# Patient Record
Sex: Male | Born: 1945 | Race: White | Hispanic: No | Marital: Married | Smoking: Former smoker
Health system: Southern US, Community
[De-identification: ages and names within clinical notes are randomized; demographics above are authoritative.]

## PROBLEM LIST (undated history)

## (undated) DIAGNOSIS — M1990 Unspecified osteoarthritis, unspecified site: Secondary | ICD-10-CM

## (undated) DIAGNOSIS — E785 Hyperlipidemia, unspecified: Secondary | ICD-10-CM

## (undated) DIAGNOSIS — M720 Palmar fascial fibromatosis [Dupuytren]: Secondary | ICD-10-CM

## (undated) DIAGNOSIS — R1310 Dysphagia, unspecified: Secondary | ICD-10-CM

## (undated) DIAGNOSIS — R0600 Dyspnea, unspecified: Secondary | ICD-10-CM

## (undated) DIAGNOSIS — Z9289 Personal history of other medical treatment: Secondary | ICD-10-CM

## (undated) DIAGNOSIS — E538 Deficiency of other specified B group vitamins: Secondary | ICD-10-CM

## (undated) DIAGNOSIS — T7840XA Allergy, unspecified, initial encounter: Secondary | ICD-10-CM

## (undated) DIAGNOSIS — C4491 Basal cell carcinoma of skin, unspecified: Secondary | ICD-10-CM

## (undated) DIAGNOSIS — Z931 Gastrostomy status: Secondary | ICD-10-CM

## (undated) DIAGNOSIS — K219 Gastro-esophageal reflux disease without esophagitis: Secondary | ICD-10-CM

## (undated) DIAGNOSIS — C169 Malignant neoplasm of stomach, unspecified: Secondary | ICD-10-CM

## (undated) DIAGNOSIS — R9431 Abnormal electrocardiogram [ECG] [EKG]: Secondary | ICD-10-CM

## (undated) DIAGNOSIS — Z923 Personal history of irradiation: Secondary | ICD-10-CM

## (undated) DIAGNOSIS — Z85828 Personal history of other malignant neoplasm of skin: Secondary | ICD-10-CM

## (undated) DIAGNOSIS — C61 Malignant neoplasm of prostate: Secondary | ICD-10-CM

## (undated) DIAGNOSIS — R011 Cardiac murmur, unspecified: Secondary | ICD-10-CM

## (undated) DIAGNOSIS — H269 Unspecified cataract: Secondary | ICD-10-CM

## (undated) DIAGNOSIS — K269 Duodenal ulcer, unspecified as acute or chronic, without hemorrhage or perforation: Secondary | ICD-10-CM

## (undated) DIAGNOSIS — C439 Malignant melanoma of skin, unspecified: Secondary | ICD-10-CM

## (undated) DIAGNOSIS — D649 Anemia, unspecified: Secondary | ICD-10-CM

## (undated) HISTORY — DX: Unspecified cataract: H26.9

## (undated) HISTORY — DX: Palmar fascial fibromatosis (dupuytren): M72.0

## (undated) HISTORY — DX: Hyperlipidemia, unspecified: E78.5

## (undated) HISTORY — PX: VASECTOMY: SHX75

## (undated) HISTORY — DX: Abnormal electrocardiogram (ECG) (EKG): R94.31

## (undated) HISTORY — PX: ROTATOR CUFF REPAIR: SHX139

## (undated) HISTORY — PX: KNEE ARTHROSCOPY: SUR90

## (undated) HISTORY — PX: CATARACT EXTRACTION, BILATERAL: SHX1313

## (undated) HISTORY — PX: GASTRECTOMY: SHX58

## (undated) HISTORY — DX: Deficiency of other specified B group vitamins: E53.8

## (undated) HISTORY — PX: UPPER GASTROINTESTINAL ENDOSCOPY: SHX188

## (undated) HISTORY — DX: Duodenal ulcer, unspecified as acute or chronic, without hemorrhage or perforation: K26.9

## (undated) HISTORY — PX: COLONOSCOPY: SHX174

---

## 1970-10-06 DIAGNOSIS — K269 Duodenal ulcer, unspecified as acute or chronic, without hemorrhage or perforation: Secondary | ICD-10-CM

## 1970-10-06 HISTORY — DX: Duodenal ulcer, unspecified as acute or chronic, without hemorrhage or perforation: K26.9

## 1996-10-06 HISTORY — PX: TRIGGER FINGER RELEASE: SHX641

## 2004-12-17 ENCOUNTER — Ambulatory Visit: Payer: Self-pay | Admitting: Gastroenterology

## 2005-01-03 ENCOUNTER — Ambulatory Visit: Payer: Self-pay | Admitting: Gastroenterology

## 2007-02-24 ENCOUNTER — Encounter: Payer: Self-pay | Admitting: Internal Medicine

## 2007-02-24 ENCOUNTER — Ambulatory Visit: Payer: Self-pay | Admitting: Internal Medicine

## 2007-03-04 ENCOUNTER — Ambulatory Visit: Payer: Self-pay | Admitting: Internal Medicine

## 2007-03-09 ENCOUNTER — Encounter: Payer: Self-pay | Admitting: Internal Medicine

## 2007-03-11 ENCOUNTER — Encounter: Payer: Self-pay | Admitting: Internal Medicine

## 2007-03-25 ENCOUNTER — Ambulatory Visit: Payer: Self-pay

## 2007-03-25 ENCOUNTER — Encounter: Payer: Self-pay | Admitting: Internal Medicine

## 2007-04-29 ENCOUNTER — Ambulatory Visit: Payer: Self-pay | Admitting: Internal Medicine

## 2007-07-13 ENCOUNTER — Ambulatory Visit: Payer: Self-pay | Admitting: Internal Medicine

## 2007-08-23 ENCOUNTER — Ambulatory Visit: Payer: Self-pay | Admitting: Internal Medicine

## 2007-08-23 DIAGNOSIS — E782 Mixed hyperlipidemia: Secondary | ICD-10-CM

## 2008-09-19 ENCOUNTER — Ambulatory Visit: Payer: Self-pay | Admitting: Internal Medicine

## 2008-11-27 ENCOUNTER — Ambulatory Visit: Payer: Self-pay | Admitting: Internal Medicine

## 2008-11-27 LAB — CONVERTED CEMR LAB
AST: 23 units/L (ref 0–37)
Albumin: 4 g/dL (ref 3.5–5.2)
Alkaline Phosphatase: 52 units/L (ref 39–117)
BUN: 13 mg/dL (ref 6–23)
Bilirubin, Direct: 0.1 mg/dL (ref 0.0–0.3)
Chloride: 106 meq/L (ref 96–112)
Eosinophils Absolute: 0.1 10*3/uL (ref 0.0–0.7)
Eosinophils Relative: 2.3 % (ref 0.0–5.0)
GFR calc Af Amer: 126 mL/min
GFR calc non Af Amer: 104 mL/min
HDL: 43.1 mg/dL (ref >39.0)
MCV: 89.5 fL (ref 78.0–100.0)
Monocytes Relative: 8.7 % (ref 3.0–12.0)
Neutrophils Relative %: 55.3 % (ref 43.0–77.0)
Platelets: 144 10*3/uL — ABNORMAL LOW (ref 150–400)
Potassium: 4.2 meq/L (ref 3.5–5.1)
RDW: 13.1 % (ref 11.5–14.6)
Sodium: 140 meq/L (ref 135–145)
Total Bilirubin: 1 mg/dL (ref 0.3–1.2)
Total CHOL/HDL Ratio: 3.5
Triglycerides: 72 mg/dL (ref 0–149)
VLDL: 14 mg/dL (ref 0–40)
WBC: 5.4 10*3/uL (ref 4.5–10.5)

## 2008-12-04 ENCOUNTER — Ambulatory Visit: Payer: Self-pay | Admitting: Internal Medicine

## 2008-12-04 DIAGNOSIS — R7309 Other abnormal glucose: Secondary | ICD-10-CM

## 2008-12-04 DIAGNOSIS — Z86010 Personal history of colonic polyps: Secondary | ICD-10-CM

## 2008-12-04 DIAGNOSIS — R030 Elevated blood-pressure reading, without diagnosis of hypertension: Secondary | ICD-10-CM

## 2008-12-04 LAB — CONVERTED CEMR LAB
Cholesterol, target level: 200 mg/dL
HDL goal, serum: 40 mg/dL
LDL Goal: 100 mg/dL

## 2008-12-05 ENCOUNTER — Encounter: Payer: Self-pay | Admitting: Internal Medicine

## 2009-09-24 ENCOUNTER — Ambulatory Visit: Payer: Self-pay | Admitting: Family

## 2009-12-21 ENCOUNTER — Ambulatory Visit: Payer: Self-pay | Admitting: Internal Medicine

## 2009-12-21 DIAGNOSIS — R9431 Abnormal electrocardiogram [ECG] [EKG]: Secondary | ICD-10-CM | POA: Insufficient documentation

## 2009-12-21 DIAGNOSIS — R42 Dizziness and giddiness: Secondary | ICD-10-CM | POA: Insufficient documentation

## 2009-12-21 DIAGNOSIS — H9190 Unspecified hearing loss, unspecified ear: Secondary | ICD-10-CM | POA: Insufficient documentation

## 2010-04-01 ENCOUNTER — Ambulatory Visit: Payer: Self-pay | Admitting: Family Medicine

## 2010-04-01 DIAGNOSIS — J069 Acute upper respiratory infection, unspecified: Secondary | ICD-10-CM | POA: Insufficient documentation

## 2010-04-03 ENCOUNTER — Telehealth: Payer: Self-pay | Admitting: Family Medicine

## 2010-04-15 ENCOUNTER — Ambulatory Visit: Payer: Self-pay | Admitting: Internal Medicine

## 2010-11-03 LAB — CONVERTED CEMR LAB
AST: 19 units/L (ref 0–37)
Albumin: 4.1 g/dL (ref 3.5–5.2)
Alkaline Phosphatase: 54 units/L (ref 39–117)
BUN: 11 mg/dL (ref 6–23)
Basophils Absolute: 0 10*3/uL (ref 0.0–0.1)
Basophils Relative: 0.5 % (ref 0.0–1.0)
Basophils Relative: 0.8 % (ref 0.0–3.0)
Bilirubin, Direct: 0.1 mg/dL (ref 0.0–0.3)
Bilirubin, Direct: 0.2 mg/dL (ref 0.0–0.3)
CO2: 30 meq/L (ref 19–32)
CO2: 30 meq/L (ref 19–32)
Calcium: 8.8 mg/dL (ref 8.4–10.5)
Chloride: 106 meq/L (ref 96–112)
Chloride: 107 meq/L (ref 96–112)
Cholesterol: 149 mg/dL (ref 0–200)
Creatinine, Ser: 0.6 mg/dL (ref 0.4–1.5)
Creatinine, Ser: 0.8 mg/dL (ref 0.4–1.5)
Eosinophils Absolute: 0.1 10*3/uL (ref 0.0–0.7)
Eosinophils Relative: 1.8 % (ref 0.0–5.0)
Glucose, Bld: 109 mg/dL — ABNORMAL HIGH (ref 70–99)
Glucose, Bld: 81 mg/dL (ref 70–99)
HCT: 45.2 % (ref 39.0–52.0)
HDL: 45.6 mg/dL (ref >39.00)
Lymphocytes Relative: 30.6 % (ref 12.0–46.0)
MCHC: 33.8 g/dL (ref 30.0–36.0)
MCV: 89.8 fL (ref 78.0–100.0)
MCV: 91.1 fL (ref 78.0–100.0)
Monocytes Absolute: 0.4 10*3/uL (ref 0.1–1.0)
Neutrophils Relative %: 56.6 % (ref 43.0–77.0)
Neutrophils Relative %: 57.1 % (ref 43.0–77.0)
PSA: 1.23 ng/mL (ref 0.10–4.00)
PSA: 1.85 ng/mL (ref 0.10–4.00)
RBC: 5.03 M/uL (ref 4.22–5.81)
RDW: 12.8 % (ref 11.5–14.6)
RDW: 13.3 % (ref 11.5–14.6)
Sodium: 141 meq/L (ref 135–145)
Total Bilirubin: 0.9 mg/dL (ref 0.3–1.2)
Total Protein: 6.7 g/dL (ref 6.0–8.3)
Triglycerides: 46 mg/dL (ref 0.0–149.0)
WBC: 5.3 10*3/uL (ref 4.5–10.5)

## 2010-11-05 NOTE — Assessment & Plan Note (Signed)
Summary: cpx/kdc   Vital Signs:  Patient profile:   65 year old male Weight:      211 pounds Temp:     98.5 degrees F oral Pulse rate:   68 / minute Resp:     16 per minute BP sitting:   110 / 78  (left arm)  Vitals Entered By: Jeremy Johann CMA (December 21, 2009 1:06 PM) CC: cpx,fasting Comments REVIEWED MED LIST, PATIENT AGREED DOSE AND INSTRUCTION CORRECT    CC:  cpx and fasting.  History of Present Illness: Mr . James [name] is here for a physical; he has occasional lightheadedness while sitting @ desk , lasting 3-4 seconds, never with exertion. Preventive Health Care Interventions discussed.  Allergies: 1)  ! Codeine  Past History:  Past Medical History: DUD with bleed 1969; Hyperlipidemia: NMR 2008: LDL 148(1859/1154), HDL 45, TG 93. LDL goal = < 110, ideally < 75 Colonic polyps, hx of NS ST -T EKG changes; NEGATIVE Nuclear Stress Test 03/2007  Past Surgical History: trigger finger release surgery 1998; Rotator cuff repair arthroscopy of R  knee Colon polypectomy X 1, [facility]   Family History: bro: MM;F DM,MI @ 76,?  liver issues related to alcohol; M alcoholism  Social History: 3-5 drinks/week;2-3 cups coffee/QD;occa tea;diet cola 1-2/day;Advil 2 every other day; Occupation:Business Owner Former Smoker, quit 1968 Regular exercise-yes: tennis 3X/week [employer]; graduate of [employer]  Review of Systems General:  Denies chills, fatigue, fever, sleep disorder, and sweats. Eyes:  Denies blurring, double vision, and vision loss-both eyes; Cataract surgery to be scheduled by Dr Nile Riggs. ENT:  Complains of decreased hearing; denies difficulty swallowing and hoarseness. CV:  Denies bluish discoloration of lips or nails, chest pain or discomfort, difficulty breathing at night, difficulty breathing while lying down, fatigue, leg cramps with exertion, near fainting, palpitations, shortness of breath with exertion, and swelling of hands; Occasional edema. Resp:   Denies cough, shortness of breath, sputum productive, and wheezing. GI:  Denies abdominal pain, bloody stools, dark tarry stools, and indigestion. GU:  Denies discharge, dysuria, and hematuria. MS:  Denies joint pain, joint redness, joint swelling, low back pain, mid back pain, and thoracic pain. Derm:  Denies changes in nail beds, dryness, hair loss, and lesion(s). Neuro:  Denies brief paralysis, disturbances in coordination, falling down, headaches, numbness, poor balance, sensation of room spinning, tingling, visual disturbances, and weakness. Psych:  Denies anxiety and depression. Endo:  Denies cold intolerance, excessive hunger, excessive thirst, excessive urination, and heat intolerance. Heme:  Denies abnormal bruising. Allergy:  Complains of itching eyes, seasonal allergies, and sneezing; OTC meds as needed .  Physical Exam  General:  well-nourished; alert,appropriate and cooperative throughout examination Head:  Normocephalic and atraumatic without obvious abnormalities. No apparent alopecia Eyes:  No corneal or conjunctival inflammation noted.  Perrla. Funduscopic exam benign, without hemorrhages, exudates or papilledema. Cataract OD  Ears:  External ear exam shows no significant lesions or deformities.  Otoscopic examination reveals clear canals, tympanic membranes are intact bilaterally without bulging, retraction, inflammation or discharge. Hearing is grossly  decreased Nose:  External nasal examination shows no deformity or inflammation. Nasal mucosa are pink and moist without lesions or exudates. Mouth:  Oral mucosa and oropharynx without lesions or exudates.  Teeth in good repair. Neck:  No deformities, masses, or tenderness noted. Lungs:  Normal respiratory effort, chest expands symmetrically. Lungs are clear to auscultation, no crackles or wheezes. Heart:  normal rate. Occasional premature beat & intermittent  grade 1 /6  systolic murmur @ apex .   Abdomen:  Bowel sounds  positive,abdomen soft and non-tender without masses, organomegaly or hernias noted. Rectal:  No external abnormalities noted. Normal sphincter tone. No rectal masses or tenderness. Genitalia:  Testes bilaterally descended without nodularity, tenderness or masses. No scrotal masses or lesions. No penis lesions or urethral discharge. Prostate:  Prostate gland firm and smooth, no enlargement, nodularity, tenderness, mass, asymmetry or induration. Msk:  No deformity or scoliosis noted of thoracic or lumbar spine.   Pulses:  R and L carotid,radial,dorsalis pedis and posterior tibial pulses are full and equal bilaterally Extremities:  No clubbing, cyanosis, edema, or deformity noted with normal full range of motion of all joints.   Neurologic:  alert & oriented X3 and DTRs symmetrical and normal.   Skin:  Solar changes Cervical Nodes:  No lymphadenopathy noted Axillary Nodes:  No palpable lymphadenopathy Psych:  memory intact for recent and remote, normally interactive, and good eye contact.     Impression & Recommendations:  Problem # 1:  ROUTINE GENERAL MEDICAL EXAM@HEALTH  CARE FACL (ICD-V70.0)  Orders: EKG w/ Interpretation (93000) Venipuncture (16109) TLB-Lipid Panel (80061-LIPID) TLB-BMP (Basic Metabolic Panel-BMET) (80048-METABOL) TLB-CBC Platelet - w/Differential (85025-CBCD) TLB-Hepatic/Liver Function Pnl (80076-HEPATIC) TLB-TSH (Thyroid Stimulating Hormone) (84443-TSH) TLB-PSA (Prostate Specific Antigen) (84153-PSA)  Problem # 2:  DIZZINESS (ICD-780.4)  @ rest ; in context of PACs & NS ST-T changes  and increased stimulants  Orders: Venipuncture (60454)  Problem # 3:  HEARING DEFICIT (ICD-389.9)  Problem # 4:  HYPERLIPIDEMIA (ICD-272.2)  His updated medication list for this problem includes:    Pravastatin Sodium 40 Mg Tabs (Pravastatin sodium) .[name]... 1 qhs  Orders: Venipuncture (09811) TLB-Lipid Panel (80061-LIPID)  Problem # 5:  COLONIC POLYPS, HX OF (ICD-V12.72)    F/U as per [facility]  Orders: Venipuncture (91478)  Problem # 6:  NONSPECIFIC ABNORMAL ELECTROCARDIOGRAM (ICD-794.31) minimal progression vs 2008 Orders: EKG w/ Interpretation (93000)  Complete Medication List: 1)  Pravastatin Sodium 40 Mg Tabs (Pravastatin sodium) .[name].. 1 qhs  Other Orders: Tdap => 43yrs IM (29562) Admin 1st Vaccine (13086) Admin 1st Vaccine [facility]) [id number])  Patient Instructions: 1)  Slowly wean stimulants as discussed. 2)  Schedule a colonoscopy as per [facility]  to help detect colon cancer.   Tetanus/Td Vaccine    Vaccine Type: Tdap    Site: right deltoid    Mfr: GlaxoSmithKline    Dose: 0.5 ml    Route: IM    Given by: Jeremy Johann CMA    Exp. Date: 12/01/2011    Lot #: [id number]    VIS given: 08/24/07 version given December 21, 2009.

## 2010-11-05 NOTE — Progress Notes (Signed)
Summary: James [name]  Phone Note Call from Patient Call back at [facility] Phone [phone]   Caller: Patient Summary of Call: PT WAS SEEN BY DR Beverely Low ON MONDAY. WAS TOLD TO CALL BACK IF SYPTOMS DIDNT IMPROVE. HE NOW STATES THAT HE IS  WORSE.HE COMPLAINS OF DIZZINESS,CONGESTION, SORE THROAT,CANT EAT,HEADACHE,CANT SLEEP. PLEASE CONTACT PATIENT AT [phone]. Initial call taken by: Lavell Islam,  April 03, 2010 8:25 AM  Follow-up for Phone Call        left message to call office......[name] KitchenFelecia Deloach CMA  April 03, 2010 12:06 PM   Additional Follow-up for Phone Call Additional follow up Details #1::        sxs still sound consistent w/ viral illness but given new onset dizziness and HA along w/ congestion may be sinus infxn.  if dizziness and HA severe will need evaluation either here or at [facility] or ER.  also need more info as to why pt can't sleep and eat.  if pt can clarify some of his sxs will consider calling in abx.  if sxs remain vague will need OV. Additional Follow-up by: Neena Rhymes MD,  April 03, 2010 12:17 PM    Additional Follow-up for Phone Call Additional follow up Details #2::    spoke to pt, pt c/o dull HA, sore throat, and a cough. Pt denies any fever, SOB, or dizziness. pt state that due to sore throat he unable able to swallow food which has cause him not to be able to eat solid foods. Pt also states that he is having difficulty sleeping due the cough which is keeping and awaking him up at night...........................[name] KitchenFelecia Deloach CMA  April 03, 2010 12:40 PM    Additional Follow-up for Phone Call Additional follow up Details #3:: Details for Additional Follow-up Action Taken: pt is allergic to codeine so other than Delsym and Tessalon (which he already has) there isn't much to do for the cough.  Sore throat will improve w/ regular use of ibuprofen (will also help HA).  if he's not having facial pain/tooth pain it is unlikely to be sinus infxn.  at this point he should  continue the cough meds he has, use the ibuprofen regularly.  it has only been 48 hrs since he was seen and 5 days since sxs first started.  still likely viral.  should start to improve soon and if not will address the antibiotic decision at that time (typically 7 days or more)  pt aware.....................[name] KitchenFelecia Deloach CMA  April 03, 2010 1:22 PM  Additional Follow-up by: Neena Rhymes MD,  April 03, 2010 1:09 PM

## 2010-11-05 NOTE — Assessment & Plan Note (Signed)
Summary: SHINGLES SHOT--PER CHRAE, HAS ONE WITH HIS NAME ON IT///SPH  Nurse Visit   Allergies: 1)  ! Codeine  Immunizations Administered:  Zostavax # 1:    Vaccine Type: Zostavax    Site: Left Arm    Mfr: Merck    Dose: 0.62mL    Route: Arthur    Given by: Shonna Chock    Exp. Date: 05/01/2011    Lot #: [id number]    VIS given: 07/18/05 given April 15, 2010.  Orders Added: 1)  Zoster (Shingles) Vaccine Live [90736] 2)  Admin 1st Vaccine [phone]

## 2010-11-05 NOTE — Assessment & Plan Note (Signed)
Summary: chest cold//kn   Vital Signs:  Patient profile:   65 year old male Weight:      209 pounds O2 Sat:      93 % on Room air Temp:     97.0 degrees F oral Pulse rate:   62 / minute BP sitting:   116 / 76  (left arm)  Vitals Entered By: Doristine Devoid (April 01, 2010 11:12 AM)  O2 Flow:  Room air CC: cough and chest congestion xsat. no appetite    History of Present Illness: 65 yo man here today w/ cough and chest congestion.  sxs started on Saturday after spending time w/ sick contact.  no fevers.  + sore throat.  + hacking cough 'all night, can't sleep'.  chest discomfort w/ coughing.  + fatigue, 'i feel like doo-doo'.  cough is intermittantly productive- 'a good shade of green'.  no ear pain, mild facial pressure.  + HA.  Taking Mucinex DM.  Current Medications (verified): 1)  Pravastatin Sodium 40 Mg  Tabs (Pravastatin Sodium) .[name].. 1 Qhs  Allergies (verified): 1)  ! Codeine  Review of Systems      See HPI  Physical Exam  General:  well-nourished; alert,appropriate and cooperative throughout examination Head:  Normocephalic and atraumatic without obvious abnormalities. No apparent alopecia.  no TTP over sinuses Eyes:  no injxn or inflammation Ears:  External ear exam shows no significant lesions or deformities.  Otoscopic examination reveals clear canals, tympanic membranes are intact bilaterally without bulging, retraction, inflammation or discharge. Hearing is grossly  decreased Nose:  clear rhinorrhea Mouth:  Oral mucosa and oropharynx without lesions or exudates. Neck:  No deformities, masses, or tenderness noted. Lungs:  Normal respiratory effort, chest expands symmetrically. Lungs are clear to auscultation, no crackles or wheezes.  1 dry cough during visit Heart:  normal rate. grade 1 /6 systolic murmur @ apex .   Cervical Nodes:  No lymphadenopathy noted   Impression & Recommendations:  Problem # 1:  URI (ICD-465.9) Assessment New  pt's sxs consistent w/  viral illness.  no bacterial infxn seen on exam.  reviewed supportive care and red flags that should prompt return.  Pt expresses understanding and is in agreement w/ this plan. His updated medication list for this problem includes:    Tessalon 200 Mg Caps (Benzonatate) .[name]... Take one capsule by mouth three times a day as needed for cough  Orders: Prescription Created Electronically [phone])  Complete Medication List: 1)  Pravastatin Sodium 40 Mg Tabs (Pravastatin sodium) .[name].. 1 qhs 2)  Tessalon 200 Mg Caps (Benzonatate) .... Take one capsule by mouth three times a day as needed for cough  Patient Instructions: 1)  Please call if no improvement by Friday, or if at anytime worsening 2)  Continue the Mucinex DM, add the Tessalon for cough 3)  Tylenol/ibuprofen as needed for pain or fever 4)  Drink plenty of fluids and get lots of rest 5)  Call with any questions or concerns 6)  Hang in there! Prescriptions: TESSALON 200 MG CAPS (BENZONATATE) Take one capsule by mouth three times a day as needed for cough  #60 x 0   Entered and Authorized by:   Neena Rhymes MD   Signed by:   Neena Rhymes MD on 04/01/2010   Method used:   Electronically to        [facility] [address] [id number]* (retail)       [address]  [place], [place]  [address]       Ph: [phone]       Fax: [phone]   RxID:   [id number]

## 2010-11-06 ENCOUNTER — Encounter: Payer: Self-pay | Admitting: Internal Medicine

## 2010-12-27 ENCOUNTER — Ambulatory Visit (INDEPENDENT_AMBULATORY_CARE_PROVIDER_SITE_OTHER): Payer: Medicare Other | Admitting: Internal Medicine

## 2010-12-27 ENCOUNTER — Encounter: Payer: Self-pay | Admitting: Internal Medicine

## 2010-12-27 VITALS — BP 106/70 | HR 60 | Temp 97.5°F | Resp 14 | Ht 67.5 in | Wt 214.0 lb

## 2010-12-27 DIAGNOSIS — Z23 Encounter for immunization: Secondary | ICD-10-CM

## 2010-12-27 DIAGNOSIS — E785 Hyperlipidemia, unspecified: Secondary | ICD-10-CM

## 2010-12-27 DIAGNOSIS — Z0000 Encounter for general adult medical examination without abnormal findings: Secondary | ICD-10-CM

## 2010-12-27 DIAGNOSIS — D126 Benign neoplasm of colon, unspecified: Secondary | ICD-10-CM

## 2010-12-27 DIAGNOSIS — R9431 Abnormal electrocardiogram [ECG] [EKG]: Secondary | ICD-10-CM

## 2010-12-27 LAB — HEPATIC FUNCTION PANEL
ALT: 12 U/L (ref 0–53)
AST: 24 U/L (ref 0–37)
Albumin: 4.3 g/dL (ref 3.5–5.2)
Alkaline Phosphatase: 50 U/L (ref 39–117)
Total Bilirubin: 0.8 mg/dL (ref 0.3–1.2)

## 2010-12-27 LAB — BASIC METABOLIC PANEL
Calcium: 9 mg/dL (ref 8.4–10.5)
Creatinine, Ser: 0.8 mg/dL (ref 0.4–1.5)
GFR: 99.89 mL/min (ref >60.00)
Sodium: 142 mEq/L (ref 135–145)

## 2010-12-27 LAB — CBC WITH DIFFERENTIAL/PLATELET
Basophils Absolute: 0 10*3/uL (ref 0.0–0.1)
HCT: 44.7 % (ref 39.0–52.0)
Lymphs Abs: 1.9 10*3/uL (ref 0.7–4.0)
MCV: 89.7 fl (ref 78.0–100.0)
Monocytes Absolute: 0.5 10*3/uL (ref 0.1–1.0)
Neutrophils Relative %: 53.5 % (ref 43.0–77.0)
Platelets: 158 10*3/uL (ref 150.0–400.0)
RDW: 13.9 % (ref 11.5–14.6)
WBC: 5.3 10*3/uL (ref 4.5–10.5)

## 2010-12-27 LAB — LIPID PANEL
HDL: 43.2 mg/dL (ref >39.00)
Total CHOL/HDL Ratio: 4
Triglycerides: 45 mg/dL (ref 0.0–149.0)

## 2010-12-27 LAB — TSH: TSH: 2.5 u[IU]/mL (ref 0.35–5.50)

## 2010-12-27 NOTE — Progress Notes (Signed)
  Subjective:    Patient ID: James [name], male    DOB: 1946/01/24, 65 y.o.   MRN: [id number]  HPI    Review of Systems     Objective:   Physical Exam        Assessment & Plan:

## 2010-12-27 NOTE — Progress Notes (Signed)
  Subjective:    Patient ID: James [name], male    DOB: 1946-06-06, 65 y.o.   MRN: [id number]  HPI  James [name] is here for a Medicare Wellness Exam; Diagnoses reviewed & chart updated.    Review of Systems     Objective:   Physical Exam        Assessment & Plan:

## 2010-12-27 NOTE — Progress Notes (Signed)
  Subjective:    Patient ID: James [name], male    DOB: March 24, 1946, 65 y.o.   MRN: [id number]  HPI he is here for his Medicare wellness visit; he is essentially asymptomatic. He has recovered from a recent respiratory tract infection which was associated with a prolonged cough.    the Medicare wellness questionnaire was reviewed. Diagnoses were reviewed and the chart was updated.   Social data includes congestion of less than one alcoholic beverage daily. He is an ex-smoker having smoked for 5 years during the period 1964-1969. He smoked less than one pack per day.  His last foreign travel was to [place] in 2002. Significantly he was in the in 1968 - 68.  He drinks 2-3 cups of coffee a day and 3 diet Cokes a day. Caffeine as a potential trigger for reflux was discussed.   he exercises at least 3 times a week playing tennis and golf ; this encompasses greater than 60 minutes per session.   He has no limitations to activities of daily living. Has no fall risk or balance issues.    he has noted decreased auditory acuity particularly in crowded rooms. On direct testing hearing was decreased to whisper at 6 feet  Bilaterally.   Audiology assessment is being considered ; clinically this is indicated.   mental status testing was completed. He is oriented x3 ; memory and recall is excellent; mood and affect are normal.  He denies any significant depression.    Review of Systems  Constitutional: Negative for fever, activity change, fatigue and unexpected weight change.  HENT: Negative for congestion, sore throat and postnasal drip.   Eyes: Negative for visual disturbance.  Respiratory: Negative for cough, chest tightness, shortness of breath and wheezing.   Cardiovascular: Negative for chest pain, palpitations and leg swelling.  Gastrointestinal: Negative for abdominal pain, diarrhea, constipation, blood in stool and anal bleeding.  Genitourinary: Negative for dysuria and hematuria.    Musculoskeletal: Negative for myalgias and arthralgias.  Skin: Negative for rash.  Neurological: Negative for dizziness, syncope, light-headedness and numbness.  Hematological: Negative for adenopathy. Does not bruise/bleed easily.  Psychiatric/Behavioral: Negative.    despite the recent pressure for infection he has no active respiratory symptoms related to the upper or lower respiratory tracts.     Objective:   Physical Exam  Constitutional: He appears well-nourished.  HENT:  Head: Normocephalic.  Mouth/Throat: Oropharynx is clear and moist.        There is mild septal deviation to the right. As noted there is decreased auditory acuity to whisper at 6 feet bilaterally.  Eyes: Conjunctivae are normal. Pupils are equal, round, and reactive to light.  Neck: No thyromegaly present.  Cardiovascular: Normal rate, regular rhythm and intact distal pulses.  Exam reveals no gallop and no friction rub.   Murmur heard.       A grade 1 systolic murmur is present ; the rhythm is minimally irregular  Pulmonary/Chest: Effort normal and breath sounds normal.  Abdominal: Soft. He exhibits no distension and no mass. There is no tenderness.  Genitourinary: Rectum normal, prostate normal and penis normal.  Musculoskeletal: Normal range of motion. He exhibits no edema.  Lymphadenopathy:    He has no cervical adenopathy.  Neurological: He is alert. He has normal reflexes.  Skin: Skin is warm and dry. No rash noted.  Psychiatric: He has a normal mood and affect. Thought content normal.          Assessment & Plan:

## 2010-12-27 NOTE — Progress Notes (Signed)
  Subjective:    Patient ID: James [name], male    DOB: 16-Sep-1946, 65 y.o.   MRN: [id number]  HPI    Review of Systems     Objective:   Physical Exam        Assessment & Plan:   #1 Medicare medical wellness exam reveals no significant issues    #2 decreased auditory acuity ; audiology referral is appropriate if desired.    Plan : additional recommendations pending return of  laboratory studies.

## 2010-12-27 NOTE — Progress Notes (Signed)
  Subjective:    Patient ID: James [name], male    DOB: 07/21/1946, 65 y.o.   MRN: [id number]  HPI    Review of Systems     Objective:   Physical Exam        Assessment & Plan:   

## 2010-12-27 NOTE — Progress Notes (Signed)
  Subjective:    Patient ID: James [name], male    DOB: 03/18/1946, 65 y.o.   MRN: [id number]  HPI    Review of Systems     Objective:   Physical Exam        Assessment & Plan:   

## 2010-12-27 NOTE — Progress Notes (Signed)
  Subjective:    Patient ID: James [name], male    DOB: 12/27/1945, 65 y.o.   MRN: [id number]  HPI    Review of Systems     Objective:   Physical Exam        Assessment & Plan:   

## 2010-12-27 NOTE — Patient Instructions (Signed)
Please call if referral needed to see   Audiologist.    additional recommendations are pending the outstanding lab results

## 2010-12-27 NOTE — Progress Notes (Signed)
  Subjective:    Patient ID: James [name], male    DOB: 1946/05/24, 65 y.o.   MRN: [id number]  HPI    Review of Systems     Objective:   Physical Exam  Skin:        There are scattered keratoses over his back ; no suspicious lesions are present.          Assessment & Plan:

## 2011-02-21 ENCOUNTER — Other Ambulatory Visit: Payer: Self-pay | Admitting: Internal Medicine

## 2011-02-21 NOTE — Telephone Encounter (Signed)
Lipid/Hep 272.4/995.20  

## 2011-02-27 ENCOUNTER — Telehealth: Payer: Self-pay | Admitting: Internal Medicine

## 2011-02-27 MED ORDER — PRAVASTATIN SODIUM 40 MG PO TABS: 40.0000 mg | ORAL_TABLET | Freq: Every day | ORAL | Status: DC

## 2011-02-27 NOTE — Telephone Encounter (Signed)
No additional labs needed, resubmitted rx for year supply

## 2011-03-04 ENCOUNTER — Other Ambulatory Visit: Payer: Self-pay | Admitting: Otolaryngology

## 2011-03-14 ENCOUNTER — Other Ambulatory Visit: Payer: Self-pay | Admitting: Dermatology

## 2011-03-14 ENCOUNTER — Ambulatory Visit
Admission: RE | Admit: 2011-03-14 | Discharge: 2011-03-14 | Disposition: A | Discharge status: Home / Self Care | Payer: Medicare Other | Source: Ambulatory Visit | Attending: Otolaryngology | Admitting: Otolaryngology

## 2011-09-12 ENCOUNTER — Encounter: Payer: Self-pay | Admitting: Family Medicine

## 2011-09-12 ENCOUNTER — Ambulatory Visit (INDEPENDENT_AMBULATORY_CARE_PROVIDER_SITE_OTHER): Payer: Medicare Other | Admitting: Family Medicine

## 2011-09-12 VITALS — BP 114/76 | HR 65 | Temp 99.1°F | Wt 214.0 lb

## 2011-09-12 DIAGNOSIS — J40 Bronchitis, not specified as acute or chronic: Secondary | ICD-10-CM

## 2011-09-12 MED ORDER — AZITHROMYCIN 250 MG PO TABS: ORAL_TABLET | ORAL | Status: AC

## 2011-09-12 NOTE — Patient Instructions (Signed)

## 2011-09-12 NOTE — Progress Notes (Signed)
  Subjective:     James [name] is a 65 y.o. male here for evaluation of a cough. Onset of symptoms was 2 weeks ago. Symptoms have been gradually worsening since that time. The cough is productive and is aggravated by exercise and reclining position. Associated symptoms include: sputum production and wheezing. Patient does not have a history of asthma. Patient does not have a history of environmental allergens. Patient has not traveled recently. Patient does have a history of smoking. Patient has not had a previous chest x-ray. Patient has not had a PPD done.  The following portions of the patient's history were reviewed and updated as appropriate: allergies, current medications, past family history, past medical history, past social history, past surgical history and problem list.  Review of Systems Pertinent items are noted in HPI.    Objective:    Oxygen saturation 95% on room air BP 114/76  Pulse 65  Temp(Src) 99.1 F (37.3 C) (Oral)  Wt 214 lb (97.07 kg)  SpO2 95% General appearance: alert, cooperative, appears stated age and no distress Ears: normal TM's and external ear canals both ears Nose: Nares normal. Septum midline. Mucosa normal. No drainage or sinus tenderness. Throat: lips, mucosa, and tongue normal; teeth and gums normal Neck: no adenopathy, supple, symmetrical, trachea midline and thyroid not enlarged, symmetric, no tenderness/mass/nodules Lungs: rhonchi bilaterally Heart: regular rate and rhythm, S1, S2 normal, no murmur, click, rub or gallop    Assessment:    Acute Bronchitis    Plan:    Antibiotics per medication orders. Antitussives per medication orders. Avoid exposure to tobacco smoke and fumes. Call if shortness of breath worsens, blood in sputum, change in character of cough, development of fever or chills, inability to maintain nutrition and hydration. Avoid exposure to tobacco smoke and fumes.

## 2012-01-06 ENCOUNTER — Ambulatory Visit (INDEPENDENT_AMBULATORY_CARE_PROVIDER_SITE_OTHER): Payer: Medicare Other | Admitting: Internal Medicine

## 2012-01-06 ENCOUNTER — Encounter: Payer: Self-pay | Admitting: Internal Medicine

## 2012-01-06 VITALS — BP 124/82 | HR 64 | Temp 98.2°F | Resp 12 | Ht 67.75 in | Wt 208.0 lb

## 2012-01-06 DIAGNOSIS — R9431 Abnormal electrocardiogram [ECG] [EKG]: Secondary | ICD-10-CM

## 2012-01-06 DIAGNOSIS — Z860100 Personal history of colon polyps, unspecified: Secondary | ICD-10-CM

## 2012-01-06 DIAGNOSIS — Z0000 Encounter for general adult medical examination without abnormal findings: Secondary | ICD-10-CM

## 2012-01-06 DIAGNOSIS — E782 Mixed hyperlipidemia: Secondary | ICD-10-CM

## 2012-01-06 DIAGNOSIS — R7309 Other abnormal glucose: Secondary | ICD-10-CM

## 2012-01-06 DIAGNOSIS — R030 Elevated blood-pressure reading, without diagnosis of hypertension: Secondary | ICD-10-CM

## 2012-01-06 LAB — HEPATIC FUNCTION PANEL
ALT: 12 U/L (ref 0–53)
Bilirubin, Direct: 0 mg/dL (ref 0.0–0.3)
Total Bilirubin: 0.6 mg/dL (ref 0.3–1.2)

## 2012-01-06 LAB — BASIC METABOLIC PANEL
BUN: 16 mg/dL (ref 6–23)
Calcium: 9 mg/dL (ref 8.4–10.5)
Chloride: 103 mEq/L (ref 96–112)
Creatinine, Ser: 0.8 mg/dL (ref 0.4–1.5)
GFR: 98.19 mL/min (ref >60.00)

## 2012-01-06 LAB — LIPID PANEL
Cholesterol: 147 mg/dL (ref 0–200)
HDL: 50.7 mg/dL (ref >39.00)
Triglycerides: 55 mg/dL (ref 0.0–149.0)
VLDL: 11 mg/dL (ref 0.0–40.0)

## 2012-01-06 LAB — CBC WITH DIFFERENTIAL/PLATELET
Eosinophils Relative: 4.6 % (ref 0.0–5.0)
MCV: 90.9 fl (ref 78.0–100.0)
Monocytes Absolute: 0.5 10*3/uL (ref 0.1–1.0)
Neutrophils Relative %: 48.6 % (ref 43.0–77.0)
Platelets: 155 10*3/uL (ref 150.0–400.0)
WBC: 5.1 10*3/uL (ref 4.5–10.5)

## 2012-01-06 LAB — TSH: TSH: 2.75 u[IU]/mL (ref 0.35–5.50)

## 2012-01-06 NOTE — Progress Notes (Signed)
Subjective:    Patient ID: James [name], male    DOB: December 06, 1945, 66 y.o.   MRN: [id number]  HPI Medicare Wellness Visit:  The following psychosocial & medical history were reviewed as required by Medicare.   Social history: caffeine: 2 cups / day , alcohol:  < 3 /week ,  tobacco use : quit 1969  & exercise : tennis 3 x/ week.   Home & personal  safety / fall risk: no, activities of daily living:no limitations , seatbelt use : yes , and smoke alarm employment : yes .  Power of Attorney/Living Will status : inplace  Vision ( as recorded per Nurse) & Hearing  evaluation :  See exam. Orientation :oriented X 3 , memory & recall :good,  math testing: good,and mood & affect : normal . Depression / anxiety: denied Travel history : [place] immunization status :up to date , transfusion history:  1 unit pc post ulcer bleed, and preventive health surveillance ( colonoscopies, BMD , etc as per protocol/ [facility]): to check on colonoscopy due date, Dental care:  Every 6 mos Chart reviewed &  Updated. Active issues reviewed & addressed.       Review of Systems HYPERTENSION: Disease Monitoring: Blood pressure -120/82 on average Chest pain, palpitations- no       Dyspnea- no Medications: Compliance- yes  Lightheadedness,Syncope- no   Edema-no  FASTING HYPERGLYCEMIA, PMH of: Disease Monitoring: Blood Sugar ranges-not monitored Polyuria/phagia/dipsia- no       Visual problems- no Medications: Compliance-no meds; on low carb    HYPERLIPIDEMIA: Disease Monitoring: See symptoms for Hypertension Medications: Compliance- yes  Abd pain, bowel changes-no Muscle aches- no           Objective:   Physical Exam Gen.: Healthy and well-nourished in appearance. Alert, appropriate and cooperative throughout exam. Head: Normocephalic without obvious abnormalities  Eyes: No corneal or conjunctival inflammation noted. Pupils equal round reactive to light and accommodation. Fundal exam is  benign without hemorrhages, exudate, papilledema. Extraocular motion intact. Vision grossly normal. Ears: External  ear exam reveals no significant lesions or deformities. Canals clear .TMs normal. Hearing is grossly decreased to whisper @ 6 feet  bilaterally. Nose: External nasal exam reveals no deformity or inflammation. Nasal mucosa are pink and moist. No lesions or exudates noted.   Mouth: Oral mucosa and oropharynx reveal no lesions or exudates. Teeth in good repair. Neck: No deformities, masses, or tenderness noted. Range of motion & Thyroid normal Lungs: Normal respiratory effort; chest expands symmetrically. Lungs are clear to auscultation without rales, wheezes, or increased work of breathing. Heart: Normal rate and rhythm. Normal S1 and S2. No gallop, click, or rub.S4 w/o murmur. Abdomen: Bowel sounds normal; abdomen soft and nontender. No masses, organomegaly or hernias noted. Genitalia:normal.Prostate is normal without enlargement, asymmetry, induration, or nodularity.                                                                                  Musculoskeletal/extremities: No deformity or scoliosis noted of  the thoracic or lumbar spine. No clubbing, cyanosis, edema, or deformity noted. Range of motion  normal .Tone & strength  normal.Joints normal. Nail health  good.  Vascular: Carotid, radial artery, dorsalis pedis and  posterior tibial pulses are full and equal. No bruits present. Neurologic: Alert and oriented x3. Deep tendon reflexes symmetrical and normal.          Skin: Intact without suspicious lesions or rashes. Lymph: No cervical, axillary, or inguinal lymphadenopathy present. Psych: Mood and affect are normal. Normally interactive                                                                                         Assessment & Plan:  #1 Medicare Wellness Exam; criteria met ; data entered #2 Problem List reviewed ; Assessment/ Recommendations made Plan: see Orders

## 2012-01-06 NOTE — Patient Instructions (Signed)
Preventive Health Care: Exercise at least 30-45 minutes a day,  3-4 days a week.  Eat a low-fat diet with lots of fruits and vegetables, up to 7-9 servings per day. Consume less than 40 grams of sugar per day from foods & drinks with High Fructose Corn Sugar as # 1,2,3 or # 4 on label. Blood Pressure Goal  Ideally is an AVERAGE < 135/85. This AVERAGE should be calculated from @ least 5-7 BP readings taken @ different times of day on different days of week. You should not respond to isolated BP readings , but rather the AVERAGE for that week As per the Standard of Care , screening Colonoscopy recommended @ 50 & every 5-10 years thereafter . More frequent monitor would be dictated by family history or findings @ Colonoscopy  

## 2012-01-07 ENCOUNTER — Telehealth: Payer: Self-pay | Admitting: *Deleted

## 2012-01-07 ENCOUNTER — Encounter: Payer: Self-pay | Admitting: Gastroenterology

## 2012-01-07 NOTE — Telephone Encounter (Signed)
Message copied by Leonette Monarch on Wed Jan 07, 2012  8:28 AM ------      Message from: Mardella Layman      Created: Tue Jan 06, 2012 12:14 PM       Need prior colon report here      ----- Message -----         From: Pecola Lawless, MD         Sent: 01/06/2012  11:41 AM           To: Mardella Layman, MD            Please verify when follow up colonoscopy is due based on your records

## 2012-01-07 NOTE — Telephone Encounter (Signed)
Pt past due for colonoscopy he was due in 2009 , transferred pt to [facility] to schedule his colonoscopy and pre visit. Colon scheduled for 02/23/2012

## 2012-02-04 ENCOUNTER — Ambulatory Visit (AMBULATORY_SURGERY_CENTER): Payer: Medicare Other | Admitting: *Deleted

## 2012-02-04 VITALS — Ht 68.0 in | Wt 208.0 lb

## 2012-02-04 DIAGNOSIS — Z1211 Encounter for screening for malignant neoplasm of colon: Secondary | ICD-10-CM

## 2012-02-04 HISTORY — PX: OTHER SURGICAL HISTORY: SHX169

## 2012-02-04 MED ORDER — PEG-KCL-NACL-NASULF-NA ASC-C 100 G PO SOLR: ORAL | Status: DC

## 2012-02-23 ENCOUNTER — Ambulatory Visit (AMBULATORY_SURGERY_CENTER): Payer: Medicare Other | Admitting: Gastroenterology

## 2012-02-23 ENCOUNTER — Encounter: Payer: Self-pay | Admitting: Gastroenterology

## 2012-02-23 VITALS — BP 128/82 | HR 61 | Temp 96.6°F | Resp 21 | Ht 68.0 in | Wt 208.0 lb

## 2012-02-23 DIAGNOSIS — D126 Benign neoplasm of colon, unspecified: Secondary | ICD-10-CM

## 2012-02-23 DIAGNOSIS — K5730 Diverticulosis of large intestine without perforation or abscess without bleeding: Secondary | ICD-10-CM

## 2012-02-23 DIAGNOSIS — Z1211 Encounter for screening for malignant neoplasm of colon: Secondary | ICD-10-CM

## 2012-02-23 DIAGNOSIS — Z86010 Personal history of colonic polyps: Secondary | ICD-10-CM

## 2012-02-23 MED ORDER — SODIUM CHLORIDE 0.9 % IV SOLN: 500.0000 mL | INTRAVENOUS | Status: DC

## 2012-02-23 NOTE — Progress Notes (Signed)
Patient did not experience any of the following events: a burn prior to discharge; a fall within the facility; wrong site/side/patient/procedure/implant event; or a hospital transfer or hospital admission upon discharge from the facility. (G8907) Patient did not have preoperative order for IV antibiotic SSI prophylaxis. (G8918)  

## 2012-02-23 NOTE — Op Note (Signed)
[facility] [address] [place], [place]  [address]  COLONOSCOPY PROCEDURE REPORT  PATIENT:  James [name], James [name]  MR#:  [id number] BIRTHDATE:  1946/06/02, 66 yrs. old  GENDER:  male ENDOSCOPIST:  Dex Blakely. Jarold Motto, MD, [facility] REF. BY: PROCEDURE DATE:  02/23/2012 PROCEDURE:  Colonoscopy with snare polypectomy ASA CLASS:  Class II INDICATIONS:  history of pre-cancerous (adenomatous) colon polyps  MEDICATIONS:   propofol (Diprivan) 150 mg IV  DESCRIPTION OF PROCEDURE:   After the risks and benefits and of the procedure were explained, informed consent was obtained. Digital rectal exam was performed and revealed no abnormalities. The LB CF-[id number] endoscope was introduced through the anus and advanced to the cecum, which was identified by both the appendix and ileocecal valve.  The quality of the prep was excellent, using MoviPrep.  The instrument was then slowly withdrawn as the colon was fully examined. <<PROCEDUREIMAGES>>  FINDINGS:  Moderate diverticulosis was found in the sigmoid to descending colon segments. red thickened haustral folds noted.  A sessile polyp was found in the ascending colon. 5 mm flat right colon polyp hot snare removed.jar #1.  A sessile polyp was found in the sigmoid colon. 6 mm vascular sigmoid polyp hot snare removed.jar #2.  This was otherwise a normal examination of the colon.   Retroflexed views in the rectum revealed no abnormalities.    The scope was then withdrawn from the patient and the procedure completed.  COMPLICATIONS:  None ENDOSCOPIC IMPRESSION: 1) Moderate diverticulosis in the sigmoid to descending colon segments 2) Sessile polyp in the ascending colon 3) Sessile polyp in the sigmoid colon 4) Otherwise normal examination RECOMMENDATIONS: 1) Await pathology results 2) Repeat colonoscopy in 5 years if polyp adenomatous; otherwise 10 years 3) High fiber diet.  REPEAT EXAM:  No  ______________________________ James Rea.  Jarold Motto, MD, [facility]  CC:  Pecola Lawless, MD  n. Rosalie DoctorMarland Kitchen   James [name] at 02/23/2012 08:52 AM  James [name], [id number]

## 2012-02-23 NOTE — Patient Instructions (Signed)
DISCHARGE INSTRUCTIONS GIVEN WITH VERBAL UNDERSTANDING. HANDOUTS ON POLYPS AND DIVERTICULOSIS GIVEN. RESUME PREVIOUS MEDICATIONS.YOU HAD AN ENDOSCOPIC PROCEDURE TODAY AT THE [facility]: Refer to the procedure report that was given to you for any specific questions about what was found during the examination.  If the procedure report does not answer your questions, please call your gastroenterologist to clarify.  If you requested that your care partner not be given the details of your procedure findings, then the procedure report has been included in a sealed envelope for you to review at your convenience later.  YOU SHOULD EXPECT: Some feelings of bloating in the abdomen. Passage of more gas than usual.  Walking can help get rid of the air that was put into your GI tract during the procedure and reduce the bloating. If you had a lower endoscopy (such as a colonoscopy or flexible sigmoidoscopy) you may notice spotting of blood in your stool or on the toilet paper. If you underwent a bowel prep for your procedure, then you may not have a normal bowel movement for a few days.  DIET: Your first meal following the procedure should be a light meal and then it is ok to progress to your normal diet.  A half-sandwich or bowl of soup is an example of a good first meal.  Heavy or fried foods are harder to digest and may make you feel nauseous or bloated.  Likewise meals heavy in dairy and vegetables can cause extra gas to form and this can also increase the bloating.  Drink plenty of fluids but you should avoid alcoholic beverages for 24 hours.  ACTIVITY: Your care partner should take you home directly after the procedure.  You should plan to take it easy, moving slowly for the rest of the day.  You can resume normal activity the day after the procedure however you should NOT DRIVE or use heavy machinery for 24 hours (because of the sedation medicines used during the test).    SYMPTOMS TO REPORT  IMMEDIATELY: A gastroenterologist can be reached at any hour.  During normal business hours, 8:30 AM to 5:00 PM Monday through Friday, call [phone].  After hours and on weekends, please call the GI answering service at [phone] who will take a message and have the physician on call contact you.   Following lower endoscopy (colonoscopy or flexible sigmoidoscopy):  Excessive amounts of blood in the stool  Significant tenderness or worsening of abdominal pains  Swelling of the abdomen that is new, acute  Fever of 100F or higher  FOLLOW UP: If any biopsies were taken you will be contacted by phone or by letter within the next 1-3 weeks.  Call your gastroenterologist if you have not heard about the biopsies in 3 weeks.  Our staff will call the home number listed on your records the next business day following your procedure to check on you and address any questions or concerns that you may have at that time regarding the information given to you following your procedure. This is a courtesy call and so if there is no answer at the home number and we have not heard from you through the emergency physician on call, we will assume that you have returned to your regular daily activities without incident.  SIGNATURES/CONFIDENTIALITY: You and/or your care partner have signed paperwork which will be entered into your electronic medical record.  These signatures attest to the fact that that the information above on your After Visit Summary  has been reviewed and is understood.  Full responsibility of the confidentiality of this discharge information lies with you and/or your care-partner.  

## 2012-02-23 NOTE — Progress Notes (Signed)
Propofol per [facility]. See scanned intra procedure report. ewm 

## 2012-02-24 ENCOUNTER — Telehealth: Payer: Self-pay

## 2012-02-24 NOTE — Telephone Encounter (Signed)
  Follow up Call-  Call back number 02/23/2012  Post procedure Call Back phone  # [phone]  Permission to leave phone message Yes     Patient questions:  Do you have a fever, pain , or abdominal swelling? no Pain Score  0 *  Have you tolerated food without any problems? yes  Have you been able to return to your normal activities? yes  Do you have any questions about your discharge instructions: Diet   no Medications  no Follow up visit  no  Do you have questions or concerns about your Care? no  Actions: * If pain score is 4 or above: No action needed, pain <4.

## 2012-02-26 ENCOUNTER — Encounter: Payer: Self-pay | Admitting: Gastroenterology

## 2012-04-01 ENCOUNTER — Ambulatory Visit (INDEPENDENT_AMBULATORY_CARE_PROVIDER_SITE_OTHER): Payer: Medicare Other | Admitting: Family Medicine

## 2012-04-01 ENCOUNTER — Encounter: Payer: Self-pay | Admitting: Family Medicine

## 2012-04-01 VITALS — BP 121/74 | HR 89 | Temp 98.7°F | Ht 67.75 in | Wt 204.6 lb

## 2012-04-01 DIAGNOSIS — L259 Unspecified contact dermatitis, unspecified cause: Secondary | ICD-10-CM

## 2012-04-01 DIAGNOSIS — R21 Rash and other nonspecific skin eruption: Secondary | ICD-10-CM

## 2012-04-01 MED ORDER — TRIAMCINOLONE ACETONIDE 0.1 % EX OINT: TOPICAL_OINTMENT | Freq: Two times a day (BID) | CUTANEOUS | Status: DC

## 2012-04-01 MED ORDER — METHYLPREDNISOLONE ACETATE 80 MG/ML IJ SUSP
80.0000 mg | Freq: Once | INTRAMUSCULAR | Status: AC
  Administered 2012-04-01: 80 mg via INTRAMUSCULAR

## 2012-04-01 MED ORDER — PREDNISONE 20 MG PO TABS: ORAL_TABLET | ORAL | Status: DC

## 2012-04-01 NOTE — Patient Instructions (Addendum)
Start the Prednisone tomorrow- 2 tabs at the same time w/ food Use the Triamcinolone twice daily as needed for itching Call with any questions or concerns- particularly if not improving or worsening Hang in there!

## 2012-04-01 NOTE — Progress Notes (Signed)
  Subjective:    Patient ID: James [name], male    DOB: 1946-04-24, 66 y.o.   MRN: [id number]  HPI Rash- pt was playing tennis 1 week ago and had a stinging sensation along L flank.  Area started small and then enlarged rapidly.  Now itchy and swollen.  Has similar area on inner L upper arm   Review of Systems For ROS see HPI     Objective:   Physical Exam  Vitals reviewed. Constitutional: He appears well-developed and well-nourished. No distress.  Skin: Skin is warm and dry. Rash (pt w/ vesicular rash on L flank w/ kissing lesion on medial L upper arm) noted.          Assessment & Plan:

## 2012-04-04 DIAGNOSIS — L259 Unspecified contact dermatitis, unspecified cause: Secondary | ICD-10-CM | POA: Insufficient documentation

## 2012-04-04 NOTE — Assessment & Plan Note (Signed)
New.  Appears consistent w/ poison ivy but unknown exposure.  Due to widespread area will give depomedrol shot in office and then pt to start Prednisone.  Triamcinolone given for topical use to control itching.  Reviewed supportive care and red flags that should prompt return.  Pt expressed understanding and is in agreement w/ plan.

## 2012-08-04 ENCOUNTER — Other Ambulatory Visit: Payer: Self-pay | Admitting: Internal Medicine

## 2012-08-11 ENCOUNTER — Other Ambulatory Visit: Payer: Self-pay | Admitting: Internal Medicine

## 2012-08-11 MED ORDER — PRAVASTATIN SODIUM 40 MG PO TABS: 40.0000 mg | ORAL_TABLET | Freq: Every day | ORAL | Status: DC

## 2012-09-14 ENCOUNTER — Other Ambulatory Visit: Payer: Self-pay

## 2012-09-17 ENCOUNTER — Encounter: Payer: Self-pay | Admitting: Internal Medicine

## 2012-09-17 DIAGNOSIS — C4491 Basal cell carcinoma of skin, unspecified: Secondary | ICD-10-CM | POA: Insufficient documentation

## 2012-09-17 DIAGNOSIS — Z85828 Personal history of other malignant neoplasm of skin: Secondary | ICD-10-CM | POA: Insufficient documentation

## 2012-11-20 ENCOUNTER — Other Ambulatory Visit: Payer: Self-pay

## 2013-01-06 ENCOUNTER — Encounter: Payer: Medicare Other | Admitting: Internal Medicine

## 2013-01-14 ENCOUNTER — Ambulatory Visit (INDEPENDENT_AMBULATORY_CARE_PROVIDER_SITE_OTHER): Payer: Medicare Other | Admitting: Internal Medicine

## 2013-01-14 ENCOUNTER — Encounter: Payer: Self-pay | Admitting: Internal Medicine

## 2013-01-14 VITALS — BP 124/78 | HR 63 | Temp 98.3°F | Resp 12 | Wt 204.0 lb

## 2013-01-14 DIAGNOSIS — Z860100 Personal history of colon polyps, unspecified: Secondary | ICD-10-CM

## 2013-01-14 DIAGNOSIS — R9431 Abnormal electrocardiogram [ECG] [EKG]: Secondary | ICD-10-CM

## 2013-01-14 DIAGNOSIS — Z0000 Encounter for general adult medical examination without abnormal findings: Secondary | ICD-10-CM

## 2013-01-14 DIAGNOSIS — E785 Hyperlipidemia, unspecified: Secondary | ICD-10-CM

## 2013-01-14 LAB — CBC WITH DIFFERENTIAL/PLATELET
Eosinophils Relative: 2.7 % (ref 0.0–5.0)
HCT: 43.9 % (ref 39.0–52.0)
Hemoglobin: 15.3 g/dL (ref 13.0–17.0)
Lymphs Abs: 2.1 10*3/uL (ref 0.7–4.0)
Monocytes Relative: 8.6 % (ref 3.0–12.0)
Neutro Abs: 2.6 10*3/uL (ref 1.4–7.7)
RDW: 14 % (ref 11.5–14.6)
WBC: 5.3 10*3/uL (ref 4.5–10.5)

## 2013-01-14 LAB — HEPATIC FUNCTION PANEL
ALT: 13 U/L (ref 0–53)
AST: 25 U/L (ref 0–37)
Albumin: 4.4 g/dL (ref 3.5–5.2)
Total Protein: 7.3 g/dL (ref 6.0–8.3)

## 2013-01-14 LAB — BASIC METABOLIC PANEL
GFR: 85.85 mL/min (ref >60.00)
Glucose, Bld: 98 mg/dL (ref 70–99)
Potassium: 4 mEq/L (ref 3.5–5.1)
Sodium: 135 mEq/L (ref 135–145)

## 2013-01-14 LAB — LIPID PANEL: Cholesterol: 167 mg/dL (ref 0–200)

## 2013-01-14 MED ORDER — FLUTICASONE PROPIONATE 50 MCG/ACT NA SUSP: 1.0000 | Freq: Two times a day (BID) | NASAL | Status: DC | PRN

## 2013-01-14 MED ORDER — PRAVASTATIN SODIUM 40 MG PO TABS: 40.0000 mg | ORAL_TABLET | Freq: Every day | ORAL | Status: DC

## 2013-01-14 NOTE — Patient Instructions (Addendum)
Plain Mucinex (NOT D) for thick secretions ;force NON dairy fluids .   Nasal cleansing in the shower as discussed with lather of mild shampoo.After 10 seconds wash off lather while  exhaling through nostrils. Make sure that all residual soap is removed to prevent irritation.  Fluticasone 1 spray in each nostril twice a day as needed. Use the "crossover" technique into opposite nostril spraying toward opposite ear @ 45 degree angle, not straight up into nostril.  Use a Neti pot daily only  as needed for significant sinus congestion; going from open side to congested side . Plain Allegra (NOT D )  160 daily , Loratidine 10 mg , OR Zyrtec 10 mg @ bedtime  as needed for itchy eyes & sneezing. To prevent palpitations or premature beats, avoid stimulants such as decongestants, diet pills, nicotine, or caffeine (coffee, tea, cola, or chocolate) to excess. Review and correct the record as indicated. Please share record with all medical staff seen.

## 2013-01-14 NOTE — Progress Notes (Signed)
Subjective:    Patient ID: James [name], male    DOB: 1945-11-24, 67 y.o.   MRN: [id number]  HPI  He is here for a  Medicare physical;acute issues include perennial rhinitis. Medicare Wellness Visit:  Psychosocial & medical history were reviewed as required by Medicare (abuse,antisocial behavioral risks,firearm risk).  Social history: caffeine: 2 cups coffee/ day , alcohol: 2-3 drinks / week  ,  tobacco use: quit 1969 Exercise :  See below No home & personal  safety / fall risk Activities of daily living: no limitations  Seatbelt  and smoke alarm employed. Power of Attorney/Living Will status : in place Ophthalmology exam current Hearing evaluation not current Orientation :oriented X 3  Memory & recall :good Spelling  testing:good Mood & affect : normal . Depression / anxiety: denied Travel history : last [place] 3/13  Immunization status : Shingles /Flu/ PNA/ tetanus current Transfusion history:  With ulcer 1969  Preventive health surveillance ( colonoscopy as per protocol/ [facility]): current  Dental care:  Every 6 mos. Chart reviewed &  Updated. Active issues reviewed & addressed.       Review of Systems  He describes chronic rhinitis without associated itchy, watery eyes. Over-the-counter allergy medications used with some benefit. He has no history of asthma. He is on a modified heart healthy diet; he exercises as golf & tennis 5 times per week without symptoms. Specifically he denies chest pain, palpitations, dyspnea, or claudication. Family history is negative for premature coronary disease. Advanced cholesterol testing reveals his LDL goal was less than 110.     Objective:   Physical Exam Gen.: Healthy and well-nourished in appearance. Alert, appropriate and cooperative throughout exam. Appears younger than stated age  Head: Normocephalic without obvious abnormalities Eyes: No corneal or conjunctival inflammation noted.  Extraocular motion intact. Vision grossly normal  without lenses Ears: External  ear exam reveals no significant lesions or deformities. Canals clear .TMs normal. Hearing is grossly decreased bilaterally. Nose: External nasal exam reveals no deformity or inflammation. R septal mucosa dry & slightly erythematous. No lesions or exudates noted. Mouth: Oral mucosa and oropharynx reveal no lesions or exudates. Teeth in good repair. Neck: No deformities, masses, or tenderness noted. Range of motion & Thyroid normal. Lungs: Normal respiratory effort; chest expands symmetrically. Lungs are clear to auscultation without rales, wheezes, or increased work of breathing. Heart: Normal rate and rhythm. Normal S1 and S2. No gallop, click, or rub. S4 w/o murmur. Abdomen: Bowel sounds normal; abdomen soft and nontender. No masses, organomegaly or hernias noted. Genitalia: Genitalia normal except for left varices. Prostate is normal without enlargement, asymmetry, nodularity, or induration.                              Musculoskeletal/extremities: There is some asymmetry of the posterior thoracic musculature suggesting occult scoliosis. No clubbing, cyanosis, edema, or significant extremity  deformity noted. Range of motion normal .Tone & strength  Normal. Joints normal . Nail health good. Able to lie down & sit up w/o help. Negative SLR bilaterally Vascular: Carotid, radial artery, dorsalis pedis and  posterior tibial pulses are full and equal. No bruits present. Neurologic: Alert and oriented x3. Deep tendon reflexes symmetrical and normal.       Skin: Intact without suspicious lesions or rashes. "Myriad" keratoses , especially posterior thorax Lymph: No cervical, axillary, or inguinal lymphadenopathy present. Psych: Mood and affect are normal. Normally interactive  Assessment & Plan:  #1 Medicare Wellness Exam; criteria met ; data entered #2 Problem List reviewed ;  Assessment/ Recommendations made Plan: see Orders

## 2013-04-12 ENCOUNTER — Encounter: Payer: Self-pay | Admitting: Internal Medicine

## 2013-04-12 ENCOUNTER — Other Ambulatory Visit: Payer: Self-pay

## 2013-04-12 DIAGNOSIS — E785 Hyperlipidemia, unspecified: Secondary | ICD-10-CM

## 2013-04-12 MED ORDER — PRAVASTATIN SODIUM 40 MG PO TABS: 40.0000 mg | ORAL_TABLET | Freq: Every day | ORAL | Status: DC

## 2013-04-13 ENCOUNTER — Other Ambulatory Visit: Payer: Self-pay | Admitting: *Deleted

## 2013-04-13 DIAGNOSIS — E785 Hyperlipidemia, unspecified: Secondary | ICD-10-CM

## 2013-04-13 MED ORDER — PRAVASTATIN SODIUM 40 MG PO TABS: 40.0000 mg | ORAL_TABLET | Freq: Every day | ORAL | Status: DC

## 2013-08-11 ENCOUNTER — Other Ambulatory Visit: Payer: Self-pay

## 2013-08-16 ENCOUNTER — Encounter: Payer: Self-pay | Admitting: Cardiology

## 2013-09-20 ENCOUNTER — Ambulatory Visit (INDEPENDENT_AMBULATORY_CARE_PROVIDER_SITE_OTHER): Payer: Medicare Other

## 2013-09-20 DIAGNOSIS — Z23 Encounter for immunization: Secondary | ICD-10-CM

## 2013-11-15 ENCOUNTER — Other Ambulatory Visit: Payer: Self-pay | Admitting: *Deleted

## 2013-11-15 DIAGNOSIS — E785 Hyperlipidemia, unspecified: Secondary | ICD-10-CM

## 2013-11-15 MED ORDER — PRAVASTATIN SODIUM 40 MG PO TABS: 40.0000 mg | ORAL_TABLET | Freq: Every day | ORAL | Status: DC

## 2013-11-15 NOTE — Telephone Encounter (Signed)
Rx sent to the pharmacy by e-script.//AB/CMA 

## 2013-12-16 ENCOUNTER — Encounter: Payer: Self-pay | Admitting: Internal Medicine

## 2013-12-16 ENCOUNTER — Other Ambulatory Visit (INDEPENDENT_AMBULATORY_CARE_PROVIDER_SITE_OTHER): Payer: Medicare Other

## 2013-12-16 ENCOUNTER — Encounter: Payer: Self-pay | Admitting: Gastroenterology

## 2013-12-16 ENCOUNTER — Ambulatory Visit (INDEPENDENT_AMBULATORY_CARE_PROVIDER_SITE_OTHER): Payer: Medicare Other | Admitting: Internal Medicine

## 2013-12-16 VITALS — BP 110/80 | HR 79 | Temp 96.2°F | Resp 13 | Wt 196.2 lb

## 2013-12-16 DIAGNOSIS — R109 Unspecified abdominal pain: Secondary | ICD-10-CM

## 2013-12-16 DIAGNOSIS — Z9289 Personal history of other medical treatment: Secondary | ICD-10-CM

## 2013-12-16 DIAGNOSIS — R195 Other fecal abnormalities: Secondary | ICD-10-CM

## 2013-12-16 DIAGNOSIS — Z8719 Personal history of other diseases of the digestive system: Secondary | ICD-10-CM | POA: Insufficient documentation

## 2013-12-16 DIAGNOSIS — R112 Nausea with vomiting, unspecified: Secondary | ICD-10-CM

## 2013-12-16 DIAGNOSIS — Z9189 Other specified personal risk factors, not elsewhere classified: Secondary | ICD-10-CM

## 2013-12-16 LAB — CBC
HCT: 50.9 % (ref 39.0–52.0)
Hemoglobin: 17.5 g/dL — ABNORMAL HIGH (ref 13.0–17.0)
MCHC: 34.4 g/dL (ref 30.0–36.0)
MCV: 89.5 fl (ref 78.0–100.0)
PLATELETS: 170 10*3/uL (ref 150.0–400.0)
RBC: 5.69 Mil/uL (ref 4.22–5.81)
RDW: 13.6 % (ref 11.5–14.6)
WBC: 6.3 10*3/uL (ref 4.5–10.5)

## 2013-12-16 LAB — BASIC METABOLIC PANEL
BUN: 20 mg/dL (ref 6–23)
CALCIUM: 9 mg/dL (ref 8.4–10.5)
CO2: 27 meq/L (ref 19–32)
Chloride: 99 mEq/L (ref 96–112)
Creatinine, Ser: 1.1 mg/dL (ref 0.4–1.5)
GFR: 74.43 mL/min (ref >60.00)
Glucose, Bld: 93 mg/dL (ref 70–99)
Potassium: 3.5 mEq/L (ref 3.5–5.1)
SODIUM: 136 meq/L (ref 135–145)

## 2013-12-16 LAB — LIPASE: Lipase: 21 U/L (ref 11.0–59.0)

## 2013-12-16 LAB — HEPATIC FUNCTION PANEL
ALK PHOS: 56 U/L (ref 39–117)
ALT: 14 U/L (ref 0–53)
AST: 32 U/L (ref 0–37)
Albumin: 4.6 g/dL (ref 3.5–5.2)
BILIRUBIN DIRECT: 0.1 mg/dL (ref 0.0–0.3)
TOTAL PROTEIN: 7.6 g/dL (ref 6.0–8.3)
Total Bilirubin: 0.9 mg/dL (ref 0.3–1.2)

## 2013-12-16 LAB — AMYLASE: Amylase: 49 U/L (ref 27–131)

## 2013-12-16 MED ORDER — OMEPRAZOLE 20 MG PO CPDR: DELAYED_RELEASE_CAPSULE | ORAL | Status: DC

## 2013-12-16 NOTE — Progress Notes (Signed)
   Subjective:    Patient ID: James [name], male    DOB: 1945/11/23, 68 y.o.   MRN: [id number]  HPI  Symptoms began 3/9 as nausea and vomiting. This was intractable through the day and night. It has persisted during the period 3/9-3/11. He now describes dry heaves.  This is associated with diffuse mid to lower abdominal pain. Described as dull and worse after he eats or drinks.  He took [employer], but could not keep it down. He also tried Maalox and it provided some relief.   He describes frank melena. He also has decreased urination. He also has anorexia. He has had a 10 pound weight loss in the last week.  Significant history includes duodenal ulcer disease in 1969. He said mild intermittent flares of symptoms since. PMH colon polyps; repeat due 2015.        Review of Systems  He denies dysphagia, significant dyspepsia, hematemesis, fever, chills, sweats, dysuria, pyuria, or hematuria. He has no pain in the spinal area which radiates anteriorly.     Objective:   Physical Exam General appearance is one of good health and nourishment w/o distress.  Eyes: No conjunctival inflammation or scleral icterus is present.  Oral exam: Dental hygiene is good; lips and gums are healthy appearing.There is no oropharyngeal erythema or exudate noted.   Heart:  Normal rate and regular rhythm. S1 and S2 normal without gallop, murmur, click, rub or other extra sounds     Lungs:Chest clear to auscultation; no wheezes, rhonchi,rales ,or rubs present.No increased work of breathing.   Abdomen: bowel sounds normal, soft but diffusely tender without masses, organomegaly or hernias noted.  No guarding or rebound . No tenderness over the flanks to percussion  Musculoskeletal: Able to lie flat and sit up without help. Negative straight leg raising bilaterally.   Skin:Warm & dry.  Intact without suspicious lesions or rashes ; no jaundice ; minimal tenting  Lymphatic: No lymphadenopathy is noted  about the head, neck, axilla, or inguinal areas.   Genitourinary exam reveals small varices in the left scrotum. Prostate is upper limits of normal without nodularity. Stool is liquid and gray-black. Hemoccult testing is negative.               Assessment & Plan:  #1 nausea vomiting  #2 dark stool  #3 history of peptic ulcer disease  Plan: See orders

## 2013-12-16 NOTE — Addendum Note (Signed)
Addended by: Harl Bowie on: 12/16/2013 11:58 AM   Modules accepted: Orders

## 2013-12-16 NOTE — Progress Notes (Signed)
Pre visit review using our clinic review tool, if applicable. No additional management support is needed unless otherwise documented below in the visit note. 

## 2013-12-16 NOTE — Patient Instructions (Signed)
Your next office appointment will be determined based upon review of your pending labs & response to therapy. Those instructions will be transmitted to you through My Chart   Followup as needed for your acute issue. Please report any significant change in your symptoms.To ER if pain persists or is associaled with Warning Signs as discussed. Reflux of gastric acid may be asymptomatic as this may occur mainly during sleep.The triggers for reflux  include stress; the "aspirin family" ; alcohol; peppermint; and caffeine (coffee, tea, cola, and chocolate). The aspirin family would include aspirin and the nonsteroidal agents such as ibuprofen &  Naproxen. Tylenol would not cause reflux. If having symptoms ; food & drink should be avoided for @ least 2 hours before going to bed.

## 2013-12-17 LAB — HEPATITIS C ANTIBODY: HCV Ab: NEGATIVE

## 2014-01-11 ENCOUNTER — Other Ambulatory Visit: Payer: Self-pay

## 2014-01-13 ENCOUNTER — Encounter: Payer: Self-pay | Admitting: Internal Medicine

## 2014-01-19 ENCOUNTER — Encounter: Payer: Medicare Other | Admitting: Internal Medicine

## 2014-01-27 ENCOUNTER — Ambulatory Visit: Payer: Medicare Other | Admitting: Gastroenterology

## 2014-01-31 ENCOUNTER — Encounter: Payer: Self-pay | Admitting: Internal Medicine

## 2014-01-31 ENCOUNTER — Other Ambulatory Visit (INDEPENDENT_AMBULATORY_CARE_PROVIDER_SITE_OTHER): Payer: Medicare Other

## 2014-01-31 ENCOUNTER — Ambulatory Visit: Payer: Medicare Other | Admitting: Gastroenterology

## 2014-01-31 ENCOUNTER — Ambulatory Visit (INDEPENDENT_AMBULATORY_CARE_PROVIDER_SITE_OTHER): Payer: Medicare Other | Admitting: Internal Medicine

## 2014-01-31 VITALS — BP 144/98 | HR 79 | Temp 97.8°F | Resp 14 | Ht 67.75 in | Wt 200.4 lb

## 2014-01-31 DIAGNOSIS — E785 Hyperlipidemia, unspecified: Secondary | ICD-10-CM

## 2014-01-31 DIAGNOSIS — E782 Mixed hyperlipidemia: Secondary | ICD-10-CM

## 2014-01-31 DIAGNOSIS — Z86010 Personal history of colonic polyps: Secondary | ICD-10-CM

## 2014-01-31 DIAGNOSIS — Z833 Family history of diabetes mellitus: Secondary | ICD-10-CM

## 2014-01-31 DIAGNOSIS — R030 Elevated blood-pressure reading, without diagnosis of hypertension: Secondary | ICD-10-CM

## 2014-01-31 DIAGNOSIS — R7309 Other abnormal glucose: Secondary | ICD-10-CM

## 2014-01-31 LAB — HEMOGLOBIN A1C: HEMOGLOBIN A1C: 5.3 % (ref 4.6–6.5)

## 2014-01-31 LAB — TSH: TSH: 2.24 u[IU]/mL (ref 0.35–5.50)

## 2014-01-31 LAB — LIPID PANEL
CHOL/HDL RATIO: 3
Cholesterol: 195 mg/dL (ref 0–200)
HDL: 60.6 mg/dL (ref >39.00)
LDL CALC: 122 mg/dL — AB (ref 0–99)
Triglycerides: 62 mg/dL (ref 0.0–149.0)
VLDL: 12.4 mg/dL (ref 0.0–40.0)

## 2014-01-31 MED ORDER — PRAVASTATIN SODIUM 40 MG PO TABS: 40.0000 mg | ORAL_TABLET | Freq: Every day | ORAL | Status: DC

## 2014-01-31 NOTE — Assessment & Plan Note (Addendum)
Lipids, TSH  LFT current

## 2014-01-31 NOTE — Progress Notes (Signed)
Subjective:    Patient ID: James [name], male    DOB: 03-Jun-1946, 68 y.o.   MRN: [id number]  HPI He is here for a physical;acute issues denied.   Medicare Wellness Visit: Psychosocial and medical history were reviewed as required by Medicare (history related to abuse, antisocial behavior , firearm risk). Social history: 5 cups coffee/soda per day- caffeine; glass of wine in evenings - alcohol; no tobacco use: Exercise: tennis and golf; yardwork Personal safety/fall risk: Y Limitations of activities of daily living: N Seatbelt/ smoke alarm use: Y Special educational needs teacher of Attorney/Living Will status: Y Ophthalmologic exam status: 9 months ago; [facility] Hearing evaluation status: 2 years ago Orientation: Oriented X 4 Memory and recall: 3-word recall Spelling or math testing: math completed Depression/anxiety assessment: none Foreign travel history: none Immunization status for influenza/pneumonia/ shingles /tetanus: No pneumonia Transfusion history: 1972 blood transfusion d/t DUD Preventive health care maintenance status: Colonoscopy 2012, [facility]. Due 2105.  Dental care: 3 months ago; Dr. Marcello Moores Chart reviewed and updated. Active issues reviewed and addressed as documented below.  Review of Systems A heart healthy diet is followed; exercise encompasses 1 round of golf/week and 6 hrs of tennis/week minutes without symptoms.  Family history is negative for premature coronary disease. Advanced cholesterol testing reveals  LDL goal is less than 110 ; ideally < 80. There is medication compliance with the statin.  Low dose ASA not taken. Specifically denied are  chest pain, palpitations, dyspnea, or claudication.  Significant abdominal symptoms, memory deficit, or myalgias not present.    Objective:   Physical Exam  Gen.: Healthy and well-nourished in appearance. Alert, appropriate and cooperative throughout exam. Appears younger than stated age.  Head: Normocephalic without  obvious abnormalities; no alopecia  Eyes: No corneal or conjunctival inflammation noted. Pupils equal round reactive to light and accommodation. Extraocular motion intact. Fundal exam is benign without hemorrhages, exudate, papilledema.  Vision grossly normal with /w/o lenses Ears: External  ear exam reveals no significant lesions or deformities. Canals clear .TMs normal. Hearing is grossly normal bilaterally. Nose: External nasal exam reveals no deformity or inflammation. Nasal mucosa are pink and moist. No lesions or exudates noted.   Mouth: Oral mucosa and oropharynx reveal no lesions or exudates. Teeth in good repair. Neck: No deformities, masses, or tenderness noted. Range of motion WNL. Thyroid palpable. Lungs: Normal respiratory effort; chest expands symmetrically. Lungs are clear to auscultation without rales, wheezes, or increased work of breathing. Heart: Normal rate and rhythm. Normal S1 and S2. No gallop, click, or rub. No murmur. Abdomen: Bowel sounds normal; abdomen soft and nontender. No masses, organomegaly or hernias noted. MALE GU:                                Musculoskeletal/extremities: No deformity or scoliosis noted of  the thoracic or lumbar spine.   OR Accentuated curvature of upper thoracic spine. OR There is some asymmetry of the posterior thoracic musculature suggesting occult scoliosis. No clubbing, cyanosis, edema, or significant extremity  deformity noted.  Range of motion normal .Tone & strength normal. Hand joints normal. Fingernail / toenail health good. Able to lie down & sit up w/o help. Negative SLR bilaterally Vascular: Carotid, radial artery, dorsalis pedis and  posterior tibial pulses are full and equal. No bruits present. Neurologic: Alert and oriented x3. Deep tendon reflexes symmetrical and normal.  Gait normal.     Skin: Intact without suspicious  lesions or rashes. Lymph: No cervical, axillary lymphadenopathy present. Psych: Mood and affect are  normal. Normally interactive                                                                                   Assessment & Plan:  #1 Medicare Wellness Exam; criteria met; data entered #2 Problem List/Diagnoses reviewed Plan:  Assessments made/ Orders entered

## 2014-01-31 NOTE — Patient Instructions (Signed)

## 2014-01-31 NOTE — Progress Notes (Signed)
Pre visit review using our clinic review tool, if applicable. No additional management support is needed unless otherwise documented below in the visit note. 

## 2014-01-31 NOTE — Assessment & Plan Note (Signed)
BMET normal last month BP goals discussed

## 2014-01-31 NOTE — Assessment & Plan Note (Signed)
A1c

## 2014-01-31 NOTE — Assessment & Plan Note (Addendum)
Schedule colonoscopy  CBC current

## 2014-01-31 NOTE — Progress Notes (Signed)
Subjective:    Patient ID: James [name], male    DOB: March 25, 1946, 68 y.o.   MRN: [id number]  HPI He is here to assess active health issues & conditions. PMH, FH, & Social history verified & updated   A heart healthy diet is followed; exercise encompasses 1 round of golf per week & 6 hrs tennis without symptoms.  Family history is negative for premature coronary disease. Advanced cholesterol testing reveals  LDL goal is less than 110 ; ideally <80 . There is medication compliance with the statin.  Low dose ASA taken. Ophthalmologic exam is current ;no retinopathy present. Foot care not current   Review of Systems Specifically denied are  chest pain, palpitations, dyspnea, or claudication.  Significant abdominal symptoms, memory deficit, or myalgias not present.  Polyuria, polyphagia, polydipsia absent. There is no blurred vision, double vision, or loss of vision.  Also denied are numbness, tingling, or burning of the extremities. No nonhealing skin lesions present. Weight is down 5 # on purpose..      Objective:   Physical Exam Gen.: Healthy and well-nourished in appearance. Alert, appropriate and cooperative throughout exam. Appears younger than stated age  Head: Normocephalic without obvious abnormalities;no alopecia  Eyes: No corneal or conjunctival inflammation noted. Pupils equal round reactive to light and accommodation. Extraocular motion intact.  Ears: External  ear exam reveals no significant lesions or deformities. Canals clear .TMs normal. Hearing is grossly decreased bilaterally. Nose: External nasal exam reveals no deformity or inflammation. Nasal mucosa are pink and moist. No lesions or exudates noted.   Mouth: Oral mucosa and oropharynx reveal no lesions or exudates. Teeth in good repair. Neck: No deformities, masses, or tenderness noted. Range of motion &. Thyroid normal. Lungs: Normal respiratory effort; chest expands symmetrically. Lungs are clear to auscultation  without rales, wheezes, or increased work of breathing. Heart: Normal rate and rhythm. Normal S1 and S2. No gallop, click, or rub. No murmur. Abdomen: Bowel sounds normal; abdomen soft and nontender. No masses, organomegaly or hernias noted. Genitalia: Genitalia normal except for left varices. Prostate is normal without enlargement, asymmetry, nodularity, or induration                                    Musculoskeletal/extremities: No deformity or scoliosis noted of  the thoracic or lumbar spine.  No clubbing, cyanosis, edema, or significant extremity  deformity noted. Range of motion normal .Tone & strength normal. Hand joints normal.  Fingernail  health good. Able to lie down & sit up w/o help. Negative SLR bilaterally Vascular: Carotid, radial artery, dorsalis pedis and  posterior tibial pulses are full and equal. No bruits present. Neurologic: Alert and oriented x3. Deep tendon reflexes symmetrical and normal.  Gait normal . Skin: Intact without suspicious lesions or rashes. Keratoses Lymph: No cervical, axillary, or inguinal lymphadenopathy present. Psych: Mood and affect are normal. Normally interactive                                                                                        Assessment & Plan:  See Current Assessment & Plan in Problem List under specific DiagnosisThe labs will be reviewed and risks and options assessed. Written recommendations will be provided by mail or directly through My Chart.Further evaluation or change in medical therapy will be directed by those results.

## 2014-05-22 ENCOUNTER — Encounter: Payer: Self-pay | Admitting: Internal Medicine

## 2014-06-01 ENCOUNTER — Encounter: Payer: Self-pay | Admitting: Internal Medicine

## 2014-06-01 ENCOUNTER — Ambulatory Visit (INDEPENDENT_AMBULATORY_CARE_PROVIDER_SITE_OTHER): Payer: Medicare Other | Admitting: Internal Medicine

## 2014-06-01 VITALS — BP 120/90 | HR 64 | Temp 98.6°F | Wt 204.2 lb

## 2014-06-01 DIAGNOSIS — N529 Male erectile dysfunction, unspecified: Secondary | ICD-10-CM

## 2014-06-01 DIAGNOSIS — N400 Enlarged prostate without lower urinary tract symptoms: Secondary | ICD-10-CM

## 2014-06-01 DIAGNOSIS — R972 Elevated prostate specific antigen [PSA]: Secondary | ICD-10-CM

## 2014-06-01 DIAGNOSIS — E559 Vitamin D deficiency, unspecified: Secondary | ICD-10-CM

## 2014-06-01 NOTE — Patient Instructions (Signed)
The Urology  referral will be scheduled and you'll be notified of the time.

## 2014-06-01 NOTE — Progress Notes (Signed)
Pre visit review using our clinic review tool, if applicable. No additional management support is needed unless otherwise documented below in the visit note. 

## 2014-06-01 NOTE — Progress Notes (Signed)
   Subjective:    Patient ID: James [name], male    DOB: Feb 09, 1946, 68 y.o.   MRN: [id number]  HPI   He had a "physical" at the [place] which consisted only of lab work 05/04/14.  His PSA was found to be 5.36.  Additionally vitamin D level was 22.28.  He has no urinary tract or genitourinary symptoms except for some erectile dysfunction  His last PSA on record was 12/21/2009 with a value of 1.85.  He had digital rectal exam/11/14; prostate was normal at that time  He was seen acutely for abdominal pain 12/17/11 of this year; prostate was @ upper limits of normal without nodularity or induration.  There is no family history of prostate cancer.    Review of Systems Dysuria, pyuria, hematuria, frequency, nocturia or polyuria are denied.     Objective:   Physical Exam  Significant or distinguishing  findings on physical exam include: Prostate is mildly enlarged symmetrically. I do not appreciate nodularity or induration. He also has a grade 1 systolic murmur. He has an acute abrasion of  left shin related to tennis injury.  General appearance :adequately nourished; in no distress. Eyes: No conjunctival inflammation or scleral icterus is present. Heart:  Normal rate and regular rhythm. S1 and S2 normal without gallop, click, rub or other extra sounds   Lungs:Chest clear to auscultation; no wheezes, rhonchi,rales ,or rubs present.No increased work of breathing.  Abdomen: bowel sounds normal, soft and non-tender without masses, organomegaly or hernias noted.  No guarding or rebound. No flank tenderness to percussion. Skin:Warm & dry.  Intact without suspicious lesions or rashes ; no jaundice or tenting Lymphatic: No lymphadenopathy is noted about the head, neck, axilla, or inguinal areas.             Assessment & Plan:  #1 mild prostatic hypertrophy without lower urinary tract symptoms  #2 elevated PSA  #3 vitamin D deficiency  Plan: Urology consultation.

## 2014-06-03 DIAGNOSIS — E559 Vitamin D deficiency, unspecified: Secondary | ICD-10-CM | POA: Insufficient documentation

## 2014-06-03 DIAGNOSIS — R972 Elevated prostate specific antigen [PSA]: Secondary | ICD-10-CM | POA: Insufficient documentation

## 2014-06-03 NOTE — Assessment & Plan Note (Signed)
Vitamin D3 1000 IU qd

## 2014-06-07 ENCOUNTER — Encounter: Payer: Self-pay | Admitting: Internal Medicine

## 2014-09-06 DIAGNOSIS — C61 Malignant neoplasm of prostate: Secondary | ICD-10-CM

## 2014-09-06 HISTORY — DX: Malignant neoplasm of prostate: C61

## 2014-09-06 HISTORY — PX: PROSTATE BIOPSY: SHX241

## 2014-09-21 ENCOUNTER — Encounter: Payer: Self-pay | Admitting: Internal Medicine

## 2014-10-06 DIAGNOSIS — Z931 Gastrostomy status: Secondary | ICD-10-CM

## 2014-10-06 HISTORY — DX: Gastrostomy status: Z93.1

## 2014-10-18 ENCOUNTER — Ambulatory Visit: Payer: Medicare Other | Admitting: Radiation Oncology

## 2014-10-23 ENCOUNTER — Encounter: Payer: Self-pay | Admitting: Radiation Oncology

## 2014-10-23 NOTE — Progress Notes (Signed)
GU Location of Tumor / Histology: Cancer of the Prostate  If Prostate Cancer, Gleason Score is: Right Base lateral  (3 + 3), Right Base Medial (3+4), right Mid lateral and Right mid Medial (4+3) and PSA is (5.7),  PSA Density 0.17  Volume 32.7 mL  Biopsies of Prostate revealed:09/06/14       [name] presented  With an elevated PSA of 5.36 after having a physical at the [place] on 05/04/14.  At this time he had no urinary tract or genitourinary symptoms, but had erectile dysfunction   Past/Anticipated interventions by urology, if any: Dr. Franchot Gallo- Biopsy of the Prostate  Past/Anticipated interventions by medical oncology, if any: N/A  Weight changes, if any: no  Bowel/Bladder complaints, if any: IPSS 4, nocturia x 1  Nausea/Vomiting, if any: None  Pain issues, if any:  no  SAFETY ISSUES:  Prior radiation? No  Pacemaker/ICD? N0  Possible current pregnancy? N/A  Is the patient on methotrexate? No  Current Complaints / other details:  Married No family history of prostate cancer   Former smoker- Quit 1969, Alcohol < 3/wk, No Drug Use

## 2014-10-24 ENCOUNTER — Ambulatory Visit
Admission: RE | Admit: 2014-10-24 | Discharge: 2014-10-24 | Disposition: A | Discharge status: Home / Self Care | Payer: Medicare Other | Source: Ambulatory Visit | Attending: Radiation Oncology | Admitting: Radiation Oncology

## 2014-10-24 ENCOUNTER — Encounter: Payer: Self-pay | Admitting: Radiation Oncology

## 2014-10-24 VITALS — BP 129/76 | HR 82 | Temp 98.3°F | Resp 18 | Ht 67.75 in | Wt 206.7 lb

## 2014-10-24 DIAGNOSIS — Z87891 Personal history of nicotine dependence: Secondary | ICD-10-CM | POA: Diagnosis not present

## 2014-10-24 DIAGNOSIS — E785 Hyperlipidemia, unspecified: Secondary | ICD-10-CM | POA: Insufficient documentation

## 2014-10-24 DIAGNOSIS — Z510 Encounter for antineoplastic radiation therapy: Secondary | ICD-10-CM | POA: Insufficient documentation

## 2014-10-24 DIAGNOSIS — C61 Malignant neoplasm of prostate: Secondary | ICD-10-CM | POA: Diagnosis not present

## 2014-10-24 DIAGNOSIS — Z8546 Personal history of malignant neoplasm of prostate: Secondary | ICD-10-CM | POA: Insufficient documentation

## 2014-10-24 HISTORY — DX: Cardiac murmur, unspecified: R01.1

## 2014-10-24 HISTORY — DX: Personal history of other malignant neoplasm of skin: Z85.828

## 2014-10-24 HISTORY — DX: Malignant neoplasm of prostate: C61

## 2014-10-24 HISTORY — DX: Gastro-esophageal reflux disease without esophagitis: K21.9

## 2014-10-24 NOTE — Progress Notes (Signed)
CC: Dr. Franchot Gallo  James [name] called me today, and he wants to proceed with external beam/IMRT.  He tells me that he is not interested in meeting with a Agricultural consultant.  I will need to have Dr. Diona Fanti placed 3 gold seed markers for image guidance, and then we will get him scheduled for CT simulation.

## 2014-10-24 NOTE — Progress Notes (Signed)
Please see the Nurse Progress Note in the MD Initial Consult Encounter for this patient. 

## 2014-10-24 NOTE — Progress Notes (Signed)
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 male who is seen today through the courtesy Dr. Diona Fanti for discussion of possible radiation therapy in the management of his stage TIc intermediate risk adenocarcinoma prostate.  His PSA at a [facility] his past July was 5.36.  This was repeated by Dr. Diona Fanti on 07/17/2014 and was elevated at 5.71.  He underwent ultrasound-guided biopsies on 09/06/2014 finding Gleason 7 (4+3) involving 70% of one core from the right lateral mid gland and 70% of one core from the right mid gland.  He also had Gleason 7 (3+4) involving 80% of one core from the right base and Gleason 6 (3+3) involving 30% of one core from the right lateral base.  His gland volume was approximately 33 mL.  He is doing well from a GU and GI standpoint.  His I PSS score is 4.  He is potent and sexually active.  PREVIOUS RADIATION THERAPY: No   PAST MEDICAL HISTORY:  has a past medical history of Abnormal EKG; Hyperlipemia; Ulcer (1969); Prostate cancer (09/06/14); Cardiac murmur; Asthma; basal cell carcinoma; and Esophageal reflux.     PAST SURGICAL HISTORY:  Past Surgical History  Procedure Laterality Date  . Trigger finger release  1998  . Rotator cuff repair      R shoulder, [place]  . Knee arthroscopy      Right Knee, [facility]  . Colonoscopy with polypectomy      Dr Sharlett Iles  . Prostate biopsy  09/06/14     FAMILY HISTORY: family history includes Alcohol abuse in his mother; Cirrhosis in his mother; Diabetes in his father and sister; Heart attack (age of onset: 9) in his father; Liver disease in his father; Multiple  myeloma in his brother. There is no history of Colon cancer, Stomach cancer, or Stroke.  Both of his parents died from alcohol abuse, his father at 47 and mother in her 10s.   SOCIAL HISTORY:  reports that he quit smoking about 47 years ago. His smoking use included Cigarettes. He has a 2.5 pack-year smoking history. He has never used smokeless tobacco. He reports that he drinks alcohol. He reports that he does not use illicit drugs. Remarried, 2 children.  Retired from [employer].  He is an avid Training and development officer.   ALLERGIES: Codeine   MEDICATIONS:  Current Outpatient Prescriptions  Medication Sig Dispense Refill  . Multiple Vitamin (MULTIVITAMIN) capsule Take 1 capsule by mouth daily.    . pravastatin (PRAVACHOL) 40 MG tablet Take 1 tablet (40 mg total) by mouth daily. 90 tablet 3  . VITAMIN D, CHOLECALCIFEROL, PO Take by mouth.     No current facility-administered medications for this encounter.     REVIEW OF SYSTEMS:  Pertinent items are noted in HPI.    PHYSICAL EXAM:  height is 5' 7.75" (1.721 m) and weight is 206 lb 11.2 oz (93.759 kg). His oral temperature is 98.3 F (36.8 C). His blood pressure is 129/76 and his pulse is 82. His respiration is 18.   Alert and oriented 69 year old white male appearing  younger than his stated age.  Rectal examination: Prostate gland is normal in size and is without focal induration or nodularity.   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  I explained to the patient  and his wife that his prognosis is related to his stage, PSA level, and Gleason score.  His stage and PSA level are favorable while his Gleason score of 7 is of intermediate favorability.  Prognostic factors include PSA doubling time and disease volume.  Management options include surgery versus active surveillance versus radiation therapy.  I would not advise active surveillance in view of his Gleason score of 7 (4+3) and excellent performance status.  Radiation therapy options include 5 weeks of external beam followed by seed implant boost versus 8 weeks of external beam/IMRT.  We discussed the potential acute and late toxicities of radiation therapy.  We'll also discussed bladder filling to minimize urinary toxicity during external beam radiation.  We briefly discussed short-term androgen deprivation therapy which is currently being investigated in a national trial.  I encouraged him to meet with one of the robotic surgeons for discussion of robotic prostatectomy.     PLAN: As discussed above.  He will contact me if he wants to proceed with radiation therapy.  He understands that we will need to have placement of 3 gold seed markers for image guidance.  I spent 60  minutes face to face with the patient and more than 50% of that time was spent in counseling and/or coordination of care.

## 2014-10-25 ENCOUNTER — Telehealth: Payer: Self-pay | Admitting: *Deleted

## 2014-10-25 NOTE — Telephone Encounter (Signed)
CALLED PATIENT TO INFORM OF GOLD SEED PLACEMENT ON 11/15/14- ARRIVAL TIME - 10 AM @ DR. DAHLSTEDT'S OFFICE AND HIS SIM ON 11-20-14 @ 9 AM @ DR. MURRAY'S OFFICE, SPOKE WITH PATIENT AND HE IS AWARE OF THESE APPTS.

## 2014-10-26 ENCOUNTER — Telehealth: Payer: Self-pay | Admitting: *Deleted

## 2014-10-26 NOTE — Telephone Encounter (Signed)
CALLED PATIENT TO ANSWER QUESTION, LVM FOR A RETURN CALL

## 2014-11-20 ENCOUNTER — Ambulatory Visit
Admission: RE | Admit: 2014-11-20 | Discharge: 2014-11-20 | Disposition: A | Discharge status: Home / Self Care | Payer: Medicare Other | Source: Ambulatory Visit | Attending: Radiation Oncology | Admitting: Radiation Oncology

## 2014-11-20 DIAGNOSIS — C61 Malignant neoplasm of prostate: Secondary | ICD-10-CM

## 2014-11-20 DIAGNOSIS — Z510 Encounter for antineoplastic radiation therapy: Secondary | ICD-10-CM | POA: Diagnosis not present

## 2014-11-20 NOTE — Progress Notes (Signed)
Complex simulation/treatment planning note: The patient was taken to the CT simulator.  He was placed supine.  A Vac lock immobilization device was constructed.  A red rubber tube was placed within the rectal vault.  He was then catheterized and contrast instilled into the bladder/urethra.  He was then scanned.  The CT data set was sent to the MIM planning system right contoured his prostate, seminal vesicles, rectum, bladder, and lower rectosigmoid colon.  I am prescribing 7800 cGy to his prostate PTV which represents the prostate +0.8 cm except for 0.5 cm along the rectum.  I am prescribing 5600 cGy in 40 sessions to his seminal vesicle PTV which represents the seminal vesicles +0.5 cm.  He is now ready for IMRT simulation/treatment planning.

## 2014-11-24 ENCOUNTER — Encounter: Payer: Self-pay | Admitting: Radiation Oncology

## 2014-11-24 NOTE — Progress Notes (Signed)
IMRT simulation/treatment planning note: The patient completed IMRT simulation/treatment planning in the management of his carcinoma the prostate.  IMRT was chosen to decrease the risk for both acute and late bladder and rectal toxicity compared to conventional or 3-D conformal radiation therapy.  Dose volume histograms were obtained for the target structures including the prostate PTV and seminal vesicle PTV.  We met our departmental goals.  We also obtained dose volume histograms for the avoidance structures including the bladder, rectum, and femoral heads.  We also met our target goals.  I prescribing 7800 cGy in 40 sessions to his prostate PTV and 5600 cGy in 40 sessions to his seminal vesicle PTV.  He is being treated with dual ARC VMAT IMRT with 6 MV photons.

## 2014-11-27 ENCOUNTER — Ambulatory Visit: Payer: Medicare Other

## 2014-11-28 ENCOUNTER — Ambulatory Visit: Payer: Medicare Other

## 2014-11-29 ENCOUNTER — Ambulatory Visit
Admission: RE | Admit: 2014-11-29 | Discharge: 2014-11-29 | Disposition: A | Discharge status: Home / Self Care | Payer: Medicare Other | Source: Ambulatory Visit | Attending: Radiation Oncology | Admitting: Radiation Oncology

## 2014-11-29 ENCOUNTER — Ambulatory Visit: Payer: Medicare Other

## 2014-11-29 DIAGNOSIS — Z510 Encounter for antineoplastic radiation therapy: Secondary | ICD-10-CM | POA: Diagnosis not present

## 2014-11-30 ENCOUNTER — Ambulatory Visit: Payer: Medicare Other

## 2014-11-30 ENCOUNTER — Ambulatory Visit
Admission: RE | Admit: 2014-11-30 | Discharge: 2014-11-30 | Disposition: A | Discharge status: Home / Self Care | Payer: Medicare Other | Source: Ambulatory Visit | Attending: Radiation Oncology | Admitting: Radiation Oncology

## 2014-11-30 DIAGNOSIS — Z510 Encounter for antineoplastic radiation therapy: Secondary | ICD-10-CM | POA: Diagnosis not present

## 2014-12-01 ENCOUNTER — Ambulatory Visit
Admission: RE | Admit: 2014-12-01 | Discharge: 2014-12-01 | Disposition: A | Discharge status: Home / Self Care | Payer: Medicare Other | Source: Ambulatory Visit | Attending: Radiation Oncology | Admitting: Radiation Oncology

## 2014-12-01 ENCOUNTER — Ambulatory Visit: Payer: Medicare Other

## 2014-12-01 DIAGNOSIS — Z510 Encounter for antineoplastic radiation therapy: Secondary | ICD-10-CM | POA: Diagnosis not present

## 2014-12-01 DIAGNOSIS — C61 Malignant neoplasm of prostate: Secondary | ICD-10-CM

## 2014-12-01 NOTE — Progress Notes (Signed)
Oriented patient to staff and routine of the clinic. Provided patient with RADIATION THERAPY AND YOU handbook then, reviewed pertinent information. Educated patient reference potential side effects and management such as, fatigue, urinary bladder changes, skin changes and diarrhea. Patient verbalized understanding of all reviewed.

## 2014-12-04 ENCOUNTER — Ambulatory Visit
Admission: RE | Admit: 2014-12-04 | Discharge: 2014-12-04 | Disposition: A | Discharge status: Home / Self Care | Payer: Medicare Other | Source: Ambulatory Visit | Attending: Radiation Oncology | Admitting: Radiation Oncology

## 2014-12-04 ENCOUNTER — Encounter: Payer: Self-pay | Admitting: Radiation Oncology

## 2014-12-04 ENCOUNTER — Ambulatory Visit: Payer: Medicare Other

## 2014-12-04 VITALS — BP 128/81 | HR 73 | Temp 98.6°F | Resp 12 | Ht 67.75 in | Wt 206.2 lb

## 2014-12-04 DIAGNOSIS — C61 Malignant neoplasm of prostate: Secondary | ICD-10-CM

## 2014-12-04 DIAGNOSIS — Z510 Encounter for antineoplastic radiation therapy: Secondary | ICD-10-CM | POA: Diagnosis not present

## 2014-12-04 NOTE — Progress Notes (Signed)
Weekly Management Note:  Site: Prostate Current Dose:  780  cGy Projected Dose: 7800  cGy  Narrative: The patient is seen today for routine under treatment assessment. CBCT/MVCT images/port films were reviewed. The chart was reviewed.   Bladder filling is excellent.  No GU or GI difficulties.  Physical Examination:  Filed Vitals:   12/04/14 1255  BP: 128/81  Pulse: 73  Temp: 98.6 F (37 C)  Resp: 12  .  Weight: 206 lb 3.2 oz (93.532 kg).  No change.  Impression: Tolerating radiation therapy well.  Plan: Continue radiation therapy as planned.

## 2014-12-04 NOTE — Progress Notes (Signed)
James [name] has completed 4 fractions to his prostate.  He denies pain, hematuria and dysuria.  He reports an increase an increase in urinary frequency.  He reports getting up 2 times per night to urinate.  He denies any bowel issues.    BP 128/81 mmHg  Pulse 73  Temp(Src) 98.6 F (37 C) (Oral)  Resp 12  Ht 5' 7.75" (1.721 m)  Wt 206 lb 3.2 oz (93.532 kg)  BMI 31.58 kg/m2

## 2014-12-05 ENCOUNTER — Ambulatory Visit
Admission: RE | Admit: 2014-12-05 | Discharge: 2014-12-05 | Disposition: A | Discharge status: Home / Self Care | Payer: Medicare Other | Source: Ambulatory Visit | Attending: Radiation Oncology | Admitting: Radiation Oncology

## 2014-12-05 ENCOUNTER — Ambulatory Visit: Payer: Medicare Other

## 2014-12-05 DIAGNOSIS — Z510 Encounter for antineoplastic radiation therapy: Secondary | ICD-10-CM | POA: Diagnosis not present

## 2014-12-06 ENCOUNTER — Ambulatory Visit
Admission: RE | Admit: 2014-12-06 | Discharge: 2014-12-06 | Disposition: A | Discharge status: Home / Self Care | Payer: Medicare Other | Source: Ambulatory Visit | Attending: Radiation Oncology | Admitting: Radiation Oncology

## 2014-12-06 ENCOUNTER — Ambulatory Visit: Payer: Medicare Other

## 2014-12-06 DIAGNOSIS — Z510 Encounter for antineoplastic radiation therapy: Secondary | ICD-10-CM | POA: Diagnosis not present

## 2014-12-07 ENCOUNTER — Ambulatory Visit: Payer: Medicare Other

## 2014-12-07 ENCOUNTER — Ambulatory Visit
Admission: RE | Admit: 2014-12-07 | Discharge: 2014-12-07 | Disposition: A | Discharge status: Home / Self Care | Payer: Medicare Other | Source: Ambulatory Visit | Attending: Radiation Oncology | Admitting: Radiation Oncology

## 2014-12-07 DIAGNOSIS — Z510 Encounter for antineoplastic radiation therapy: Secondary | ICD-10-CM | POA: Diagnosis not present

## 2014-12-08 ENCOUNTER — Ambulatory Visit
Admission: RE | Admit: 2014-12-08 | Discharge: 2014-12-08 | Disposition: A | Discharge status: Home / Self Care | Payer: Medicare Other | Source: Ambulatory Visit | Attending: Radiation Oncology | Admitting: Radiation Oncology

## 2014-12-08 DIAGNOSIS — Z510 Encounter for antineoplastic radiation therapy: Secondary | ICD-10-CM | POA: Diagnosis not present

## 2014-12-11 ENCOUNTER — Ambulatory Visit
Admission: RE | Admit: 2014-12-11 | Discharge: 2014-12-11 | Disposition: A | Discharge status: Home / Self Care | Payer: Medicare Other | Source: Ambulatory Visit | Attending: Radiation Oncology | Admitting: Radiation Oncology

## 2014-12-11 VITALS — BP 120/87 | HR 67 | Temp 97.8°F | Resp 12 | Wt 206.3 lb

## 2014-12-11 DIAGNOSIS — C61 Malignant neoplasm of prostate: Secondary | ICD-10-CM

## 2014-12-11 DIAGNOSIS — Z510 Encounter for antineoplastic radiation therapy: Secondary | ICD-10-CM | POA: Diagnosis not present

## 2014-12-11 NOTE — Progress Notes (Signed)
Weekly Management Note:  Site: Prostate Current Dose:  1755  cGy Projected Dose: 7800  cGy  Narrative: The patient is seen today for routine under treatment assessment. CBCT/MVCT images/port films were reviewed. The chart was reviewed.   Bladder filling is excellent.  No new GU or GI difficulties.  He had slight discomfort with ejaculation this past weekend.  He does have difficulty sleeping which she believes is from anxiety.  Physical Examination:  Filed Vitals:   12/11/14 0828  BP: 120/87  Pulse: 67  Temp: 97.8 F (36.6 C)  Resp: 12  .  Weight: 206 lb 4.8 oz (93.577 kg).  No change.  Impression: Tolerating radiation therapy well.  I told him that he will have thickening of his semen which may result in  painful ejaculation.  This should improve with time.  Plan: Continue radiation therapy as planned.

## 2014-12-11 NOTE — Progress Notes (Signed)
He is currently in no pain. Pt complains of fatigue and loss of sleep.  Reports he is having a difficult time staying asleep.  Is up multiple times from 12am-5am.   Pt reports urinary urgency. Pt states they urinate 1 - 2 times per night.  Pt reports Diarrhea -1 time a day. Denies rectal bleeding or soreness. Recommended taking Imodium as needed.  BP 120/87 mmHg  Pulse 67  Temp(Src) 97.8 F (36.6 C) (Oral)  Resp 12  Wt 206 lb 4.8 oz (93.577 kg)  SpO2 97%

## 2014-12-12 ENCOUNTER — Ambulatory Visit
Admission: RE | Admit: 2014-12-12 | Discharge: 2014-12-12 | Disposition: A | Discharge status: Home / Self Care | Payer: Medicare Other | Source: Ambulatory Visit | Attending: Radiation Oncology | Admitting: Radiation Oncology

## 2014-12-12 DIAGNOSIS — Z510 Encounter for antineoplastic radiation therapy: Secondary | ICD-10-CM | POA: Diagnosis not present

## 2014-12-13 ENCOUNTER — Ambulatory Visit
Admission: RE | Admit: 2014-12-13 | Discharge: 2014-12-13 | Disposition: A | Discharge status: Home / Self Care | Payer: Medicare Other | Source: Ambulatory Visit | Attending: Radiation Oncology | Admitting: Radiation Oncology

## 2014-12-13 DIAGNOSIS — Z510 Encounter for antineoplastic radiation therapy: Secondary | ICD-10-CM | POA: Diagnosis not present

## 2014-12-14 ENCOUNTER — Ambulatory Visit
Admission: RE | Admit: 2014-12-14 | Discharge: 2014-12-14 | Disposition: A | Discharge status: Home / Self Care | Payer: Medicare Other | Source: Ambulatory Visit | Attending: Radiation Oncology | Admitting: Radiation Oncology

## 2014-12-14 DIAGNOSIS — Z510 Encounter for antineoplastic radiation therapy: Secondary | ICD-10-CM | POA: Diagnosis not present

## 2014-12-15 ENCOUNTER — Ambulatory Visit
Admission: RE | Admit: 2014-12-15 | Discharge: 2014-12-15 | Disposition: A | Discharge status: Home / Self Care | Payer: Medicare Other | Source: Ambulatory Visit | Attending: Radiation Oncology | Admitting: Radiation Oncology

## 2014-12-15 DIAGNOSIS — Z510 Encounter for antineoplastic radiation therapy: Secondary | ICD-10-CM | POA: Diagnosis not present

## 2014-12-18 ENCOUNTER — Ambulatory Visit
Admission: RE | Admit: 2014-12-18 | Discharge: 2014-12-18 | Disposition: A | Discharge status: Home / Self Care | Payer: Medicare Other | Source: Ambulatory Visit | Attending: Radiation Oncology | Admitting: Radiation Oncology

## 2014-12-18 ENCOUNTER — Encounter: Payer: Self-pay | Admitting: Radiation Oncology

## 2014-12-18 DIAGNOSIS — Z510 Encounter for antineoplastic radiation therapy: Secondary | ICD-10-CM | POA: Diagnosis not present

## 2014-12-19 ENCOUNTER — Ambulatory Visit
Admission: RE | Admit: 2014-12-19 | Discharge: 2014-12-19 | Disposition: A | Discharge status: Home / Self Care | Payer: Medicare Other | Source: Ambulatory Visit | Attending: Radiation Oncology | Admitting: Radiation Oncology

## 2014-12-19 ENCOUNTER — Encounter: Payer: Self-pay | Admitting: Radiation Oncology

## 2014-12-19 VITALS — BP 128/82 | HR 76 | Temp 98.2°F | Resp 20 | Wt 205.5 lb

## 2014-12-19 DIAGNOSIS — C61 Malignant neoplasm of prostate: Secondary | ICD-10-CM

## 2014-12-19 DIAGNOSIS — Z510 Encounter for antineoplastic radiation therapy: Secondary | ICD-10-CM | POA: Diagnosis not present

## 2014-12-19 NOTE — Progress Notes (Signed)
Weekly rad txs 15/40 txs, some dysuria, frequency,urgency, nocturia q 2 hours, no heamturia, loose stools 4-5x day, gas, abdominal pain, takes maalox daily,appetite good,  8:37 AM

## 2014-12-19 NOTE — Progress Notes (Signed)
Weekly Management Note:  Site: Prostate Current Dose:  2925  cGy Projected Dose: 7800  cGy  Narrative: The patient is seen today for routine under treatment assessment. CBCT/MVCT images/port films were reviewed. The chart was reviewed.   Bladder filling is excellent.  He does report nocturia 4-5.  His baseline is 1-2.  He feels that his urinary stream is strong most of the time.  He does have frequent loose bowel movements which are not bothersome.  His preradiation I PSS score was 4.  Physical Examination:  Filed Vitals:   12/19/14 0841  BP: 128/82  Pulse: 76  Temp: 98.2 F (36.8 C)  Resp: 20  .  Weight: 205 lb 8 oz (93.214 kg).  No change.  Impression: Tolerating radiation therapy well except for nocturia 3-4.  He had a good I PSS score progress radiation therapy and I'm not sure if he is having obstructive symptoms or irritative symptoms.  We could try tamsulosin in the future if his frequency/nocturia worsens.  He is not significantly bothered at this time.  Plan: Continue radiation therapy as planned.

## 2014-12-20 ENCOUNTER — Ambulatory Visit
Admission: RE | Admit: 2014-12-20 | Discharge: 2014-12-20 | Disposition: A | Discharge status: Home / Self Care | Payer: Medicare Other | Source: Ambulatory Visit | Attending: Radiation Oncology | Admitting: Radiation Oncology

## 2014-12-20 DIAGNOSIS — Z510 Encounter for antineoplastic radiation therapy: Secondary | ICD-10-CM | POA: Diagnosis not present

## 2014-12-21 ENCOUNTER — Ambulatory Visit
Admission: RE | Admit: 2014-12-21 | Discharge: 2014-12-21 | Disposition: A | Discharge status: Home / Self Care | Payer: Medicare Other | Source: Ambulatory Visit | Attending: Radiation Oncology | Admitting: Radiation Oncology

## 2014-12-21 DIAGNOSIS — Z510 Encounter for antineoplastic radiation therapy: Secondary | ICD-10-CM | POA: Diagnosis not present

## 2014-12-22 ENCOUNTER — Ambulatory Visit
Admission: RE | Admit: 2014-12-22 | Discharge: 2014-12-22 | Disposition: A | Discharge status: Home / Self Care | Payer: Medicare Other | Source: Ambulatory Visit | Attending: Radiation Oncology | Admitting: Radiation Oncology

## 2014-12-22 DIAGNOSIS — Z510 Encounter for antineoplastic radiation therapy: Secondary | ICD-10-CM | POA: Diagnosis not present

## 2014-12-25 ENCOUNTER — Encounter: Payer: Self-pay | Admitting: Radiation Oncology

## 2014-12-25 ENCOUNTER — Ambulatory Visit
Admission: RE | Admit: 2014-12-25 | Discharge: 2014-12-25 | Disposition: A | Discharge status: Home / Self Care | Payer: Medicare Other | Source: Ambulatory Visit | Attending: Radiation Oncology | Admitting: Radiation Oncology

## 2014-12-25 VITALS — BP 126/73 | HR 75 | Temp 98.2°F | Resp 16 | Wt 206.1 lb

## 2014-12-25 DIAGNOSIS — Z510 Encounter for antineoplastic radiation therapy: Secondary | ICD-10-CM | POA: Diagnosis not present

## 2014-12-25 DIAGNOSIS — C61 Malignant neoplasm of prostate: Secondary | ICD-10-CM

## 2014-12-25 NOTE — Progress Notes (Signed)
Reports urinary frequency every hour during the day. Reports nocturia x1. Reports small frequent stools. Reports increased gas. Reports tingling with urination. Reports urgency. Reports strong steady urine stream. Denies mild fatigue. Weight and vitals stable.

## 2014-12-25 NOTE — Progress Notes (Signed)
Weekly Management Note:  Site: Prostate Current Dose:  3705  cGy Projected Dose: 7800  cGy  Narrative: The patient is seen today for routine under treatment assessment. CBCT/MVCT images/port films were reviewed. The chart was reviewed.   Bladder filling is satisfactory.  No new GU or GI difficulties.  Physical Examination:  Filed Vitals:   12/25/14 0829  BP: 126/73  Pulse: 75  Temp: 98.2 F (36.8 C)  Resp: 16  .  Weight: 206 lb 1.6 oz (93.486 kg).  No change.  Impression: Tolerating radiation therapy well.  Plan: Continue radiation therapy as planned.

## 2014-12-26 ENCOUNTER — Ambulatory Visit
Admission: RE | Admit: 2014-12-26 | Discharge: 2014-12-26 | Disposition: A | Discharge status: Home / Self Care | Payer: Medicare Other | Source: Ambulatory Visit | Attending: Radiation Oncology | Admitting: Radiation Oncology

## 2014-12-26 DIAGNOSIS — Z510 Encounter for antineoplastic radiation therapy: Secondary | ICD-10-CM | POA: Diagnosis not present

## 2014-12-27 ENCOUNTER — Ambulatory Visit
Admission: RE | Admit: 2014-12-27 | Discharge: 2014-12-27 | Disposition: A | Discharge status: Home / Self Care | Payer: Medicare Other | Source: Ambulatory Visit | Attending: Radiation Oncology | Admitting: Radiation Oncology

## 2014-12-27 DIAGNOSIS — Z510 Encounter for antineoplastic radiation therapy: Secondary | ICD-10-CM | POA: Diagnosis not present

## 2014-12-28 ENCOUNTER — Ambulatory Visit
Admission: RE | Admit: 2014-12-28 | Discharge: 2014-12-28 | Disposition: A | Discharge status: Home / Self Care | Payer: Medicare Other | Source: Ambulatory Visit | Attending: Radiation Oncology | Admitting: Radiation Oncology

## 2014-12-28 DIAGNOSIS — Z510 Encounter for antineoplastic radiation therapy: Secondary | ICD-10-CM | POA: Diagnosis not present

## 2014-12-29 ENCOUNTER — Ambulatory Visit
Admission: RE | Admit: 2014-12-29 | Discharge: 2014-12-29 | Disposition: A | Discharge status: Home / Self Care | Payer: Medicare Other | Source: Ambulatory Visit | Attending: Radiation Oncology | Admitting: Radiation Oncology

## 2014-12-29 DIAGNOSIS — Z510 Encounter for antineoplastic radiation therapy: Secondary | ICD-10-CM | POA: Diagnosis not present

## 2015-01-01 ENCOUNTER — Encounter: Payer: Self-pay | Admitting: Radiation Oncology

## 2015-01-01 ENCOUNTER — Ambulatory Visit
Admission: RE | Admit: 2015-01-01 | Discharge: 2015-01-01 | Disposition: A | Discharge status: Home / Self Care | Payer: Medicare Other | Source: Ambulatory Visit | Attending: Radiation Oncology | Admitting: Radiation Oncology

## 2015-01-01 VITALS — BP 130/75 | HR 80 | Temp 99.4°F | Resp 12 | Wt 202.1 lb

## 2015-01-01 DIAGNOSIS — Z510 Encounter for antineoplastic radiation therapy: Secondary | ICD-10-CM | POA: Diagnosis not present

## 2015-01-01 DIAGNOSIS — C61 Malignant neoplasm of prostate: Secondary | ICD-10-CM

## 2015-01-01 NOTE — Progress Notes (Signed)
He is currently in no pain.  Pt complains of fatigue, loss of sleep and poor appetite, "I'm not hungry." Pt reports urinary frequency, urgency and pain with urination. Pt states they urinate 1 - 2 times per night.  Pt reports Diarrhea 3-4 times a day. Reports rectal tenderness and irritation, denies rectal bleeding.  Recommended spray bottle moistened toilet paper to cleanse rectal area.  Encouraged Imodium if diarrhea continues.   BP 130/75 mmHg  Pulse 80  Temp(Src) 99.4 F (37.4 C) (Oral)  Resp 12  Wt 202 lb 1.6 oz (91.672 kg)  SpO2 99%

## 2015-01-01 NOTE — Progress Notes (Signed)
Weekly Management Note:  Site: Prostate Current Dose:  4680  cGy Projected Dose: 7800  cGy  Narrative: The patient is seen today for routine under treatment assessment. CBCT/MVCT images/port films were reviewed. The chart was reviewed.    He is currently in no pain. He has some rectal tenderness and diarrhea. He has urinary frequency, urgency, and discomfort with voiding. Reports all sx are syable from last week. Eager to leave to play golf.      BP 130/75 mmHg  Pulse 80  Temp(Src) 99.4 F (37.4 C) (Oral)  Resp 12  Wt 202 lb 1.6 oz (91.672 kg)  SpO2 99% NAD  Physical Examination:  Filed Vitals:   01/01/15 0838  BP: 130/75  Pulse: 80  Temp: 99.4 F (37.4 C)  Resp: 12  .  Weight: 202 lb 1.6 oz (91.672 kg).  No change.  Impression: Tolerating radiation therapy well.  Plan: Continue radiation therapy as planned. Consider nutrition referral if wt loss continues. [place] wipes rec'd. -----------------------------------  Eppie Gibson, MD

## 2015-01-02 ENCOUNTER — Ambulatory Visit
Admission: RE | Admit: 2015-01-02 | Discharge: 2015-01-02 | Disposition: A | Discharge status: Home / Self Care | Payer: Medicare Other | Source: Ambulatory Visit | Attending: Radiation Oncology | Admitting: Radiation Oncology

## 2015-01-02 DIAGNOSIS — Z510 Encounter for antineoplastic radiation therapy: Secondary | ICD-10-CM | POA: Diagnosis not present

## 2015-01-03 ENCOUNTER — Ambulatory Visit
Admission: RE | Admit: 2015-01-03 | Discharge: 2015-01-03 | Disposition: A | Discharge status: Home / Self Care | Payer: Medicare Other | Source: Ambulatory Visit | Attending: Radiation Oncology | Admitting: Radiation Oncology

## 2015-01-03 DIAGNOSIS — Z510 Encounter for antineoplastic radiation therapy: Secondary | ICD-10-CM | POA: Diagnosis not present

## 2015-01-04 ENCOUNTER — Ambulatory Visit
Admission: RE | Admit: 2015-01-04 | Discharge: 2015-01-04 | Disposition: A | Discharge status: Home / Self Care | Payer: Medicare Other | Source: Ambulatory Visit | Attending: Radiation Oncology | Admitting: Radiation Oncology

## 2015-01-04 DIAGNOSIS — Z510 Encounter for antineoplastic radiation therapy: Secondary | ICD-10-CM | POA: Diagnosis not present

## 2015-01-05 ENCOUNTER — Ambulatory Visit
Admission: RE | Admit: 2015-01-05 | Discharge: 2015-01-05 | Disposition: A | Discharge status: Home / Self Care | Payer: Medicare Other | Source: Ambulatory Visit | Attending: Radiation Oncology | Admitting: Radiation Oncology

## 2015-01-05 DIAGNOSIS — Z510 Encounter for antineoplastic radiation therapy: Secondary | ICD-10-CM | POA: Diagnosis not present

## 2015-01-08 ENCOUNTER — Ambulatory Visit
Admission: RE | Admit: 2015-01-08 | Discharge: 2015-01-08 | Disposition: A | Discharge status: Home / Self Care | Payer: Medicare Other | Source: Ambulatory Visit | Attending: Radiation Oncology | Admitting: Radiation Oncology

## 2015-01-08 ENCOUNTER — Encounter: Payer: Self-pay | Admitting: Radiation Oncology

## 2015-01-08 VITALS — BP 125/76 | HR 79 | Temp 98.6°F | Resp 20 | Ht 67.5 in | Wt 201.8 lb

## 2015-01-08 DIAGNOSIS — C61 Malignant neoplasm of prostate: Secondary | ICD-10-CM

## 2015-01-08 DIAGNOSIS — Z510 Encounter for antineoplastic radiation therapy: Secondary | ICD-10-CM | POA: Diagnosis not present

## 2015-01-08 NOTE — Progress Notes (Signed)
James [name] has completed 29 fractions to his prostate.  He denies pain, dysuria and fatigue.  He reports urinating frequently.  He is getting up once per night to urinate.  He reports having 2-3 loose stools per day.  He denies hematuria.  He reports that he does not have much of an appetite.  He has lost 4 lbs since 3/15.  BP 125/76 mmHg  Pulse 79  Temp(Src) 98.6 F (37 C) (Oral)  Resp 20  Ht 5' 7.5" (1.715 m)  Wt 201 lb 12.8 oz (91.536 kg)  BMI 31.12 kg/m2

## 2015-01-08 NOTE — Progress Notes (Signed)
Weekly Management Note:  Site: Prostate Current Dose:  5655  cGy Projected Dose: 7800  cGy  Narrative: The patient is seen today for routine under treatment assessment. CBCT/MVCT images/port films were reviewed. The chart was reviewed.   Bladder filling is satisfactory.  No new GU or GI difficulties.  He does have 2-3 loose bowels a day.  He will play golf today and tennis tomorrow.  Physical Examination:  Filed Vitals:   01/08/15 0837  BP: 125/76  Pulse: 79  Temp: 98.6 F (37 C)  Resp: 20  .  Weight: 201 lb 12.8 oz (91.536 kg).  No change.  Impression: Tolerating radiation therapy well.  Plan: Continue radiation therapy as planned.

## 2015-01-09 ENCOUNTER — Ambulatory Visit
Admission: RE | Admit: 2015-01-09 | Discharge: 2015-01-09 | Disposition: A | Discharge status: Home / Self Care | Payer: Medicare Other | Source: Ambulatory Visit | Attending: Radiation Oncology | Admitting: Radiation Oncology

## 2015-01-09 DIAGNOSIS — Z510 Encounter for antineoplastic radiation therapy: Secondary | ICD-10-CM | POA: Diagnosis not present

## 2015-01-10 ENCOUNTER — Ambulatory Visit
Admission: RE | Admit: 2015-01-10 | Discharge: 2015-01-10 | Disposition: A | Discharge status: Home / Self Care | Payer: Medicare Other | Source: Ambulatory Visit | Attending: Radiation Oncology | Admitting: Radiation Oncology

## 2015-01-10 DIAGNOSIS — Z510 Encounter for antineoplastic radiation therapy: Secondary | ICD-10-CM | POA: Diagnosis not present

## 2015-01-11 ENCOUNTER — Ambulatory Visit
Admission: RE | Admit: 2015-01-11 | Discharge: 2015-01-11 | Disposition: A | Discharge status: Home / Self Care | Payer: Medicare Other | Source: Ambulatory Visit | Attending: Radiation Oncology | Admitting: Radiation Oncology

## 2015-01-11 DIAGNOSIS — Z510 Encounter for antineoplastic radiation therapy: Secondary | ICD-10-CM | POA: Diagnosis not present

## 2015-01-12 ENCOUNTER — Ambulatory Visit
Admission: RE | Admit: 2015-01-12 | Discharge: 2015-01-12 | Disposition: A | Discharge status: Home / Self Care | Payer: Medicare Other | Source: Ambulatory Visit | Attending: Radiation Oncology | Admitting: Radiation Oncology

## 2015-01-12 DIAGNOSIS — Z510 Encounter for antineoplastic radiation therapy: Secondary | ICD-10-CM | POA: Diagnosis not present

## 2015-01-15 ENCOUNTER — Ambulatory Visit
Admission: RE | Admit: 2015-01-15 | Discharge: 2015-01-15 | Disposition: A | Discharge status: Home / Self Care | Payer: Medicare Other | Source: Ambulatory Visit | Attending: Radiation Oncology | Admitting: Radiation Oncology

## 2015-01-15 VITALS — BP 125/76 | HR 68 | Resp 16 | Wt 204.3 lb

## 2015-01-15 DIAGNOSIS — Z510 Encounter for antineoplastic radiation therapy: Secondary | ICD-10-CM | POA: Diagnosis not present

## 2015-01-15 DIAGNOSIS — C61 Malignant neoplasm of prostate: Secondary | ICD-10-CM

## 2015-01-15 NOTE — Progress Notes (Signed)
Weekly Management Note:  Site: Prostate Current Dose:  6630  cGy Projected Dose: 7800  cGy  Narrative: The patient is seen today for routine under treatment assessment. CBCT/MVCT images/port films were reviewed. The chart was reviewed.   Bladder filling satisfactory.  He does report some slowing of the stream but no difficulty emptying or hesitancy.  No GI difficulties.  Physical Examination:  Filed Vitals:   01/15/15 0818  BP: 125/76  Pulse: 68  Resp: 16  .  Weight: 204 lb 4.8 oz (92.67 kg).  No change.  Impression: Tolerating radiation therapy well.  Plan: Continue radiation therapy as planned.

## 2015-01-15 NOTE — Progress Notes (Signed)
Reports discomfort with urination. Reports nocturia x1. Denies diarrhea. Reports steady stream. Denies difficulty emptying or incontinence. Reports urgency. Reports frequency during the day approximately every hour. Denies hematuria. Reports fatigue.

## 2015-01-16 ENCOUNTER — Ambulatory Visit
Admission: RE | Admit: 2015-01-16 | Discharge: 2015-01-16 | Disposition: A | Discharge status: Home / Self Care | Payer: Medicare Other | Source: Ambulatory Visit | Attending: Radiation Oncology | Admitting: Radiation Oncology

## 2015-01-16 DIAGNOSIS — Z510 Encounter for antineoplastic radiation therapy: Secondary | ICD-10-CM | POA: Diagnosis not present

## 2015-01-17 ENCOUNTER — Ambulatory Visit
Admission: RE | Admit: 2015-01-17 | Discharge: 2015-01-17 | Disposition: A | Discharge status: Home / Self Care | Payer: Medicare Other | Source: Ambulatory Visit | Attending: Radiation Oncology | Admitting: Radiation Oncology

## 2015-01-17 DIAGNOSIS — Z510 Encounter for antineoplastic radiation therapy: Secondary | ICD-10-CM | POA: Diagnosis not present

## 2015-01-18 ENCOUNTER — Ambulatory Visit
Admission: RE | Admit: 2015-01-18 | Discharge: 2015-01-18 | Disposition: A | Discharge status: Home / Self Care | Payer: Medicare Other | Source: Ambulatory Visit | Attending: Radiation Oncology | Admitting: Radiation Oncology

## 2015-01-18 ENCOUNTER — Encounter: Payer: Medicare Other | Admitting: Internal Medicine

## 2015-01-18 DIAGNOSIS — Z510 Encounter for antineoplastic radiation therapy: Secondary | ICD-10-CM | POA: Diagnosis not present

## 2015-01-19 ENCOUNTER — Ambulatory Visit
Admission: RE | Admit: 2015-01-19 | Discharge: 2015-01-19 | Disposition: A | Discharge status: Home / Self Care | Payer: Medicare Other | Source: Ambulatory Visit | Attending: Radiation Oncology | Admitting: Radiation Oncology

## 2015-01-19 DIAGNOSIS — Z510 Encounter for antineoplastic radiation therapy: Secondary | ICD-10-CM | POA: Diagnosis not present

## 2015-01-22 ENCOUNTER — Ambulatory Visit
Admission: RE | Admit: 2015-01-22 | Discharge: 2015-01-22 | Disposition: A | Discharge status: Home / Self Care | Payer: Medicare Other | Source: Ambulatory Visit | Attending: Radiation Oncology | Admitting: Radiation Oncology

## 2015-01-22 ENCOUNTER — Encounter: Payer: Self-pay | Admitting: Radiation Oncology

## 2015-01-22 VITALS — BP 127/76 | HR 76 | Resp 16 | Wt 203.1 lb

## 2015-01-22 DIAGNOSIS — C61 Malignant neoplasm of prostate: Secondary | ICD-10-CM | POA: Insufficient documentation

## 2015-01-22 NOTE — Progress Notes (Signed)
Weekly Management Note:  Site: Prostate Current Dose:  7605  cGy Projected Dose: 7800  cGy  Narrative: The patient is seen today for routine under treatment assessment. CBCT/MVCT images/port films were reviewed. The chart was reviewed.   Bladder filling is satisfactory.  He finishes his radiation therapy tomorrow.  He is doing well from a GU and GI standpoint.  Physical Examination:  Filed Vitals:   01/22/15 0811  BP: 127/76  Pulse: 76  Resp: 16  .  Weight: 203 lb 1.6 oz (92.126 kg).  No change.  Impression: Tolerating radiation therapy well.  Plan: Continue radiation therapy as planned.

## 2015-01-22 NOTE — Progress Notes (Signed)
Patient completes treatment tomorrow. One month follow up appointment card given. Reports nocturia x1. Reports mild fatigue. Reports urination is uncomfortable but, doesn't burn. Denies hematuria. Denies diarrhea. Reports frequent bowel movements with small stools. Denies pain. Weight and vitals stable. Follow up with [facility] arranged for next month.

## 2015-01-23 ENCOUNTER — Ambulatory Visit
Admission: RE | Admit: 2015-01-23 | Discharge: 2015-01-23 | Disposition: A | Discharge status: Home / Self Care | Payer: Medicare Other | Source: Ambulatory Visit | Attending: Radiation Oncology | Admitting: Radiation Oncology

## 2015-01-23 ENCOUNTER — Encounter: Payer: Self-pay | Admitting: Radiation Oncology

## 2015-01-23 DIAGNOSIS — C61 Malignant neoplasm of prostate: Secondary | ICD-10-CM | POA: Diagnosis not present

## 2015-01-23 NOTE — Progress Notes (Signed)
Chart note: James [name] began his external beam/IMRT on 11/29/2014.  He was set up with 2 sets a dynamic MLCs (dual ARC VMAT IMRT) representing one set of IMRT treatment devices (99242).

## 2015-01-23 NOTE — Progress Notes (Signed)
[facility] End of Treatment Note  Name:Rulon JOBANY MONTELLANO  Date: 01/23/2015 SJG:[id number] DOB:May 10, 1946   Status:outpatient    CC: [name], MD  Dr. Franchot Gallo  REFERRING PHYSICIAN:   Dr. 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 Ashkar tolerated his treatment well without significant GU or GI toxicity by completion of therapy.                          PLAN: Routine followup in one month. Patient instructed to call if questions or worsening complaints in interim.

## 2015-02-21 ENCOUNTER — Encounter: Payer: Self-pay | Admitting: Radiation Oncology

## 2015-02-22 ENCOUNTER — Ambulatory Visit
Admission: RE | Admit: 2015-02-22 | Discharge: 2015-02-22 | Disposition: A | Discharge status: Home / Self Care | Payer: Medicare Other | Source: Ambulatory Visit | Attending: Radiation Oncology | Admitting: Radiation Oncology

## 2015-02-22 ENCOUNTER — Encounter: Payer: Self-pay | Admitting: Radiation Oncology

## 2015-02-22 VITALS — BP 127/75 | HR 79 | Temp 98.9°F | Ht 67.75 in | Wt 200.4 lb

## 2015-02-22 DIAGNOSIS — C61 Malignant neoplasm of prostate: Secondary | ICD-10-CM

## 2015-02-22 HISTORY — DX: Personal history of irradiation: Z92.3

## 2015-02-22 NOTE — Progress Notes (Addendum)
Mr. Senger reports states intermittent urinary urgency when standing and at time he has to void within 15 minutes after initital void.  He reports "that at times he will have discharge from the rectal area, skid marks".  He reports that when he awakens in the morning he has tightness across his chest occassionally.  Encouraged him tom call his PCP.

## 2015-02-22 NOTE — Progress Notes (Signed)
CC: Dr. Franchot Gallo, Dr. [name]  Follow-up note:  James [name] returns today approximately 5 weeks following completion of external beam/IMRT in the management of his stage TIc intermediate risk adenocarcinoma prostate.  His fatigue is much improved.  He feels that his GU and GI habits have almost returned to baseline.  He does have occasional urinary urgency, but a good force of stream.  He has noted  occasional  mucus discharge per rectum.  No rectal bleeding.  He also tells me that he is been having occasional dull left anterior chest discomfort which is not related to exertion.  It may last from 5-30 minutes.  No diaphoresis or associated dyspnea.  His chest discomfort is becoming less frequent.  He feels that the discomfort is probably related to his stomach.  He has been active playing tennis without difficulty.  He plans on bringing this up to Dr. Linna Darner when he sees him again in 6 weeks.  Physical examination: Alert and oriented. Filed Vitals:   02/22/15 1304  BP: 127/75  Pulse: 79  Temp: 98.9 F (37.2 C)   Rectal examination not performed today.  Impression: Satisfactory progress.  He is rectal discharge is probably related to acute, subclinical proctitis which should resolve in the near future.  I told him that he should see Dr. Linna Darner sooner if his chest discomfort becomes more frequent.  He will see Dr. Diona Fanti for a follow-up visit next week.  Dr. Diona Fanti we'll monitor his PSA determinations.  Plan: Follow-up through Dr. Diona Fanti.  I've not scheduled the patient for a formal follow-up visit, but I would more than happy to see me future should the need arise.  He'll see Dr. Linna Darner for a follow-up visit in approximately 6 weeks.

## 2015-02-26 ENCOUNTER — Other Ambulatory Visit (INDEPENDENT_AMBULATORY_CARE_PROVIDER_SITE_OTHER): Payer: Medicare Other

## 2015-02-26 ENCOUNTER — Encounter: Payer: Self-pay | Admitting: Internal Medicine

## 2015-02-26 ENCOUNTER — Ambulatory Visit (INDEPENDENT_AMBULATORY_CARE_PROVIDER_SITE_OTHER): Payer: Medicare Other | Admitting: Internal Medicine

## 2015-02-26 VITALS — BP 118/62 | HR 92 | Temp 98.7°F | Wt 197.0 lb

## 2015-02-26 DIAGNOSIS — J209 Acute bronchitis, unspecified: Secondary | ICD-10-CM | POA: Diagnosis not present

## 2015-02-26 DIAGNOSIS — K2100 Gastro-esophageal reflux disease with esophagitis, without bleeding: Secondary | ICD-10-CM

## 2015-02-26 DIAGNOSIS — E785 Hyperlipidemia, unspecified: Secondary | ICD-10-CM

## 2015-02-26 DIAGNOSIS — R079 Chest pain, unspecified: Secondary | ICD-10-CM | POA: Diagnosis not present

## 2015-02-26 LAB — HEPATIC FUNCTION PANEL
ALBUMIN: 3.6 g/dL (ref 3.5–5.2)
ALK PHOS: 68 U/L (ref 39–117)
ALT: 5 U/L (ref 0–53)
AST: 11 U/L (ref 0–37)
Bilirubin, Direct: 0.2 mg/dL (ref 0.0–0.3)
Total Bilirubin: 0.5 mg/dL (ref 0.2–1.2)
Total Protein: 7.2 g/dL (ref 6.0–8.3)

## 2015-02-26 LAB — LIPID PANEL
Cholesterol: 116 mg/dL (ref 0–200)
HDL: 37.5 mg/dL — ABNORMAL LOW (ref >39.00)
LDL CALC: 68 mg/dL (ref 0–99)
NonHDL: 78.5
Total CHOL/HDL Ratio: 3
Triglycerides: 51 mg/dL (ref 0.0–149.0)
VLDL: 10.2 mg/dL (ref 0.0–40.0)

## 2015-02-26 MED ORDER — RANITIDINE HCL 150 MG PO TABS: 150.0000 mg | ORAL_TABLET | Freq: Two times a day (BID) | ORAL | Status: DC

## 2015-02-26 MED ORDER — AZITHROMYCIN 250 MG PO TABS: ORAL_TABLET | ORAL | Status: DC

## 2015-02-26 MED ORDER — PRAVASTATIN SODIUM 40 MG PO TABS: 40.0000 mg | ORAL_TABLET | Freq: Every day | ORAL | Status: DC

## 2015-02-26 NOTE — Progress Notes (Signed)
Pre visit review using our clinic review tool, if applicable. No additional management support is needed unless otherwise documented below in the visit note. 

## 2015-02-26 NOTE — Progress Notes (Signed)
   Subjective:    Patient ID: James [name], male    DOB: 04-Oct-1946, 69 y.o.   MRN: [id number]  HPI He has chest discomfort which is described as dull and lasting a few minutes. It is nonexertional. It typically will occur in the morning ;but it can occasionally  occur an hour after eating. He has some anorexia. He has vomited only with gagging. He has a past medical history of bleeding ulcer He drinks occasionally. Has to 3 cups coffee daily. He has 1-4 diet colas per day.    He has completed 40 radiation treatments for prostate cancer. He continues to have low energy.  He also describes some bronchitic symptoms. Describes a cough for the last week with clear sputum. He has no extrinsic symptoms unless he works in the yard. This occurs even though he wears a mask. He has chills in the morning and sweats at night.  Review of Systems Unexplained weight loss, abdominal pain, significant dyspepsia, dysphagia, melena, rectal bleeding, or persistently small caliber stools are denied.  Frontal headache, facial pain , nasal purulence, dental pain, sore throat , otic pain or otic discharge denied. No fever , chills or sweats.    Objective:   Physical Exam  Pertinent or positive findings include:  He exhibits intermittent dry cough. He has scattered seborrheic keratoses over the thorax.  General appearance :adequately nourished; in no distress. Eyes: No conjunctival inflammation or scleral icterus is present. Oral exam:  Lips and gums are healthy appearing.There is no oropharyngeal erythema or exudate noted. Dental hygiene is good. Heart:  Normal rate and regular rhythm. S1 and S2 normal without gallop, murmur, click, rub or other extra sounds   Lungs:Chest clear to auscultation; no wheezes, rhonchi,rales ,or rubs present.No increased work of breathing.  Abdomen: bowel sounds normal, soft and non-tender without masses, organomegaly or hernias noted.  No guarding or rebound. Vascular : all  pulses equal ; no bruits present. Skin:Warm & dry.  Intact without suspicious lesions or rashes ; no tenting or jaundice  Lymphatic: No lymphadenopathy is noted about the head, neck, axilla Neuro: Strength, tone & DTRs normal.      Assessment & Plan:  #1 chest pain most likely related to reflux with esophagitis  #2 bronchitis  Plan: See orders and recommendations

## 2015-02-26 NOTE — Patient Instructions (Signed)
Reflux of gastric acid may be asymptomatic as this may occur mainly during sleep.The triggers for reflux  include stress; the "aspirin family" ; alcohol; peppermint; and caffeine (coffee, tea, cola, and chocolate). The aspirin family would include aspirin and the nonsteroidal agents such as ibuprofen &  Naproxen. Tylenol would not cause reflux. If having symptoms ; food & drink should be avoided for @ least 2 hours before going to bed.  

## 2015-04-03 LAB — BASIC METABOLIC PANEL
CREATININE: 0.8 mg/dL (ref 0.6–1.3)
GLUCOSE: 99 mg/dL
SODIUM: 138 mmol/L (ref 137–147)

## 2015-05-08 LAB — CBC AND DIFFERENTIAL
Neutrophils Absolute: 6 /uL
WBC: 8.2 10*3/mL

## 2015-05-28 ENCOUNTER — Ambulatory Visit (INDEPENDENT_AMBULATORY_CARE_PROVIDER_SITE_OTHER): Payer: Medicare Other | Admitting: Internal Medicine

## 2015-05-28 ENCOUNTER — Encounter: Payer: Self-pay | Admitting: Internal Medicine

## 2015-05-28 VITALS — BP 118/74 | HR 83 | Temp 98.6°F | Resp 16 | Wt 192.0 lb

## 2015-05-28 DIAGNOSIS — R1314 Dysphagia, pharyngoesophageal phase: Secondary | ICD-10-CM | POA: Diagnosis not present

## 2015-05-28 DIAGNOSIS — D509 Iron deficiency anemia, unspecified: Secondary | ICD-10-CM | POA: Diagnosis not present

## 2015-05-28 DIAGNOSIS — Z860100 Personal history of colon polyps, unspecified: Secondary | ICD-10-CM

## 2015-05-28 DIAGNOSIS — R10814 Left lower quadrant abdominal tenderness: Secondary | ICD-10-CM | POA: Diagnosis not present

## 2015-05-28 NOTE — Patient Instructions (Addendum)
The GI referral will be scheduled and you'll be notified of the time.Please call the Referral Co-Ordinator @ [phone] if you have not been notified of appointment time within 7-10 days.

## 2015-05-28 NOTE — Progress Notes (Signed)
Pre visit review using our clinic review tool, if applicable. No additional management support is needed unless otherwise documented below in the visit note. 

## 2015-05-28 NOTE — Progress Notes (Signed)
   Subjective:    Patient ID: James [name], male    DOB: 1945/11/01, 69 y.o.   MRN: [id number]  HPI   His hemoglobin and hematocrit at the [facility] 04/03/15 was 11.3 and 36. B12 level at that time was 346. Repeat hemoglobin and hematocrit [facility] 05/08/15 were 10.2 and 32.5 respectively. MCV was 78.1. B12 level was repeated was 271. Iron level was 24 with normals greater than 54.  He's found ranitidine for reflux symptoms effective. He has dysphagia approximately once a week. He also describes easy bruising.  He has a PMH of bleeding ulcer in 1969. He received a transfusion of 1 unit of packed cells.  In May 2013 2 tubular adenomas were resected by Dr. Sharlett Iles.  He completed radiation for prostate cancer in April of this year.    Review of Systems Epistaxis, hemoptysis, hematuria, melena, or rectal bleeding denied. Even with the iron supplement he has had no dark stool. No unexplained weight loss, significant dyspepsia, or abdominal pain.  There is no abnormal bleeding, or difficulty stopping bleeding with injury. He does describe easy bruising.     Objective:   Physical Exam  Pertinent or positive findings include: Grade 1 systolic murmur is present. He has some tenderness in the left lower quadrant. Multiple keratotic lesions are present over the posterior thorax.  General appearance :adequately nourished; in no distress.  Eyes: No conjunctival inflammation or scleral icterus is present.  Oral exam:  Lips and gums are healthy appearing.There is no oropharyngeal erythema or exudate noted. Dental hygiene is good.  Heart:  Normal rate and regular rhythm. S1 and S2 normal without gallop,click, rub or other extra sounds    Lungs:Chest clear to auscultation; no wheezes, rhonchi,rales ,or rubs present.No increased work of breathing.   Abdomen: bowel sounds normal, soft but tender as noted in the left lower quadrant without masses, organomegaly or hernias noted.  No guarding or rebound.    Vascular : all pulses equal ; no bruits present.  Skin:Warm & dry.  Intact without suspicious lesions or rashes ; no tenting or jaundice   Lymphatic: No lymphadenopathy is noted about the head, neck, axilla.   Neuro: Strength, tone & DTRs normal.        Assessment & Plan:  #1 anemia , iron deficient  #2 dysphagia, weekly  #3 tenderness left lower quadrant  #4history of tubular adenomas  #5 history of radiation for prostate cancer  Plan: GI referral

## 2015-05-29 ENCOUNTER — Encounter: Payer: Self-pay | Admitting: Internal Medicine

## 2015-05-30 ENCOUNTER — Encounter: Payer: Self-pay | Admitting: Internal Medicine

## 2015-05-30 ENCOUNTER — Ambulatory Visit (INDEPENDENT_AMBULATORY_CARE_PROVIDER_SITE_OTHER): Payer: Medicare Other | Admitting: Internal Medicine

## 2015-05-30 ENCOUNTER — Other Ambulatory Visit (INDEPENDENT_AMBULATORY_CARE_PROVIDER_SITE_OTHER): Payer: Medicare Other

## 2015-05-30 VITALS — BP 100/60 | HR 96 | Ht 66.0 in | Wt 190.1 lb

## 2015-05-30 DIAGNOSIS — R634 Abnormal weight loss: Secondary | ICD-10-CM | POA: Diagnosis not present

## 2015-05-30 DIAGNOSIS — K219 Gastro-esophageal reflux disease without esophagitis: Secondary | ICD-10-CM

## 2015-05-30 DIAGNOSIS — D649 Anemia, unspecified: Secondary | ICD-10-CM

## 2015-05-30 DIAGNOSIS — R5382 Chronic fatigue, unspecified: Secondary | ICD-10-CM

## 2015-05-30 DIAGNOSIS — R10814 Left lower quadrant abdominal tenderness: Secondary | ICD-10-CM

## 2015-05-30 LAB — CBC WITH DIFFERENTIAL/PLATELET
Basophils Absolute: 0 10*3/uL (ref 0.0–0.1)
Basophils Relative: 0.4 % (ref 0.0–3.0)
Eosinophils Absolute: 0.1 10*3/uL (ref 0.0–0.7)
Eosinophils Relative: 1 % (ref 0.0–5.0)
HCT: 31.4 % — ABNORMAL LOW (ref 39.0–52.0)
HEMOGLOBIN: 10.3 g/dL — AB (ref 13.0–17.0)
Lymphocytes Relative: 13.4 % (ref 12.0–46.0)
Lymphs Abs: 1 10*3/uL (ref 0.7–4.0)
MCHC: 32.8 g/dL (ref 30.0–36.0)
MCV: 74.7 fl — ABNORMAL LOW (ref 78.0–100.0)
MONO ABS: 0.7 10*3/uL (ref 0.1–1.0)
Monocytes Relative: 9.5 % (ref 3.0–12.0)
Neutro Abs: 5.6 10*3/uL (ref 1.4–7.7)
Neutrophils Relative %: 75.7 % (ref 43.0–77.0)
Platelets: 296 10*3/uL (ref 150.0–400.0)
RBC: 4.21 Mil/uL — AB (ref 4.22–5.81)
RDW: 17 % — AB (ref 11.5–15.5)
WBC: 7.4 10*3/uL (ref 4.0–10.5)

## 2015-05-30 LAB — FERRITIN: FERRITIN: 99.8 ng/mL (ref 22.0–322.0)

## 2015-05-30 NOTE — Progress Notes (Signed)
Referred by Dr. [name] Subjective:    Patient ID: James [name], male    DOB: 1946/01/13, 69 y.o.   MRN: [id number] Cc: fatigue, anemia, weight loss, reflux and regurgitation HPI 69 yo wm with frequent heartburn, reflux and some frank regurgitation of foods,. Can cough when he drinks water. Has had reflux  X years. No hx EGD. No abd pain but is tender in LLQ - found by PCP and still there when patient checks. No bowel changes and no rectal bleeding. Was taking ranitidine but stopped because he read it will impair iron absorption.   Finished rad tx of prostate Ca in April. Has had a lot of fatigue also. [facility] labs show anemia Hgb 11.5 June and 05/2015 10. 2 w MCV 78. TIB low at 22 and Fe low at 24 sat 11% and TF low 157. B12 low NL 271.TSH NL.  Dr. Valere Dross does not think fatigue from XRT  Wife thinks fatigue began in spring w/ XRT. Sister also died. He has insomnia. Is still active - tennis and gold but more tired than usual. Anorexia also. ? Some early satiety Wt Readings from Last 3 Encounters:  05/30/15 190 lb 2 oz (86.24 kg)  05/28/15 192 lb (87.091 kg)  02/26/15 197 lb (89.359 kg)   Allergies  Allergen Reactions  . Codeine     Nausea & vomiting   Outpatient Prescriptions Prior to Visit  Medication Sig Dispense Refill  . alum & mag hydroxide-simeth (MAALOX/MYLANTA) 200-200-20 MG/5ML suspension Take 30 mLs by mouth daily.    . fluorouracil (EFUDEX) 5 % cream   0  . IRON PO Take 65 mg by mouth daily.    . Multiple Vitamin (MULTIVITAMIN) capsule Take 1 capsule by mouth daily.    . pravastatin (PRAVACHOL) 40 MG tablet Take 1 tablet (40 mg total) by mouth daily. 90 tablet 3  . ranitidine (ZANTAC) 150 MG tablet Take 1 tablet (150 mg total) by mouth 2 (two) times daily. 60 tablet 2  . VITAMIN D, CHOLECALCIFEROL, PO Take 1,000 mg by mouth.      No facility-administered medications prior to visit.   Past Medical History  Diagnosis Date  . Abnormal EKG     NS ST-T EKG Changes, (-)  Nuclear Stress Test 03/2006  . Hyperlipemia   . Duodenal ulcer 1969    transfusion required, 1 unit packed cells  . Prostate cancer 09/06/14  . Cardiac murmur   . Asthma   . Hx of basal cell carcinoma   . Esophageal reflux   . S/P radiation therapy 11/29/2014 through 01/23/2015     Prostate 7800 cGy in 40 sessions, seminal vesicles 5600 cGy in 40 sessions    Past Surgical History  Procedure Laterality Date  . Trigger finger release Left 1998  . Rotator cuff repair Right     R shoulder, [place]  . Knee arthroscopy Right     Right Knee, [facility]  . Colonoscopy with polypectomy  02/2012    Dr Sharlett Iles  . Prostate biopsy  09/06/14   Social History   Social History  . Marital Status: Married    Spouse Name: N/A  . Number of Children: 2  . Years of Education: N/A   Occupational History  . retired    Social History Main Topics  . Smoking status: Former Smoker -- 0.50 packs/day for 5 years    Types: Cigarettes    Quit date: 10/07/1967  . Smokeless tobacco: Never Used  Comment: smoked 1963- 1969 , up to < 1 ppd  . Alcohol Use: 0.0 oz/week     Comment:  < 3 / week  . Drug Use: No  . Sexual Activity: Not Asked   Other Topics Concern  . None   Social History Narrative   Married and retired - 1 son and 1 daughter    Lives at [place] and active golfer, tennis player   05/30/2015      Family History  Problem Relation Age of Onset  . Diabetes Father   . Heart attack Father 29  . Liver disease Father     Related to Alcohol Use   . Alcohol abuse Mother   . Cirrhosis Mother     died in her 63s  . Multiple myeloma Brother   . Colon cancer Neg Hx   . Stomach cancer Neg Hx   . Stroke Neg Hx   . Diabetes Sister        Review of Systems As per HPI + night sweats, hearing difficulty, allergy/sinus, all other ROS negative    Objective:   Physical Exam @BP  100/60 mmHg  Pulse 96  Ht 5' 6"   (1.676 m)  Wt 190 lb 2 oz (86.24 kg)  BMI 30.70 kg/m2@  General:  Well-developed, well-nourished and in no acute distress Eyes:  anicteric. ENT:   Mouth and posterior pharynx free of lesions.  Neck:   supple w/o thyromegaly or mass.  Lungs: Clear to auscultation bilaterally. Heart:  S1S2, no rubs, murmurs, gallops. Abdomen:  soft,  Mildly tender deep LLQ, better w/ mm tension , no hepatosplenomegaly, hernia, or mass and BS+.  Lymph:  no cervical or supraclavicular adenopathy. Extremities:   no edema, cyanosis or clubbing Skin   no rash. Neuro:  A&O x 3.  Psych:  appropriate mood and  Affect.   Data Reviewed:  2012 Ba swallow w/ images - reflux - no stricture, small HH. [facility] labs XRT notes, PCP notes 2013 colonoscopy - polyps no diverticulosis reported      Assessment & Plan:  Gastroesophageal reflux disease, esophagitis presence not specified - Plan: Ambulatory referral to Gastroenterology  Loss of weight - Plan: Ambulatory referral to Gastroenterology  Chronic fatigue  Chronic anemia - Plan: Methylmalonic acid, serum, CBC w/Diff, Ferritin  LLQ abdominal tenderness  He needs evaluation w/ an EGD. He could be having dysphagia with alternate hx - also ? Possible dysmotility. Neoplqasia UGOI tract possible. The risks and benefits as well as alternatives of endoscopic procedure(s) have been discussed and reviewed. All questions answered. The patient agrees to proceed. Discussed possible esophageal dilation.  May need Ba study again  Labs to reassess anemia - I think this is anemia of chronic dz - iron not helping it. Can be contributing to fatigue. CBC, ferritin and MMA level.  Monitor LLQ tenderness - no tics colon 2013  Consider depressed/ mood d/o given cancer dx and sisters death  ? Remeron  I appreciate the opportunity to care for this patient. MV:HQIONGE Linna Darner, MD

## 2015-05-30 NOTE — Patient Instructions (Signed)
Your physician has requested that you go to the basement for lab work before leaving today  You have been scheduled for an endoscopy. Please follow written instructions given to you at your visit today. If you use inhalers (even only as needed), please bring them with you on the day of your procedure. Your physician has requested that you go to www.[email or web address].com and enter the access code given to you at your visit today. This web site gives a general overview about your procedure. However, you should still follow specific instructions given to you by our office regarding your preparation for the procedure.  I appreciate the opportunity to care for you. Gatha Mayer, MD, [facility]

## 2015-05-31 NOTE — Progress Notes (Signed)
Quick Note:  Hemoglobin stable compared to recent outside labs Ferritin is ok - it measures iron Waiting on other test that is another way to see if B12 is low My Chart note ______

## 2015-06-02 LAB — METHYLMALONIC ACID, SERUM: Methylmalonic Acid, Quant: 348 nmol/L — ABNORMAL HIGH (ref 87–318)

## 2015-06-04 NOTE — Progress Notes (Signed)
Quick Note:  High methylmalonic acid levels suggest B12 deficiency Will discuss at EGD tomorrow  ______

## 2015-06-05 ENCOUNTER — Ambulatory Visit: Payer: Medicare Other | Admitting: Physician Assistant

## 2015-06-05 ENCOUNTER — Ambulatory Visit (AMBULATORY_SURGERY_CENTER): Payer: Medicare Other | Admitting: Internal Medicine

## 2015-06-05 ENCOUNTER — Encounter: Payer: Self-pay | Admitting: Internal Medicine

## 2015-06-05 VITALS — BP 103/68 | HR 78 | Temp 99.3°F | Resp 25 | Ht 66.0 in | Wt 190.0 lb

## 2015-06-05 DIAGNOSIS — K219 Gastro-esophageal reflux disease without esophagitis: Secondary | ICD-10-CM

## 2015-06-05 DIAGNOSIS — K319 Disease of stomach and duodenum, unspecified: Secondary | ICD-10-CM | POA: Diagnosis not present

## 2015-06-05 DIAGNOSIS — E538 Deficiency of other specified B group vitamins: Secondary | ICD-10-CM

## 2015-06-05 DIAGNOSIS — K3189 Other diseases of stomach and duodenum: Secondary | ICD-10-CM

## 2015-06-05 DIAGNOSIS — K317 Polyp of stomach and duodenum: Secondary | ICD-10-CM

## 2015-06-05 HISTORY — DX: Deficiency of other specified B group vitamins: E53.8

## 2015-06-05 MED ORDER — SODIUM CHLORIDE 0.9 % IV SOLN: 500.0000 mL | INTRAVENOUS | Status: DC

## 2015-06-05 NOTE — Progress Notes (Signed)
Called to room to assist during endoscopic procedure.  Patient ID and intended procedure confirmed with present staff. Received instructions for my participation in the procedure from the performing physician.  

## 2015-06-05 NOTE — Progress Notes (Signed)
Transferred to recovery room. A/O x3, pleased with MAC.  VSS.  Report to Wendy, RN. 

## 2015-06-05 NOTE — Op Note (Signed)
[place]  [address]. [place], [address]   ENDOSCOPY PROCEDURE REPORT  PATIENT: James, [name]  MR#: [id number] BIRTHDATE: 1946/06/30 , 31  yrs. old GENDER: male ENDOSCOPIST: Gatha Mayer, MD, [facility] PROCEDURE DATE:  06/05/2015 PROCEDURE:  EGD w/ biopsy ASA CLASS:     Class II INDICATIONS:  reflux, regurgitation, anemia. MEDICATIONS: Propofol 180 mg IV and Monitored anesthesia care TOPICAL ANESTHETIC: none  DESCRIPTION OF PROCEDURE: After the risks benefits and alternatives of the procedure were thoroughly explained, informed consent was obtained.  The LB [id number] endoscope was introduced through the mouth and advanced to the second portion of the duodenum , Without limitations.  The instrument was slowly withdrawn as the mucosa was fully examined.  1) Large firm mass in cardia/fundus - almost seems slightly mobile - biopsies taken.  Looks malignant. 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  Retroflexed views revealed as previously described.     The scope was then withdrawn from the patient and the procedure completed.  COMPLICATIONS: There were no immediate complications.  ENDOSCOPIC IMPRESSION: 1) Large firm mass in cardia/fundus - almost seems slightly mobile - biopsies taken.  Looks malignant. 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  Office will call with results 2.  Will need imaging and surgery eval possibly EUS once I get path back 3.  Start B12 oral and will work on injections, low NL B12 and MMA high   eSigned:  Gatha Mayer, MD, [facility] 06/05/2015 7:56 AM    CC: The Patient, [name], MD

## 2015-06-05 NOTE — Progress Notes (Signed)
Pathology specimens sent [facility].

## 2015-06-05 NOTE — Patient Instructions (Addendum)
I found a mass in the stomach that unfortunately looks like cancer. There was also a small polyp.  Please also start vitamin B12 1000 ug daily   Biopsies were taken.  I should know more tomorrow and will call with next steps.  I appreciate the opportunity to care for you. Gatha Mayer, MD, FACG  YOU HAD AN ENDOSCOPIC PROCEDURE TODAY AT [facility]:   Refer to the procedure report that was given to you for any specific questions about what was found during the examination.  If the procedure report does not answer your questions, please call your gastroenterologist to clarify.  If you requested that your care partner not be given the details of your procedure findings, then the procedure report has been included in a sealed envelope for you to review at your convenience later.  YOU SHOULD EXPECT: Some feelings of bloating in the abdomen. Passage of more gas than usual.  Walking can help get rid of the air that was put into your GI tract during the procedure and reduce the bloating. If you had a lower endoscopy (such as a colonoscopy or flexible sigmoidoscopy) you may notice spotting of blood in your stool or on the toilet paper. If you underwent a bowel prep for your procedure, you may not have a normal bowel movement for a few days.  Please Note:  You might notice some irritation and congestion in your nose or some drainage.  This is from the oxygen used during your procedure.  There is no need for concern and it should clear up in a day or so.  SYMPTOMS TO REPORT IMMEDIATELY:   Following upper endoscopy (EGD)  Vomiting of blood or coffee ground material  New chest pain or pain under the shoulder blades  Painful or persistently difficult swallowing  New shortness of breath  Fever of 100F or higher  Black, tarry-looking stools  For urgent or emergent issues, a gastroenterologist can be reached at any hour by calling [phone].   DIET: Your first meal  following the procedure should be a small meal and then it is ok to progress to your normal diet. Heavy or fried foods are harder to digest and may make you feel nauseous or bloated.  Likewise, meals heavy in dairy and vegetables can increase bloating.  Drink plenty of fluids but you should avoid alcoholic beverages for 24 hours.  ACTIVITY:  You should plan to take it easy for the rest of today and you should NOT DRIVE or use heavy machinery until tomorrow (because of the sedation medicines used during the test).    FOLLOW UP: Our staff will call the number listed on your records the next business day following your procedure to check on you and address any questions or concerns that you may have regarding the information given to you following your procedure. If we do not reach you, we will leave a message.  However, if you are feeling well and you are not experiencing any problems, there is no need to return our call.  We will assume that you have returned to your regular daily activities without incident.  If any biopsies were taken you will be contacted by phone or by letter within the next 1-3 weeks.  Please call us at [phone] if you have not heard about the biopsies in 3 weeks.    SIGNATURES/CONFIDENTIALITY: You and/or your care partner have signed paperwork which will be entered into your electronic medical record.  These signatures attest to the fact that that the information above on your After Visit Summary has been reviewed and is understood.  Full responsibility of the confidentiality of this discharge information lies with you and/or your care-partner.  Await biopsy results

## 2015-06-06 ENCOUNTER — Telehealth: Payer: Self-pay | Admitting: *Deleted

## 2015-06-06 NOTE — Telephone Encounter (Signed)
  Follow up Call-  Call back number 06/05/2015  Post procedure Call Back phone  # [phone]  Permission to leave phone message Yes     Patient questions:  Do you have a fever, pain , or abdominal swelling? No. Pain Score  0 *  Have you tolerated food without any problems? Yes.    Have you been able to return to your normal activities? Yes.    Do you have any questions about your discharge instructions: Diet   No. Medications  No. Follow up visit  No.  Do you have questions or concerns about your Care? No.  Actions: * If pain score is 4 or above: No action needed, pain <4.

## 2015-06-07 ENCOUNTER — Ambulatory Visit (INDEPENDENT_AMBULATORY_CARE_PROVIDER_SITE_OTHER)
Admission: RE | Admit: 2015-06-07 | Discharge: 2015-06-07 | Disposition: A | Discharge status: Home / Self Care | Payer: Medicare Other | Source: Ambulatory Visit | Attending: Internal Medicine | Admitting: Internal Medicine

## 2015-06-07 ENCOUNTER — Ambulatory Visit (INDEPENDENT_AMBULATORY_CARE_PROVIDER_SITE_OTHER): Payer: Medicare Other

## 2015-06-07 ENCOUNTER — Telehealth: Payer: Self-pay | Admitting: Emergency Medicine

## 2015-06-07 ENCOUNTER — Other Ambulatory Visit (INDEPENDENT_AMBULATORY_CARE_PROVIDER_SITE_OTHER): Payer: Medicare Other

## 2015-06-07 ENCOUNTER — Other Ambulatory Visit: Payer: Self-pay

## 2015-06-07 DIAGNOSIS — E538 Deficiency of other specified B group vitamins: Secondary | ICD-10-CM

## 2015-06-07 DIAGNOSIS — C169 Malignant neoplasm of stomach, unspecified: Secondary | ICD-10-CM

## 2015-06-07 LAB — COMPREHENSIVE METABOLIC PANEL
ALT: 6 U/L (ref 0–53)
AST: 9 U/L (ref 0–37)
Albumin: 3.4 g/dL — ABNORMAL LOW (ref 3.5–5.2)
Alkaline Phosphatase: 72 U/L (ref 39–117)
BUN: 13 mg/dL (ref 6–23)
CHLORIDE: 100 meq/L (ref 96–112)
CO2: 29 meq/L (ref 19–32)
Calcium: 9 mg/dL (ref 8.4–10.5)
Creatinine, Ser: 0.77 mg/dL (ref 0.40–1.50)
GFR: 106 mL/min (ref >60.00)
GLUCOSE: 104 mg/dL — AB (ref 70–99)
POTASSIUM: 4.5 meq/L (ref 3.5–5.1)
Sodium: 136 mEq/L (ref 135–145)
Total Bilirubin: 0.3 mg/dL (ref 0.2–1.2)
Total Protein: 7 g/dL (ref 6.0–8.3)

## 2015-06-07 MED ORDER — IOHEXOL 300 MG/ML  SOLN
100.0000 mL | Freq: Once | INTRAMUSCULAR | Status: AC | PRN
  Administered 2015-06-07: 100 mL via INTRAVENOUS

## 2015-06-07 MED ORDER — CYANOCOBALAMIN 1000 MCG/ML IJ SOLN: INTRAMUSCULAR | Status: DC

## 2015-06-07 MED ORDER — CYANOCOBALAMIN 1000 MCG/ML IJ SOLN: 1000.0000 ug | INTRAMUSCULAR | Status: DC

## 2015-06-07 NOTE — Progress Notes (Signed)
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  He needs  1) CT chest, abd, pelvis w/ contrast 2) Ask him what BUN/creat were and date of [place] labs he has - waiting to be scanned - if not acceptable do a CMET 3) Appt Dr. Barry Dienes - FYI he is going to [facility] next week - said he could drive uop for an appt if needed but see if we can get CT's done tomorrow - use anywhere not just [facility] CT if needed - if CT's w/ mets will also need EUS (CCing Dr. Ardis Hughs and Barry Dienes also) 4) Needs B12 1000 ug injection monthly - ? W/ Dr. Linna Darner or if we have any at least give him one O cced Dr. Linna Darner ______

## 2015-06-08 ENCOUNTER — Other Ambulatory Visit: Payer: Self-pay

## 2015-06-08 ENCOUNTER — Telehealth: Payer: Self-pay | Admitting: Oncology

## 2015-06-08 ENCOUNTER — Ambulatory Visit (HOSPITAL_BASED_OUTPATIENT_CLINIC_OR_DEPARTMENT_OTHER): Payer: Medicare Other | Admitting: Oncology

## 2015-06-08 ENCOUNTER — Ambulatory Visit: Payer: Medicare Other | Attending: Internal Medicine | Admitting: Physical Therapy

## 2015-06-08 ENCOUNTER — Ambulatory Visit: Payer: Medicare Other | Admitting: Nutrition

## 2015-06-08 ENCOUNTER — Telehealth: Payer: Self-pay | Admitting: *Deleted

## 2015-06-08 ENCOUNTER — Encounter: Payer: Self-pay | Admitting: Oncology

## 2015-06-08 ENCOUNTER — Other Ambulatory Visit: Payer: Self-pay | Admitting: General Surgery

## 2015-06-08 ENCOUNTER — Encounter: Payer: Self-pay | Admitting: *Deleted

## 2015-06-08 VITALS — BP 103/67 | HR 89 | Temp 101.1°F | Resp 18 | Ht 66.0 in | Wt 189.7 lb

## 2015-06-08 DIAGNOSIS — C169 Malignant neoplasm of stomach, unspecified: Secondary | ICD-10-CM

## 2015-06-08 DIAGNOSIS — D509 Iron deficiency anemia, unspecified: Secondary | ICD-10-CM

## 2015-06-08 DIAGNOSIS — Z85028 Personal history of other malignant neoplasm of stomach: Secondary | ICD-10-CM | POA: Insufficient documentation

## 2015-06-08 DIAGNOSIS — C61 Malignant neoplasm of prostate: Secondary | ICD-10-CM

## 2015-06-08 DIAGNOSIS — E538 Deficiency of other specified B group vitamins: Secondary | ICD-10-CM | POA: Diagnosis not present

## 2015-06-08 MED ORDER — CYANOCOBALAMIN 1000 MCG/ML IJ SOLN
1000.0000 ug | Freq: Once | INTRAMUSCULAR | Status: AC
  Administered 2015-06-07: 1000 ug via INTRAMUSCULAR

## 2015-06-08 NOTE — Telephone Encounter (Signed)
Called and left a message with 10/14a appointments

## 2015-06-08 NOTE — Progress Notes (Signed)
Patient was seen in GI clinic.  69 year old male diagnosed with a gastric mass.  Past medical history includes hyperlipidemia, duodenal ulcer, prostate cancer, esophageal reflux, radiation therapy and B12 deficiency.  Medications include Maalox, vitamin B12, multivitamin, Zantac, vitamin D.  Labs include glucose 104, and albumin 3.4 on September 1.  Height: 66 inches. Weight: 190 pounds. Usual body weight: 206 pounds March 2016. BMI: 30.68.  Met with patient and wife, during GI clinic Patient reports he is eating well and has no nutrition impact symptoms. Patient reports plan is for surgery in chemotherapy.  Nutrition diagnosis: Food and nutrition related knowledge deficit related to new diagnosis of gastric cancer as evidenced by no prior need for nutrition related information.  Intervention: Patient and wife were educated on general, healthy nutrition with increased protein intake to promote healing after surgery. Encouraged weight maintenance throughout treatment. Provided fact sheet on protein containing foods. Questions were answered and teach back method used.  Monitoring, evaluation, goals: Patient will tolerate healthy diet after surgery to promote healing.  Next visit: I will follow-up with patient during chemotherapy once this is scheduled.  **Disclaimer: This note was dictated with voice recognition software. Similar sounding words can inadvertently be transcribed and this note may contain transcription errors which may not have been corrected upon publication of note.**

## 2015-06-08 NOTE — Progress Notes (Signed)
James [name]   Referring MD: Osric Klopf 69 y.o.  [birth year]    Reason for Referral: Gastric cancer   HPI: He completed external beam radiation for treatment of prostate cancer in the spring of this year. After initial improvement and malaise he developed progressive fatigue for the past 2 months with dyspnea on exertion. He was found to have microcytic anemia on a physical at the [facility]. He described heartburn, reflux, and regurgitation of food. He was referred to Dr. Carlean Purl and was taken to an upper endoscopy on 06/05/2015. A large firm mass was noted at the gastric cardia/fundus was biopsied. A 1 cm sessile distal gastric body polyp was biopsied. 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 The gastric body biopsy returned as a tubular adenoma.  He has been referred to Dr. Ardis Hughs for an endoscopic ultrasound. He was recently diagnosed a vitamin B 12 deficiency and started on vitamin B-12 replacement.  Staging CTs of the chest, abdomen, and pelvis on 06/07/2015 revealed no pathologically enlarged mediastinal or hilar lymph nodes. The esophagus appeared unremarkable. Small pulmonary nodules are nonspecific. No large lesions to suggest metastatic disease. A 1.6 cm cyst was noted in segment 7 of the liver. Several too small to characterize lesions were noted in the liver. The adrenal glands appeared normal. A pedunculated soft tissue mass was noted adjacent to the GE junction extending into the lumen of the stomach. No lymphadenopathy in the abdomen or pelvis. No ascites.    Past Medical History  Diagnosis Date  . Abnormal EKG     NS ST-T EKG Changes, (-) Nuclear Stress Test 03/2006  . Hyperlipemia   . Duodenal ulcer 1969    transfusion required, 1 unit packed cells  . Prostate cancer-T1 c, external beam radiation completed 01/23/2015  09/06/14  . Cardiac murmur   . Asthma    . Hx of basal cell carcinoma   . Esophageal reflux   . S/P radiation therapy 11/29/2014 through 01/23/2015     Prostate 7800 cGy in 40 sessions, seminal vesicles 5600 cGy in 40 sessions   . B12 deficiency 06/05/2015    B12 Low NL at 271 methtylmalonic acid high    Past Surgical History  Procedure Laterality Date  . Trigger finger release Left 1998  . Rotator cuff repair Right     R shoulder, [place]  . Knee arthroscopy Right     Right Knee, [facility]  . Colonoscopy with polypectomy  02/2012    Dr Sharlett Iles  . Prostate biopsy  09/06/14    Medications: Reviewed  Allergies:  Allergies  Allergen Reactions  . Codeine     Nausea & vomiting    Family history: His brother had multiple myeloma. No other family history of cancer.  Social History:   He is retired from a business occupation. He lives in [place]. He quit smoking cigarettes in 1968. He has approximately one alcohol drink per day. He was transfused with a bleeding ulcer in the 1960s.  History  Alcohol Use  . 0.0 oz/week    Comment:  < 3 / week    History  Smoking status  . Former Smoker -- 0.50 packs/day for 5 years  . Types: Cigarettes  . Quit date: 10/07/1967  Smokeless tobacco  . Never Used    Comment: smoked [place] , up to < 1 ppd      ROS:   Positives  include:  A complete ROS was otherwise negative.  Physical Exam:  Blood pressure 103/67, pulse 89, temperature 101.1 F (38.4 C), temperature source Oral, resp. rate 18, height 5' 6"  (1.676 m), weight 189 lb 11.2 oz (86.047 kg), SpO2 97 %. (he reports drinking hot coffee just prior to the temperature check today-no fever at home)  HEENT: Oropharynx without visible mass, neck without mass Lungs: Clear bilaterally Cardiac: Regular rate and rhythm Abdomen: No hepatosplenomegaly, nontender, no mass GU: Testes without mass  Vascular: No leg edema Lymph nodes: No  cervical, supra-clavicular, axillary, or inguinal nodes Neurologic: Alert and oriented, the motor exam appears intact in the upper and lower extremities Skin: Multiple benign appearing moles over the trunk Musculoskeletal: No spine tenderness   LAB:  CBC  Lab Results  Component Value Date   WBC 7.4 05/30/2015   HGB 10.3* 05/30/2015   HCT 31.4* 05/30/2015   MCV 74.7* 05/30/2015   PLT 296.0 05/30/2015   NEUTROABS 5.6 05/30/2015     CMP      Component Value Date/Time   NA 136 06/07/2015 1406   K 4.5 06/07/2015 1406   CL 100 06/07/2015 1406   CO2 29 06/07/2015 1406   GLUCOSE 104* 06/07/2015 1406   BUN 13 06/07/2015 1406   CREATININE 0.77 06/07/2015 1406   CALCIUM 9.0 06/07/2015 1406   PROT 7.0 06/07/2015 1406   ALBUMIN 3.4* 06/07/2015 1406   AST 9 06/07/2015 1406   ALT 6 06/07/2015 1406   ALKPHOS 72 06/07/2015 1406   BILITOT 0.3 06/07/2015 1406   GFRNONAA 103.10 12/21/2009 0000   GFRAA 126 11/27/2008 0821     Imaging:  Ct Chest W Contrast  06/07/2015   CLINICAL DATA:  69 year old male with abdominal pain since January 2016. Remote history of duodenal ulcer. Newly diagnosed gastric tumor on endoscopy performed 2 days ago. Evaluate for metastatic disease. Recent history of prostate cancer status post radiation therapy, completed in April 2016.  EXAM: CT CHEST, ABDOMEN, AND PELVIS WITH CONTRAST  TECHNIQUE: Multidetector CT imaging of the chest, abdomen and pelvis was performed following the standard protocol during bolus administration of intravenous contrast.  CONTRAST:  190m OMNIPAQUE IOHEXOL 300 MG/ML  SOLN  COMPARISON:  No priors.  FINDINGS: CT CHEST FINDINGS  Mediastinum/Lymph Nodes: Heart size is normal. There is no significant pericardial fluid, thickening or pericardial calcification. There is atherosclerosis of the thoracic aorta, the great vessels of the mediastinum and the coronary arteries, including calcified atherosclerotic plaque in the left anterior descending,  left circumflex and right coronary arteries. Severe calcifications of the mitral annulus. No pathologically enlarged mediastinal or hilar lymph nodes. Esophagus is unremarkable in appearance. No axillary lymphadenopathy.  Lungs/Pleura: 3 mm subpleural nodule along the left major fissure (image 24 of series 3). A few other scattered 1-2 mm pulmonary nodules in the lungs are highly nonspecific. No larger more suspicious lesions to strongly suggest metastatic disease at this time. No acute consolidative airspace disease. No pleural effusions.  Musculoskeletal/Soft Tissues: There are no aggressive appearing lytic or blastic lesions noted in the visualized portions of the skeleton.  CT ABDOMEN AND PELVIS FINDINGS  Hepatobiliary: 1.6 cm well-defined low-attenuation lesion in segment 7 of the liver is compatible with a small simple cyst. Several other tiny sub cm low-attenuation lesions in the liver are too small to definitively characterize. No intra or extrahepatic biliary ductal dilatation. Gallbladder is normal in appearance.  Pancreas: No pancreatic mass. No pancreatic ductal dilatation. No pancreatic or peripancreatic fluid or inflammatory  changes.  Spleen: Unremarkable.  Adrenals/Urinary Tract: Bilateral adrenal glands are normal in appearance. 2.6 cm well-defined low-attenuation lesion in the lateral interpolar left kidney compatible with a simple cyst. Multiple smaller sub cm low-attenuation lesions in the kidneys bilaterally are too small to characterize, but favored to represent tiny cysts. No hydroureteronephrosis. Urinary bladder is normal in appearance.  Stomach/Bowel: Pedunculated 6.4 x 6.3 x 6.2 cm soft tissue mass which appears to arise immediately adjacent to the gastroesophageal junction, and extends into the lumen of the stomach. Mild thickening of the lesser curvature of the stomach adjacent to the mass. No pathologic dilatation of small bowel or colon. Numerous colonic diverticulae are noted,  particularly in the sigmoid colon, without surrounding inflammatory changes to suggest an acute diverticulitis at this time. Normal appendix.  Vascular/Lymphatic: Atherosclerosis throughout the abdominal and pelvic vasculature, without evidence of aneurysm. No definite lymphadenopathy noted in the abdomen or pelvis.  Reproductive: Fiducial markers in the prostate gland. Prostate gland and seminal vesicles are grossly unremarkable in appearance.  Other: No significant volume of ascites.  No pneumoperitoneum.  Musculoskeletal: There are no aggressive appearing lytic or blastic lesions noted in the visualized portions of the skeleton.  IMPRESSION: 1. 6.4 x 6.3 x 6.2 cm gastric neoplasm which appears to arise immediately distal to the gastroesophageal junction. No definite signs of metastatic disease in the chest, abdomen or pelvis on today's examination. 2. 1.6 cm well-defined low-attenuation lesion in segment 7 of the liver is compatible with a simple cyst. There are several tiny sub cm low-attenuation lesions which are too small to definitively characterize the appearance the small lesions suggests tiny cysts, but attention on follow-up studies to ensure stability is recommended. 3. Atherosclerosis, including 3 vessel coronary artery disease. Please note that although the presence of coronary artery calcium documents the presence of coronary artery disease, the severity of this disease and any potential stenosis cannot be assessed on this non-gated CT examination. Assessment for potential risk factor modification, dietary therapy or pharmacologic therapy may be warranted, if clinically indicated. 4. There are calcifications of the mitral annulus. Echocardiographic correlation for evaluation of potential valvular dysfunction may be warranted if clinically indicated. 5. A few scattered tiny pulmonary nodules measuring 3 mm or less in size are highly nonspecific, and favored to be benign, but attention on followup  studies is recommended.   Electronically Signed   By: Vinnie Langton M.D.   On: 06/07/2015 17:27   Ct Abdomen Pelvis W Contrast  06/07/2015   CLINICAL DATA:  69 year old male with abdominal pain since January 2016. Remote history of duodenal ulcer. Newly diagnosed gastric tumor on endoscopy performed 2 days ago. Evaluate for metastatic disease. Recent history of prostate cancer status post radiation therapy, completed in April 2016.  EXAM: CT CHEST, ABDOMEN, AND PELVIS WITH CONTRAST  TECHNIQUE: Multidetector CT imaging of the chest, abdomen and pelvis was performed following the standard protocol during bolus administration of intravenous contrast.  CONTRAST:  129m OMNIPAQUE IOHEXOL 300 MG/ML  SOLN  COMPARISON:  No priors.  FINDINGS: CT CHEST FINDINGS  Mediastinum/Lymph Nodes: Heart size is normal. There is no significant pericardial fluid, thickening or pericardial calcification. There is atherosclerosis of the thoracic aorta, the great vessels of the mediastinum and the coronary arteries, including calcified atherosclerotic plaque in the left anterior descending, left circumflex and right coronary arteries. Severe calcifications of the mitral annulus. No pathologically enlarged mediastinal or hilar lymph nodes. Esophagus is unremarkable in appearance. No axillary lymphadenopathy.  Lungs/Pleura: 3 mm subpleural  nodule along the left major fissure (image 24 of series 3). A few other scattered 1-2 mm pulmonary nodules in the lungs are highly nonspecific. No larger more suspicious lesions to strongly suggest metastatic disease at this time. No acute consolidative airspace disease. No pleural effusions.  Musculoskeletal/Soft Tissues: There are no aggressive appearing lytic or blastic lesions noted in the visualized portions of the skeleton.  CT ABDOMEN AND PELVIS FINDINGS  Hepatobiliary: 1.6 cm well-defined low-attenuation lesion in segment 7 of the liver is compatible with a small simple cyst. Several other tiny  sub cm low-attenuation lesions in the liver are too small to definitively characterize. No intra or extrahepatic biliary ductal dilatation. Gallbladder is normal in appearance.  Pancreas: No pancreatic mass. No pancreatic ductal dilatation. No pancreatic or peripancreatic fluid or inflammatory changes.  Spleen: Unremarkable.  Adrenals/Urinary Tract: Bilateral adrenal glands are normal in appearance. 2.6 cm well-defined low-attenuation lesion in the lateral interpolar left kidney compatible with a simple cyst. Multiple smaller sub cm low-attenuation lesions in the kidneys bilaterally are too small to characterize, but favored to represent tiny cysts. No hydroureteronephrosis. Urinary bladder is normal in appearance.  Stomach/Bowel: Pedunculated 6.4 x 6.3 x 6.2 cm soft tissue mass which appears to arise immediately adjacent to the gastroesophageal junction, and extends into the lumen of the stomach. Mild thickening of the lesser curvature of the stomach adjacent to the mass. No pathologic dilatation of small bowel or colon. Numerous colonic diverticulae are noted, particularly in the sigmoid colon, without surrounding inflammatory changes to suggest an acute diverticulitis at this time. Normal appendix.  Vascular/Lymphatic: Atherosclerosis throughout the abdominal and pelvic vasculature, without evidence of aneurysm. No definite lymphadenopathy noted in the abdomen or pelvis.  Reproductive: Fiducial markers in the prostate gland. Prostate gland and seminal vesicles are grossly unremarkable in appearance.  Other: No significant volume of ascites.  No pneumoperitoneum.  Musculoskeletal: There are no aggressive appearing lytic or blastic lesions noted in the visualized portions of the skeleton.  IMPRESSION: 1. 6.4 x 6.3 x 6.2 cm gastric neoplasm which appears to arise immediately distal to the gastroesophageal junction. No definite signs of metastatic disease in the chest, abdomen or pelvis on today's examination. 2. 1.6  cm well-defined low-attenuation lesion in segment 7 of the liver is compatible with a simple cyst. There are several tiny sub cm low-attenuation lesions which are too small to definitively characterize the appearance the small lesions suggests tiny cysts, but attention on follow-up studies to ensure stability is recommended. 3. Atherosclerosis, including 3 vessel coronary artery disease. Please note that although the presence of coronary artery calcium documents the presence of coronary artery disease, the severity of this disease and any potential stenosis cannot be assessed on this non-gated CT examination. Assessment for potential risk factor modification, dietary therapy or pharmacologic therapy may be warranted, if clinically indicated. 4. There are calcifications of the mitral annulus. Echocardiographic correlation for evaluation of potential valvular dysfunction may be warranted if clinically indicated. 5. A few scattered tiny pulmonary nodules measuring 3 mm or less in size are highly nonspecific, and favored to be benign, but attention on followup studies is recommended.   Electronically Signed   By: Vinnie Langton M.D.   On: 06/07/2015 17:27      Assessment/Plan:   1. Gastric cancer-adenocarcinoma of the gastric fundus/cardia  Staging CT scans 06/07/2015 with no evidence of distant metastatic disease, tiny nonspecific pulmonary nodules and liver lesions-likely benign 2. Microcytic anemia-likely iron deficiency anemia secondary to #1  3.  Vitamin B-12 deficiency -now on vitamin B-12 replacement  4.   T1c prostate cancer-status post external beam radiation completed April 2016   Disposition:   James [name] has been diagnosed with adenocarcinoma of the stomach. There is no clinical or CT scan evidence of distant metastatic disease. He appears to be a candidate for surgical resection.  He was seen by myself and Dr. Barry Dienes in the multidisciplinary GI oncology clinic today. Dr. Barry Dienes  recommends a staging EUS. She feels the lesion is resectable with a partial gastrectomy if he is confirmed to have early-stage disease on the EUS.  I discussed alternate treatment options for early stage gastric cancer with James [name] and his wife. We reviewed neoadjuvant and adjuvant chemotherapy. We also discussed the role for adjuvant radiation.  The plan is to proceed with primary resection by Dr. Barry Dienes if he is not confirmed to have more advanced disease at the EUS or with surgical expiration.  James [name] will begin ferrous sulfate for the apparent iron deficiency anemia. He will return for an office visit after surgery. We will arrange for an appointment with Dr. Valere Dross after surgery to consider the indication for adjuvant radiation.  He was evaluating by the Cancer center nutritionist and physical therapist today.  Approximately 50 minutes were spent with the patient today. The majority of the time was used for counseling and coordination of care.  [place], [place] 06/08/2015, 5:38 PM

## 2015-06-08 NOTE — Telephone Encounter (Signed)
Requested that James [name] recheck his temp at home today and call if it is not going down. He thinks it was up due to drinking hot coffee.

## 2015-06-08 NOTE — Progress Notes (Signed)
Quick Note:  James [name] he is in oncology appts all day today ______

## 2015-06-08 NOTE — Progress Notes (Signed)
[place] Psychosocial Distress Screening Clinical Social Work  Clinical Social Work met with pt and his wife, Tye [place] at [facility] to introduce self and review by distress screening protocol.  The patient scored a 5 on the Psychosocial Distress Thermometer which indicates moderate distress. Clinical Social Worker reviewed support programs, such as GI Group and explained resources of Pt and Family Support Team. Pt reports he is looking forward to a vacation next week prior to starting treatment. He denied other concerns and reported a strong support network to assist him. CSW provided contact information and other resource information. Pt reports his pain was addressed by MD and he also met with dietician to review appetite concerns. He agrees to reach out as needed.    ONCBCN DISTRESS SCREENING 06/08/2015  Screening Type Initial Screening  Distress experienced in past week (1-10) 5  Information Concerns Type   Physical Problem type Pain;Loss of appetitie  Physician notified of physical symptoms Yes  Referral to clinical social work Yes  Referral to dietition Yes     Clinical Social Worker follow up needed: No.  If yes, follow up plan:  Loren Racer, [place]  [facility] Phone: [phone] Fax: [phone]

## 2015-06-08 NOTE — Progress Notes (Signed)
Quick Note:  Seeing Dr. Benay Spice today so will let him explain results. Suspect he will need an EUS also - will defer to Drs. Barry Dienes and Ardis Hughs I had not mentioned EUS to patient yet. ______

## 2015-06-08 NOTE — Progress Notes (Signed)
06-08-15 1230 Pt desires to reschedule procedure, he is to contact your office for this.Note in Epic and office called.

## 2015-06-08 NOTE — Progress Notes (Signed)
Oncology Nurse Navigator Documentation  Oncology Nurse Navigator Flowsheets 06/08/2015  Referral date to RadOnc/MedOnc 06/07/2015  Navigator Encounter Type Clinic/MDC  Patient Visit Type Medonc;Surgery  Treatment Phase Treatment  Barriers/Navigation Needs Education  Education Understanding Cancer/ Treatment Options;Newly Diagnosed Cancer Education;Preparing for Upcoming Surgery/ Treatment  Interventions Education Method  Education Method Teach-back;Verbal;Written  Support Groups/Services GI  Time Spent with Patient 30  Met with patient and wife, Cathy during new patient visit in GI MDC. Explained the role of the GI Nurse Navigator and provided New Patient Packet with information on: 1. Gastric cancer 2. Support groups 3. Fall Safety Plan Answered questions, reviewed current treatment plan using TEACH back and provided emotional support. Provided copy of current treatment plan. Explained EUS procedure and why it is important. Also discussed rationale for a genetics referral in future per Dr. Byerly request. Reviewed starting FeSo4 325 mg twice daily and potential for constipation. Also met today with CSW and dietician.  Susan Coward, RN, BSN GI Oncology Navigator [facility]  

## 2015-06-13 ENCOUNTER — Encounter (HOSPITAL_COMMUNITY): Payer: Self-pay | Admitting: *Deleted

## 2015-06-13 DIAGNOSIS — E538 Deficiency of other specified B group vitamins: Secondary | ICD-10-CM | POA: Diagnosis not present

## 2015-06-13 NOTE — Progress Notes (Signed)
06-13-15 1600 Finally a return call back from pt to confirm he is coming for procedure 06-14-15, History done and instructions given.

## 2015-06-14 ENCOUNTER — Ambulatory Visit (HOSPITAL_COMMUNITY): Payer: Medicare Other | Admitting: Anesthesiology

## 2015-06-14 ENCOUNTER — Encounter (HOSPITAL_COMMUNITY)
Admission: RE | Disposition: A | Discharge status: Home / Self Care | Payer: Self-pay | Source: Ambulatory Visit | Attending: Gastroenterology

## 2015-06-14 ENCOUNTER — Encounter (HOSPITAL_COMMUNITY): Payer: Self-pay | Admitting: *Deleted

## 2015-06-14 ENCOUNTER — Ambulatory Visit (HOSPITAL_COMMUNITY)
Admission: RE | Admit: 2015-06-14 | Discharge: 2015-06-14 | Disposition: A | Discharge status: Home / Self Care | Payer: Medicare Other | Source: Ambulatory Visit | Attending: Gastroenterology | Admitting: Gastroenterology

## 2015-06-14 DIAGNOSIS — Z85828 Personal history of other malignant neoplasm of skin: Secondary | ICD-10-CM | POA: Diagnosis not present

## 2015-06-14 DIAGNOSIS — K7689 Other specified diseases of liver: Secondary | ICD-10-CM | POA: Diagnosis not present

## 2015-06-14 DIAGNOSIS — E785 Hyperlipidemia, unspecified: Secondary | ICD-10-CM | POA: Diagnosis not present

## 2015-06-14 DIAGNOSIS — E538 Deficiency of other specified B group vitamins: Secondary | ICD-10-CM | POA: Diagnosis not present

## 2015-06-14 DIAGNOSIS — C169 Malignant neoplasm of stomach, unspecified: Secondary | ICD-10-CM | POA: Diagnosis not present

## 2015-06-14 DIAGNOSIS — Z87891 Personal history of nicotine dependence: Secondary | ICD-10-CM | POA: Insufficient documentation

## 2015-06-14 DIAGNOSIS — Z923 Personal history of irradiation: Secondary | ICD-10-CM | POA: Insufficient documentation

## 2015-06-14 DIAGNOSIS — D509 Iron deficiency anemia, unspecified: Secondary | ICD-10-CM | POA: Diagnosis not present

## 2015-06-14 DIAGNOSIS — J45909 Unspecified asthma, uncomplicated: Secondary | ICD-10-CM | POA: Diagnosis not present

## 2015-06-14 DIAGNOSIS — R918 Other nonspecific abnormal finding of lung field: Secondary | ICD-10-CM | POA: Insufficient documentation

## 2015-06-14 DIAGNOSIS — K219 Gastro-esophageal reflux disease without esophagitis: Secondary | ICD-10-CM | POA: Insufficient documentation

## 2015-06-14 DIAGNOSIS — Z8546 Personal history of malignant neoplasm of prostate: Secondary | ICD-10-CM | POA: Diagnosis not present

## 2015-06-14 DIAGNOSIS — C160 Malignant neoplasm of cardia: Secondary | ICD-10-CM | POA: Diagnosis not present

## 2015-06-14 HISTORY — PX: EUS: SHX5427

## 2015-06-14 HISTORY — DX: Anemia, unspecified: D64.9

## 2015-06-14 HISTORY — DX: Personal history of other medical treatment: Z92.89

## 2015-06-14 SURGERY — ESOPHAGEAL ENDOSCOPIC ULTRASOUND (EUS) RADIAL
Anesthesia: Monitor Anesthesia Care

## 2015-06-14 MED ORDER — SODIUM CHLORIDE 0.9 % IV SOLN: INTRAVENOUS | Status: DC

## 2015-06-14 MED ORDER — PROPOFOL 10 MG/ML IV BOLUS
INTRAVENOUS | Status: AC
  Filled 2015-06-14: qty 20

## 2015-06-14 MED ORDER — LACTATED RINGERS IV SOLN
INTRAVENOUS | Status: DC
  Administered 2015-06-14: 1000 mL via INTRAVENOUS

## 2015-06-14 MED ORDER — PROPOFOL 10 MG/ML IV BOLUS
INTRAVENOUS | Status: DC | PRN
  Administered 2015-06-14 (×3): 20 mg via INTRAVENOUS

## 2015-06-14 MED ORDER — PROPOFOL INFUSION 10 MG/ML OPTIME
INTRAVENOUS | Status: DC | PRN
  Administered 2015-06-14: 150 ug/kg/min via INTRAVENOUS

## 2015-06-14 NOTE — H&P (View-Only) (Signed)
[place] New Patient Consult   Referring MD: [place] Vandergriff 69 y.o.  1946-03-24    Reason for Referral: Gastric cancer   HPI: He completed external beam radiation for treatment of prostate cancer in the spring of this year. After initial improvement and malaise he developed progressive fatigue for the past 2 months with dyspnea on exertion. He was found to have microcytic anemia on a physical at the [facility]. He described heartburn, reflux, and regurgitation of food. He was referred to Dr. Carlean Purl and was taken to an upper endoscopy on 06/05/2015. A large firm mass was noted at the gastric cardia/fundus was biopsied. A 1 cm sessile distal gastric body polyp was biopsied. 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 The gastric body biopsy returned as a tubular adenoma.  He has been referred to Dr. Ardis Hughs for an endoscopic ultrasound. He was recently diagnosed a vitamin B 12 deficiency and started on vitamin B-12 replacement.  Staging CTs of the chest, abdomen, and pelvis on 06/07/2015 revealed no pathologically enlarged mediastinal or hilar lymph nodes. The esophagus appeared unremarkable. Small pulmonary nodules are nonspecific. No large lesions to suggest metastatic disease. A 1.6 cm cyst was noted in segment 7 of the liver. Several too small to characterize lesions were noted in the liver. The adrenal glands appeared normal. A pedunculated soft tissue mass was noted adjacent to the GE junction extending into the lumen of the stomach. No lymphadenopathy in the abdomen or pelvis. No ascites.    Past Medical History  Diagnosis Date  . Abnormal EKG     NS ST-T EKG Changes, (-) Nuclear Stress Test 03/2006  . Hyperlipemia   . Duodenal ulcer 1969    transfusion required, 1 unit packed cells  . Prostate cancer-T1 c, external beam radiation completed 01/23/2015  09/06/14  . Cardiac murmur   . Asthma    . Hx of basal cell carcinoma   . Esophageal reflux   . S/P radiation therapy 11/29/2014 through 01/23/2015     Prostate 7800 cGy in 40 sessions, seminal vesicles 5600 cGy in 40 sessions   . B12 deficiency 06/05/2015    B12 Low NL at 271 methtylmalonic acid high    Past Surgical History  Procedure Laterality Date  . Trigger finger release Left 1998  . Rotator cuff repair Right     R shoulder, [place]  . Knee arthroscopy Right     Right Knee, [place] [facility]  . Colonoscopy with polypectomy  02/2012    Dr Sharlett Iles  . Prostate biopsy  09/06/14    Medications: Reviewed  Allergies:  Allergies  Allergen Reactions  . Codeine     Nausea & vomiting    Family history: His brother had multiple myeloma. No other family history of cancer.  Social History:   He is retired from a business occupation. He lives in [place]. He quit smoking cigarettes in 1968. He has approximately one alcohol drink per day. He was transfused with a bleeding ulcer in the 1960s.  History  Alcohol Use  . 0.0 oz/week    Comment:  < 3 / week    History  Smoking status  . Former Smoker -- 0.50 packs/day for 5 years  . Types: Cigarettes  . Quit date: 10/07/1967  Smokeless tobacco  . Never Used    Comment: smoked [place] , up to < 1 ppd      ROS:   Positives  include:  A complete ROS was otherwise negative.  Physical Exam:  Blood pressure 103/67, pulse 89, temperature 101.1 F (38.4 C), temperature source Oral, resp. rate 18, height 5' 6"  (1.676 m), weight 189 lb 11.2 oz (86.047 kg), SpO2 97 %. (he reports drinking hot coffee just prior to the temperature check today-no fever at home)  HEENT: Oropharynx without visible mass, neck without mass Lungs: Clear bilaterally Cardiac: Regular rate and rhythm Abdomen: No hepatosplenomegaly, nontender, no mass GU: Testes without mass  Vascular: No leg edema Lymph nodes: No  cervical, supra-clavicular, axillary, or inguinal nodes Neurologic: Alert and oriented, the motor exam appears intact in the upper and lower extremities Skin: Multiple benign appearing moles over the trunk Musculoskeletal: No spine tenderness   LAB:  CBC  Lab Results  Component Value Date   WBC 7.4 05/30/2015   HGB 10.3* 05/30/2015   HCT 31.4* 05/30/2015   MCV 74.7* 05/30/2015   PLT 296.0 05/30/2015   NEUTROABS 5.6 05/30/2015     CMP      Component Value Date/Time   NA 136 06/07/2015 1406   K 4.5 06/07/2015 1406   CL 100 06/07/2015 1406   CO2 29 06/07/2015 1406   GLUCOSE 104* 06/07/2015 1406   BUN 13 06/07/2015 1406   CREATININE 0.77 06/07/2015 1406   CALCIUM 9.0 06/07/2015 1406   PROT 7.0 06/07/2015 1406   ALBUMIN 3.4* 06/07/2015 1406   AST 9 06/07/2015 1406   ALT 6 06/07/2015 1406   ALKPHOS 72 06/07/2015 1406   BILITOT 0.3 06/07/2015 1406   GFRNONAA 103.10 12/21/2009 0000   GFRAA 126 11/27/2008 0821     Imaging:  Ct Chest W Contrast  06/07/2015   CLINICAL DATA:  69 year old male with abdominal pain since January 2016. Remote history of duodenal ulcer. Newly diagnosed gastric tumor on endoscopy performed 2 days ago. Evaluate for metastatic disease. Recent history of prostate cancer status post radiation therapy, completed in April 2016.  EXAM: CT CHEST, ABDOMEN, AND PELVIS WITH CONTRAST  TECHNIQUE: Multidetector CT imaging of the chest, abdomen and pelvis was performed following the standard protocol during bolus administration of intravenous contrast.  CONTRAST:  171m OMNIPAQUE IOHEXOL 300 MG/ML  SOLN  COMPARISON:  No priors.  FINDINGS: CT CHEST FINDINGS  Mediastinum/Lymph Nodes: Heart size is normal. There is no significant pericardial fluid, thickening or pericardial calcification. There is atherosclerosis of the thoracic aorta, the great vessels of the mediastinum and the coronary arteries, including calcified atherosclerotic plaque in the left anterior descending,  left circumflex and right coronary arteries. Severe calcifications of the mitral annulus. No pathologically enlarged mediastinal or hilar lymph nodes. Esophagus is unremarkable in appearance. No axillary lymphadenopathy.  Lungs/Pleura: 3 mm subpleural nodule along the left major fissure (image 24 of series 3). A few other scattered 1-2 mm pulmonary nodules in the lungs are highly nonspecific. No larger more suspicious lesions to strongly suggest metastatic disease at this time. No acute consolidative airspace disease. No pleural effusions.  Musculoskeletal/Soft Tissues: There are no aggressive appearing lytic or blastic lesions noted in the visualized portions of the skeleton.  CT ABDOMEN AND PELVIS FINDINGS  Hepatobiliary: 1.6 cm well-defined low-attenuation lesion in segment 7 of the liver is compatible with a small simple cyst. Several other tiny sub cm low-attenuation lesions in the liver are too small to definitively characterize. No intra or extrahepatic biliary ductal dilatation. Gallbladder is normal in appearance.  Pancreas: No pancreatic mass. No pancreatic ductal dilatation. No pancreatic or peripancreatic fluid or inflammatory  changes.  Spleen: Unremarkable.  Adrenals/Urinary Tract: Bilateral adrenal glands are normal in appearance. 2.6 cm well-defined low-attenuation lesion in the lateral interpolar left kidney compatible with a simple cyst. Multiple smaller sub cm low-attenuation lesions in the kidneys bilaterally are too small to characterize, but favored to represent tiny cysts. No hydroureteronephrosis. Urinary bladder is normal in appearance.  Stomach/Bowel: Pedunculated 6.4 x 6.3 x 6.2 cm soft tissue mass which appears to arise immediately adjacent to the gastroesophageal junction, and extends into the lumen of the stomach. Mild thickening of the lesser curvature of the stomach adjacent to the mass. No pathologic dilatation of small bowel or colon. Numerous colonic diverticulae are noted,  particularly in the sigmoid colon, without surrounding inflammatory changes to suggest an acute diverticulitis at this time. Normal appendix.  Vascular/Lymphatic: Atherosclerosis throughout the abdominal and pelvic vasculature, without evidence of aneurysm. No definite lymphadenopathy noted in the abdomen or pelvis.  Reproductive: Fiducial markers in the prostate gland. Prostate gland and seminal vesicles are grossly unremarkable in appearance.  Other: No significant volume of ascites.  No pneumoperitoneum.  Musculoskeletal: There are no aggressive appearing lytic or blastic lesions noted in the visualized portions of the skeleton.  IMPRESSION: 1. 6.4 x 6.3 x 6.2 cm gastric neoplasm which appears to arise immediately distal to the gastroesophageal junction. No definite signs of metastatic disease in the chest, abdomen or pelvis on today's examination. 2. 1.6 cm well-defined low-attenuation lesion in segment 7 of the liver is compatible with a simple cyst. There are several tiny sub cm low-attenuation lesions which are too small to definitively characterize the appearance the small lesions suggests tiny cysts, but attention on follow-up studies to ensure stability is recommended. 3. Atherosclerosis, including 3 vessel coronary artery disease. Please note that although the presence of coronary artery calcium documents the presence of coronary artery disease, the severity of this disease and any potential stenosis cannot be assessed on this non-gated CT examination. Assessment for potential risk factor modification, dietary therapy or pharmacologic therapy may be warranted, if clinically indicated. 4. There are calcifications of the mitral annulus. Echocardiographic correlation for evaluation of potential valvular dysfunction may be warranted if clinically indicated. 5. A few scattered tiny pulmonary nodules measuring 3 mm or less in size are highly nonspecific, and favored to be benign, but attention on followup  studies is recommended.   Electronically Signed   By: Vinnie Langton M.D.   On: 06/07/2015 17:27   Ct Abdomen Pelvis W Contrast  06/07/2015   CLINICAL DATA:  69 year old male with abdominal pain since January 2016. Remote history of duodenal ulcer. Newly diagnosed gastric tumor on endoscopy performed 2 days ago. Evaluate for metastatic disease. Recent history of prostate cancer status post radiation therapy, completed in April 2016.  EXAM: CT CHEST, ABDOMEN, AND PELVIS WITH CONTRAST  TECHNIQUE: Multidetector CT imaging of the chest, abdomen and pelvis was performed following the standard protocol during bolus administration of intravenous contrast.  CONTRAST:  161m OMNIPAQUE IOHEXOL 300 MG/ML  SOLN  COMPARISON:  No priors.  FINDINGS: CT CHEST FINDINGS  Mediastinum/Lymph Nodes: Heart size is normal. There is no significant pericardial fluid, thickening or pericardial calcification. There is atherosclerosis of the thoracic aorta, the great vessels of the mediastinum and the coronary arteries, including calcified atherosclerotic plaque in the left anterior descending, left circumflex and right coronary arteries. Severe calcifications of the mitral annulus. No pathologically enlarged mediastinal or hilar lymph nodes. Esophagus is unremarkable in appearance. No axillary lymphadenopathy.  Lungs/Pleura: 3 mm subpleural  nodule along the left major fissure (image 24 of series 3). A few other scattered 1-2 mm pulmonary nodules in the lungs are highly nonspecific. No larger more suspicious lesions to strongly suggest metastatic disease at this time. No acute consolidative airspace disease. No pleural effusions.  Musculoskeletal/Soft Tissues: There are no aggressive appearing lytic or blastic lesions noted in the visualized portions of the skeleton.  CT ABDOMEN AND PELVIS FINDINGS  Hepatobiliary: 1.6 cm well-defined low-attenuation lesion in segment 7 of the liver is compatible with a small simple cyst. Several other tiny  sub cm low-attenuation lesions in the liver are too small to definitively characterize. No intra or extrahepatic biliary ductal dilatation. Gallbladder is normal in appearance.  Pancreas: No pancreatic mass. No pancreatic ductal dilatation. No pancreatic or peripancreatic fluid or inflammatory changes.  Spleen: Unremarkable.  Adrenals/Urinary Tract: Bilateral adrenal glands are normal in appearance. 2.6 cm well-defined low-attenuation lesion in the lateral interpolar left kidney compatible with a simple cyst. Multiple smaller sub cm low-attenuation lesions in the kidneys bilaterally are too small to characterize, but favored to represent tiny cysts. No hydroureteronephrosis. Urinary bladder is normal in appearance.  Stomach/Bowel: Pedunculated 6.4 x 6.3 x 6.2 cm soft tissue mass which appears to arise immediately adjacent to the gastroesophageal junction, and extends into the lumen of the stomach. Mild thickening of the lesser curvature of the stomach adjacent to the mass. No pathologic dilatation of small bowel or colon. Numerous colonic diverticulae are noted, particularly in the sigmoid colon, without surrounding inflammatory changes to suggest an acute diverticulitis at this time. Normal appendix.  Vascular/Lymphatic: Atherosclerosis throughout the abdominal and pelvic vasculature, without evidence of aneurysm. No definite lymphadenopathy noted in the abdomen or pelvis.  Reproductive: Fiducial markers in the prostate gland. Prostate gland and seminal vesicles are grossly unremarkable in appearance.  Other: No significant volume of ascites.  No pneumoperitoneum.  Musculoskeletal: There are no aggressive appearing lytic or blastic lesions noted in the visualized portions of the skeleton.  IMPRESSION: 1. 6.4 x 6.3 x 6.2 cm gastric neoplasm which appears to arise immediately distal to the gastroesophageal junction. No definite signs of metastatic disease in the chest, abdomen or pelvis on today's examination. 2. 1.6  cm well-defined low-attenuation lesion in segment 7 of the liver is compatible with a simple cyst. There are several tiny sub cm low-attenuation lesions which are too small to definitively characterize the appearance the small lesions suggests tiny cysts, but attention on follow-up studies to ensure stability is recommended. 3. Atherosclerosis, including 3 vessel coronary artery disease. Please note that although the presence of coronary artery calcium documents the presence of coronary artery disease, the severity of this disease and any potential stenosis cannot be assessed on this non-gated CT examination. Assessment for potential risk factor modification, dietary therapy or pharmacologic therapy may be warranted, if clinically indicated. 4. There are calcifications of the mitral annulus. Echocardiographic correlation for evaluation of potential valvular dysfunction may be warranted if clinically indicated. 5. A few scattered tiny pulmonary nodules measuring 3 mm or less in size are highly nonspecific, and favored to be benign, but attention on followup studies is recommended.   Electronically Signed   By: Vinnie Langton M.D.   On: 06/07/2015 17:27      Assessment/Plan:   1. Gastric cancer-adenocarcinoma of the gastric fundus/cardia  Staging CT scans 06/07/2015 with no evidence of distant metastatic disease, tiny nonspecific pulmonary nodules and liver lesions-likely benign 2. Microcytic anemia-likely iron deficiency anemia secondary to #1  3.  Vitamin B-12 deficiency -now on vitamin B-12 replacement  4.   T1c prostate cancer-status post external beam radiation completed April 2016   Disposition:   [name] has been diagnosed with adenocarcinoma of the stomach. There is no clinical or CT scan evidence of distant metastatic disease. He appears to be a candidate for surgical resection.  He was seen by myself and Dr. Barry Dienes in the multidisciplinary GI oncology clinic today. Dr. Barry Dienes  recommends a staging EUS. She feels the lesion is resectable with a partial gastrectomy if he is confirmed to have early-stage disease on the EUS.  I discussed alternate treatment options for early stage gastric cancer with Mr. Vick and his wife. We reviewed neoadjuvant and adjuvant chemotherapy. We also discussed the role for adjuvant radiation.  The plan is to proceed with primary resection by Dr. Barry Dienes if he is not confirmed to have more advanced disease at the EUS or with surgical expiration.  Mr. Rachal will begin ferrous sulfate for the apparent iron deficiency anemia. He will return for an office visit after surgery. We will arrange for an appointment with Dr. Valere Dross after surgery to consider the indication for adjuvant radiation.  He was evaluating by the Cancer center nutritionist and physical therapist today.  Approximately 50 minutes were spent with the patient today. The majority of the time was used for counseling and coordination of care.  [place], [place] 06/08/2015, 5:38 PM

## 2015-06-14 NOTE — Discharge Instructions (Signed)
Esophagogastroduodenoscopy °Care After °Refer to this sheet in the next few weeks. These instructions provide you with information on caring for yourself after your procedure. Your caregiver may also give you more specific instructions. Your treatment has been planned according to current medical practices, but problems sometimes occur. Call your caregiver if you have any problems or questions after your procedure.  °HOME CARE INSTRUCTIONS °· Do not eat or drink anything until the numbing medicine (local anesthetic) has worn off and your gag reflex has returned. You will know that the local anesthetic has worn off when you can swallow comfortably. °· Do not drive for 12 hours after the procedure or as directed by your caregiver. °· Only take medicines as directed by your caregiver. °SEEK MEDICAL CARE IF:  °· You cannot stop coughing. °· You are not urinating at all or less than usual. °SEEK IMMEDIATE MEDICAL CARE IF: °· You have difficulty swallowing. °· You cannot eat or drink. °· You have worsening throat or chest pain. °· You have dizziness, lightheadedness, or you faint. °· You have nausea or vomiting. °· You have chills. °· You have a fever. °· You have severe abdominal pain. °· You have black, tarry, or bloody stools. °Document Released: 09/08/2012 Document Reviewed: 09/08/2012 °ExitCare® Patient Information ©2015 [employer]. This information is not intended to replace advice given to you by your health care provider. Make sure you discuss any questions you have with your health care provider. ° °

## 2015-06-14 NOTE — Transfer of Care (Signed)
Immediate Anesthesia Transfer of Care Note  Patient: James [name]  Procedure(s) Performed: Procedure(s): ESOPHAGEAL ENDOSCOPIC ULTRASOUND (EUS) RADIAL (N/A)  Patient Location: PACU and Endoscopy Unit  Anesthesia Type:MAC  Level of Consciousness: awake and patient cooperative  Airway & Oxygen Therapy: Patient Spontanous Breathing and Patient connected to face mask oxygen  Post-op Assessment: Report given to RN and Post -op Vital signs reviewed and stable  Post vital signs: Reviewed and stable  Last Vitals:  Filed Vitals:   06/14/15 0724  BP: 116/64  Temp: 36.7 C  Resp: 28    Complications: No apparent anesthesia complications

## 2015-06-14 NOTE — Anesthesia Preprocedure Evaluation (Signed)
Anesthesia Evaluation  Patient identified by MRN, date of birth, ID band Patient awake    Reviewed: Allergy & Precautions, H&P , NPO status , Patient's Chart, lab work & pertinent test results  Airway Mallampati: II  TM Distance: >3 FB Neck ROM: full    Dental no notable dental hx. (+) Dental Advisory Given   Pulmonary asthma , former smoker,    Pulmonary exam normal breath sounds clear to auscultation       Cardiovascular Exercise Tolerance: Good negative cardio ROS Normal cardiovascular exam Rhythm:regular Rate:Normal     Neuro/Psych negative neurological ROS  negative psych ROS   GI/Hepatic negative GI ROS, Neg liver ROS, GERD  Medicated and Controlled,  Endo/Other  negative endocrine ROS  Renal/GU negative Renal ROS  negative genitourinary   Musculoskeletal   Abdominal   Peds  Hematology negative hematology ROS (+)   Anesthesia Other Findings   Reproductive/Obstetrics negative OB ROS                             Anesthesia Physical Anesthesia Plan  ASA: III  Anesthesia Plan: MAC   Post-op Pain Management:    Induction:   Airway Management Planned:   Additional Equipment:   Intra-op Plan:   Post-operative Plan:   Informed Consent: I have reviewed the patients History and Physical, chart, labs and discussed the procedure including the risks, benefits and alternatives for the proposed anesthesia with the patient or authorized representative who has indicated his/her understanding and acceptance.   Dental Advisory Given  Plan Discussed with: CRNA and Surgeon  Anesthesia Plan Comments:         Anesthesia Quick Evaluation

## 2015-06-14 NOTE — Anesthesia Postprocedure Evaluation (Signed)
  Anesthesia Post-op Note  Patient: James [name]  Procedure(s) Performed: Procedure(s) (LRB): ESOPHAGEAL ENDOSCOPIC ULTRASOUND (EUS) RADIAL (N/A)  Patient Location: PACU  Anesthesia Type: MAC  Level of Consciousness: awake and alert   Airway and Oxygen Therapy: Patient Spontanous Breathing  Post-op Pain: mild  Post-op Assessment: Post-op Vital signs reviewed, Patient's Cardiovascular Status Stable, Respiratory Function Stable, Patent Airway and No signs of Nausea or vomiting  Last Vitals:  Filed Vitals:   06/14/15 0906  BP:   Pulse: 74  Temp:   Resp: 29    Post-op Vital Signs: stable   Complications: No apparent anesthesia complications

## 2015-06-14 NOTE — Op Note (Signed)
[place], [id number]   ENDOSCOPIC ULTRASOUND PROCEDURE REPORT  PATIENT: James [name], James [name]  MR#: [id number] BIRTHDATE: 1946/05/26  GENDER: male ENDOSCOPIST: Milus Banister, MD REFERRED BY:  Gatha Mayer, M.D, [facility] PROCEDURE DATE:  06/14/2015 PROCEDURE:   Upper EUS ASA CLASS:      Class II INDICATIONS:   1.  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 MEDICATIONS: Monitored anesthesia care DESCRIPTION OF PROCEDURE:   After the risks benefits and alternatives of the procedure were  explained, informed consent was obtained. The patient was then placed in the left, lateral, decubitus postion and IV sedation was administered. Throughout the procedure, the patients blood pressure, pulse and oxygen saturations were monitored continuously.  Under direct visualization, the Pentax Radial EUS [id number]  endoscope was introduced through the mouth  and advanced to the second portion of the duodenum .  Water was used as necessary to provide an acoustic interface.  Upon completion of the imaging, water was removed and the patient was sent to the recovery room in satisfactory condition.  Endoscopic findings: 1. Immediately distal to the GE junction (within 1cm of the GE junction) there was an obvious malignant tumor. This is round, large (5cm) and has a broad based, semipedunculated attatchement to the gastri wall just distal to the GE junction.  EUS findings: 1. The mass above correlates with a 5.8cm hypoechoic, heterogeneous mass with broad, stalk-like attatchment (just distal to the GE junction)  The unusual pedunculated nature of this tumor distorts the usual echo landmarks and so I could not clearly discern muscularis propria involvement within the stalk (uTX) 2. There were 2 small (49mm and 3mm), hypoechoic, round lymphnodes adjacent to the proximal stomach that were suspicious for  malignant involvement (uN1). 3. Gastric wall echo-layering was otherwise normal. 4. Limited views of liver, spleen, pancreas, portal and splenic vessels were all normal.  ENDOSCOPIC IMPRESSION: 5.8cm, round, uTXN1 GE junction adenocarcinoma with atypical broad based pedunculated attatchment to gastric wall just distal to the GE junction.  Precise T staging was not possible due to the anatomic distortion related to the broad based stalk.  RECOMMENDATIONS: Given suspicious nearby adenopathy described above, could consider neo-adjuvant chemo/XRT.  However this is an atypical morphology for GE junction (gastric side) adenocarcinoma and up front surgery is still very reasonable.  _______________________________ eSigned:  Milus Banister, MD 06/14/2015 8:51 AM  cc: Drs. Antony Contras, [place]

## 2015-06-14 NOTE — Interval H&P Note (Signed)
History and Physical Interval Note:  06/14/2015 8:07 AM  James [name]  has presented today for surgery, with the diagnosis of gastric cancer  The various methods of treatment have been discussed with the patient and family. After consideration of risks, benefits and other options for treatment, the patient has consented to  Procedure(s): ESOPHAGEAL ENDOSCOPIC ULTRASOUND (EUS) RADIAL (N/A) as a surgical intervention .  The patient's history has been reviewed, patient examined, no change in status, stable for surgery.  I have reviewed the patient's chart and labs.  Questions were answered to the patient's satisfaction.     Milus Banister

## 2015-06-18 ENCOUNTER — Encounter: Payer: Self-pay | Admitting: Internal Medicine

## 2015-06-19 ENCOUNTER — Encounter (HOSPITAL_COMMUNITY): Payer: Self-pay | Admitting: Gastroenterology

## 2015-06-20 ENCOUNTER — Ambulatory Visit (INDEPENDENT_AMBULATORY_CARE_PROVIDER_SITE_OTHER): Payer: Medicare Other

## 2015-06-20 DIAGNOSIS — E538 Deficiency of other specified B group vitamins: Secondary | ICD-10-CM | POA: Diagnosis not present

## 2015-06-25 ENCOUNTER — Telehealth: Payer: Self-pay | Admitting: Internal Medicine

## 2015-06-25 DIAGNOSIS — F329 Major depressive disorder, single episode, unspecified: Secondary | ICD-10-CM

## 2015-06-25 NOTE — Telephone Encounter (Signed)
Patient asking for an anti-depressant you discussed at the last office visit

## 2015-06-26 ENCOUNTER — Encounter: Payer: Self-pay | Admitting: Internal Medicine

## 2015-06-26 DIAGNOSIS — F329 Major depressive disorder, single episode, unspecified: Secondary | ICD-10-CM | POA: Insufficient documentation

## 2015-06-26 MED ORDER — CYANOCOBALAMIN 1000 MCG/ML IJ SOLN
1000.0000 ug | Freq: Once | INTRAMUSCULAR | Status: AC
  Administered 2015-06-13: 1000 ug via INTRAMUSCULAR

## 2015-06-26 MED ORDER — MIRTAZAPINE 15 MG PO TABS: 15.0000 mg | ORAL_TABLET | Freq: Every day | ORAL | Status: DC

## 2015-06-26 NOTE — Telephone Encounter (Signed)
I called and will rx mirtazipine  He is not sleeping at night and naps during day, has nausea and anorexia  Side effects discussed

## 2015-06-28 ENCOUNTER — Ambulatory Visit (INDEPENDENT_AMBULATORY_CARE_PROVIDER_SITE_OTHER): Payer: Medicare Other | Admitting: *Deleted

## 2015-06-28 DIAGNOSIS — E538 Deficiency of other specified B group vitamins: Secondary | ICD-10-CM

## 2015-06-28 MED ORDER — CYANOCOBALAMIN 1000 MCG/ML IJ SOLN
1000.0000 ug | Freq: Once | INTRAMUSCULAR | Status: AC
  Administered 2015-06-28: 1000 ug via INTRAMUSCULAR

## 2015-07-05 ENCOUNTER — Ambulatory Visit (INDEPENDENT_AMBULATORY_CARE_PROVIDER_SITE_OTHER): Payer: Medicare Other | Admitting: *Deleted

## 2015-07-05 DIAGNOSIS — E538 Deficiency of other specified B group vitamins: Secondary | ICD-10-CM

## 2015-07-05 DIAGNOSIS — Z23 Encounter for immunization: Secondary | ICD-10-CM

## 2015-07-05 MED ORDER — CYANOCOBALAMIN 1000 MCG/ML IJ SOLN
1000.0000 ug | Freq: Once | INTRAMUSCULAR | Status: AC
  Administered 2015-07-05: 1000 ug via INTRAMUSCULAR

## 2015-07-09 ENCOUNTER — Other Ambulatory Visit: Payer: Self-pay | Admitting: *Deleted

## 2015-07-09 ENCOUNTER — Encounter: Payer: Self-pay | Admitting: Internal Medicine

## 2015-07-09 ENCOUNTER — Ambulatory Visit (INDEPENDENT_AMBULATORY_CARE_PROVIDER_SITE_OTHER): Payer: Medicare Other | Admitting: Internal Medicine

## 2015-07-09 ENCOUNTER — Telehealth: Payer: Self-pay | Admitting: *Deleted

## 2015-07-09 VITALS — BP 112/70 | HR 95 | Temp 99.3°F | Resp 16 | Ht 67.0 in | Wt 188.0 lb

## 2015-07-09 DIAGNOSIS — J0110 Acute frontal sinusitis, unspecified: Secondary | ICD-10-CM | POA: Diagnosis not present

## 2015-07-09 MED ORDER — AMOXICILLIN 500 MG PO CAPS: 500.0000 mg | ORAL_CAPSULE | Freq: Three times a day (TID) | ORAL | Status: DC

## 2015-07-09 NOTE — Progress Notes (Signed)
Pre visit review using our clinic review tool, if applicable. No additional management support is needed unless otherwise documented below in the visit note. 

## 2015-07-09 NOTE — Telephone Encounter (Signed)
Oncology Nurse Navigator Documentation  Oncology Nurse Navigator Flowsheets 07/09/2015  Referral date to RadOnc/MedOnc -  Navigator Encounter Type Telephone  Patient Visit Type -  Treatment Phase -  Barriers/Navigation Needs -  Education -  Interventions Coordination of Care--surgery scheduled for 07/18/15  Coordination of Care MD Appointments-notified MD and POF to be seen postop by Dr. Benay Spice  Education Method -  Support Groups/Services -  Time Spent with Wife 5

## 2015-07-09 NOTE — Progress Notes (Signed)
   Subjective:    Patient ID: James [name], male    DOB: 1946-06-24, 69 y.o.   MRN: [id number]  HPI He has had symptoms for 3-4 weeks consisting of frontal and maxillary sinus area headache. He also has had sweats at night and chills during the day. Temperature maximum has been 99.7. Cough is nonproductive. He does have pain in his ears at night which he relates to wearing hearing aids. He's been using over-the-counter allergy and sinus medicines.  Review of Systems   He denies nasal purulence, dental pain, sore throat, otic discharge. Other than occasional itchy, watery eyes; he has no extrinsic symptoms  The cough is not associated with wheezing or shortness of breath.     Objective:   Physical Exam  General appearance:Adequately nourished; no acute distress or increased work of breathing is present.    Lymphatic: No  lymphadenopathy about the head, neck, or axilla .  Eyes: No conjunctival inflammation or lid edema is present. There is no scleral icterus.  Ears:  External ear exam shows no significant lesions or deformities.  Otoscopic examination reveals clear canals, tympanic membranes are intact bilaterally without bulging, retraction, inflammation or discharge. He is not wearing his hearing aids.  Nose:  External nasal examination shows no deformity or inflammation. Nasal mucosa are dry and minimally erythematous. without lesions or exudates No septal dislocation or deviation.No obstruction to airflow.   Oral exam: Dental hygiene is good; lips and gums are healthy appearing.There is no oropharyngeal erythema or exudate .  Neck:  No deformities, thyromegaly, masses, or tenderness noted.   Supple with full range of motion without pain.   Heart:  Normal rate and regular rhythm. S1 and S2 normal without gallop, murmur, click, rub or other extra sounds.   Lungs:Chest clear to auscultation; no wheezes, rhonchi,rales ,or rubs present.  Extremities:  No cyanosis, edema, or clubbing   noted    Skin: Warm & dry w/o tenting or jaundice. No significant lesions or rash. He does have myriad keratoses over the posterior thorax     Assessment & Plan:  #1 acute upper respiratory tract infection; possible frontal and maxillary sinusitis  Plan: See orders

## 2015-07-09 NOTE — Patient Instructions (Signed)

## 2015-07-10 ENCOUNTER — Telehealth: Payer: Self-pay | Admitting: Oncology

## 2015-07-10 NOTE — Telephone Encounter (Signed)
s.w. pt oand advised on OCT 14 appt moved to 10.31 per pof...pt ok and aware

## 2015-07-12 NOTE — Patient Instructions (Addendum)
YOUR PROCEDURE IS SCHEDULED ON :  07/18/15  REPORT TO [facility] MAIN ENTRANCE FOLLOW SIGNS TO EAST ELEVATOR - GO TO 3rd FLOOR CHECK IN AT 3 EAST NURSES STATION (SHORT STAY) AT:  7:00 AM  CALL THIS NUMBER IF YOU HAVE PROBLEMS THE MORNING OF SURGERY [phone]  REMEMBER:ONLY 1 PER PERSON MAY GO TO SHORT STAY WITH YOU TO GET READY THE MORNING OF YOUR SURGERY  DO NOT EAT FOOD OR DRINK LIQUIDS AFTER MIDNIGHT  TAKE THESE MEDICINES THE MORNING OF SURGERY: AMOXICILLIN  YOU MAY NOT HAVE ANY METAL ON YOUR BODY INCLUDING HAIR PINS AND PIERCING'S. DO NOT WEAR JEWELRY, MAKEUP, LOTIONS, POWDERS OR PERFUMES. DO NOT WEAR NAIL POLISH. DO NOT SHAVE 48 HRS PRIOR TO SURGERY. MEN MAY SHAVE FACE AND NECK.  DO NOT Victor. [facility] IS NOT RESPONSIBLE FOR VALUABLES.  CONTACTS, DENTURES OR PARTIALS MAY NOT BE WORN TO SURGERY. LEAVE SUITCASE IN CAR. CAN BE BROUGHT TO ROOM AFTER SURGERY.  PATIENTS DISCHARGED THE DAY OF SURGERY WILL NOT BE ALLOWED TO DRIVE HOME.  PLEASE READ OVER THE FOLLOWING INSTRUCTION SHEETS _________________________________________________________________________________                                           - PREPARING FOR SURGERY  Before surgery, you can play an important role.  Because skin is not sterile, your skin needs to be as free of germs as possible.  You can reduce the number of germs on your skin by washing with CHG (chlorahexidine gluconate) soap before surgery.  CHG is an antiseptic cleaner which kills germs and bonds with the skin to continue killing germs even after washing. Please DO NOT use if you have an allergy to CHG or antibacterial soaps.  If your skin becomes reddened/irritated stop using the CHG and inform your nurse when you arrive at Short Stay. Do not shave (including legs and underarms) for at least 48 hours prior to the first CHG shower.  You may shave your face. Please follow these instructions  carefully:   1.  Shower with CHG Soap the night before surgery and the  morning of Surgery.   2.  If you choose to wash your hair, wash your hair first as usual with your  normal  Shampoo.   3.  After you shampoo, rinse your hair and body thoroughly to remove the  shampoo.                                         4.  Use CHG as you would any other liquid soap.  You can apply chg directly  to the skin and wash . Gently wash with scrungie or clean wascloth    5.  Apply the CHG Soap to your body ONLY FROM THE NECK DOWN.   Do not use on open                           Wound or open sores. Avoid contact with eyes, ears mouth and genitals (private parts).                        Genitals (private parts) with your normal soap.  6.  Wash thoroughly, paying special attention to the area where your surgery  will be performed.   7.  Thoroughly rinse your body with warm water from the neck down.   8.  DO NOT shower/wash with your normal soap after using and rinsing off  the CHG Soap .                9.  Pat yourself dry with a clean towel.             10.  Wear clean night clothes to bed after shower             11.  Place clean sheets on your bed the night of your first shower and do not  sleep with pets.  Day of Surgery : Do not apply any lotions/deodorants the morning of surgery.  Please wear clean clothes to the hospital/surgery center.  FAILURE TO FOLLOW THESE INSTRUCTIONS MAY RESULT IN THE CANCELLATION OF YOUR SURGERY    PATIENT SIGNATURE_________________________________  ______________________________________________________________________    WHAT IS A BLOOD TRANSFUSION? Blood Transfusion Information  A transfusion is the replacement of blood or some of its parts. Blood is made up of multiple cells which provide different functions.  Red blood cells carry oxygen and are used for blood loss replacement.  White blood cells fight against infection.  Platelets control  bleeding.  Plasma helps clot blood.  Other blood products are available for specialized needs, such as hemophilia or other clotting disorders. BEFORE THE TRANSFUSION  Who gives blood for transfusions?   Healthy volunteers who are fully evaluated to make sure their blood is safe. This is blood bank blood. Transfusion therapy is the safest it has ever been in the practice of medicine. Before blood is taken from a donor, a complete history is taken to make sure that person has no history of diseases nor engages in risky social behavior (examples are intravenous drug use or sexual activity with multiple partners). The donor's travel history is screened to minimize risk of transmitting infections, such as malaria. The donated blood is tested for signs of infectious diseases, such as HIV and hepatitis. The blood is then tested to be sure it is compatible with you in order to minimize the chance of a transfusion reaction. If you or a relative donates blood, this is often done in anticipation of surgery and is not appropriate for emergency situations. It takes many days to process the donated blood. RISKS AND COMPLICATIONS Although transfusion therapy is very safe and saves many lives, the main dangers of transfusion include:   Getting an infectious disease.  Developing a transfusion reaction. This is an allergic reaction to something in the blood you were given. Every precaution is taken to prevent this. The decision to have a blood transfusion has been considered carefully by your caregiver before blood is given. Blood is not given unless the benefits outweigh the risks. AFTER THE TRANSFUSION  Right after receiving a blood transfusion, you will usually feel much better and more energetic. This is especially true if your red blood cells have gotten low (anemic). The transfusion raises the level of the red blood cells which carry oxygen, and this usually causes an energy increase.  The nurse  administering the transfusion will monitor you carefully for complications. HOME CARE INSTRUCTIONS  No special instructions are needed after a transfusion. You may find your energy is better. Speak with your caregiver about any limitations on activity for underlying diseases you may have.  SEEK MEDICAL CARE IF:   Your condition is not improving after your transfusion.  You develop redness or irritation at the intravenous (IV) site. SEEK IMMEDIATE MEDICAL CARE IF:  Any of the following symptoms occur over the next 12 hours:  Shaking chills.  You have a temperature by mouth above 102 F (38.9 C), not controlled by medicine.  Chest, back, or muscle pain.  People around you feel you are not acting correctly or are confused.  Shortness of breath or difficulty breathing.  Dizziness and fainting.  You get a rash or develop hives.  You have a decrease in urine output.  Your urine turns a dark color or changes to pink, red, or brown. Any of the following symptoms occur over the next 10 days:  You have a temperature by mouth above 102 F (38.9 C), not controlled by medicine.  Shortness of breath.  Weakness after normal activity.  The white part of the eye turns yellow (jaundice).  You have a decrease in the amount of urine or are urinating less often.  Your urine turns a dark color or changes to pink, red, or brown. Document Released: 09/19/2000 Document Revised: 12/15/2011 Document Reviewed: 05/08/2008 [facility] Patient Information 2014 [place], [place].  _______________________________________________________________________

## 2015-07-13 ENCOUNTER — Encounter: Payer: Self-pay | Admitting: Anatomic Pathology & Clinical Pathology

## 2015-07-13 ENCOUNTER — Encounter (HOSPITAL_COMMUNITY)
Admission: RE | Admit: 2015-07-13 | Discharge: 2015-07-13 | Disposition: A | Discharge status: Home / Self Care | Payer: Medicare Other | Source: Ambulatory Visit | Attending: General Surgery | Admitting: General Surgery

## 2015-07-13 ENCOUNTER — Encounter (HOSPITAL_COMMUNITY): Payer: Self-pay

## 2015-07-13 DIAGNOSIS — C169 Malignant neoplasm of stomach, unspecified: Secondary | ICD-10-CM | POA: Diagnosis not present

## 2015-07-13 DIAGNOSIS — Z01818 Encounter for other preprocedural examination: Secondary | ICD-10-CM | POA: Diagnosis present

## 2015-07-13 HISTORY — DX: Basal cell carcinoma of skin, unspecified: C44.91

## 2015-07-13 HISTORY — DX: Malignant neoplasm of stomach, unspecified: C16.9

## 2015-07-13 LAB — COMPREHENSIVE METABOLIC PANEL
ALK PHOS: 74 U/L (ref 38–126)
ALT: 7 U/L — AB (ref 17–63)
ANION GAP: 7 (ref 5–15)
AST: 13 U/L — ABNORMAL LOW (ref 15–41)
Albumin: 2.8 g/dL — ABNORMAL LOW (ref 3.5–5.0)
BILIRUBIN TOTAL: 0.1 mg/dL — AB (ref 0.3–1.2)
BUN: 11 mg/dL (ref 6–20)
CALCIUM: 8.6 mg/dL — AB (ref 8.9–10.3)
CO2: 27 mmol/L (ref 22–32)
CREATININE: 0.68 mg/dL (ref 0.61–1.24)
Chloride: 103 mmol/L (ref 101–111)
Glucose, Bld: 121 mg/dL — ABNORMAL HIGH (ref 65–99)
Potassium: 4 mmol/L (ref 3.5–5.1)
Sodium: 137 mmol/L (ref 135–145)
TOTAL PROTEIN: 6.9 g/dL (ref 6.5–8.1)

## 2015-07-13 LAB — CBC
HCT: 31.1 % — ABNORMAL LOW (ref 39.0–52.0)
HEMOGLOBIN: 9.3 g/dL — AB (ref 13.0–17.0)
MCH: 22.4 pg — AB (ref 26.0–34.0)
MCHC: 29.9 g/dL — AB (ref 30.0–36.0)
MCV: 74.8 fL — AB (ref 78.0–100.0)
Platelets: 294 10*3/uL (ref 150–400)
RBC: 4.16 MIL/uL — AB (ref 4.22–5.81)
RDW: 16.1 % — ABNORMAL HIGH (ref 11.5–15.5)
WBC: 6.4 10*3/uL (ref 4.0–10.5)

## 2015-07-13 LAB — PREPARE RBC (CROSSMATCH)

## 2015-07-13 LAB — ABO/RH: ABO/RH(D): O NEG

## 2015-07-18 ENCOUNTER — Inpatient Hospital Stay (HOSPITAL_COMMUNITY)
Admission: RE | Admit: 2015-07-18 | Discharge: 2015-07-24 | DRG: 375 | Disposition: A | Discharge status: Home Health | Payer: Medicare Other | Source: Ambulatory Visit | Attending: General Surgery | Admitting: General Surgery

## 2015-07-18 ENCOUNTER — Encounter (HOSPITAL_COMMUNITY)
Admission: RE | Disposition: A | Discharge status: Home Health | Payer: Self-pay | Source: Ambulatory Visit | Attending: General Surgery

## 2015-07-18 ENCOUNTER — Inpatient Hospital Stay (HOSPITAL_COMMUNITY): Payer: Medicare Other | Admitting: Anesthesiology

## 2015-07-18 ENCOUNTER — Encounter (HOSPITAL_COMMUNITY): Payer: Self-pay | Admitting: *Deleted

## 2015-07-18 DIAGNOSIS — C169 Malignant neoplasm of stomach, unspecified: Secondary | ICD-10-CM

## 2015-07-18 DIAGNOSIS — Z833 Family history of diabetes mellitus: Secondary | ICD-10-CM

## 2015-07-18 DIAGNOSIS — Z807 Family history of other malignant neoplasms of lymphoid, hematopoietic and related tissues: Secondary | ICD-10-CM | POA: Diagnosis not present

## 2015-07-18 DIAGNOSIS — C161 Malignant neoplasm of fundus of stomach: Principal | ICD-10-CM | POA: Diagnosis present

## 2015-07-18 DIAGNOSIS — K2940 Chronic atrophic gastritis without bleeding: Secondary | ICD-10-CM | POA: Diagnosis present

## 2015-07-18 DIAGNOSIS — Z01812 Encounter for preprocedural laboratory examination: Secondary | ICD-10-CM

## 2015-07-18 DIAGNOSIS — Z8546 Personal history of malignant neoplasm of prostate: Secondary | ICD-10-CM | POA: Diagnosis not present

## 2015-07-18 DIAGNOSIS — K913 Postprocedural intestinal obstruction: Secondary | ICD-10-CM | POA: Diagnosis not present

## 2015-07-18 DIAGNOSIS — R1310 Dysphagia, unspecified: Secondary | ICD-10-CM | POA: Diagnosis present

## 2015-07-18 DIAGNOSIS — Z8249 Family history of ischemic heart disease and other diseases of the circulatory system: Secondary | ICD-10-CM | POA: Diagnosis not present

## 2015-07-18 DIAGNOSIS — Z79899 Other long term (current) drug therapy: Secondary | ICD-10-CM

## 2015-07-18 DIAGNOSIS — C165 Malignant neoplasm of lesser curvature of stomach, unspecified: Secondary | ICD-10-CM | POA: Diagnosis present

## 2015-07-18 HISTORY — DX: Malignant neoplasm of stomach, unspecified: C16.9

## 2015-07-18 HISTORY — PX: LAPAROSCOPIC GASTRECTOMY: SHX5894

## 2015-07-18 HISTORY — PX: LAPAROSCOPY: SHX197

## 2015-07-18 HISTORY — PX: LAPAROSCOPIC GASTROSTOMY: SHX5896

## 2015-07-18 LAB — CREATININE, SERUM: CREATININE: 0.69 mg/dL (ref 0.61–1.24)

## 2015-07-18 LAB — CBC
HCT: 29.2 % — ABNORMAL LOW (ref 39.0–52.0)
Hemoglobin: 8.7 g/dL — ABNORMAL LOW (ref 13.0–17.0)
MCH: 22 pg — ABNORMAL LOW (ref 26.0–34.0)
MCHC: 29.8 g/dL — ABNORMAL LOW (ref 30.0–36.0)
MCV: 73.7 fL — ABNORMAL LOW (ref 78.0–100.0)
PLATELETS: 309 10*3/uL (ref 150–400)
RBC: 3.96 MIL/uL — AB (ref 4.22–5.81)
RDW: 16.2 % — AB (ref 11.5–15.5)
WBC: 12.4 10*3/uL — AB (ref 4.0–10.5)

## 2015-07-18 SURGERY — LAPAROSCOPY, DIAGNOSTIC
Anesthesia: General | Site: Abdomen

## 2015-07-18 MED ORDER — CEFAZOLIN SODIUM-DEXTROSE 2-3 GM-% IV SOLR
INTRAVENOUS | Status: AC
  Filled 2015-07-18: qty 50

## 2015-07-18 MED ORDER — DEXAMETHASONE SODIUM PHOSPHATE 10 MG/ML IJ SOLN
INTRAMUSCULAR | Status: AC
  Filled 2015-07-18: qty 1

## 2015-07-18 MED ORDER — LIDOCAINE HCL (CARDIAC) 20 MG/ML IV SOLN
INTRAVENOUS | Status: AC
  Filled 2015-07-18: qty 5

## 2015-07-18 MED ORDER — ONDANSETRON HCL 4 MG/2ML IJ SOLN: 4.0000 mg | Freq: Four times a day (QID) | INTRAMUSCULAR | Status: DC | PRN

## 2015-07-18 MED ORDER — GLYCOPYRROLATE 0.2 MG/ML IJ SOLN
INTRAMUSCULAR | Status: AC
  Filled 2015-07-18: qty 3

## 2015-07-18 MED ORDER — ACETAMINOPHEN 650 MG RE SUPP: 650.0000 mg | Freq: Four times a day (QID) | RECTAL | Status: DC | PRN

## 2015-07-18 MED ORDER — TISSEEL VH 10 ML EX KIT
PACK | CUTANEOUS | Status: AC
  Filled 2015-07-18: qty 1

## 2015-07-18 MED ORDER — FENTANYL CITRATE (PF) 100 MCG/2ML IJ SOLN
INTRAMUSCULAR | Status: AC
  Filled 2015-07-18: qty 4

## 2015-07-18 MED ORDER — LACTATED RINGERS IV SOLN
INTRAVENOUS | Status: DC
  Administered 2015-07-18: 1000 mL via INTRAVENOUS

## 2015-07-18 MED ORDER — FENTANYL CITRATE (PF) 250 MCG/5ML IJ SOLN
INTRAMUSCULAR | Status: AC
  Filled 2015-07-18: qty 25

## 2015-07-18 MED ORDER — PANTOPRAZOLE SODIUM 40 MG IV SOLR
40.0000 mg | Freq: Every day | INTRAVENOUS | Status: DC
  Administered 2015-07-18 – 2015-07-23 (×6): 40 mg via INTRAVENOUS
  Filled 2015-07-18 (×8): qty 40

## 2015-07-18 MED ORDER — PROPOFOL 10 MG/ML IV BOLUS
INTRAVENOUS | Status: DC | PRN
  Administered 2015-07-18: 160 mg via INTRAVENOUS

## 2015-07-18 MED ORDER — FENTANYL CITRATE (PF) 100 MCG/2ML IJ SOLN
INTRAMUSCULAR | Status: AC
  Administered 2015-07-18: 50 ug via INTRAVENOUS
  Filled 2015-07-18: qty 2

## 2015-07-18 MED ORDER — DIPHENHYDRAMINE HCL 12.5 MG/5ML PO ELIX: 12.5000 mg | ORAL_SOLUTION | Freq: Four times a day (QID) | ORAL | Status: DC | PRN

## 2015-07-18 MED ORDER — MIDAZOLAM HCL 5 MG/5ML IJ SOLN
INTRAMUSCULAR | Status: DC | PRN
  Administered 2015-07-18: 2 mg via INTRAVENOUS

## 2015-07-18 MED ORDER — ACETAMINOPHEN 10 MG/ML IV SOLN
1000.0000 mg | Freq: Once | INTRAVENOUS | Status: AC
  Administered 2015-07-18: 1000 mg via INTRAVENOUS

## 2015-07-18 MED ORDER — CEFAZOLIN SODIUM-DEXTROSE 2-3 GM-% IV SOLR
2.0000 g | Freq: Three times a day (TID) | INTRAVENOUS | Status: AC
  Administered 2015-07-18: 2 g via INTRAVENOUS
  Filled 2015-07-18: qty 50

## 2015-07-18 MED ORDER — NEOSTIGMINE METHYLSULFATE 10 MG/10ML IV SOLN
INTRAVENOUS | Status: AC
  Filled 2015-07-18: qty 1

## 2015-07-18 MED ORDER — SODIUM CHLORIDE 0.9 % IJ SOLN: 9.0000 mL | INTRAMUSCULAR | Status: DC | PRN

## 2015-07-18 MED ORDER — FENTANYL CITRATE (PF) 100 MCG/2ML IJ SOLN
25.0000 ug | INTRAMUSCULAR | Status: DC | PRN
  Administered 2015-07-18 (×2): 50 ug via INTRAVENOUS

## 2015-07-18 MED ORDER — BUPIVACAINE-EPINEPHRINE (PF) 0.25% -1:200000 IJ SOLN
INTRAMUSCULAR | Status: AC
  Filled 2015-07-18: qty 30

## 2015-07-18 MED ORDER — PHENYLEPHRINE HCL 10 MG/ML IJ SOLN
INTRAMUSCULAR | Status: DC | PRN
  Administered 2015-07-18: 80 ug via INTRAVENOUS
  Administered 2015-07-18 (×2): 40 ug via INTRAVENOUS

## 2015-07-18 MED ORDER — MORPHINE SULFATE 1 MG/ML IV SOLN
INTRAVENOUS | Status: AC
  Administered 2015-07-18: 1 mg via INTRAVENOUS
  Filled 2015-07-18: qty 25

## 2015-07-18 MED ORDER — CETYLPYRIDINIUM CHLORIDE 0.05 % MT LIQD
7.0000 mL | Freq: Two times a day (BID) | OROMUCOSAL | Status: DC
  Administered 2015-07-20 – 2015-07-21 (×2): 7 mL via OROMUCOSAL

## 2015-07-18 MED ORDER — HYDRALAZINE HCL 20 MG/ML IJ SOLN: 10.0000 mg | INTRAMUSCULAR | Status: DC | PRN

## 2015-07-18 MED ORDER — ENOXAPARIN SODIUM 40 MG/0.4ML ~~LOC~~ SOLN
40.0000 mg | SUBCUTANEOUS | Status: DC
  Administered 2015-07-19 – 2015-07-24 (×6): 40 mg via SUBCUTANEOUS
  Filled 2015-07-18 (×6): qty 0.4

## 2015-07-18 MED ORDER — MORPHINE SULFATE 1 MG/ML IV SOLN
INTRAVENOUS | Status: DC
  Administered 2015-07-18: 9.9 mg via INTRAVENOUS
  Administered 2015-07-18: 20:00:00 via INTRAVENOUS
  Administered 2015-07-18: 1.5 mg via INTRAVENOUS
  Administered 2015-07-18: 1 mg via INTRAVENOUS
  Administered 2015-07-19: 3 mL via INTRAVENOUS
  Administered 2015-07-19: 06:00:00 via INTRAVENOUS
  Administered 2015-07-19: 13.5 mg via INTRAVENOUS
  Administered 2015-07-19 (×2): 10.5 mg via INTRAVENOUS
  Administered 2015-07-19: 17.17 mg via INTRAVENOUS
  Administered 2015-07-19: 1 mg via INTRAVENOUS
  Administered 2015-07-20: 12 mg via INTRAVENOUS
  Filled 2015-07-18 (×3): qty 25

## 2015-07-18 MED ORDER — HYDROMORPHONE HCL 1 MG/ML IJ SOLN
INTRAMUSCULAR | Status: DC | PRN
Start: 2015-07-18 — End: 2015-07-18
  Administered 2015-07-18 (×2): 1 mg via INTRAVENOUS

## 2015-07-18 MED ORDER — KCL IN DEXTROSE-NACL 20-5-0.45 MEQ/L-%-% IV SOLN
INTRAVENOUS | Status: DC
  Administered 2015-07-18: 17:00:00 via INTRAVENOUS
  Administered 2015-07-19 (×2): 100 mL/h via INTRAVENOUS
  Administered 2015-07-20: 11:00:00 via INTRAVENOUS
  Filled 2015-07-18 (×6): qty 1000

## 2015-07-18 MED ORDER — GLYCOPYRROLATE 0.2 MG/ML IJ SOLN
INTRAMUSCULAR | Status: DC | PRN
  Administered 2015-07-18: 0.6 mg via INTRAVENOUS

## 2015-07-18 MED ORDER — ROCURONIUM BROMIDE 100 MG/10ML IV SOLN
INTRAVENOUS | Status: DC | PRN
  Administered 2015-07-18 (×2): 10 mg via INTRAVENOUS
  Administered 2015-07-18: 5 mg via INTRAVENOUS
  Administered 2015-07-18: 10 mg via INTRAVENOUS
  Administered 2015-07-18: 35 mg via INTRAVENOUS
  Administered 2015-07-18 (×2): 10 mg via INTRAVENOUS

## 2015-07-18 MED ORDER — MORPHINE SULFATE (PF) 2 MG/ML IV SOLN
1.0000 mg | INTRAVENOUS | Status: DC | PRN
  Administered 2015-07-20 – 2015-07-22 (×10): 2 mg via INTRAVENOUS
  Administered 2015-07-22: 1 mg via INTRAVENOUS
  Administered 2015-07-23 (×2): 2 mg via INTRAVENOUS
  Filled 2015-07-18 (×14): qty 1

## 2015-07-18 MED ORDER — LACTATED RINGERS IV SOLN: INTRAVENOUS | Status: DC

## 2015-07-18 MED ORDER — ONDANSETRON HCL 4 MG/2ML IJ SOLN
INTRAMUSCULAR | Status: DC | PRN
  Administered 2015-07-18: 4 mg via INTRAVENOUS

## 2015-07-18 MED ORDER — CHLORHEXIDINE GLUCONATE 0.12 % MT SOLN
15.0000 mL | Freq: Two times a day (BID) | OROMUCOSAL | Status: DC
  Administered 2015-07-18 – 2015-07-24 (×12): 15 mL via OROMUCOSAL
  Filled 2015-07-18 (×14): qty 15

## 2015-07-18 MED ORDER — DIPHENHYDRAMINE HCL 50 MG/ML IJ SOLN: 12.5000 mg | Freq: Four times a day (QID) | INTRAMUSCULAR | Status: DC | PRN

## 2015-07-18 MED ORDER — ACETAMINOPHEN 325 MG PO TABS: 650.0000 mg | ORAL_TABLET | Freq: Four times a day (QID) | ORAL | Status: DC | PRN

## 2015-07-18 MED ORDER — NALOXONE HCL 0.4 MG/ML IJ SOLN: 0.4000 mg | INTRAMUSCULAR | Status: DC | PRN

## 2015-07-18 MED ORDER — MIDAZOLAM HCL 2 MG/2ML IJ SOLN
INTRAMUSCULAR | Status: AC
  Filled 2015-07-18: qty 4

## 2015-07-18 MED ORDER — PHENYLEPHRINE 40 MCG/ML (10ML) SYRINGE FOR IV PUSH (FOR BLOOD PRESSURE SUPPORT)
PREFILLED_SYRINGE | INTRAVENOUS | Status: AC
  Filled 2015-07-18: qty 10

## 2015-07-18 MED ORDER — BUPIVACAINE ON-Q PAIN PUMP (FOR ORDER SET NO CHG)
INJECTION | Status: AC
  Filled 2015-07-18: qty 1

## 2015-07-18 MED ORDER — HYDROMORPHONE HCL 2 MG/ML IJ SOLN
INTRAMUSCULAR | Status: AC
  Filled 2015-07-18: qty 1

## 2015-07-18 MED ORDER — ACETAMINOPHEN 10 MG/ML IV SOLN
1000.0000 mg | Freq: Four times a day (QID) | INTRAVENOUS | Status: AC
Start: 2015-07-18 — End: 2015-07-19
  Administered 2015-07-18 – 2015-07-19 (×4): 1000 mg via INTRAVENOUS
  Filled 2015-07-18 (×4): qty 100

## 2015-07-18 MED ORDER — BUPIVACAINE-EPINEPHRINE 0.25% -1:200000 IJ SOLN
INTRAMUSCULAR | Status: DC | PRN
  Administered 2015-07-18: 5 mL

## 2015-07-18 MED ORDER — ONDANSETRON 4 MG PO TBDP: 4.0000 mg | ORAL_TABLET | Freq: Four times a day (QID) | ORAL | Status: DC | PRN

## 2015-07-18 MED ORDER — NEOSTIGMINE METHYLSULFATE 10 MG/10ML IV SOLN
INTRAVENOUS | Status: DC | PRN
  Administered 2015-07-18: 4 mg via INTRAVENOUS

## 2015-07-18 MED ORDER — HYDROMORPHONE HCL 1 MG/ML IJ SOLN
INTRAMUSCULAR | Status: AC
  Administered 2015-07-18: 0.5 mg via INTRAVENOUS
  Filled 2015-07-18: qty 1

## 2015-07-18 MED ORDER — LACTATED RINGERS IV SOLN
INTRAVENOUS | Status: DC | PRN
  Administered 2015-07-18 (×3): via INTRAVENOUS

## 2015-07-18 MED ORDER — LIDOCAINE HCL 1 % IJ SOLN
INTRAMUSCULAR | Status: AC
  Filled 2015-07-18: qty 40

## 2015-07-18 MED ORDER — ONDANSETRON HCL 4 MG/2ML IJ SOLN
4.0000 mg | Freq: Four times a day (QID) | INTRAMUSCULAR | Status: DC | PRN
  Filled 2015-07-18: qty 2

## 2015-07-18 MED ORDER — BUPIVACAINE 0.25 % ON-Q PUMP DUAL CATH 300 ML
300.0000 mL | INJECTION | Status: DC
  Administered 2015-07-18: 300 mL
  Filled 2015-07-18: qty 300

## 2015-07-18 MED ORDER — ONDANSETRON HCL 4 MG/2ML IJ SOLN
INTRAMUSCULAR | Status: AC
  Filled 2015-07-18: qty 2

## 2015-07-18 MED ORDER — LIDOCAINE HCL (CARDIAC) 20 MG/ML IV SOLN
INTRAVENOUS | Status: DC | PRN
  Administered 2015-07-18: 50 mg via INTRAVENOUS

## 2015-07-18 MED ORDER — SUCCINYLCHOLINE CHLORIDE 20 MG/ML IJ SOLN
INTRAMUSCULAR | Status: DC | PRN
  Administered 2015-07-18: 100 mg via INTRAVENOUS

## 2015-07-18 MED ORDER — SODIUM CHLORIDE 0.9 % IR SOLN
Status: DC | PRN
  Administered 2015-07-18: 3000 mL

## 2015-07-18 MED ORDER — LIDOCAINE HCL 1 % IJ SOLN
INTRAMUSCULAR | Status: DC | PRN
  Administered 2015-07-18: 5 mL

## 2015-07-18 MED ORDER — ROCURONIUM BROMIDE 100 MG/10ML IV SOLN
INTRAVENOUS | Status: AC
  Filled 2015-07-18: qty 1

## 2015-07-18 MED ORDER — FENTANYL CITRATE (PF) 100 MCG/2ML IJ SOLN
INTRAMUSCULAR | Status: DC | PRN
  Administered 2015-07-18 (×7): 50 ug via INTRAVENOUS

## 2015-07-18 MED ORDER — PROPOFOL 10 MG/ML IV BOLUS
INTRAVENOUS | Status: AC
  Filled 2015-07-18: qty 20

## 2015-07-18 MED ORDER — HYDROMORPHONE HCL 1 MG/ML IJ SOLN
0.2500 mg | INTRAMUSCULAR | Status: DC | PRN
  Administered 2015-07-18 (×3): 0.5 mg via INTRAVENOUS

## 2015-07-18 MED ORDER — DEXAMETHASONE SODIUM PHOSPHATE 10 MG/ML IJ SOLN
INTRAMUSCULAR | Status: DC | PRN
  Administered 2015-07-18: 10 mg via INTRAVENOUS

## 2015-07-18 MED ORDER — ACETAMINOPHEN 10 MG/ML IV SOLN
INTRAVENOUS | Status: AC
  Filled 2015-07-18: qty 100

## 2015-07-18 MED ORDER — CEFAZOLIN SODIUM-DEXTROSE 2-3 GM-% IV SOLR
2.0000 g | INTRAVENOUS | Status: AC
  Administered 2015-07-18: 2 g via INTRAVENOUS

## 2015-07-18 SURGICAL SUPPLY — 76 items
APPLIER CLIP ROT 10 11.4 M/L (STAPLE)
BLADE CLIPPER SURG (BLADE) ×4 IMPLANT
BLADE EXTENDED COATED 6.5IN (ELECTRODE) IMPLANT
CATH KIT ON-Q SILVERSOAK 5IN (CATHETERS) IMPLANT
CATH KIT ON-Q SILVERSOAK 7.5IN (CATHETERS) ×8 IMPLANT
CATH MALLECOT 28FR (CATHETERS) IMPLANT
CATH ROBINSON RED A/P 18FR (CATHETERS) ×4 IMPLANT
CHLORAPREP W/TINT 26ML (MISCELLANEOUS) ×4 IMPLANT
CLIP APPLIE ROT 10 11.4 M/L (STAPLE) IMPLANT
CLIP TI LARGE 6 (CLIP) ×8 IMPLANT
CLIP TI MEDIUM 6 (CLIP) ×4 IMPLANT
CLIP TI MEDIUM LARGE 6 (CLIP) IMPLANT
COVER MAYO STAND STRL (DRAPES) ×4 IMPLANT
COVER SURGICAL LIGHT HANDLE (MISCELLANEOUS) ×4 IMPLANT
DRAIN CHANNEL 19F RND (DRAIN) IMPLANT
DRAIN PENROSE 18X1/2 LTX STRL (DRAIN) ×4 IMPLANT
DRAPE LAPAROSCOPIC ABDOMINAL (DRAPES) ×4 IMPLANT
DRAPE UTILITY XL STRL (DRAPES) IMPLANT
DRAPE WARM FLUID 44X44 (DRAPE) ×8 IMPLANT
DRESSING TELFA ISLAND 4X8 (GAUZE/BANDAGES/DRESSINGS) ×4 IMPLANT
DRSG TEGADERM 2-3/8X2-3/4 SM (GAUZE/BANDAGES/DRESSINGS) ×4 IMPLANT
DRSG TELFA PLUS 4X6 ADH ISLAND (GAUZE/BANDAGES/DRESSINGS) IMPLANT
ELECT BLADE TIP CTD 4 INCH (ELECTRODE) ×4 IMPLANT
ELECT PENCIL ROCKER SW 15FT (MISCELLANEOUS) IMPLANT
ELECT REM PT RETURN 9FT ADLT (ELECTROSURGICAL) ×4
ELECTRODE REM PT RTRN 9FT ADLT (ELECTROSURGICAL) ×2 IMPLANT
EVACUATOR SILICONE 100CC (DRAIN) IMPLANT
GAUZE SPONGE 2X2 8PLY STRL LF (GAUZE/BANDAGES/DRESSINGS) ×2 IMPLANT
GAUZE SPONGE 4X4 12PLY STRL (GAUZE/BANDAGES/DRESSINGS) IMPLANT
GLOVE BIO SURGEON STRL SZ 6 (GLOVE) ×4 IMPLANT
GLOVE BIOGEL PI IND STRL 6.5 (GLOVE) ×2 IMPLANT
GLOVE BIOGEL PI INDICATOR 6.5 (GLOVE) ×2
GOWN STRL REUS W/TWL 2XL LVL3 (GOWN DISPOSABLE) ×4 IMPLANT
GOWN STRL REUS W/TWL XL LVL3 (GOWN DISPOSABLE) ×12 IMPLANT
KIT BASIN OR (CUSTOM PROCEDURE TRAY) ×4 IMPLANT
LIGASURE IMPACT 36 18CM CVD LR (INSTRUMENTS) IMPLANT
RELOAD PROXIMATE 75MM BLUE (ENDOMECHANICALS) ×8 IMPLANT
RELOAD PROXIMATE 75MM GREEN (ENDOMECHANICALS) ×4 IMPLANT
RELOAD STAPLER BLUE 60MM (STAPLE) IMPLANT
RELOAD STAPLER GREEN 60MM (STAPLE) ×6 IMPLANT
RELOAD STAPLER WHITE 60MM (STAPLE) IMPLANT
SCISSORS LAP 5X35 DISP (ENDOMECHANICALS) IMPLANT
SET IRRIG TUBING LAPAROSCOPIC (IRRIGATION / IRRIGATOR) IMPLANT
SHEARS HARMONIC ACE PLUS 36CM (ENDOMECHANICALS) IMPLANT
SLEEVE XCEL OPT CAN 5 100 (ENDOMECHANICALS) ×4 IMPLANT
SPONGE DRAIN TRACH 4X4 STRL 2S (GAUZE/BANDAGES/DRESSINGS) IMPLANT
SPONGE GAUZE 2X2 STER 10/PKG (GAUZE/BANDAGES/DRESSINGS) ×2
SPONGE LAP 18X18 X RAY DECT (DISPOSABLE) ×4 IMPLANT
STAPLE ECHEON FLEX 60 POW ENDO (STAPLE) ×4 IMPLANT
STAPLER PROXIMATE 75MM BLUE (STAPLE) ×4 IMPLANT
STAPLER RELOAD BLUE 60MM (STAPLE)
STAPLER RELOAD GREEN 60MM (STAPLE) ×12
STAPLER RELOAD WHITE 60MM (STAPLE)
STAPLER VISISTAT 35W (STAPLE) ×4 IMPLANT
SUCTION POOLE TIP (SUCTIONS) ×4 IMPLANT
SUT ETHILON 2 0 PS N (SUTURE) ×4 IMPLANT
SUT PDS AB 1 TP1 96 (SUTURE) ×8 IMPLANT
SUT PDS AB 2-0 CT2 27 (SUTURE) IMPLANT
SUT PDS AB 3-0 SH 27 (SUTURE) ×12 IMPLANT
SUT SILK 2 0 (SUTURE) ×4
SUT SILK 2 0 SH CR/8 (SUTURE) ×8 IMPLANT
SUT SILK 2 0SH CR/8 30 (SUTURE) ×4 IMPLANT
SUT SILK 2-0 18XBRD TIE 12 (SUTURE) ×2 IMPLANT
SUT SILK 2-0 30XBRD TIE 12 (SUTURE) ×2 IMPLANT
SUT SILK 3 0 (SUTURE)
SUT SILK 3 0 SH CR/8 (SUTURE) ×4 IMPLANT
SUT SILK 3-0 18XBRD TIE 12 (SUTURE) IMPLANT
TOWEL OR 17X26 10 PK STRL BLUE (TOWEL DISPOSABLE) ×8 IMPLANT
TOWEL OR NON WOVEN STRL DISP B (DISPOSABLE) ×4 IMPLANT
TRAY FOLEY W/METER SILVER 14FR (SET/KITS/TRAYS/PACK) IMPLANT
TRAY FOLEY W/METER SILVER 16FR (SET/KITS/TRAYS/PACK) ×4 IMPLANT
TRAY LAPAROSCOPIC (CUSTOM PROCEDURE TRAY) ×4 IMPLANT
TROCAR XCEL BLUNT TIP 100MML (ENDOMECHANICALS) IMPLANT
TUNNELER SHEATH ON-Q 16GX12 DP (PAIN MANAGEMENT) ×4 IMPLANT
YANKAUER SUCT BULB TIP 10FT TU (MISCELLANEOUS) ×12 IMPLANT
YANKAUER SUCT BULB TIP NO VENT (SUCTIONS) IMPLANT

## 2015-07-18 NOTE — Transfer of Care (Signed)
Immediate Anesthesia Transfer of Care Note  Patient: James [name]  Procedure(s) Performed: Procedure(s): LAPAROSCOPY DIAGNOSTIC (N/A) SUB TOTAL GASTRECTOMY (N/A) FEEDING TUBE PLACEMENT (N/A)  Patient Location: PACU  Anesthesia Type:General  Level of Consciousness: awake, alert  and oriented  Airway & Oxygen Therapy: Patient Spontanous Breathing and Patient connected to face mask oxygen  Post-op Assessment: Report given to RN and Post -op Vital signs reviewed and stable  Post vital signs: Reviewed and stable  Last Vitals:  Filed Vitals:   07/18/15 0703  BP: 119/71  Pulse: 92  Temp: 36.6 C  Resp: 18    Complications: No apparent anesthesia complications

## 2015-07-18 NOTE — Op Note (Signed)
PRE-OPERATIVE DIAGNOSIS: gastric cancer, cT3N0  POST-OPERATIVE DIAGNOSIS:  Same  PROCEDURE:  Procedure(s): Diagnostic laparoscopy, subtotal gastrectomy, feeding jejunostomy tube  SURGEON:  Surgeon(s): Stark Klein, MD  ASSISTANT(s):   Frederich Cha, MD Armandina Gemma, MD  ANESTHESIA:   general  DRAINS: Jejunostomy Tube   LOCAL MEDICATIONS USED:  BUPIVICAINE   SPECIMEN:  Source of Specimen:  proximal stomach  DISPOSITION OF SPECIMEN:  PATHOLOGY  COUNTS:  YES  DICTATION: .Dragon Dictation  PLAN OF CARE: Admit to inpatient   PATIENT DISPOSITION:  PACU - hemodynamically stable.  FINDINGS:  Baseball size tumor in proximal stomach on fundus  EBL: 400  PROCEDURE:  Patient was identified in the holding area and taken to the operating room where he was placed supine on the operating room table. General anesthesia was induced. His arms were tucked. Foley catheter was placed. His abdomen was then prepped and draped in sterile fashion. A timeout was performed. When all was correct, we continued.  He was placed into the reverse Trendelenburg position and rotated to the right. A 5 mm Optiview port was used to gain access to the abdomen under direct visualization. Pneumoperitoneum was achieved to a pressure of 15 mmHg. There was no evidence of carcinomatosis from the angle. A second 5 mm trocar was placed in the midline. There was again no evidence of carcinomatosis. The laparoscopic equipment was passed off. A midline incision was then made with a #10 blade from the xiphoid to the umbilicus. The subcutaneous tissues were divided with the cautery. The fascia was divided as well with the cautery and the peritoneum was entered sharply in the upper midline. This was opened the length of the skin incision. A Bookwalter retractor was placed for assistance with visualization.  The stomach and omentum were then taken off of the colon in order to open up the lesser sac. The harmonic scalpel and  cautery were used to achieve this. The short gastrics were then taken down with the harmonic scalpel. One of these had to be suture ligated. The left gastric pedicle was then skeletonized. The vein was tied and then divided. The artery was then tied, suture ligated, and then clipped. The mass was up in the fundus of the stomach.  The harmonic was then used to open up the omentum at the site of the gastroepiploic arteries in order to divide the stomach. Stay sutures were placed on the distal esophagus. The GIA-75 stapler was used to divide the stomach, creating a short sleeve. Proximally, the echelon stapler was used to divide around 1 cm distal to the GE junction. The specimen was opened and the proximal and distal margins were marked. This was sent for frozen section.  The esophagogastrostomy was created with the stapler and closed with interrupted 2-0 silk sutures. The distal stomach was close enough to lay over the top of the distal esophagus. The stapler was passed via an anterior gastrotomy and was laid on top of the distal esophagus. The staple line was then run with a second layer of 3-0 PDS. Prior to completely closing this down, the NG tube was passed into the stomach. The entire antrum and a portion of the body of the stomach was remaining.  A jejunostomy tube site was selected approximately 20 cm distal to the ligament of Treitz.  The 18 French red rubber catheter was advanced to the abdominal wall beyond the costal margin in the left upper quadrant. A appropriate site was identified in the jejunum in pursestring suture was placed  in this location.  The bowel was opened and the J-tube passed in the evening limb of the jejunum. The pursestring suture was tied down. The proximal tube was imbricated with 2-0 silk sutures. This was then pulled up to the abdominal wall and secured to the abdominal wall with 20 sutures. The entire Herbert Seta portion was secured to the abdominal wall as well with 2-0 silk  interrupted sutures. The abdomen was then copiously irrigated.  There was no evidence of bleeding. The frozen section margins returned back as negative. The On-Q tunnelers were placed in the preperitoneal space on either side of the fascial incision.  The fascia was then closed using running #1 looped PDS suture. The skin was then irrigated and closed with staples. The J-tube was secured with a 2-0 nylon.  The On-Q catheters were then advanced through the tunnelers and secured with Steri-Strips and Tegaderm. An island dressing was placed on the midline incision. A 2 x 2 and Tegaderm were placed on the left upper quadrant Optiview site.  The patient was allowed to emerge from anesthesia and extubated. He was taken to the PACU in stable condition. Needle, sponge, and instrument counts are correct 2.

## 2015-07-18 NOTE — Anesthesia Postprocedure Evaluation (Signed)
  Anesthesia Post-op Note  Patient: James [name]  Procedure(s) Performed: Procedure(s) (LRB): LAPAROSCOPY DIAGNOSTIC (N/A) SUB TOTAL GASTRECTOMY (N/A) FEEDING TUBE PLACEMENT (N/A)  Patient Location: PACU  Anesthesia Type: General  Level of Consciousness: awake and alert   Airway and Oxygen Therapy: Patient Spontanous Breathing  Post-op Pain: mild  Post-op Assessment: Post-op Vital signs reviewed, Patient's Cardiovascular Status Stable, Respiratory Function Stable, Patent Airway and No signs of Nausea or vomiting  Last Vitals:  Filed Vitals:   07/18/15 1352  BP: 118/76  Pulse: 83  Temp:   Resp: 15    Post-op Vital Signs: stable   Complications: No apparent anesthesia complications

## 2015-07-18 NOTE — H&P (Signed)
[name] Location: [facility] Patient #: [id number] DOB: [birth year] Undefined / Language: Cleophus Molt / Race: White Male  History of Present Illness The patient is a 69 year old male who presents with gastric cancer. Patient is a 69 year old male who presents with a new diagnosis of gastric cancer. He saw Dr. Linna Darner for fatigue and dysphagia. He has recently also been anemic. He denies any bloody stools. He mainly has dysphagia with solids but occasionally liquids. He denies weight loss. He does have a history of prostate cancer and has just finished brachytherapy for that. He did have a colonoscopy around 3 years ago with several tubular adenomas removed. He has still been able to play tennis and some cough, but has not been able to play as long as normal. He underwent a EGD with Dr. Arelia Longest which demonstrated a large 6.5 cm mass in the gastric cardia/fundus. 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 He did not have a tubular adenoma and his stomach as well that was removed. He was started on vitamin B-12 injections. He has subsequently undergone a CT of the abdomen and pelvis which was essentially negative for metastatic disease.    pathology Diagnosis 1. Stomach- Cardia, cardia INVASIVE ADENOCARCINOMA ULCER, NECROSIS AND FUNGAL INFECTION 2. Stomach, biopsy, gastric body TUBULAR ADENOMA    IMPRESSION: 1. 6.4 x 6.3 x 6.2 cm gastric neoplasm which appears to arise immediately distal to the gastroesophageal junction. No definite signs of metastatic disease in the chest, abdomen or pelvis on today's examination. 2. 1.6 cm well-defined low-attenuation lesion in segment 7 of the liver is compatible with a simple cyst. There are several tiny sub cm low-attenuation lesions which are too small to definitively characterize the appearance the small lesions suggests tiny cysts, but attention on follow-up studies to  ensure stability is recommended. 3. Atherosclerosis, including 3 vessel coronary artery disease. Please note that although the presence of coronary artery calcium documents the presence of coronary artery disease, the severity of this disease and any potential stenosis cannot be assessed on this non-gated CT examination. Assessment for potential risk factor modification, dietary therapy or pharmacologic therapy may be warranted, if clinically indicated. 4. There are calcifications of the mitral annulus. Echocardiographic correlation for evaluation of potential valvular dysfunction may be warranted if clinically indicated. 5. A few scattered tiny pulmonary nodules measuring 3 mm or less in size are highly nonspecific, and favored to be benign, but attention on followup studies is recommended.   Problem List/Past Medical PROSTATE CANCER (C61) s/p brachytherapy radiation. CARDIAC MURMUR (R01.1) MILD INTERMITTENT ASTHMA WITHOUT COMPLICATION (E45.40) GASTROESOPHAGEAL REFLUX DISEASE WITHOUT ESOPHAGITIS (K21.9)  Past Surgical History Rotator Cuff Repair - Right [place] Arthroscopic Knee Surgery - Right [place] [facility] Colon Polyp Removal - Colonoscopy Dr Sharlett Iles, colonoscopy  Allergies  Codeine Sulfate *ANALGESICS - OPIOID* Nausea.  Medication History  Medications Reconciled Maalox Advanced (200-200-20MG/5ML Suspension, Oral as needed) Active. Cyanocobalamin (100MCG/ML Solution, Injection as directed) Active. Efudex (5% Cream, External as directed) Active. Multivitamin (Oral daily) Active. Pravastatin Sodium (40MG Tablet, Oral daily) Active. Ranitidine HCl (150MG Tablet, Oral prn) Active. Cholecalciferol (1000UNIT Tablet, Oral daily) Active. 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 Diabetes Mellitus Father. Myocardial Infarction  Father. Cirrhosis Of Liver Mother.  Review of Systems  All other systems negative  Note: positive for  fatigue, reflux, vomiting. mild abdominal tenderness.  otherwise negative x 11 systems.    Vitals Wt Readings from Last 3 Encounters:  07/18/15 85.276 kg (188 lb)  07/13/15 85.276 kg (188 lb)  07/09/15 85.276 kg (188 lb)   Temp Readings from Last 3 Encounters:  07/18/15 97.9 F (36.6 C) Oral  07/13/15 98.5 F (36.9 C) Oral  07/09/15 99.3 F (37.4 C) Oral   BP Readings from Last 3 Encounters:  07/18/15 119/71  07/13/15 127/68  07/09/15 112/70   Pulse Readings from Last 3 Encounters:  07/18/15 92  07/13/15 91  07/09/15 95    Physical Exam  General Mental Status-Alert. General Appearance-Consistent with stated age. Hydration-Well hydrated. Voice-Normal.  Head and Neck Head-normocephalic, atraumatic with no lesions or palpable masses. Trachea-midline. Thyroid Gland Characteristics - normal size and consistency.  Eye Eyeball - Bilateral-Extraocular movements intact. Sclera/Conjunctiva - Bilateral-No scleral icterus.  Chest and Lung Exam Chest and lung exam reveals -quiet, even and easy respiratory effort with no use of accessory muscles and on auscultation, normal breath sounds, no adventitious sounds and normal vocal resonance. Inspection Chest Wall - Normal. Back - normal.  Cardiovascular Cardiovascular examination reveals -normal heart sounds, regular rate and rhythm with no murmurs and normal pedal pulses bilaterally.  Abdomen Inspection Inspection of the abdomen reveals - No Hernias. Palpation/Percussion Palpation and Percussion of the abdomen reveal - Soft, Non Tender, No Rebound tenderness, No Rigidity (guarding) and No hepatosplenomegaly. Auscultation Auscultation of the abdomen reveals - Bowel sounds normal.  Neurologic Neurologic evaluation reveals -alert and oriented x 3 with no impairment of recent or remote  memory. Mental Status-Normal.  Musculoskeletal Global Assessment -Note: no gross deformities.  Normal Exam - Left-Upper Extremity Strength Normal and Lower Extremity Strength Normal. Normal Exam - Right-Upper Extremity Strength Normal and Lower Extremity Strength Normal.  Lymphatic Head & Neck  General Head & Neck Lymphatics: Bilateral - Description - Normal. Axillary  General Axillary Region: Bilateral - Description - Normal. Tenderness - Non Tender. Femoral & Inguinal  Generalized Femoral & Inguinal Lymphatics: Bilateral - Description - No Generalized lymphadenopathy.    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 We will plan to get an endoscopic ultrasound. As long as there are no surprises, I will plan to do a diagnostic laparoscopy with plans for a partial gastrectomy. I would also place a feeding tube. I discussed the procedure with the patient and his wife. I reviewed the rationale for diagnostic laparoscopy and aborting the procedure if metastatic disease is found. I also discussed the reasoning to place a feeding tube. Especially with proximal gastrostomies, there are higher risks of leak. Patients also have significant difficulty with delayed gastric emptying.  The patient has a good clinical shape with the ability to play tennis and golf. I think he would do well with surgery.  I will also refer the patient to genetics since he has two adenocarcinomas.  I discussed the risks of surgery including bleeding, infection, damage to adjacent structures, possible heart or lung complications, possible wound issues, possible urinary retention and need for prolonged catheterization, possible death, possible difficulty with prolonged nausea and vomiting, weight loss and other unforeseen complications.  45 min spent in evaluation, examination, counseling, and coordination of care. Current  Plans  Referred to Genetic Counseling, for evaluation and follow up (Medical Genetics). You are being scheduled for surgery - Our schedulers will call you.  You should hear from our  office's scheduling department within 5 working days about the location, date, and time of surgery. We try to make accommodations for patient's preferences in scheduling surgery, but sometimes the OR schedule or the surgeon's schedule prevents [place] from making those accommodations.  If you have not heard from our office [phone]) in 5 working days, call the office and ask for your surgeon's nurse.  If you have other questions about your diagnosis, plan, or surgery, call the office and ask for your surgeon's nurse. Pt Education - Stomach Cancer: discussed with patient and provided information.   Signed by Stark Klein, MD

## 2015-07-18 NOTE — Anesthesia Procedure Notes (Signed)
Procedure Name: Intubation Date/Time: 07/18/2015 9:06 AM Performed by: Noralyn Pick D Pre-anesthesia Checklist: Patient identified, Emergency Drugs available, Suction available and Patient being monitored Patient Re-evaluated:Patient Re-evaluated prior to inductionOxygen Delivery Method: Circle System Utilized Preoxygenation: Pre-oxygenation with 100% oxygen Intubation Type: IV induction Ventilation: Mask ventilation without difficulty Laryngoscope Size: Mac and 4 Grade View: Grade I Tube type: Oral Tube size: 7.5 mm Number of attempts: 1 Airway Equipment and Method: Stylet and Oral airway Placement Confirmation: ETT inserted through vocal cords under direct vision,  positive ETCO2 and breath sounds checked- equal and bilateral Secured at: 21 cm Tube secured with: Tape Dental Injury: Teeth and Oropharynx as per pre-operative assessment

## 2015-07-18 NOTE — Progress Notes (Addendum)
Title of Study: PATHOLOGY PROCUREMENT  Description: Customer service manager for the Discovery and Validation of Biomarkers for the Prediction, Diagnosis and Management of Disease  Principal Investigator: Enid Cutter, MD  Study Coordinator: Burnis Kingfisher, Rodena Goldmann  IRB #: [phone]  Met with Mr. Dupee for 10 minutes to review IRB# [id number]. No family member present at the time. The patient is eligible and qualifies for this study. Reviewed the consent/HIPAA form in detail and explained the purpose of the study, study procedures, potential risks, potential benefits, and alternatives to participation. All of the patient's questions were answered. The patient agreed to take part in the study and signed the consent/HIPAA document. Enrollment procedures completed. The patient was given a copy of the signed informed consent. Consent form to be loaded in Media Tab (07/13/15 Procedure Note - DonorConsent.pdf)   Sample summary:  Patient declines blood donations. No blood obtained. Requested surgical specimen(s) to be sent to histology lab fresh or on saline, no formalin to be used.   Burnis Kingfisher / Rodena Goldmann  Pathology Clinical Research  Cell [phone]

## 2015-07-18 NOTE — Interval H&P Note (Signed)
History and Physical Interval Note:  07/18/2015 8:43 AM  James [name]  has presented today for surgery, with the diagnosis of MALIGNANT NEOPLASM OF FUNDUS OF STOMACH  The various methods of treatment have been discussed with the patient and family. After consideration of risks, benefits and other options for treatment, the patient has consented to  Procedure(s): LAPAROSCOPY DIAGNOSTIC (N/A) PROXIMAL VS TOTAL GASTRECTOMY (N/A) FEEDING TUBE PLACEMENT (N/A) as a surgical intervention .  The patient's history has been reviewed, patient examined, no change in status, stable for surgery.  I have reviewed the patient's chart and labs.  Questions were answered to the patient's satisfaction.     James [name]

## 2015-07-18 NOTE — Anesthesia Preprocedure Evaluation (Addendum)
Anesthesia Evaluation  Patient identified by MRN, date of birth, ID band Patient awake    Reviewed: Allergy & Precautions, H&P , NPO status , Patient's Chart, lab work & pertinent test results  History of Anesthesia Complications Negative for: history of anesthetic complications  Airway Mallampati: II  TM Distance: >3 FB Neck ROM: full    Dental no notable dental hx. (+) Dental Advisory Given, Caps,    Pulmonary asthma , former smoker,    Pulmonary exam normal breath sounds clear to auscultation       Cardiovascular Exercise Tolerance: Good (-) hypertension(-) angina(-) Past MI negative cardio ROS Normal cardiovascular exam Rhythm:regular Rate:Normal     Neuro/Psych negative neurological ROS  negative psych ROS   GI/Hepatic Neg liver ROS, PUD, GERD  Medicated and Controlled,Stomach cancer    Endo/Other  negative endocrine ROS  Renal/GU negative Renal ROS  negative genitourinary   Musculoskeletal negative musculoskeletal ROS (+)   Abdominal   Peds  Hematology  (+) Blood dyscrasia, anemia ,   Anesthesia Other Findings   Reproductive/Obstetrics                            Anesthesia Physical Anesthesia Plan  ASA: II  Anesthesia Plan: General   Post-op Pain Management:    Induction: Intravenous  Airway Management Planned: Oral ETT  Additional Equipment:   Intra-op Plan:   Post-operative Plan: Extubation in OR  Informed Consent: I have reviewed the patients History and Physical, chart, labs and discussed the procedure including the risks, benefits and alternatives for the proposed anesthesia with the patient or authorized representative who has indicated his/her understanding and acceptance.   Dental advisory given  Plan Discussed with: CRNA  Anesthesia Plan Comments: (Risks/benefits of general anesthesia discussed with patient including risk of damage to teeth, lips, gum,  and tongue, nausea/vomiting, allergic reactions to medications, and the possibility of heart attack, stroke and death.  All patient questions answered.  Patient wishes to proceed.)        Anesthesia Quick Evaluation

## 2015-07-19 ENCOUNTER — Encounter: Payer: Self-pay | Admitting: Internal Medicine

## 2015-07-19 ENCOUNTER — Encounter: Payer: Self-pay | Admitting: *Deleted

## 2015-07-19 LAB — CBC
HCT: 27.7 % — ABNORMAL LOW (ref 39.0–52.0)
Hemoglobin: 8.3 g/dL — ABNORMAL LOW (ref 13.0–17.0)
MCH: 22.3 pg — AB (ref 26.0–34.0)
MCHC: 30 g/dL (ref 30.0–36.0)
MCV: 74.5 fL — ABNORMAL LOW (ref 78.0–100.0)
PLATELETS: 295 10*3/uL (ref 150–400)
RBC: 3.72 MIL/uL — AB (ref 4.22–5.81)
RDW: 16 % — AB (ref 11.5–15.5)
WBC: 12 10*3/uL — AB (ref 4.0–10.5)

## 2015-07-19 LAB — BASIC METABOLIC PANEL
Anion gap: 6 (ref 5–15)
BUN: 13 mg/dL (ref 6–20)
CALCIUM: 8.4 mg/dL — AB (ref 8.9–10.3)
CO2: 26 mmol/L (ref 22–32)
Chloride: 103 mmol/L (ref 101–111)
Creatinine, Ser: 0.59 mg/dL — ABNORMAL LOW (ref 0.61–1.24)
GFR calc Af Amer: >60 mL/min (ref >60)
Glucose, Bld: 188 mg/dL — ABNORMAL HIGH (ref 65–99)
POTASSIUM: 4.9 mmol/L (ref 3.5–5.1)
SODIUM: 135 mmol/L (ref 135–145)

## 2015-07-19 LAB — PROTIME-INR
INR: 1.21 (ref 0.00–1.49)
PROTHROMBIN TIME: 15.5 s — AB (ref 11.6–15.2)

## 2015-07-19 LAB — GLUCOSE, CAPILLARY: Glucose-Capillary: 153 mg/dL — ABNORMAL HIGH (ref 65–99)

## 2015-07-19 MED ORDER — JEVITY 1.2 CAL PO LIQD: 1000.0000 mL | ORAL | Status: DC

## 2015-07-19 MED ORDER — OSMOLITE 1.5 CAL PO LIQD
1000.0000 mL | ORAL | Status: DC
  Administered 2015-07-19: 1000 mL
  Filled 2015-07-19 (×2): qty 1000

## 2015-07-19 NOTE — Progress Notes (Signed)
Oncology Nurse Navigator Documentation  Oncology Nurse Navigator Flowsheets 07/19/2015  Referral date to RadOnc/MedOnc -  Navigator Encounter Type Hospital visit  Patient Visit Type Inpatient  Treatment Phase S/P subtotal gastrectomy  Barriers/Navigation Needs Family concerns-wife concerned about tube feedings and how to care for him at home  Education Briefly reviewed the tube feeding titration orders and free water flushes. Made her aware that nursing will instruct her in this prior to discharge and home health will also assist her at home until she is comfortable.  Interventions Encouraged patient to try to increase activity as nursing instructs him-will make for quicker recovery Supportive listening for wife  Coordination of Care -  Education Method -  Support Groups/Services -  Time Spent with Patient 15  Will follow up at office visit on 10/31.

## 2015-07-19 NOTE — Care Management Note (Signed)
Case Management Note  Patient Details  Name: ARES CARDOZO MRN: [id number] Date of Birth: [birth year]  Subjective/Objective:       Diagnostic laparoscopy, subtotal gastrectomy, feeding jejunostomy tube             Action/Plan: Discharge planning, spoke with patient and spouse at bedside. Patient did not want to participate in discussion and deferred to spouse. Discussed need for [facility] services and what services provide. Patient has a long term care policy that will cover PCS. Provided list for [facility] choice and private duty list for PCS. Wife would like to use [facility], wants to talk with family before making a final decision. Will follow up tomorrow to confirm. Notified [facility] of potential referral.    Expected Discharge Date:                  Expected Discharge Plan:  [place]  In-House Referral:  NA  Discharge planning Services  CM Consult  Post Acute Care Choice:  Home Health Choice offered to:  Spouse  DME Arranged:  N/A DME Agency:  NA  HH Arranged:  RN, PT, Disease Management [place] Agency:  [place]  Status of Service:  In process, will continue to follow  Medicare Important Message Given:    Date Medicare IM Given:    Medicare IM give by:    Date Additional Medicare IM Given:    Additional Medicare Important Message give by:     If discussed at [facility] of Stay Meetings, dates discussed:    Additional Comments:  Guadalupe Maple, RN 07/19/2015, 11:40 AM

## 2015-07-19 NOTE — Progress Notes (Signed)
1 Day Post-Op  Subjective: Patient pain reasonably well controlled.    Objective: Vital signs in last 24 hours: Temp:  [97.4 F (36.3 C)-98.3 F (36.8 C)] 98 F (36.7 C) (10/12 1715) Pulse Rate:  [69-92] 69 (10/12 2154) Resp:  [11-20] 17 (10/12 2302) BP: (118-145)/(56-79) 123/75 mmHg (10/12 2154) SpO2:  [95 %-100 %] 98 % (10/12 2302) FiO2 (%):  [41 %] 41 % (10/12 1700) Weight:  [85.276 kg (188 lb)] 85.276 kg (188 lb) (10/12 0734)    Intake/Output from previous day: 10/12 0701 - 10/13 0700 In: 3300 [I.V.:3300] Out: 1100 [Urine:650; Blood:450] Intake/Output this shift: Total I/O In: -  Out: 400 [Urine:400]  General appearance: sleeping, no distress Resp: breathing comfortably GI: soft, non distended.  appropriately tender Extremities: extremities normal, atraumatic, no cyanosis or edema and SCDs in place  Lab Results:   Recent Labs  07/18/15 1714  WBC 12.4*  HGB 8.7*  HCT 29.2*  PLT 309   BMET  Recent Labs  07/18/15 1714  CREATININE 0.69   PT/INR No results for input(s): LABPROT, INR in the last 72 hours. ABG No results for input(s): PHART, HCO3 in the last 72 hours.  Invalid input(s): PCO2, PO2  Studies/Results: No results found.  Anti-infectives: Anti-infectives    Start     Dose/Rate Route Frequency Ordered Stop   07/18/15 1700  ceFAZolin (ANCEF) IVPB 2 g/50 mL premix     2 g 100 mL/hr over 30 Minutes Intravenous 3 times per day 07/18/15 1534 07/18/15 1728   07/18/15 0714  ceFAZolin (ANCEF) IVPB 2 g/50 mL premix     2 g 100 mL/hr over 30 Minutes Intravenous On call to O.R. 07/18/15 0714 07/18/15 0908      Assessment/Plan: s/p Procedure(s): LAPAROSCOPY DIAGNOSTIC (N/A) SUB TOTAL GASTRECTOMY (N/A) FEEDING TUBE PLACEMENT (N/A)   PAS Leave foley until POD 2 for urinary output monitoring.  NPO/NGT Nutrition consult. Start trickle tube feeds via J tube today. IV Tylenol, OnQ, PCA for pain control Lovenox for DVT prophylaxis Protonix for  GI prophylaxis and treatment.   Plan removal of NGT tomorrow as long as patient is doing OK   LOS: 1 day    Kemper Heupel 07/19/2015

## 2015-07-19 NOTE — Progress Notes (Signed)
Initial Nutrition Assessment  INTERVENTION:   Initiate trickle feeds of Osmolite 1.5 @ 20 ml/hr via j-tube. When medically able to advance, recommend increase by 10 ml every 4 hours to goal rate of 40 ml/hr.  Recommend water flushes of 240 ml QID.   Tube feeding regimen provides 1440 kcal (67% of needs), 60 grams of protein (60% of needs), and 1692 ml of H2O.   RD to continue to monitor daily for tolerance and advancement  NUTRITION DIAGNOSIS:   Inadequate oral intake related to inability to eat, dysphagia, cancer and cancer related treatments as evidenced by other (see comment) (poor PO intake PTA).  GOAL:   Patient will meet greater than or equal to 90% of their needs  MONITOR:   Labs, Weight trends, TF tolerance, Skin, I & O's  REASON FOR ASSESSMENT:   Consult Enteral/tube feeding initiation and management  ASSESSMENT:   Patient is a 69 year old male who presents with a new diagnosis of gastric cancer.   S/p 10/12 Procedure(s): LAPAROSCOPY DIAGNOSTIC (N/A) PROXIMAL VS TOTAL GASTRECTOMY (N/A) FEEDING TUBE PLACEMENT (N/A)  Pt in room with wife at bedside. Pt to start tube feeds via j-tube today with trickle feeds of Osmolite 1.5. Per pt's wife, she would encourage him to eat at least 4 small frequent meals, emphasizing protein. Pt continued to lose weight over the last 6 months (18 lb, 9% weight loss x 6 months, which is insignificant for time frame).  Per Dr. Barry Dienes consult, this is to be supplemental feeds and the goal is for TF to provide 50-70% of his estimated needs.   Patient shows no sign of depletion of muscle mass or body fat.  Labs reviewed: Low Creatinine Glucose 168  Diet Order:  Diet NPO time specified  Skin:  Reviewed, no issues  Last BM:  PTA  Height:   Ht Readings from Last 1 Encounters:  07/18/15 5' 6.75" (1.695 m)    Weight:   Wt Readings from Last 1 Encounters:  07/18/15 188 lb (85.276 kg)    Ideal Body Weight:  67.3 kg  BMI:   Body mass index is 29.68 kg/(m^2).  Estimated Nutritional Needs:   Kcal:  2150-2350  Protein:  100-110g  Fluid:  2.2L/day  EDUCATION NEEDS:   Education needs addressed  Clayton Bibles, MS, RD, LDN Pager: [phone] After Hours Pager: [phone]

## 2015-07-20 ENCOUNTER — Ambulatory Visit: Payer: Medicare Other | Admitting: Oncology

## 2015-07-20 LAB — BASIC METABOLIC PANEL
Anion gap: 4 — ABNORMAL LOW (ref 5–15)
BUN: 8 mg/dL (ref 6–20)
CALCIUM: 8.3 mg/dL — AB (ref 8.9–10.3)
CHLORIDE: 102 mmol/L (ref 101–111)
CO2: 30 mmol/L (ref 22–32)
CREATININE: 0.56 mg/dL — AB (ref 0.61–1.24)
GFR calc non Af Amer: >60 mL/min (ref >60)
GLUCOSE: 147 mg/dL — AB (ref 65–99)
Potassium: 4.9 mmol/L (ref 3.5–5.1)
Sodium: 136 mmol/L (ref 135–145)

## 2015-07-20 LAB — CBC
HEMATOCRIT: 25.4 % — AB (ref 39.0–52.0)
HEMOGLOBIN: 7.6 g/dL — AB (ref 13.0–17.0)
MCH: 22.5 pg — AB (ref 26.0–34.0)
MCHC: 29.9 g/dL — AB (ref 30.0–36.0)
MCV: 75.1 fL — AB (ref 78.0–100.0)
Platelets: 239 10*3/uL (ref 150–400)
RBC: 3.38 MIL/uL — ABNORMAL LOW (ref 4.22–5.81)
RDW: 16.3 % — AB (ref 11.5–15.5)
WBC: 11.3 10*3/uL — ABNORMAL HIGH (ref 4.0–10.5)

## 2015-07-20 MED ORDER — OSMOLITE 1.5 CAL PO LIQD
1000.0000 mL | ORAL | Status: DC
  Administered 2015-07-21: 1000 mL
  Filled 2015-07-20: qty 1000

## 2015-07-20 MED ORDER — OSMOLITE 1.5 CAL PO LIQD
1000.0000 mL | ORAL | Status: DC
  Administered 2015-07-20: 1000 mL
  Filled 2015-07-20: qty 1000

## 2015-07-20 MED ORDER — OXYCODONE HCL 5 MG/5ML PO SOLN
10.0000 mg | Freq: Four times a day (QID) | ORAL | Status: DC | PRN
  Administered 2015-07-23 – 2015-07-24 (×4): 10 mg via ORAL
  Filled 2015-07-20 (×7): qty 10

## 2015-07-20 MED ORDER — FREE WATER
240.0000 mL | Freq: Four times a day (QID) | Status: DC
  Administered 2015-07-20 – 2015-07-24 (×13): 240 mL

## 2015-07-20 NOTE — Progress Notes (Signed)
Patient ID: James [name], male   DOB: June 02, 1946, 69 y.o.   MRN: [id number] 2 Days Post-Op  Subjective: Pain controlled.  No n/v.    Objective: Vital signs in last 24 hours: Temp:  [97.5 F (36.4 C)-98.3 F (36.8 C)] 98 F (36.7 C) (10/14 0621) Pulse Rate:  [75-105] 90 (10/14 0621) Resp:  [12-76] 18 (10/14 0621) BP: (132-157)/(64-87) 132/74 mmHg (10/14 0621) SpO2:  [2 %-100 %] 100 % (10/14 0621) FiO2 (%):  [41 %] 41 % (10/13 1549) Weight:  [85 kg (187 lb 6.3 oz)] 85 kg (187 lb 6.3 oz) (10/13 1558)    Intake/Output from previous day: 10/13 0701 - 10/14 0700 In: 2365.6 [I.V.:2285.6; NG/GT:80] Out: 3115 [Urine:2700; Emesis/NG output:415] Intake/Output this shift:    General appearance: sleeping, no distress Resp: breathing comfortably GI: soft, non distended.  appropriately tender Extremities: extremities normal, atraumatic, no cyanosis or edema and SCDs in place  Lab Results:   Recent Labs  07/19/15 0705 07/20/15 0528  WBC 12.0* 11.3*  HGB 8.3* 7.6*  HCT 27.7* 25.4*  PLT 295 239   BMET  Recent Labs  07/19/15 0705 07/20/15 0528  NA 135 136  K 4.9 4.9  CL 103 102  CO2 26 30  GLUCOSE 188* 147*  BUN 13 8  CREATININE 0.59* 0.56*  CALCIUM 8.4* 8.3*   PT/INR  Recent Labs  07/19/15 0705  LABPROT 15.5*  INR 1.21   ABG No results for input(s): PHART, HCO3 in the last 72 hours.  Invalid input(s): PCO2, PO2  Studies/Results: No results found.  Anti-infectives: Anti-infectives    Start     Dose/Rate Route Frequency Ordered Stop   07/18/15 1700  ceFAZolin (ANCEF) IVPB 2 g/50 mL premix     2 g 100 mL/hr over 30 Minutes Intravenous 3 times per day 07/18/15 1534 07/18/15 1728   07/18/15 0714  ceFAZolin (ANCEF) IVPB 2 g/50 mL premix     2 g 100 mL/hr over 30 Minutes Intravenous On call to O.R. 07/18/15 0714 07/18/15 0908      Assessment/Plan: s/p Procedure(s): LAPAROSCOPY DIAGNOSTIC (N/A) SUB TOTAL GASTRECTOMY (N/A) FEEDING TUBE PLACEMENT (N/A)    PAS D/c foley  D/c ngt Nutrition consult. Tube feeds.   OnQ, d/c pca.  Lovenox for DVT prophylaxis Protonix for GI prophylaxis and treatment.      LOS: 2 days    [facility] 07/20/2015

## 2015-07-20 NOTE — Care Management Important Message (Signed)
Important Message  Patient Details  Name: PERRIN GENS MRN: [id number] Date of Birth: August 15, 1946   Medicare Important Message Given:  [facility] notification given    Camillo Flaming 07/20/2015, 1:19 [place] Message  Patient Details  Name: COLLIER BOHNET MRN: [id number] Date of Birth: May 25, 1946   Medicare Important Message Given:  Yes-second notification given    Camillo Flaming 07/20/2015, 1:19 PM

## 2015-07-20 NOTE — Progress Notes (Signed)
Nutrition Follow-up  DOCUMENTATION CODES:   Not applicable  INTERVENTION:  Receiving Osmolite 1.5 @ 20 ml/hr with goal rate of 28m/hr. Advance rate per MD orders.   NUTRITION DIAGNOSIS:   Inadequate oral intake related to inability to eat, dysphagia, cancer and cancer related treatments as evidenced by other (see comment) (poor PO intake PTA).  Ongoing, pt is receiving tube feed now.  GOAL:   Patient will meet greater than or equal to 90% of their needs  Attempting to meet 70% of pt needs with tube feed, not met yet.  MONITOR:   Labs, Weight trends, TF tolerance, Skin, I & O's  REASON FOR ASSESSMENT:   Consult Enteral/tube feeding initiation and management  ASSESSMENT:   69year old male who presents with gastric cancer. Patient is a 69year old male who presents with a new diagnosis of gastric cancer.   S/P gastrectomy & J-tube placement, pt is receiving Osm 1.5 @ 20, has not been advanced. Recommend Mg, P labs with tube feeding advancement to monitor for refeeding. Pt is tolerating tubefeed well.  Monitor for advancement and tolerance, as well as refeeding.  Diet Order:     Skin:  Reviewed, no issues  Last BM:  PTA  Height:   Ht Readings from Last 1 Encounters:  07/18/15 5' 6.75" (1.695 m)    Weight:   Wt Readings from Last 1 Encounters:  07/19/15 187 lb 6.3 oz (85 kg)    Ideal Body Weight:  67.3 kg  BMI:  Body mass index is 29.59 kg/(m^2).  Estimated Nutritional Needs:   Kcal:  2150-2350  Protein:  100-110g  Fluid:  2.2L/day  EDUCATION NEEDS:   Education needs addressed  WSatira Anis Airika Alkhatib, MS, RD LDN After Hours/Weekend Pager [phone]

## 2015-07-21 LAB — BASIC METABOLIC PANEL
Anion gap: 6 (ref 5–15)
BUN: 7 mg/dL (ref 6–20)
CHLORIDE: 100 mmol/L — AB (ref 101–111)
CO2: 29 mmol/L (ref 22–32)
Calcium: 8.1 mg/dL — ABNORMAL LOW (ref 8.9–10.3)
Creatinine, Ser: 0.62 mg/dL (ref 0.61–1.24)
GFR calc Af Amer: >60 mL/min (ref >60)
GFR calc non Af Amer: >60 mL/min (ref >60)
Glucose, Bld: 131 mg/dL — ABNORMAL HIGH (ref 65–99)
POTASSIUM: 3.9 mmol/L (ref 3.5–5.1)
SODIUM: 135 mmol/L (ref 135–145)

## 2015-07-21 LAB — CBC
HCT: 24 % — ABNORMAL LOW (ref 39.0–52.0)
HEMOGLOBIN: 7 g/dL — AB (ref 13.0–17.0)
MCH: 21.4 pg — AB (ref 26.0–34.0)
MCHC: 29.2 g/dL — ABNORMAL LOW (ref 30.0–36.0)
MCV: 73.4 fL — ABNORMAL LOW (ref 78.0–100.0)
Platelets: 265 10*3/uL (ref 150–400)
RBC: 3.27 MIL/uL — AB (ref 4.22–5.81)
RDW: 16.5 % — ABNORMAL HIGH (ref 11.5–15.5)
WBC: 10 10*3/uL (ref 4.0–10.5)

## 2015-07-21 LAB — GLUCOSE, CAPILLARY
GLUCOSE-CAPILLARY: 145 mg/dL — AB (ref 65–99)
GLUCOSE-CAPILLARY: 147 mg/dL — AB (ref 65–99)
GLUCOSE-CAPILLARY: 198 mg/dL — AB (ref 65–99)
Glucose-Capillary: 124 mg/dL — ABNORMAL HIGH (ref 65–99)
Glucose-Capillary: 148 mg/dL — ABNORMAL HIGH (ref 65–99)
Glucose-Capillary: 189 mg/dL — ABNORMAL HIGH (ref 65–99)
Glucose-Capillary: 137 mg/dL — ABNORMAL HIGH (ref 65–99)

## 2015-07-21 MED ORDER — POTASSIUM CHLORIDE IN NACL 20-0.45 MEQ/L-% IV SOLN
INTRAVENOUS | Status: DC
  Administered 2015-07-21 – 2015-07-22 (×3): via INTRAVENOUS
  Administered 2015-07-24: 1000 mL via INTRAVENOUS
  Filled 2015-07-21 (×7): qty 1000

## 2015-07-21 NOTE — Progress Notes (Signed)
3 Days Post-Op  Subjective: No flatus or BM.  Walking.  Objective: Vital signs in last 24 hours: Temp:  [98.3 F (36.8 C)-99 F (37.2 C)] 99 F (37.2 C) (10/15 0437) Pulse Rate:  [85-103] 85 (10/15 0437) Resp:  [18] 18 (10/15 0437) BP: (118-149)/(62-72) 124/69 mmHg (10/15 0437) SpO2:  [94 %-97 %] 96 % (10/15 0437) Weight:  [79.7 kg (175 lb 11.3 oz)] 79.7 kg (175 lb 11.3 oz) (10/15 0437) Last BM Date: 07/19/15  Intake/Output from previous day: 10/14 0701 - 10/15 0700 In: 2271.8 [P.O.:15; I.V.:840.8; NG/GT:1416] Out: 2400 [Urine:2400] Intake/Output this shift: Total I/O In: 350 [NG/GT:350] Out: -   PE: General- In NAD Abdomen-soft, incision clean and intact, occasional bowel sound  Lab Results:   Recent Labs  07/20/15 0528 07/21/15 0518  WBC 11.3* 10.0  HGB 7.6* 7.0*  HCT 25.4* 24.0*  PLT 239 265   BMET  Recent Labs  07/20/15 0528 07/21/15 0518  NA 136 135  K 4.9 3.9  CL 102 100*  CO2 30 29  GLUCOSE 147* 131*  BUN 8 7  CREATININE 0.56* 0.62  CALCIUM 8.3* 8.1*   PT/INR  Recent Labs  07/19/15 0705  LABPROT 15.5*  INR 1.21   Comprehensive Metabolic Panel:    Component Value Date/Time   NA 135 07/21/2015 0518   NA 136 07/20/2015 0528   NA 138 04/03/2015   K 3.9 07/21/2015 0518   K 4.9 07/20/2015 0528   CL 100* 07/21/2015 0518   CL 102 07/20/2015 0528   CO2 29 07/21/2015 0518   CO2 30 07/20/2015 0528   BUN 7 07/21/2015 0518   BUN 8 07/20/2015 0528   CREATININE 0.62 07/21/2015 0518   CREATININE 0.56* 07/20/2015 0528   CREATININE 0.8 04/03/2015   GLUCOSE 131* 07/21/2015 0518   GLUCOSE 147* 07/20/2015 0528   CALCIUM 8.1* 07/21/2015 0518   CALCIUM 8.3* 07/20/2015 0528   AST 13* 07/13/2015 1043   AST 9 06/07/2015 1406   ALT 7* 07/13/2015 1043   ALT 6 06/07/2015 1406   ALKPHOS 74 07/13/2015 1043   ALKPHOS 72 06/07/2015 1406   BILITOT 0.1* 07/13/2015 1043   BILITOT 0.3 06/07/2015 1406   PROT 6.9 07/13/2015 1043   PROT 7.0 06/07/2015 1406    ALBUMIN 2.8* 07/13/2015 1043   ALBUMIN 3.4* 06/07/2015 1406     Studies/Results: No results found.  Anti-infectives: Anti-infectives    Start     Dose/Rate Route Frequency Ordered Stop   07/18/15 1700  ceFAZolin (ANCEF) IVPB 2 g/50 mL premix     2 g 100 mL/hr over 30 Minutes Intravenous 3 times per day 07/18/15 1534 07/18/15 1728   07/18/15 0714  ceFAZolin (ANCEF) IVPB 2 g/50 mL premix     2 g 100 mL/hr over 30 Minutes Intravenous On call to O.R. 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    LOS: 3 days   Plan: Clear liquid diet.    James [name] J 07/21/2015

## 2015-07-22 LAB — CBC
HEMATOCRIT: 24.7 % — AB (ref 39.0–52.0)
HEMOGLOBIN: 7.2 g/dL — AB (ref 13.0–17.0)
MCH: 21.3 pg — ABNORMAL LOW (ref 26.0–34.0)
MCHC: 29.1 g/dL — ABNORMAL LOW (ref 30.0–36.0)
MCV: 73.1 fL — AB (ref 78.0–100.0)
Platelets: 269 10*3/uL (ref 150–400)
RBC: 3.38 MIL/uL — AB (ref 4.22–5.81)
RDW: 16.6 % — AB (ref 11.5–15.5)
WBC: 10.4 10*3/uL (ref 4.0–10.5)

## 2015-07-22 LAB — TYPE AND SCREEN
ABO/RH(D): O NEG
ANTIBODY SCREEN: NEGATIVE
UNIT DIVISION: 0
Unit division: 0

## 2015-07-22 LAB — BASIC METABOLIC PANEL
ANION GAP: 7 (ref 5–15)
BUN: 8 mg/dL (ref 6–20)
CALCIUM: 8.3 mg/dL — AB (ref 8.9–10.3)
CHLORIDE: 101 mmol/L (ref 101–111)
CO2: 27 mmol/L (ref 22–32)
Creatinine, Ser: 0.59 mg/dL — ABNORMAL LOW (ref 0.61–1.24)
GFR calc non Af Amer: >60 mL/min (ref >60)
GLUCOSE: 147 mg/dL — AB (ref 65–99)
POTASSIUM: 4.1 mmol/L (ref 3.5–5.1)
Sodium: 135 mmol/L (ref 135–145)

## 2015-07-22 LAB — GLUCOSE, CAPILLARY
GLUCOSE-CAPILLARY: 139 mg/dL — AB (ref 65–99)
GLUCOSE-CAPILLARY: 162 mg/dL — AB (ref 65–99)
Glucose-Capillary: 117 mg/dL — ABNORMAL HIGH (ref 65–99)
Glucose-Capillary: 132 mg/dL — ABNORMAL HIGH (ref 65–99)
Glucose-Capillary: 147 mg/dL — ABNORMAL HIGH (ref 65–99)
Glucose-Capillary: 148 mg/dL — ABNORMAL HIGH (ref 65–99)

## 2015-07-22 MED ORDER — OSMOLITE 1.5 CAL PO LIQD: 1000.0000 mL | ORAL | Status: DC

## 2015-07-22 MED ORDER — OSMOLITE 1.5 CAL PO LIQD
1000.0000 mL | ORAL | Status: DC
  Administered 2015-07-22 – 2015-07-23 (×2): 1000 mL
  Filled 2015-07-22 (×3): qty 1000

## 2015-07-22 NOTE — Progress Notes (Signed)
4 Days Post-Op  Subjective: Passing a lot of gas.  Tolerated some of the clear liquids.  Objective: Vital signs in last 24 hours: Temp:  [97.6 F (36.4 C)-99.4 F (37.4 C)] 97.6 F (36.4 C) (10/16 0600) Pulse Rate:  [94-102] 97 (10/16 0600) Resp:  [16-18] 16 (10/16 0600) BP: (118-126)/(70-72) 119/70 mmHg (10/16 0600) SpO2:  [95 %-100 %] 100 % (10/16 0600) Weight:  [82.7 kg (182 lb 5.1 oz)] 82.7 kg (182 lb 5.1 oz) (10/16 0500) Last BM Date: 07/19/15  Intake/Output from previous day: 10/15 0701 - 10/16 0700 In: 2439.5 [P.O.:360; I.V.:604.2; NG/GT:1475.3] Out: 2275 [Urine:2275] Intake/Output this shift:    PE: General- In NAD Abdomen-soft, incision clean and intact Lab Results:   Recent Labs  07/21/15 0518 07/22/15 0621  WBC 10.0 10.4  HGB 7.0* 7.2*  HCT 24.0* 24.7*  PLT 265 269   BMET  Recent Labs  07/21/15 0518 07/22/15 0621  NA 135 135  K 3.9 4.1  CL 100* 101  CO2 29 27  GLUCOSE 131* 147*  BUN 7 8  CREATININE 0.62 0.59*  CALCIUM 8.1* 8.3*   PT/INR No results for input(s): LABPROT, INR in the last 72 hours. Comprehensive Metabolic Panel:    Component Value Date/Time   NA 135 07/22/2015 0621   NA 135 07/21/2015 0518   NA 138 04/03/2015   K 4.1 07/22/2015 0621   K 3.9 07/21/2015 0518   CL 101 07/22/2015 0621   CL 100* 07/21/2015 0518   CO2 27 07/22/2015 0621   CO2 29 07/21/2015 0518   BUN 8 07/22/2015 0621   BUN 7 07/21/2015 0518   CREATININE 0.59* 07/22/2015 0621   CREATININE 0.62 07/21/2015 0518   CREATININE 0.8 04/03/2015   GLUCOSE 147* 07/22/2015 0621   GLUCOSE 131* 07/21/2015 0518   CALCIUM 8.3* 07/22/2015 0621   CALCIUM 8.1* 07/21/2015 0518   AST 13* 07/13/2015 1043   AST 9 06/07/2015 1406   ALT 7* 07/13/2015 1043   ALT 6 06/07/2015 1406   ALKPHOS 74 07/13/2015 1043   ALKPHOS 72 06/07/2015 1406   BILITOT 0.1* 07/13/2015 1043   BILITOT 0.3 06/07/2015 1406   PROT 6.9 07/13/2015 1043   PROT 7.0 06/07/2015 1406   ALBUMIN 2.8*  07/13/2015 1043   ALBUMIN 3.4* 06/07/2015 1406     Studies/Results: No results found.  Anti-infectives: Anti-infectives    Start     Dose/Rate Route Frequency Ordered Stop   07/18/15 1700  ceFAZolin (ANCEF) IVPB 2 g/50 mL premix     2 g 100 mL/hr over 30 Minutes Intravenous 3 times per day 07/18/15 1534 07/18/15 1728   07/18/15 0714  ceFAZolin (ANCEF) IVPB 2 g/50 mL premix     2 g 100 mL/hr over 30 Minutes Intravenous On call to O.R. 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   James [name] 07/22/2015

## 2015-07-23 LAB — BASIC METABOLIC PANEL
ANION GAP: 8 (ref 5–15)
BUN: 10 mg/dL (ref 6–20)
CHLORIDE: 101 mmol/L (ref 101–111)
CO2: 27 mmol/L (ref 22–32)
Calcium: 8.2 mg/dL — ABNORMAL LOW (ref 8.9–10.3)
Creatinine, Ser: 0.55 mg/dL — ABNORMAL LOW (ref 0.61–1.24)
GFR calc Af Amer: >60 mL/min (ref >60)
GLUCOSE: 129 mg/dL — AB (ref 65–99)
POTASSIUM: 4.1 mmol/L (ref 3.5–5.1)
Sodium: 136 mmol/L (ref 135–145)

## 2015-07-23 LAB — CBC
HCT: 23.2 % — ABNORMAL LOW (ref 39.0–52.0)
HEMOGLOBIN: 7 g/dL — AB (ref 13.0–17.0)
MCH: 21.9 pg — AB (ref 26.0–34.0)
MCHC: 30.2 g/dL (ref 30.0–36.0)
MCV: 72.7 fL — AB (ref 78.0–100.0)
Platelets: 208 10*3/uL (ref 150–400)
RBC: 3.19 MIL/uL — AB (ref 4.22–5.81)
RDW: 16.6 % — ABNORMAL HIGH (ref 11.5–15.5)
WBC: 9.6 10*3/uL (ref 4.0–10.5)

## 2015-07-23 LAB — GLUCOSE, CAPILLARY
GLUCOSE-CAPILLARY: 114 mg/dL — AB (ref 65–99)
GLUCOSE-CAPILLARY: 138 mg/dL — AB (ref 65–99)
Glucose-Capillary: 133 mg/dL — ABNORMAL HIGH (ref 65–99)
Glucose-Capillary: 123 mg/dL — ABNORMAL HIGH (ref 65–99)
Glucose-Capillary: 149 mg/dL — ABNORMAL HIGH (ref 65–99)

## 2015-07-23 MED ORDER — PANCRELIPASE (LIP-PROT-AMYL) 12000-38000 UNITS PO CPEP
2.0000 | ORAL_CAPSULE | Freq: Once | ORAL | Status: AC
  Administered 2015-07-23: 24000 [IU] via ORAL
  Filled 2015-07-23: qty 2

## 2015-07-23 MED ORDER — SODIUM BICARBONATE 650 MG PO TABS
650.0000 mg | ORAL_TABLET | Freq: Once | ORAL | Status: AC
  Administered 2015-07-23: 650 mg via ORAL
  Filled 2015-07-23: qty 1

## 2015-07-23 NOTE — Progress Notes (Signed)
I text paged MD earlier to let him know that pt's tube was clotted. No response yet.

## 2015-07-23 NOTE — Progress Notes (Signed)
Pt's J tube became clogged. I initiated the unclogging feeding tube protocol and inserted pancrease and bicarb per policy. Tube flushed better, but the feeding would not go thru the tube. Capped tube off for now. Will notify provider on call.

## 2015-07-23 NOTE — Care Management Important Message (Signed)
Important Message  Patient Details  Name: CASEN PRYOR MRN: [id number] Date of Birth: [birth year]   Medicare Important Message Given:  Yes-third notification given    Camillo Flaming 07/23/2015, 12:51 [place] Message  Patient Details  Name: ERIN UECKER MRN: [id number] Date of Birth: 12/26/1945   Medicare Important Message Given:  Yes-third notification given    Camillo Flaming 07/23/2015, 12:50 PM

## 2015-07-23 NOTE — Progress Notes (Signed)
I offered pt liquid oxycodone for pain, but he and his wife stated that they were afraid to take it on an empty stomach,and requested the morphine instead. Pt pain increased to 10 yesterday, and as a result he requests pain med when his level gets at a 4. Instead of 4 mg every 4 hrs as scheduled he asks for 2 mg morphine about every 2 hrs. He and his wife agreed to try the liquid oxycodone after breakfast.

## 2015-07-24 LAB — GLUCOSE, CAPILLARY
GLUCOSE-CAPILLARY: 175 mg/dL — AB (ref 65–99)
Glucose-Capillary: 117 mg/dL — ABNORMAL HIGH (ref 65–99)
Glucose-Capillary: 133 mg/dL — ABNORMAL HIGH (ref 65–99)

## 2015-07-24 MED ORDER — OXYCODONE HCL 5 MG/5ML PO SOLN: 10.0000 mg | Freq: Four times a day (QID) | ORAL | Status: DC | PRN

## 2015-07-24 MED ORDER — ONDANSETRON 4 MG PO TBDP: 4.0000 mg | ORAL_TABLET | Freq: Four times a day (QID) | ORAL | Status: DC | PRN

## 2015-07-24 MED ORDER — OMEPRAZOLE 20 MG PO CPDR: 20.0000 mg | DELAYED_RELEASE_CAPSULE | Freq: Every day | ORAL | Status: DC

## 2015-07-24 MED ORDER — JEVITY 1.5 CAL PO LIQD: 1000.0000 mL | ORAL | Status: DC

## 2015-07-24 MED ORDER — FREE WATER: 240.0000 mL | Freq: Four times a day (QID) | Status: DC

## 2015-07-24 MED ORDER — OSMOLITE 1.5 CAL PO LIQD: 1000.0000 mL | ORAL | Status: DC

## 2015-07-24 NOTE — Progress Notes (Signed)
6 Days Post-Op  Subjective: Tolerating full liquid diet, no nausea or vomiting, walking up in the halls  Objective: Vital signs in last 24 hours: Temp:  [98.6 F (37 C)-98.7 F (37.1 C)] 98.6 F (37 C) (10/18 0522) Pulse Rate:  [87-89] 87 (10/18 0522) Resp:  [16] 16 (10/18 0522) BP: (118-126)/(68-69) 126/68 mmHg (10/18 0522) SpO2:  [95 %-96 %] 96 % (10/18 0522) Weight:  [79.2 kg (174 lb 9.7 oz)] 79.2 kg (174 lb 9.7 oz) (10/18 0522) Last BM Date: 07/23/15  Intake/Output from previous day: 10/17 0701 - 10/18 0700 In: 2113 [P.O.:420; I.V.:733; NG/GT:960] Out: -  Intake/Output this shift:    General appearance: alert and cooperative Head: Normocephalic, without obvious abnormality, atraumatic Resp: clear to auscultation bilaterally Cardio: regular rate and rhythm, S1, S2 normal, no murmur, click, rub or gallop GI: soft, non-tender; bowel sounds normal; no masses,  no organomegaly and incision c/d/i, no erythema, J tube in place without leakage  Lab Results:   Recent Labs  07/22/15 0621 07/23/15 0559  WBC 10.4 9.6  HGB 7.2* 7.0*  HCT 24.7* 23.2*  PLT 269 208   BMET  Recent Labs  07/22/15 0621 07/23/15 0559  NA 135 136  K 4.1 4.1  CL 101 101  CO2 27 27  GLUCOSE 147* 129*  BUN 8 10  CREATININE 0.59* 0.55*  CALCIUM 8.3* 8.2*   PT/INR No results for input(s): LABPROT, INR in the last 72 hours. ABG No results for input(s): PHART, HCO3 in the last 72 hours.  Invalid input(s): PCO2, PO2  Studies/Results: No results found.  Anti-infectives: Anti-infectives    Start     Dose/Rate Route Frequency Ordered Stop   07/18/15 1700  ceFAZolin (ANCEF) IVPB 2 g/50 mL premix     2 g 100 mL/hr over 30 Minutes Intravenous 3 times per day 07/18/15 1534 07/18/15 1728   07/18/15 0714  ceFAZolin (ANCEF) IVPB 2 g/50 mL premix     2 g 100 mL/hr over 30 Minutes Intravenous On call to O.R. 07/18/15 0714 07/18/15 0908      Assessment/Plan: s/p Procedure(s): LAPAROSCOPY  DIAGNOSTIC (N/A) SUB TOTAL GASTRECTOMY (N/A) FEEDING TUBE PLACEMENT (N/A) -arrange for home care needs -continue to ambulate -feeding tube care training  LOS: 6 days    James [name] James [name] 07/24/2015

## 2015-07-24 NOTE — Discharge Summary (Signed)
Physician Discharge Summary  Patient ID: James [name] MRN: [id number] DOB/AGE: 10/06/1946 69 y.o.  Admit date: 07/18/2015 Discharge date: 07/24/2015  Admission Diagnoses:  Discharge Diagnoses:  Active Problems:   Gastric adenocarcinoma [facility])   Discharged Condition: good  Hospital Course: 69 yo male with gastric cancer underwent subtotal gastrectomy with esophagogastrostomy and J tube. Postoperatively he was extubated and admitted for care. He was started on J tube feeds POD 2 and NG tube and foley removed POD 3. He slowly advanced to a full liquid diet. At time of discharge he was walking in the halls, half of his staples were removed with steristrips put in place. His OnQ pump had been removed. He went home tolerating 40cc/h J tube feeds.  Consults: None  Significant Diagnostic Studies: labs: HGB stable at 7.0  Treatments: surgery: subtotal gastrectomy with J tube creation  Discharge Exam: Blood pressure 111/66, pulse 93, temperature 98.2 F (36.8 C), temperature source Oral, resp. rate 18, height 5' 6.75" (1.695 m), weight 79.2 kg (174 lb 9.7 oz), SpO2 97 %. General appearance: alert and cooperative Head: Normocephalic, without obvious abnormality, atraumatic Resp: clear to auscultation bilaterally Cardio: regular rate and rhythm, S1, S2 normal, no murmur, click, rub or gallop GI: incisions c/d/i, J tube in good position  Disposition: 01-Home or Self Care  Discharge Instructions    Diet - low sodium heart healthy    Complete by:  As directed      Discharge instructions    Complete by:  As directed   Slowly advance to post gastrectomy diet as discussed.  Ok to shower. Pat incisions dry. Do not submerge incisions for 3 more weeks.     Increase activity slowly    Complete by:  As directed             Medication List    STOP taking these medications        amoxicillin 500 MG capsule  Commonly known as:  AMOXIL     cyanocobalamin 1000 MCG/ML injection  Commonly  known as:  (VITAMIN B-12)     multivitamin capsule     ranitidine 150 MG tablet  Commonly known as:  ZANTAC      TAKE these medications        feeding supplement (OSMOLITE 1.5 CAL) Liqd  Place 1,000 mLs into feeding tube continuous. 7ml/h     free water Soln  Place 240 mLs into feeding tube 4 (four) times daily.     mirtazapine 15 MG tablet  Commonly known as:  REMERON  Take 1 tablet (15 mg total) by mouth at bedtime.     omeprazole 20 MG capsule  Commonly known as:  PRILOSEC  Take 1 capsule (20 mg total) by mouth daily.     ondansetron 4 MG disintegrating tablet  Commonly known as:  ZOFRAN-ODT  Take 1 tablet (4 mg total) by mouth every 6 (six) hours as needed for nausea.     oxyCODONE 5 MG/5ML solution  Commonly known as:  ROXICODONE  Take 10 mLs (10 mg total) by mouth every 6 (six) hours as needed for moderate pain.     pravastatin 40 MG tablet  Commonly known as:  PRAVACHOL  Take 1 tablet (40 mg total) by mouth daily.           Follow-up Information    Follow up with [facility].   Why:  nurse and physical therapy   Contact information:   Mobile  [place] [address] [phone]       Follow up with [facility], MD In 2 weeks.   Specialty:  General Surgery   Contact information:   [address] [place] [address] [phone]       Signed: Arta Bruce Remmie Bembenek 07/24/2015, 12:36 PM

## 2015-07-24 NOTE — Progress Notes (Signed)
Patient alert and oriented with spouse present in room.  Patient and spouse educated on how to use Kangaroo pump and free water flush. Verbalized understanding of use and demonstrated use of pump and how to clamp tube if needed. Patient given prescriptions for medications and discharge instructions. All questions answered.

## 2015-07-25 ENCOUNTER — Telehealth: Payer: Self-pay | Admitting: *Deleted

## 2015-07-25 NOTE — Telephone Encounter (Signed)
Pt was on TCM list dx 07/24/15 has Gastric cancer had surgery Subtotal Gastrectomy w/J tube. Pt will be f/u with surgeon Dr. Barry Dienes in 2 wks...James [name]

## 2015-08-06 ENCOUNTER — Telehealth: Payer: Self-pay | Admitting: Oncology

## 2015-08-06 ENCOUNTER — Telehealth: Payer: Self-pay | Admitting: *Deleted

## 2015-08-06 ENCOUNTER — Ambulatory Visit (HOSPITAL_BASED_OUTPATIENT_CLINIC_OR_DEPARTMENT_OTHER): Payer: Medicare Other | Admitting: Oncology

## 2015-08-06 VITALS — BP 121/79 | HR 84 | Temp 98.6°F | Resp 18 | Ht 66.75 in | Wt 179.3 lb

## 2015-08-06 DIAGNOSIS — C168 Malignant neoplasm of overlapping sites of stomach: Secondary | ICD-10-CM | POA: Diagnosis not present

## 2015-08-06 DIAGNOSIS — E538 Deficiency of other specified B group vitamins: Secondary | ICD-10-CM

## 2015-08-06 DIAGNOSIS — D649 Anemia, unspecified: Secondary | ICD-10-CM

## 2015-08-06 DIAGNOSIS — C161 Malignant neoplasm of fundus of stomach: Secondary | ICD-10-CM

## 2015-08-06 NOTE — Telephone Encounter (Signed)
Receive call from pharmacist stating pt is wanting to get a pneumonia 57. Inform her pt already had the polysacc. pneumonia 23 back on 12/27/10...James [name]

## 2015-08-06 NOTE — Progress Notes (Signed)
  [facility] OFFICE PROGRESS NOTE   Diagnosis: Gastric cancer  INTERVAL HISTORY:   James [name] returns as scheduled. He underwent a diagnostic laparoscopy and subtotal gastrectomy with placement of a feeding tube on 07/18/2015. He is now maintained on tube feedings.  A tumor was found at the fundus of the stomach. There was no evidence of carcinomatosis. The pathology [id number]) revealed an invasive adenocarcinoma extending into muscularis propria. The resection margins and 17 lymph nodes were negative. A benign 0.6 cm subserosal spindle cell nodule was noted. The tumor was moderately differentiated. No lymphovascular or perineural invasion. Tumor extended into the submucosa and superficial portions of the muscularis propria. The subserosal spindle cell tumor was positive for CD117 and CD34 consistent with an incidental low-grade gastrointestinal stromal tumor.  Objective:  Vital signs in last 24 hours:  Blood pressure 121/79, pulse 84, temperature 98.6 F (37 C), temperature source Oral, resp. rate 18, height 5' 6.75" (1.695 m), weight 179 lb 4.8 oz (81.33 kg), SpO2 100 %.    HEENT: Neck without mass Resp: Lungs clear bilaterally Cardio: Regular rate and rhythm GI: No hepatomegaly, no mass, midline incision with a packing in place, left upper quadrant jejunostomy site without evidence of dissection Vascular: No leg edema   Lab Results:  Lab Results  Component Value Date   WBC 9.6 07/23/2015   HGB 7.0* 07/23/2015   HCT 23.2* 07/23/2015   MCV 72.7* 07/23/2015   PLT 208 07/23/2015   NEUTROABS 5.6 05/30/2015     Medications: I have reviewed the patient's current medications.  Assessment/Plan: 1. Gastric cancer-adenocarcinoma of the gastric fundus/cardia  Staging CT scans 06/07/2015 with no evidence of distant metastatic disease, tiny nonspecific pulmonary nodules and liver lesions-likely benign  Subtotal gastrectomy and placement of a jejunostomy feeding  tube 07/18/2015 for a T2,N0 tumor with negative surgical margins 2. Microcytic anemia-likely iron deficiency anemia secondary to #1  3. Vitamin B-12 deficiency -now on vitamin B-12 replacement  4. 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 He has undergone a partial gastrectomy with negative surgical margins and multiple negative lymph nodes. He has stage IB (T2,N0) disease. I explained the data supporting the use of adjuvant chemotherapy and radiation in patients with more advanced disease. There is no clear recommendation for adjuvant therapy in patients with stage I disease. I discussed the case with Dr. Valere Dross. He does not recommend adjuvant radiation. My initial impression is to not recommend adjuvant chemotherapy.  James [name] will return for an office visit and further discussion 08/23/2015. He continues postoperative care with Dr. Barry Dienes.  Betsy Coder, MD  08/06/2015  5:44 PM

## 2015-08-06 NOTE — Telephone Encounter (Signed)
per pf to sch pt appt-cld & spoke to pt and adv of appt time & date

## 2015-08-14 ENCOUNTER — Other Ambulatory Visit: Payer: Self-pay | Admitting: General Surgery

## 2015-08-14 DIAGNOSIS — K219 Gastro-esophageal reflux disease without esophagitis: Secondary | ICD-10-CM

## 2015-08-14 DIAGNOSIS — C162 Malignant neoplasm of body of stomach: Secondary | ICD-10-CM

## 2015-08-15 ENCOUNTER — Other Ambulatory Visit: Payer: Self-pay

## 2015-08-15 ENCOUNTER — Telehealth: Payer: Self-pay | Admitting: Internal Medicine

## 2015-08-15 ENCOUNTER — Ambulatory Visit (HOSPITAL_COMMUNITY)
Admission: RE | Admit: 2015-08-15 | Discharge: 2015-08-15 | Disposition: A | Discharge status: Home / Self Care | Payer: Medicare Other | Source: Ambulatory Visit | Attending: General Surgery | Admitting: General Surgery

## 2015-08-15 ENCOUNTER — Other Ambulatory Visit: Payer: Self-pay | Admitting: General Surgery

## 2015-08-15 DIAGNOSIS — Z903 Acquired absence of stomach [part of]: Secondary | ICD-10-CM | POA: Diagnosis not present

## 2015-08-15 DIAGNOSIS — K219 Gastro-esophageal reflux disease without esophagitis: Secondary | ICD-10-CM | POA: Insufficient documentation

## 2015-08-15 DIAGNOSIS — Z934 Other artificial openings of gastrointestinal tract status: Secondary | ICD-10-CM | POA: Insufficient documentation

## 2015-08-15 DIAGNOSIS — K228 Other specified diseases of esophagus: Secondary | ICD-10-CM | POA: Insufficient documentation

## 2015-08-15 DIAGNOSIS — C162 Malignant neoplasm of body of stomach: Secondary | ICD-10-CM | POA: Insufficient documentation

## 2015-08-15 DIAGNOSIS — K222 Esophageal obstruction: Secondary | ICD-10-CM

## 2015-08-15 NOTE — Telephone Encounter (Signed)
Patient notified of the institutions to arrive at [facility] on 08/17/15 at 11:30 and stop feedings and be NPO after 9:00 am.

## 2015-08-15 NOTE — Telephone Encounter (Signed)
He has a stricture at GE junction and needs a dilation - [facility] has availability Fri 1-130 - please set up for MAC if available at 115 or 130 and I need fluoro available  EGD, balloon vs savary, likely balloon  I have explained risks benefits and indications to him  He is not currently eating - but remind him about NPO, etc He has a J tube

## 2015-08-16 ENCOUNTER — Encounter (HOSPITAL_COMMUNITY): Payer: Self-pay | Admitting: *Deleted

## 2015-08-17 ENCOUNTER — Encounter (HOSPITAL_COMMUNITY): Payer: Self-pay

## 2015-08-17 ENCOUNTER — Ambulatory Visit (HOSPITAL_COMMUNITY): Payer: Medicare Other | Admitting: Certified Registered Nurse Anesthetist

## 2015-08-17 ENCOUNTER — Ambulatory Visit (HOSPITAL_COMMUNITY): Payer: Medicare Other

## 2015-08-17 ENCOUNTER — Encounter (HOSPITAL_COMMUNITY)
Admission: RE | Disposition: A | Discharge status: Home / Self Care | Payer: Self-pay | Source: Ambulatory Visit | Attending: Internal Medicine

## 2015-08-17 ENCOUNTER — Ambulatory Visit (HOSPITAL_COMMUNITY)
Admission: RE | Admit: 2015-08-17 | Discharge: 2015-08-17 | Disposition: A | Discharge status: Home / Self Care | Payer: Medicare Other | Source: Ambulatory Visit | Attending: Internal Medicine | Admitting: Internal Medicine

## 2015-08-17 DIAGNOSIS — Z79899 Other long term (current) drug therapy: Secondary | ICD-10-CM | POA: Diagnosis not present

## 2015-08-17 DIAGNOSIS — Z903 Acquired absence of stomach [part of]: Secondary | ICD-10-CM | POA: Diagnosis not present

## 2015-08-17 DIAGNOSIS — E538 Deficiency of other specified B group vitamins: Secondary | ICD-10-CM | POA: Diagnosis not present

## 2015-08-17 DIAGNOSIS — T85858A Stenosis due to other internal prosthetic devices, implants and grafts, initial encounter: Secondary | ICD-10-CM | POA: Insufficient documentation

## 2015-08-17 DIAGNOSIS — Z87891 Personal history of nicotine dependence: Secondary | ICD-10-CM | POA: Diagnosis not present

## 2015-08-17 DIAGNOSIS — Z85028 Personal history of other malignant neoplasm of stomach: Secondary | ICD-10-CM | POA: Diagnosis not present

## 2015-08-17 DIAGNOSIS — K219 Gastro-esophageal reflux disease without esophagitis: Secondary | ICD-10-CM

## 2015-08-17 DIAGNOSIS — Y832 Surgical operation with anastomosis, bypass or graft as the cause of abnormal reaction of the patient, or of later complication, without mention of misadventure at the time of the procedure: Secondary | ICD-10-CM | POA: Insufficient documentation

## 2015-08-17 DIAGNOSIS — E785 Hyperlipidemia, unspecified: Secondary | ICD-10-CM | POA: Diagnosis not present

## 2015-08-17 DIAGNOSIS — R1310 Dysphagia, unspecified: Secondary | ICD-10-CM | POA: Diagnosis present

## 2015-08-17 DIAGNOSIS — J45909 Unspecified asthma, uncomplicated: Secondary | ICD-10-CM | POA: Insufficient documentation

## 2015-08-17 DIAGNOSIS — K222 Esophageal obstruction: Secondary | ICD-10-CM | POA: Insufficient documentation

## 2015-08-17 DIAGNOSIS — Z8546 Personal history of malignant neoplasm of prostate: Secondary | ICD-10-CM | POA: Diagnosis not present

## 2015-08-17 HISTORY — PX: ESOPHAGOGASTRODUODENOSCOPY (EGD) WITH PROPOFOL: SHX5813

## 2015-08-17 SURGERY — ESOPHAGOGASTRODUODENOSCOPY (EGD) WITH PROPOFOL
Anesthesia: Monitor Anesthesia Care

## 2015-08-17 MED ORDER — PROPOFOL 10 MG/ML IV BOLUS
INTRAVENOUS | Status: DC | PRN
  Administered 2015-08-17 (×2): 30 mg via INTRAVENOUS
  Administered 2015-08-17 (×2): 20 mg via INTRAVENOUS

## 2015-08-17 MED ORDER — SODIUM CHLORIDE 0.9 % IV SOLN: INTRAVENOUS | Status: DC

## 2015-08-17 MED ORDER — PRAVASTATIN SODIUM 40 MG PO TABS: 40.0000 mg | ORAL_TABLET | Freq: Every day | ORAL | Status: DC

## 2015-08-17 MED ORDER — OMEPRAZOLE 40 MG PO CPDR: 40.0000 mg | DELAYED_RELEASE_CAPSULE | Freq: Two times a day (BID) | ORAL | Status: DC

## 2015-08-17 MED ORDER — LIDOCAINE HCL (CARDIAC) 20 MG/ML IV SOLN
INTRAVENOUS | Status: DC | PRN
  Administered 2015-08-17: 100 mg via INTRAVENOUS

## 2015-08-17 MED ORDER — LACTATED RINGERS IV SOLN
INTRAVENOUS | Status: DC | PRN
  Administered 2015-08-17: 14:00:00 via INTRAVENOUS

## 2015-08-17 MED ORDER — PROPOFOL 500 MG/50ML IV EMUL
INTRAVENOUS | Status: DC | PRN
Start: 2015-08-17 — End: 2015-08-17
  Administered 2015-08-17: 100 ug/kg/min via INTRAVENOUS

## 2015-08-17 MED ORDER — BUTAMBEN-TETRACAINE-BENZOCAINE 2-2-14 % EX AERO
INHALATION_SPRAY | CUTANEOUS | Status: DC | PRN
  Administered 2015-08-17: 2 via TOPICAL

## 2015-08-17 NOTE — Anesthesia Preprocedure Evaluation (Addendum)
Anesthesia Evaluation  Patient identified by MRN, date of birth, ID band Patient awake    Reviewed: Allergy & Precautions, H&P , NPO status , Patient's Chart, lab work & pertinent test results  History of Anesthesia Complications Negative for: history of anesthetic complications  Airway Mallampati: II  TM Distance: >3 FB Neck ROM: full    Dental no notable dental hx. (+) Dental Advisory Given, Caps,    Pulmonary asthma , former smoker,    Pulmonary exam normal breath sounds clear to auscultation       Cardiovascular Exercise Tolerance: Good (-) hypertension(-) angina(-) Past MI negative cardio ROS Normal cardiovascular exam+ Valvular Problems/Murmurs  Rhythm:regular Rate:Normal     Neuro/Psych Depression negative neurological ROS  negative psych ROS   GI/Hepatic Neg liver ROS, PUD, GERD  Medicated and Controlled,Stomach cancer    Endo/Other  negative endocrine ROS  Renal/GU negative Renal ROS  negative genitourinary   Musculoskeletal negative musculoskeletal ROS (+)   Abdominal   Peds  Hematology  (+) Blood dyscrasia, anemia ,   Anesthesia Other Findings   Reproductive/Obstetrics                            Anesthesia Physical  Anesthesia Plan  ASA: III  Anesthesia Plan: MAC   Post-op Pain Management:    Induction: Intravenous  Airway Management Planned: Nasal Cannula  Additional Equipment:   Intra-op Plan:   Post-operative Plan:   Informed Consent: I have reviewed the patients History and Physical, chart, labs and discussed the procedure including the risks, benefits and alternatives for the proposed anesthesia with the patient or authorized representative who has indicated his/her understanding and acceptance.   Dental advisory given  Plan Discussed with: CRNA  Anesthesia Plan Comments: (Esophageal dilation)       Anesthesia Quick Evaluation

## 2015-08-17 NOTE — H&P (Signed)
[facility] History and Physical   Primary Care Physician:  [name], MD   Reason for Procedure:  Dysphagia and obstructed anastomosis after gastrectomy  Plan:    EGD, dilate obstruction. The risks and benefits as well as alternatives of endoscopic procedure(s) have been discussed and reviewed. All questions answered. The patient agrees to proceed.  HPI: James [name] is a 69 y.o. 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 Did well initially but in past 1-2 weeks worsening dysphagia to point of unable to take in liquids. UGI shows very tight stenosis at GE junction area. Has J tube for nutrition and hydration   Past Medical History  Diagnosis Date  . Abnormal EKG     NS ST-T EKG Changes, (-) Nuclear Stress Test 03/2006  . Hyperlipemia   . Duodenal ulcer 1972    transfusion required, 1 unit packed cells  . Hx of basal cell carcinoma   . S/P radiation therapy 11/29/2014 through 01/23/2015     Prostate 7800 cGy in 40 sessions, seminal vesicles 5600 cGy in 40 sessions   . B12 deficiency 06/05/2015    B12 Low NL at 271 methtylmalonic acid high  . Transfusion history     '72 -s/p surgery for duodenal ulcer  . Anemia     iron deficency  . Prostate cancer ([place]) 09/06/14    last radiation 4'16   . Gastric cancer ([place])   . Skin cancer, basal cell   . Cardiac murmur     nothing to be concerned with   . Asthma   . Esophageal reflux     no currlently    Past Surgical History  Procedure Laterality Date  . Trigger finger release Left 1998  . Rotator cuff repair Right     R shoulder, [place]  . Knee arthroscopy Right     Right Knee, [place] [facility]  . Colonoscopy with polypectomy  02/2012    Dr Sharlett Iles  . Prostate biopsy  09/06/14  . Cataract extraction, bilateral Bilateral   . Vasectomy    . Eus N/A 06/14/2015    Procedure: ESOPHAGEAL ENDOSCOPIC  ULTRASOUND (EUS) RADIAL;  Surgeon: Milus Banister, MD;  Location: [facility];  Service: Endoscopy;  Laterality: N/A;  . Laparoscopy N/A 07/18/2015    Procedure: LAPAROSCOPY DIAGNOSTIC;  Surgeon: Stark Klein, MD;  Location: [facility];  Service: General;  Laterality: N/A;  . Laparoscopic gastrectomy N/A 07/18/2015    Procedure: SUB TOTAL GASTRECTOMY;  Surgeon: Stark Klein, MD;  Location: [facility];  Service: General;  Laterality: N/A;  . Laparoscopic gastrostomy N/A 07/18/2015    Procedure: FEEDING TUBE PLACEMENT;  Surgeon: Stark Klein, MD;  Location: [facility];  Service: General;  Laterality: N/A;    Prior to Admission medications   Medication Sig Start Date End Date Taking? Authorizing Provider  Nutritional Supplements (FEEDING SUPPLEMENT, JEVITY 1.5 CAL,) LIQD Place 1,000 mLs into feeding tube continuous. Put into J tube via pump at 40cc/h Patient taking differently: Place 1,000 mLs into feeding tube continuous. Put into J tube via pump at 65cc/h- 12 hours a day 07/24/15  Yes Arta Bruce Kinsinger, MD  omeprazole (PRILOSEC) 20 MG capsule Take 1 capsule (20 mg total) by mouth daily. 07/24/15  Yes Arta Bruce Kinsinger, MD  pravastatin (PRAVACHOL) 40 MG tablet Take 1 tablet (40 mg total) by mouth daily. Patient taking differently: Take 40 mg by mouth every evening.  02/26/15  Yes Hendricks Limes, MD  Water  For Irrigation, Sterile (FREE WATER) SOLN Place 240 mLs into feeding tube 4 (four) times daily. 07/24/15  Yes Arta Bruce Kinsinger, MD  ondansetron (ZOFRAN-ODT) 4 MG disintegrating tablet Take 1 tablet (4 mg total) by mouth every 6 (six) hours as needed for nausea. Patient not taking: Reported on 08/06/2015 07/24/15   Mickeal Skinner, MD    Current Facility-Administered Medications  Medication Dose Route Frequency Provider Last Rate Last Dose  . 0.9 %  sodium chloride infusion   Intravenous Continuous Gatha Mayer, MD        Allergies as of 08/15/2015 - Review Complete 08/15/2015   Allergen Reaction Noted  . Codeine      Family History  Problem Relation Age of Onset  . Diabetes Father   . Heart attack Father 28  . Liver disease Father     Related to Alcohol Use   . Alcohol abuse Mother   . Cirrhosis Mother     died in her 7s  . Multiple myeloma Brother   . Colon cancer Neg Hx   . Stomach cancer Neg Hx   . Stroke Neg Hx   . Diabetes Sister     Social History   Social History  . Marital Status: Married    Spouse Name: N/A  . Number of Children: 2  . Years of Education: N/A   Occupational History  . retired    Social History Main Topics  . Smoking status: Former Smoker -- 0.50 packs/day for 5 years    Types: Cigarettes    Quit date: 10/07/1967  . Smokeless tobacco: Never Used     Comment: smoked [place] , up to < 1 ppd  . Alcohol Use: 0.0 oz/week     Comment:  < 3 / week- none in   month  . Drug Use: No  . Sexual Activity: Not on file   Other Topics Concern  . Not on file   Social History Narrative   Married and retired - 1 son and 1 daughter    Lives at [place] and active golfer, tennis player   05/30/2015       Review of Systems: Some weight loss All other review of systems negative except as mentioned in the HPI.  Physical Exam: Vital signs in last 24 hours: Temp:  [97.7 F (36.5 C)] 97.7 F (36.5 C) (11/11 1157) Pulse Rate:  [68] 68 (11/11 1157) Resp:  [18] 18 (11/11 1157) BP: (127)/(80) 127/80 mmHg (11/11 1157) SpO2:  [97 %] 97 % (11/11 1157) Weight:  [170 lb (77.111 kg)] 170 lb (77.111 kg) (11/11 1157)   General:   Alert,  Well-developed, well-nourished, pleasant and cooperative in NAD Lungs:  Clear throughout to auscultation.   Heart:  Regular rate and rhythm; no murmurs, clicks, rubs,  or gallops. Abdomen:  Soft, nontender and nondistended. Neuro/Psych:  Alert and cooperative. Normal mood and affect. A and O x 3   @[place]  Simonne Maffucci, MD, [facility] [phone] (pager) 08/17/2015 1:11  PM@

## 2015-08-17 NOTE — Discharge Instructions (Addendum)
I dilated the stricture - you will be able to swallow better I think.  Please purchase OTC omeprazole capsules and take 2 twice a day until mail order comes. May need to open into applesauce - only swallow 1 capsule at a time if taking the capsule.  Call my office on Monday with an update and we will determine when to do this again.  Liquids today for an hour then may try very soft foods like applesauce, and thin mashed potatoes and some noodles. Try Dysphagia 2 diet tomorrow.   I appreciate the opportunity to care for you. Gatha Mayer, MD, FACG    YOU HAD AN ENDOSCOPIC PROCEDURE TODAY: Refer to the procedure report and other information in the discharge instructions given to you for any specific questions about what was found during the examination. If this information does not answer your questions, please call Dr. Celesta Aver office at [phone] to clarify.   YOU SHOULD EXPECT: Some feelings of bloating in the abdomen. Passage of more gas than usual. Walking can help get rid of the air that was put into your GI tract during the procedure and reduce the bloating. If you had a lower endoscopy (such as a colonoscopy or flexible sigmoidoscopy) you may notice spotting of blood in your stool or on the toilet paper. Some abdominal soreness may be present for a day or two, also.  DIET:  See above   ACTIVITY: Your care partner should take you home directly after the procedure. You should plan to take it easy, moving slowly for the rest of the day. You can resume normal activity the day after the procedure however YOU SHOULD NOT DRIVE, use power tools, machinery or perform tasks that involve climbing or major physical exertion for 24 hours (because of the sedation medicines used during the test).   SYMPTOMS TO REPORT IMMEDIATELY: A gastroenterologist can be reached at any hour. Please call [phone]  for any of the following symptoms:   Following upper endoscopy (EGD, EUS, ERCP,  esophageal dilation) Vomiting of blood or coffee ground material  New, significant abdominal pain  New, significant chest pain or pain under the shoulder blades  Painful or persistently difficult swallowing  New shortness of breath  Black, tarry-looking or red, bloody stools       Esophagogastroduodenoscopy Esophagogastroduodenoscopy (EGD) is a procedure that is used to examine the lining of the esophagus, stomach, and first part of the small intestine (duodenum). A long, flexible, lighted tube with a camera attached (endoscope) is inserted down the throat to view these organs. This procedure is done to detect problems or abnormalities, such as inflammation, bleeding, ulcers, or growths, in order to treat them. The procedure lasts 5-20 minutes. It is usually an outpatient procedure, but it may need to be performed in a hospital in emergency cases. LET [facility] CARE PROVIDER KNOW ABOUT:  Any allergies you have.  All medicines you are taking, including vitamins, herbs, eye drops, creams, and over-the-counter medicines.  Previous problems you or members of your family have had with the use of anesthetics.  Any blood disorders you have.  Previous surgeries you have had.  Medical conditions you have. RISKS AND COMPLICATIONS Generally, this is a safe procedure. However, problems can occur and include:  Infection.  Bleeding.  Tearing (perforation) of the esophagus, stomach, or duodenum.  Difficulty breathing or not being able to breathe.  Excessive sweating.  Spasms of the larynx.  Slowed heartbeat.  Low blood pressure. BEFORE THE PROCEDURE  Do not eat or drink anything after midnight on the night before the procedure or as directed by your health care provider.  Do not take your regular medicines before the procedure if your health care provider asks you not to. Ask your health care provider about changing or stopping those medicines.  If you wear dentures, be  prepared to remove them before the procedure.  Arrange for someone to drive you home after the procedure. PROCEDURE  A numbing medicine (local anesthetic) may be sprayed in your throat for comfort and to stop you from gagging or coughing.  You will have an IV tube inserted in a vein in your hand or arm. You will receive medicines and fluids through this tube.  You will be given a medicine to relax you (sedative).  A pain reliever will be given through the IV tube.  A mouth guard may be placed in your mouth to protect your teeth and to keep you from biting on the endoscope.  You will be asked to lie on your left side.  The endoscope will be inserted down your throat and into your esophagus, stomach, and duodenum.  Air will be put through the endoscope to allow your health care provider to clearly view the lining of your esophagus.  The lining of your esophagus, stomach, and duodenum will be examined. During the exam, your health care provider may:  Remove tissue to be examined under a microscope (biopsy) for inflammation, infection, or other medical problems.  Remove growths.  Remove objects (foreign bodies) that are stuck.  Treat any bleeding with medicines or other devices that stop tissues from bleeding (hot cautery, clipping devices).  Widen (dilate) or stretch narrowed areas of your esophagus and stomach.  The endoscope will be withdrawn. AFTER THE PROCEDURE  You will be taken to a recovery area for observation. Your blood pressure, heart rate, breathing rate, and blood oxygen level will be monitored often until the medicines you were given have worn off.  Do not eat or drink anything until the numbing medicine has worn off and your gag reflex has returned. You may choke.  Your health care provider should be able to discuss his or her findings with you. It will take longer to discuss the test results if any biopsies were taken.   This information is not intended to  replace advice given to you by your health care provider. Make sure you discuss any questions you have with your health care provider.   Document Released: 01/23/2005 Document Revised: 10/13/2014 Document Reviewed: 08/25/2012 Elsevier Interactive Patient Education 2016 Elsevier Inc.  Esophageal Dilatation Esophageal dilatation is a procedure to open a blocked or narrowed part of the esophagus. The esophagus is the long tube in your throat that carries food and liquid from your mouth to your stomach. The procedure is also called esophageal dilation.  You may need this procedure if you have a buildup of scar tissue in your esophagus that makes it difficult, painful, or even impossible to swallow. This can be caused by gastroesophageal reflux disease (GERD). In rare cases, people need this procedure because they have cancer of the esophagus or a problem with the way food moves through the esophagus. Sometimes you may need to have another dilatation to enlarge the opening of the esophagus gradually. LET [facility] CARE PROVIDER KNOW ABOUT:   Any allergies you have.  All medicines you are taking, including vitamins, herbs, eye drops, creams, and over-the-counter medicines.  Previous problems you or members  of your family have had with the use of anesthetics.  Any blood disorders you have.  Previous surgeries you have had.  Medical conditions you have.  Any antibiotic medicines you are required to take before dental procedures. RISKS AND COMPLICATIONS Generally, this is a safe procedure. However, problems can occur and include:  Bleeding from a tear in the lining of the esophagus.  A hole (perforation) in the esophagus. BEFORE THE PROCEDURE  Do not eat or drink anything after midnight on the night before the procedure or as directed by your health care provider.  Ask your health care provider about changing or stopping your regular medicines. This is especially important if you are  taking diabetes medicines or blood thinners.  Plan to have someone take you home after the procedure. PROCEDURE   You will be given a medicine that makes you relaxed and sleepy (sedative).  A medicine may be sprayed or gargled to numb the back of the throat.  Your health care provider can use various instruments to do an esophageal dilatation. During the procedure, the instrument used will be placed in your mouth and passed down into your esophagus. Options include:  Simple dilators. This instrument is carefully placed in the esophagus to stretch it.  Guided wire bougies. In this method, a flexible tube (endoscope) is used to insert a wire into the esophagus. The dilator is passed over this wire to enlarge the esophagus. Then the wire is removed.  Balloon dilators. An endoscope with a small balloon at the end is passed down into the esophagus. Inflating the balloon gently stretches the esophagus and opens it up. AFTER THE PROCEDURE  Your blood pressure, heart rate, breathing rate, and blood oxygen level will be monitored often until the medicines you were given have worn off.  Your throat may feel slightly sore and will probably still feel numb. This will improve slowly over time.  You will not be allowed to eat or drink until the throat numbness has resolved.  If this is a same-day procedure, you may be allowed to go home once you have been able to drink, urinate, and sit on the edge of the bed without nausea or dizziness.  If this is a same-day procedure, you should have a friend or family member with you for the next 24 hours after the procedure.   This information is not intended to replace advice given to you by your health care provider. Make sure you discuss any questions you have with your health care provider.   Document Released: 11/13/2005 Document Revised: 10/13/2014 Document Reviewed: 02/01/2014 Elsevier Interactive Patient Education [employer].

## 2015-08-17 NOTE — Transfer of Care (Signed)
Immediate Anesthesia Transfer of Care Note  Patient: James [name]  Procedure(s) Performed: Procedure(s): ESOPHAGOGASTRODUODENOSCOPY (EGD) WITH PROPOFOL (N/A)  Patient Location: Endoscopy Unit  Anesthesia Type:MAC  Level of Consciousness: awake, alert , oriented and patient cooperative  Airway & Oxygen Therapy: Patient Spontanous Breathing  Post-op Assessment: Report given to RN, Post -op Vital signs reviewed and stable and Patient moving all extremities X 4  Post vital signs: Reviewed and stable  Last Vitals:  Filed Vitals:   08/17/15 1157  BP: 127/80  Pulse: 68  Temp: 36.5 C  Resp: 18    Complications: No apparent anesthesia complications

## 2015-08-17 NOTE — Op Note (Signed)
[facility] [place], [id number]   ENDOSCOPY PROCEDURE REPORT  PATIENT: James [name], James [name]  MR#: [id number] BIRTHDATE: 1946/09/13 , 73  yrs. old GENDER: male ENDOSCOPIST: Gatha Mayer, MD, [facility] PROCEDURE DATE:  08/17/2015 PROCEDURE:  EGD w/ balloon dilation ASA CLASS:     Class II INDICATIONS:  dysphagia and therapeutic procedure. dilation MEDICATIONS: Monitored anesthesia care and Per Anesthesia TOPICAL ANESTHETIC: Cetacaine Spray  DESCRIPTION OF PROCEDURE: After the risks benefits and alternatives of the procedure were thoroughly explained, informed consent was obtained.  The Pentax Gastroscope [id number] endoscope was introduced through the mouth and advanced to the second portion of the duodenum , Without limitations.  The instrument was slowly withdrawn as the mucosa was fully examined.    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  2) Threaded with wire-guided balloon under fluoro and dilated 8-12 mm using multiple balloons. 3) Scoe then passed revealing a friable stricture, otherwise normal post-operatvie anatomy into D2. Retroflexed views revealed no abnormalities.     The scope was then withdrawn from the patient and the procedure completed.  COMPLICATIONS: There were no immediate complications.  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 2) Threaded with wire-guided balloon under fluoro and dilated 8-12 mm using multiple balloons. 3) Scoe then passed revealing a friable stricture, otherwise normal post-operatvie anatomy into D2  RECOMMENDATIONS: 1.  PPI bid omeprazole 40 mg capsules - open into applesauce if needed - will use OTYC 20mg  x 2 bid until Rx arrives 2.  Liquids and soft foods like noodles, applesauce today then try dysphagia 2 diet but only ground meats tomorrow 3.  Call my office with an update on 11/14  re: swallowing and will then schedule a repeat dilation which I think will be needed either next week or week after.    eSigned:  Gatha Mayer, MD, [facility] 08/17/2015 3:26 PM    [id number] Barry Dienes, MD

## 2015-08-17 NOTE — Anesthesia Postprocedure Evaluation (Signed)
  Anesthesia Post-op Note  Patient: James [name]  Procedure(s) Performed: Procedure(s) (LRB): ESOPHAGOGASTRODUODENOSCOPY (EGD) WITH PROPOFOL (N/A)  Patient Location: PACU  Anesthesia Type: MAC  Level of Consciousness: awake and alert   Airway and Oxygen Therapy: Patient Spontanous Breathing  Post-op Pain: mild  Post-op Assessment: Post-op Vital signs reviewed, Patient's Cardiovascular Status Stable, Respiratory Function Stable, Patent Airway and No signs of Nausea or vomiting  Last Vitals:  Filed Vitals:   08/17/15 1445  BP: 141/75  Pulse: 62  Temp:   Resp: 18    Post-op Vital Signs: stable   Complications: No apparent anesthesia complications

## 2015-08-20 ENCOUNTER — Encounter (HOSPITAL_COMMUNITY): Payer: Self-pay | Admitting: Internal Medicine

## 2015-08-20 ENCOUNTER — Telehealth: Payer: Self-pay | Admitting: Internal Medicine

## 2015-08-20 ENCOUNTER — Telehealth: Payer: Self-pay

## 2015-08-20 DIAGNOSIS — K222 Esophageal obstruction: Secondary | ICD-10-CM

## 2015-08-20 NOTE — Telephone Encounter (Signed)
[employer] called to let us know that the omeprazole has been approved for a year or thru 08/19/2016, patient informed by them and I notified the pharmacy.

## 2015-08-20 NOTE — Telephone Encounter (Signed)
Filled out quantity limit exception form for omeprazole 40mg  capsules twice a day dosing. Dx: Gastric cancer C16.9, Esophageal reflux K21.9  Faxed form to # [phone] and will await reply.

## 2015-08-20 NOTE — Telephone Encounter (Signed)
Great news Please schedule for 0730 wed 11/16 EGD balloon dilation - need to upsize it

## 2015-08-20 NOTE — Telephone Encounter (Signed)
Patient will come to sign paperwork today for procedure on 08/22/15.

## 2015-08-22 ENCOUNTER — Encounter: Payer: Self-pay | Admitting: Internal Medicine

## 2015-08-22 ENCOUNTER — Ambulatory Visit (AMBULATORY_SURGERY_CENTER): Payer: Medicare Other | Admitting: Internal Medicine

## 2015-08-22 VITALS — BP 108/59 | HR 58 | Temp 96.9°F | Resp 18 | Ht 67.0 in | Wt 170.0 lb

## 2015-08-22 DIAGNOSIS — K222 Esophageal obstruction: Secondary | ICD-10-CM

## 2015-08-22 MED ORDER — SODIUM CHLORIDE 0.9 % IV SOLN: 500.0000 mL | INTRAVENOUS | Status: DC

## 2015-08-22 NOTE — Progress Notes (Signed)
Called to room to assist during endoscopic procedure.  Patient ID and intended procedure confirmed with present staff. Received instructions for my participation in the procedure from the performing physician.  

## 2015-08-22 NOTE — Progress Notes (Signed)
0/10 PAIN AT PRESENT. 0836, HOB ELEVATED 30 DEGREES, STILL 0/10 PAIN.

## 2015-08-22 NOTE — Progress Notes (Signed)
Patient awakening,vss,report to rn 

## 2015-08-22 NOTE — Patient Instructions (Addendum)
   I dilated to 15.5 mm tis time. I think we should do this again in 1 week - same time same place.  I appreciate the opportunity to care for you. Gatha Mayer, MD, FACG   YOU HAD AN ENDOSCOPIC PROCEDURE TODAY AT [facility]:   Refer to the procedure report that was given to you for any specific questions about what was found during the examination.  If the procedure report does not answer your questions, please call your gastroenterologist to clarify.  If you requested that your care partner not be given the details of your procedure findings, then the procedure report has been included in a sealed envelope for you to review at your convenience later.  YOU SHOULD EXPECT: Some feelings of bloating in the abdomen. Passage of more gas than usual.  Walking can help get rid of the air that was put into your GI tract during the procedure and reduce the bloating. If you had a lower endoscopy (such as a colonoscopy or flexible sigmoidoscopy) you may notice spotting of blood in your stool or on the toilet paper. If you underwent a bowel prep for your procedure, you may not have a normal bowel movement for a few days.  Please Note:  You might notice some irritation and congestion in your nose or some drainage.  This is from the oxygen used during your procedure.  There is no need for concern and it should clear up in a day or so.  SYMPTOMS TO REPORT IMMEDIATELY:   Following upper endoscopy (EGD)  Vomiting of blood or coffee ground material  New chest pain or pain under the shoulder blades  Painful or persistently difficult swallowing  New shortness of breath  Fever of 100F or higher  Black, tarry-looking stools  For urgent or emergent issues, a gastroenterologist can be reached at any hour by calling [phone].   DIET: NOTHING TO EAT OR DRINK UNTIL 9;00 AM. 9;00 UNTIL 10;00 ONLY CLEAR LIQUIDS. AFTER 10;00 AM UNTIL THE MORNING SOFT FOODS. RESUME YOUR DIET IN  AM.  ACTIVITY:  You should plan to take it easy for the rest of today and you should NOT DRIVE or use heavy machinery until tomorrow (because of the sedation medicines used during the test).    FOLLOW UP: Our staff will call the number listed on your records the next business day following your procedure to check on you and address any questions or concerns that you may have regarding the information given to you following your procedure. If we do not reach you, we will leave a message.  However, if you are feeling well and you are not experiencing any problems, there is no need to return our call.  We will assume that you have returned to your regular daily activities without incident.  If any biopsies were taken you will be contacted by phone or by letter within the next 1-3 weeks.  Please call us at [phone] if you have not heard about the biopsies in 3 weeks.    SIGNATURES/CONFIDENTIALITY: You and/or your care partner have signed paperwork which will be entered into your electronic medical record.  These signatures attest to the fact that that the information above on your After Visit Summary has been reviewed and is understood.  Full responsibility of the confidentiality of this discharge information lies with you and/or your care-partner.

## 2015-08-22 NOTE — Progress Notes (Signed)
EGD SCHEDULED , INSTRUCTIONS GIVEN. PATIENT COMPLAINS OF RLQ PAIN, 5/10. PATIENT ABLE TO PASS GAS. [id number] 3/10, PATIENT POSITIONED ON RT LATERAL.

## 2015-08-22 NOTE — Progress Notes (Signed)
[id number] RECHECKED BY DR. Carlean Purl, PATIENT OKED FOR DISCHARGE.

## 2015-08-22 NOTE — Op Note (Signed)
[place]  [address] [place], [id number]   ENDOSCOPY PROCEDURE REPORT  PATIENT: James [name], James [name]  MR#: [id number] BIRTHDATE: November 07, 1945 , 25  yrs. old GENDER: male ENDOSCOPIST: Gatha Mayer, MD, [facility] PROCEDURE DATE:  08/22/2015 PROCEDURE:  Esophagoscopy   + balloon dilation < 30 mm ASA CLASS:     Class II INDICATIONS:  therapeutic procedure and dysphagia. MEDICATIONS: Propofol 300 mg IV and Monitored anesthesia care TOPICAL ANESTHETIC: none  DESCRIPTION OF PROCEDURE: After the risks benefits and alternatives of the procedure were thoroughly explained, informed consent was obtained.  The LB [id number] endoscope was introduced through the mouth and advanced to the second portion of the duodenum , Without limitations.  The instrument was slowly withdrawn as the mucosa was fully examined.    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  Scope passed into proximal stomach remnant after dilation.  Retroflexion was not performed.     The scope was then withdrawn from the patient and the procedure completed.  COMPLICATIONS: There were no immediate complications.  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  Scope passed into proximal stomach remnant after dilation  RECOMMENDATIONS: 1.  Clear liquids until , then soft foods rest of day.  Try dysphagia 3  diet tomorrow. 2.  Return 11/23 0730 for repeat endoscopy and dilation    eSigned:  Gatha Mayer, MD, [facility] 08/22/2015 8:09 AM    CC:[facility] and Dr. Stark Klein

## 2015-08-23 ENCOUNTER — Ambulatory Visit: Payer: Medicare Other | Admitting: Oncology

## 2015-08-23 ENCOUNTER — Ambulatory Visit (HOSPITAL_BASED_OUTPATIENT_CLINIC_OR_DEPARTMENT_OTHER): Payer: Medicare Other

## 2015-08-23 ENCOUNTER — Telehealth: Payer: Self-pay | Admitting: Internal Medicine

## 2015-08-23 ENCOUNTER — Other Ambulatory Visit: Payer: Medicare Other

## 2015-08-23 ENCOUNTER — Telehealth: Payer: Self-pay | Admitting: Oncology

## 2015-08-23 ENCOUNTER — Ambulatory Visit (HOSPITAL_BASED_OUTPATIENT_CLINIC_OR_DEPARTMENT_OTHER): Payer: Medicare Other | Admitting: Oncology

## 2015-08-23 VITALS — BP 144/73 | HR 64 | Temp 98.0°F | Resp 18 | Ht 67.0 in | Wt 176.4 lb

## 2015-08-23 DIAGNOSIS — C169 Malignant neoplasm of stomach, unspecified: Secondary | ICD-10-CM | POA: Diagnosis not present

## 2015-08-23 LAB — CBC WITH DIFFERENTIAL/PLATELET
BASO%: 0.8 % (ref 0.0–2.0)
Basophils Absolute: 0 10*3/uL (ref 0.0–0.1)
EOS ABS: 0.3 10*3/uL (ref 0.0–0.5)
EOS%: 5.7 % (ref 0.0–7.0)
HEMATOCRIT: 36.7 % — AB (ref 38.4–49.9)
HGB: 11.2 g/dL — ABNORMAL LOW (ref 13.0–17.1)
LYMPH#: 1.6 10*3/uL (ref 0.9–3.3)
LYMPH%: 34.8 % (ref 14.0–49.0)
MCH: 23.5 pg — ABNORMAL LOW (ref 27.2–33.4)
MCHC: 30.6 g/dL — ABNORMAL LOW (ref 32.0–36.0)
MCV: 76.9 fL — AB (ref 79.3–98.0)
MONO#: 0.5 10*3/uL (ref 0.1–0.9)
MONO%: 11.1 % (ref 0.0–14.0)
NEUT%: 47.6 % (ref 39.0–75.0)
NEUTROS ABS: 2.1 10*3/uL (ref 1.5–6.5)
PLATELETS: 162 10*3/uL (ref 140–400)
RBC: 4.78 10*6/uL (ref 4.20–5.82)
RDW: 23.7 % — ABNORMAL HIGH (ref 11.0–14.6)
WBC: 4.5 10*3/uL (ref 4.0–10.3)

## 2015-08-23 LAB — COMPREHENSIVE METABOLIC PANEL (CC13)
ALBUMIN: 3.4 g/dL — AB (ref 3.5–5.0)
ALK PHOS: 78 U/L (ref 40–150)
ALT: <9 U/L (ref 0–55)
AST: 16 U/L (ref 5–34)
Anion Gap: 7 mEq/L (ref 3–11)
BUN: 12.9 mg/dL (ref 7.0–26.0)
CALCIUM: 9.4 mg/dL (ref 8.4–10.4)
CHLORIDE: 107 meq/L (ref 98–109)
CO2: 28 mEq/L (ref 22–29)
Creatinine: 0.7 mg/dL (ref 0.7–1.3)
GLUCOSE: 100 mg/dL (ref 70–140)
POTASSIUM: 4.4 meq/L (ref 3.5–5.1)
SODIUM: 142 meq/L (ref 136–145)
Total Bilirubin: 0.31 mg/dL (ref 0.20–1.20)
Total Protein: 7 g/dL (ref 6.4–8.3)

## 2015-08-23 NOTE — Progress Notes (Signed)
  [facility] OFFICE PROGRESS NOTE   Diagnosis: Gastric cancer  INTERVAL HISTORY:   James [name] returns as scheduled. He continues tube feedings. He developed solid and liquid dysphagia beginning approximately 2 weeks ago. He was taken to an endoscopy by Dr. Carlean Purl 08/22/2015 and was found to have a stricture at the GE junction. The stricture was dilated. He reports improvement in the dysphagia. He is being scheduled for a repeat dilation next week.  The midline abdominal wound is healing.  Objective:  Vital signs in last 24 hours:  Blood pressure 144/73, pulse 64, temperature 98 F (36.7 C), temperature source Oral, resp. rate 18, height 5\' 7"  (1.702 m), weight 176 lb 6.4 oz (80.015 kg), SpO2 100 %.    Resp: Lungs clear bilaterally Cardio: Regular rate and rhythm GI: No hepatomegaly, left upper quadrant jejunostomy feeding tube site without evidence of infection, 1.5 cm superficial opening at the upper aspect of the midline wound without surrounding erythema Vascular: No leg edema   Lab Results:  Lab Results  Component Value Date   WBC 4.5 08/23/2015   HGB 11.2* 08/23/2015   HCT 36.7* 08/23/2015   MCV 76.9* 08/23/2015   PLT 162 08/23/2015   NEUTROABS 2.1 08/23/2015     Medications: I have reviewed the patient's current medications.  Assessment/Plan: 1. Gastric cancer-adenocarcinoma of the gastric fundus/cardia  Staging CT scans 06/07/2015 with no evidence of distant metastatic disease, tiny nonspecific pulmonary nodules and liver lesions-likely benign  Subtotal gastrectomy and placement of a jejunostomy feeding tube 07/18/2015 for a T2,N0 tumor with negative surgical margins 2. Microcytic anemia-likely iron deficiency anemia secondary to #1  3. Vitamin B-12 deficiency -now on vitamin B-12 replacement  4. 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  Microcytic anemia-likely iron deficiency anemia secondary to bleeding from the gastric tumor     Disposition:  James [name] appears well. He is now tolerating a diet after undergoing an esophagus dilatation procedure. He will see Dr. Barry Dienes within the next few weeks to discuss removal of the feeding tube.  I discussed adjuvant treatment options with James [name] and his wife. He understands there is a chance he has undergone curative surgery, but he is at significant risk of developing recurrent disease over the next 5 years. There is no absolute indication for adjuvant therapy in his case. I discussed the case with the GI oncology service at [facility] and they recommend adjuvant chemotherapy/radiation in this setting.  I recommend adjuvant 5-FU/leucovorin to be followed by concurrent radiation and Xeloda. I reviewed the potential toxicities associated with 5-FU/leucovorin including the chance for nausea/vomiting, mucositis, alopecia, diarrhea, and hematologic toxicity. We discussed the rash, hyperpigmentation, and hand/foot syndrome associated with 5-fluorouracil. James [name] attended a chemotherapy teaching class today. He agrees to proceed with adjuvant therapy. He will be referred to radiation oncology to consider radiation as part of the adjuvant treatment plan.  The upper abdominal wound has almost completely healed. He will let us know if this has not healed prior to beginning chemotherapy next week.  He will follow-up with Dr. Carlean Purl for another esophagus dilatation. He will see Dr. Barry Dienes to discuss removal of the jejunostomy tube.  Betsy Coder, MD  08/23/2015  4:05 PM

## 2015-08-23 NOTE — Telephone Encounter (Signed)
Gave and printd appt sched and avs for pt for NOV and DEC  °

## 2015-08-23 NOTE — Telephone Encounter (Signed)
°  Follow up Call-  Call back number 08/22/2015 06/05/2015  Post procedure Call Back phone  # [phone]  Permission to leave phone message Yes Yes     Patient questions:  Do you have a fever, pain , or abdominal swelling? No. Pain Score  0 *  Have you tolerated food without any problems? Yes.    Have you been able to return to your normal activities? Yes.    Do you have any questions about your discharge instructions: Diet   No. Medications  No. Follow up visit  No.  Do you have questions or concerns about your Care? No.  Actions: * If pain score is 4 or above: No action needed, pain <4.

## 2015-08-24 ENCOUNTER — Telehealth: Payer: Self-pay | Admitting: *Deleted

## 2015-08-24 ENCOUNTER — Telehealth: Payer: Self-pay | Admitting: Oncology

## 2015-08-24 ENCOUNTER — Encounter: Payer: Self-pay | Admitting: *Deleted

## 2015-08-24 DIAGNOSIS — C169 Malignant neoplasm of stomach, unspecified: Secondary | ICD-10-CM

## 2015-08-24 NOTE — Telephone Encounter (Signed)
Per staff message and POF I have scheduled appts. Advised scheduler of appts. JMW  

## 2015-08-24 NOTE — Progress Notes (Signed)
Oncology Nurse Navigator Documentation  Oncology Nurse Navigator Flowsheets 08/23/2015  Referral date to RadOnc/MedOnc -  Navigator Encounter Type 3 month f/U  Patient Visit Type Medonc  Treatment Phase Treatment planning (surgery completed)  Barriers/Navigation Needs Family concerns--chemo teaching  Education -  Interventions Education Method--worked him in for today's chemo class  Coordination of Care Referral to Dr. Lisbeth Renshaw for tx  In 1 month  Education Method Verbal  Support Groups/Services GI  Time Spent with Patient 30  Still has J-feeding tube with feeding at night (12 hours) at 40-60 cc/hr. Trying to wean down since po intake has improved.

## 2015-08-24 NOTE — Telephone Encounter (Signed)
TC received from pt's wife confirming that she and her husband understood vm message correctly. Received ferrous sulfate instructions with wife and she verbalized understanding.  No further issues noted.

## 2015-08-24 NOTE — Telephone Encounter (Signed)
s.w pt and advised on 11.22 appt.[name] KitchenMarland KitchenMarland KitchenMarland Kitchenpt ok and aware

## 2015-08-24 NOTE — Telephone Encounter (Signed)
-----   Message from Ladell Pier, MD sent at 08/23/2015  6:04 PM EST ----- Please call patient, appears to have iron deficiency, start ferrous sulfate 325mg  daily

## 2015-08-26 ENCOUNTER — Other Ambulatory Visit: Payer: Self-pay | Admitting: Oncology

## 2015-08-28 ENCOUNTER — Telehealth: Payer: Self-pay | Admitting: Oncology

## 2015-08-28 ENCOUNTER — Encounter: Payer: Self-pay | Admitting: Radiation Oncology

## 2015-08-28 ENCOUNTER — Ambulatory Visit (HOSPITAL_BASED_OUTPATIENT_CLINIC_OR_DEPARTMENT_OTHER): Payer: Medicare Other

## 2015-08-28 ENCOUNTER — Ambulatory Visit: Payer: Medicare Other | Admitting: Nutrition

## 2015-08-28 VITALS — BP 139/80 | HR 65 | Temp 98.1°F | Resp 18

## 2015-08-28 DIAGNOSIS — C169 Malignant neoplasm of stomach, unspecified: Secondary | ICD-10-CM

## 2015-08-28 DIAGNOSIS — Z5111 Encounter for antineoplastic chemotherapy: Secondary | ICD-10-CM | POA: Diagnosis not present

## 2015-08-28 MED ORDER — PROCHLORPERAZINE MALEATE 10 MG PO TABS
10.0000 mg | ORAL_TABLET | Freq: Once | ORAL | Status: AC
Start: 2015-08-28 — End: 2015-08-28
  Administered 2015-08-28: 10 mg via ORAL

## 2015-08-28 MED ORDER — PROCHLORPERAZINE MALEATE 10 MG PO TABS
ORAL_TABLET | ORAL | Status: AC
  Filled 2015-08-28: qty 1

## 2015-08-28 MED ORDER — FLUOROURACIL CHEMO INJECTION 2.5 GM/50ML
600.0000 mg/m2 | Freq: Once | INTRAVENOUS | Status: AC
  Administered 2015-08-28: 1150 mg via INTRAVENOUS
  Filled 2015-08-28: qty 23

## 2015-08-28 MED ORDER — SODIUM CHLORIDE 0.9 % IV SOLN
Freq: Once | INTRAVENOUS | Status: AC
  Administered 2015-08-28: 11:00:00 via INTRAVENOUS

## 2015-08-28 MED ORDER — LEUCOVORIN CALCIUM INJECTION 350 MG
500.0000 mg/m2 | Freq: Once | INTRAVENOUS | Status: AC
  Administered 2015-08-28: 970 mg via INTRAVENOUS
  Filled 2015-08-28: qty 48.5

## 2015-08-28 NOTE — Telephone Encounter (Signed)
Gave and printd new sched....the patient wanted to moved chemo to thursdays

## 2015-08-28 NOTE — Progress Notes (Signed)
Nutrition follow-up completed with patient and wife, during chemotherapy for gastric mass. Patient is status post subtotal gastrectomy with jejunostomy feeding tube placement. Patient is utilizing Jevity 1.5 at 40 mL an hour for 12 hours daily via feeding jejunostomy. (2 cans) Patient denies difficulty with tube feeding at this time. Reports some difficulty swallowing and is having his esophagus stretched for the third time. Weight has increased and was documented as 176.4 pounds November 17 increased from 170 pounds.   Patient weighed 190 pounds in September when seen in GI clinic. Jevity 1.5, 2 cans daily provides 710 cal, 30.2 g protein, 360 mL free water.  Nutrition diagnosis:  Food and nutrition related knowledge deficit has continued.  Intervention: Educated patient to continue soft moist foods as tolerated in small frequent meals and snacks to promote weight gain and continued healing. Offered option of changing nocturnal feedings to 60 mL an hour for 8 hours to allow a shorter amount of time on the feeding pump. Questions were answered.  Teach back method was used.  Monitoring, evaluation, goals: Patient will tolerate adequate oral intake to minimize weight loss.  Next visit: Thursday, December 1, during infusion.  **Disclaimer: This note was dictated with voice recognition software. Similar sounding words can inadvertently be transcribed and this note may contain transcription errors which may not have been corrected upon publication of note.**

## 2015-08-28 NOTE — Progress Notes (Signed)
Line flushed before and after Adrucil push with NS and blood return noted before and after Adrucil push.

## 2015-08-28 NOTE — Progress Notes (Addendum)
GI Location of Tumor / Histology: Gastric cancer- Proximal Stomach-T2NO  [name] presented  months ago with symptoms of: solid and liquid dysphasia approx 2 weeks   Biopsies of  (if applicable) revealed: Diagnosis 07/18/2015: Stomach, resection for tumor - INVASIVE ADENOCARCINOMA EXTENDING INTO MUSCULARIS PROPRIA.- MARGINS NOT INVOLVED.- SEVENTEEN BENIGN LYMPH NODES (0/17).- BENIGN SUBSEROSAL SPINDLE CELL NODULE, 0.6 CM.  Past/Anticipated interventions by surgeon, if any: Subtotal gastrectomy and placement jejunostomy feeding tube 07/18/15 Dr. Carlean Purl 08/22/15 EGD=stricture at [facility], dilated and repeat 08/29/15  To see Dr. Barry Dienes  Next few weeks to discus removal of feeding tube  Past/Anticipated interventions by medical oncology, if any:Dr. Benay Spice  To start chemotherapy 08/28/15 5-FU, follow up lab and Dr. Benay Spice 09/05/15  Weight changes, if any: feedings via jejunostomy  Feeding tube , Ernestene Kiel appt 08/28/15, Jevity 1.5 cal can  8 hours at nighjt, free water after  Bowel/Bladder complaints, if any: diarrhea yesterday resolved on its own, no bladder problems Nausea / Vomiting, if any: NO  Pain issues, if any:  NO  Any blood per rectum:   NO  SAFETY ISSUES:  Prior radiation? YES, Prostate 11/29/14-01/23/15=7800 cGy/40 sessions,seminal vesicles 5600cGy/40sessions Dr. Larina Earthly   Pacemaker/ICD? NO Is the patient on methotrexate? NO  Current Details/CO'S: Married,  Former smoker cigarettes.5ppd x5 years,quit 10/07/67, No drug use, drinks alcohol,rare, no smokeless tobacco,  Brothr Multiple myeloma, mother deceased  In her 73's Cirrhosis ETOH abuse, Father DM<MI<Liver disease r/t alcohol,  Sister DM  Allergies:Codeine-Nausea BP 118/67 mmHg  Pulse 76  Temp(Src) 98.4 F (36.9 C) (Oral)  Resp 20  Ht 5' 7"  (1.702 m)  Wt 177 lb (80.287 kg)  BMI 27.72 kg/m2  Wt Readings from Last 3 Encounters:  09/03/15 177 lb (80.287 kg)  08/29/15 176 lb (79.833 kg)  08/23/15 176  lb 6.4 oz (80.015 kg)

## 2015-08-28 NOTE — Patient Instructions (Signed)
[facility] Discharge Instructions for Patients Receiving Chemotherapy  Today you received the following chemotherapy agents: Leucovorin and Adrucil   To help prevent nausea and vomiting after your treatment, we encourage you to take your nausea medication as directed.    If you develop nausea and vomiting that is not controlled by your nausea medication, call the clinic.   BELOW ARE SYMPTOMS THAT SHOULD BE REPORTED IMMEDIATELY:  *FEVER GREATER THAN 100.5 F  *CHILLS WITH OR WITHOUT FEVER  NAUSEA AND VOMITING THAT IS NOT CONTROLLED WITH YOUR NAUSEA MEDICATION  *UNUSUAL SHORTNESS OF BREATH  *UNUSUAL BRUISING OR BLEEDING  TENDERNESS IN MOUTH AND THROAT WITH OR WITHOUT PRESENCE OF ULCERS  *URINARY PROBLEMS  *BOWEL PROBLEMS  UNUSUAL RASH Items with * indicate a potential emergency and should be followed up as soon as possible.  Feel free to call the clinic you have any questions or concerns. The clinic phone number is [phone].  Please show the [facility] at check-in to the Emergency Department and triage nurse.    Leucovorin injection What is this medicine? LEUCOVORIN (loo koe VOR in) is used to prevent or treat the harmful effects of some medicines. This medicine is used to treat anemia caused by a low amount of folic acid in the body. It is also used with 5-fluorouracil (5-FU) to treat colon cancer. This medicine may be used for other purposes; ask your health care provider or pharmacist if you have questions. What should I tell my health care provider before I take this medicine? They need to know if you have any of these conditions: -anemia from low levels of vitamin B-12 in the blood -an unusual or allergic reaction to leucovorin, folic acid, other medicines, foods, dyes, or preservatives -pregnant or trying to get pregnant -breast-feeding How should I use this medicine? This medicine is for injection into a muscle or into a vein. It is given  by a health care professional in a hospital or clinic setting. Talk to your pediatrician regarding the use of this medicine in children. Special care may be needed. Overdosage: If you think you have taken too much of this medicine contact a poison control center or emergency room at once. NOTE: This medicine is only for you. Do not share this medicine with others. What if I miss a dose? This does not apply. What may interact with this medicine? -capecitabine -fluorouracil -phenobarbital -phenytoin -primidone -trimethoprim-sulfamethoxazole This list may not describe all possible interactions. Give your health care provider a list of all the medicines, herbs, non-prescription drugs, or dietary supplements you use. Also tell them if you smoke, drink alcohol, or use illegal drugs. Some items may interact with your medicine. What should I watch for while using this medicine? Your condition will be monitored carefully while you are receiving this medicine. This medicine may increase the side effects of 5-fluorouracil, 5-FU. Tell your doctor or health care professional if you have diarrhea or mouth sores that do not get better or that get worse. What side effects may I notice from receiving this medicine? Side effects that you should report to your doctor or health care professional as soon as possible: -allergic reactions like skin rash, itching or hives, swelling of the face, lips, or tongue -breathing problems -fever, infection -mouth sores -unusual bleeding or bruising -unusually weak or tired Side effects that usually do not require medical attention (report to your doctor or health care professional if they continue or are bothersome): -constipation or diarrhea -loss  of appetite -nausea, vomiting This list may not describe all possible side effects. Call your doctor for medical advice about side effects. You may report side effects to FDA at 1-800-FDA-1088. Where should I keep my  medicine? This drug is given in a hospital or clinic and will not be stored at home. NOTE: This sheet is a summary. It may not cover all possible information. If you have questions about this medicine, talk to your doctor, pharmacist, or health care provider.    2016, Elsevier/Gold Standard. (2008-03-28 16:50:29)    Fluorouracil, 5-FU injection What is this medicine? FLUOROURACIL, 5-FU (flure oh YOOR a sil) is a chemotherapy drug. It slows the growth of cancer cells. This medicine is used to treat many types of cancer like breast cancer, colon or rectal cancer, pancreatic cancer, and stomach cancer. This medicine may be used for other purposes; ask your health care provider or pharmacist if you have questions. What should I tell my health care provider before I take this medicine? They need to know if you have any of these conditions: -blood disorders -dihydropyrimidine dehydrogenase (DPD) deficiency -infection (especially a virus infection such as chickenpox, cold sores, or herpes) -kidney disease -liver disease -malnourished, poor nutrition -recent or ongoing radiation therapy -an unusual or allergic reaction to fluorouracil, other chemotherapy, other medicines, foods, dyes, or preservatives -pregnant or trying to get pregnant -breast-feeding How should I use this medicine? This drug is given as an infusion or injection into a vein. It is administered in a hospital or clinic by a specially trained health care professional. Talk to your pediatrician regarding the use of this medicine in children. Special care may be needed. Overdosage: If you think you have taken too much of this medicine contact a poison control center or emergency room at once. NOTE: This medicine is only for you. Do not share this medicine with others. What if I miss a dose? It is important not to miss your dose. Call your doctor or health care professional if you are unable to keep an appointment. What may  interact with this medicine? -allopurinol -cimetidine -dapsone -digoxin -hydroxyurea -leucovorin -levamisole -medicines for seizures like ethotoin, fosphenytoin, phenytoin -medicines to increase blood counts like filgrastim, pegfilgrastim, sargramostim -medicines that treat or prevent blood clots like warfarin, enoxaparin, and dalteparin -methotrexate -metronidazole -pyrimethamine -some other chemotherapy drugs like busulfan, cisplatin, estramustine, vinblastine -trimethoprim -trimetrexate -vaccines Talk to your doctor or health care professional before taking any of these medicines: -acetaminophen -aspirin -ibuprofen -ketoprofen -naproxen This list may not describe all possible interactions. Give your health care provider a list of all the medicines, herbs, non-prescription drugs, or dietary supplements you use. Also tell them if you smoke, drink alcohol, or use illegal drugs. Some items may interact with your medicine. What should I watch for while using this medicine? Visit your doctor for checks on your progress. This drug may make you feel generally unwell. This is not uncommon, as chemotherapy can affect healthy cells as well as cancer cells. Report any side effects. Continue your course of treatment even though you feel ill unless your doctor tells you to stop. In some cases, you may be given additional medicines to help with side effects. Follow all directions for their use. Call your doctor or health care professional for advice if you get a fever, chills or sore throat, or other symptoms of a cold or flu. Do not treat yourself. This drug decreases your body's ability to fight infections. Try to avoid being around people who  are sick. This medicine may increase your risk to bruise or bleed. Call your doctor or health care professional if you notice any unusual bleeding. Be careful brushing and flossing your teeth or using a toothpick because you may get an infection or bleed  more easily. If you have any dental work done, tell your dentist you are receiving this medicine. Avoid taking products that contain aspirin, acetaminophen, ibuprofen, naproxen, or ketoprofen unless instructed by your doctor. These medicines may hide a fever. Do not become pregnant while taking this medicine. Women should inform their doctor if they wish to become pregnant or think they might be pregnant. There is a potential for serious side effects to an unborn child. Talk to your health care professional or pharmacist for more information. Do not breast-feed an infant while taking this medicine. Men should inform their doctor if they wish to father a child. This medicine may lower sperm counts. Do not treat diarrhea with over the counter products. Contact your doctor if you have diarrhea that lasts more than 2 days or if it is severe and watery. This medicine can make you more sensitive to the sun. Keep out of the sun. If you cannot avoid being in the sun, wear protective clothing and use sunscreen. Do not use sun lamps or tanning beds/booths. What side effects may I notice from receiving this medicine? Side effects that you should report to your doctor or health care professional as soon as possible: -allergic reactions like skin rash, itching or hives, swelling of the face, lips, or tongue -low blood counts - this medicine may decrease the number of white blood cells, red blood cells and platelets. You may be at increased risk for infections and bleeding. -signs of infection - fever or chills, cough, sore throat, pain or difficulty passing urine -signs of decreased platelets or bleeding - bruising, pinpoint red spots on the skin, black, tarry stools, blood in the urine -signs of decreased red blood cells - unusually weak or tired, fainting spells, lightheadedness -breathing problems -changes in vision -chest pain -mouth sores -nausea and vomiting -pain, swelling, redness at site where  injected -pain, tingling, numbness in the hands or feet -redness, swelling, or sores on hands or feet -stomach pain -unusual bleeding Side effects that usually do not require medical attention (report to your doctor or health care professional if they continue or are bothersome): -changes in finger or toe nails -diarrhea -dry or itchy skin -hair loss -headache -loss of appetite -sensitivity of eyes to the light -stomach upset -unusually teary eyes This list may not describe all possible side effects. Call your doctor for medical advice about side effects. You may report side effects to FDA at 1-800-FDA-1088. Where should I keep my medicine? This drug is given in a hospital or clinic and will not be stored at home. NOTE: This sheet is a summary. It may not cover all possible information. If you have questions about this medicine, talk to your doctor, pharmacist, or health care provider.    2016, Elsevier/Gold Standard. (2008-01-26 13:53:16)

## 2015-08-29 ENCOUNTER — Encounter: Payer: Self-pay | Admitting: Internal Medicine

## 2015-08-29 ENCOUNTER — Ambulatory Visit (AMBULATORY_SURGERY_CENTER): Payer: Medicare Other | Admitting: Internal Medicine

## 2015-08-29 VITALS — BP 109/63 | HR 64 | Temp 97.4°F | Resp 19 | Ht 67.0 in | Wt 176.0 lb

## 2015-08-29 DIAGNOSIS — R1310 Dysphagia, unspecified: Secondary | ICD-10-CM | POA: Diagnosis not present

## 2015-08-29 DIAGNOSIS — K222 Esophageal obstruction: Secondary | ICD-10-CM | POA: Diagnosis not present

## 2015-08-29 MED ORDER — SODIUM CHLORIDE 0.9 % IV SOLN: 500.0000 mL | INTRAVENOUS | Status: DC

## 2015-08-29 NOTE — Progress Notes (Signed)
Called to room to assist during endoscopic procedure.  Patient ID and intended procedure confirmed with present staff. Received instructions for my participation in the procedure from the performing physician.  

## 2015-08-29 NOTE — Patient Instructions (Addendum)
15.5 mm today. Stay away from salads. Let's see how you do on Dysphagia 3 diet.  I want to hear from you next week to see how its going - I bet we will need to do this again - the other option could be to place a stent in there to keep it open for a few weeks.  Have a nice Thanksgiving and again no raw vegetables except shredded lettuce.  I appreciate the opportunity to care for you. Gatha Mayer, MD, FACG   YOU HAD AN ENDOSCOPIC PROCEDURE TODAY AT [facility]:   Refer to the procedure report that was given to you for any specific questions about what was found during the examination.  If the procedure report does not answer your questions, please call your gastroenterologist to clarify.  If you requested that your care partner not be given the details of your procedure findings, then the procedure report has been included in a sealed envelope for you to review at your convenience later.  YOU SHOULD EXPECT: Some feelings of bloating in the abdomen. Passage of more gas than usual.  Walking can help get rid of the air that was put into your GI tract during the procedure and reduce the bloating. If you had a lower endoscopy (such as a colonoscopy or flexible sigmoidoscopy) you may notice spotting of blood in your stool or on the toilet paper. If you underwent a bowel prep for your procedure, you may not have a normal bowel movement for a few days.  Please Note:  You might notice some irritation and congestion in your nose or some drainage.  This is from the oxygen used during your procedure.  There is no need for concern and it should clear up in a day or so.  SYMPTOMS TO REPORT IMMEDIATELY:    Following upper endoscopy (EGD)  Vomiting of blood or coffee ground material  New chest pain or pain under the shoulder blades  Painful or persistently difficult swallowing  New shortness of breath  Fever of 100F or higher  Black, tarry-looking stools  For urgent or  emergent issues, a gastroenterologist can be reached at any hour by calling [phone].   DIET: Your first meal following the procedure should be a small meal and then it is ok to progress to your normal diet. Heavy or fried foods are harder to digest and may make you feel nauseous or bloated.  Likewise, meals heavy in dairy and vegetables can increase bloating.  Drink plenty of fluids but you should avoid alcoholic beverages for 24 hours.  ACTIVITY:  You should plan to take it easy for the rest of today and you should NOT DRIVE or use heavy machinery until tomorrow (because of the sedation medicines used during the test).    FOLLOW UP: Our staff will call the number listed on your records the next business day following your procedure to check on you and address any questions or concerns that you may have regarding the information given to you following your procedure. If we do not reach you, we will leave a message.  However, if you are feeling well and you are not experiencing any problems, there is no need to return our call.  We will assume that you have returned to your regular daily activities without incident.  If any biopsies were taken you will be contacted by phone or by letter within the next 1-3 weeks.  Please call us at [phone] if  you have not heard about the biopsies in 3 weeks.    SIGNATURES/CONFIDENTIALITY: You and/or your care partner have signed paperwork which will be entered into your electronic medical record.  These signatures attest to the fact that that the information above on your After Visit Summary has been reviewed and is understood.  Full responsibility of the confidentiality of this discharge information lies with you and/or your care-partner.  Clear liquids until 9/AM, then soft food rest of day, dysphagia 3 diet tomorrow. Post dilation diet given for today.

## 2015-08-29 NOTE — Progress Notes (Signed)
No allergies to eggs or soy per pt. Patient states he had Chemo IV yesterday. First time per pt. And denies any problems or concerns today.

## 2015-08-29 NOTE — Op Note (Signed)
[place]  [address] [place], [address]   ENDOSCOPY PROCEDURE REPORT  PATIENT: James [name], James [name]  MR#: [id number] BIRTHDATE: 1946/05/24 , 13  yrs. old GENDER: male ENDOSCOPIST: Gatha Mayer, MD, [facility] PROCEDURE DATE:  08/29/2015 PROCEDURE:  EGD w/ balloon dilation ASA CLASS:     Class III INDICATIONS:  therapeutic procedure and dysphagia. MEDICATIONS: Propofol 350 mg IV and Monitored anesthesia care TOPICAL ANESTHETIC: none  DESCRIPTION OF PROCEDURE: After the risks benefits and alternatives of the procedure were thoroughly explained, informed consent was obtained.  The LB [id number] endoscope was introduced through the mouth and advanced to the second portion of the duodenum , Without limitations.  The instrument was slowly withdrawn as the mucosa was fully examined.    1) post esophagogastrectomy anastomotic stricture again - tight but more open - scope would not pass intitially 2) dilated w/ balloon 11.5-15.5 mm 3) Otherwise normal post-op anatomy into duodenum.  Low air used and suctioning to reduce air trapping.  Retroflexion was not performed. The scope was then withdrawn from the patient and the procedure completed.  COMPLICATIONS: There were no immediate complications.  ENDOSCOPIC IMPRESSION: 1) post esophagogastrectomy anastomotic stricture again - tight but more open - scope would not pass intitially 2) dilated w/ balloon 11.5-15.5 mm 3) Otherwise normal post-op anatomy into duodenum.  Low air used and suctioning to reduce air trapping  RECOMMENDATIONS: 1.  Call me next week with updat - ? repeat dilation vs temporary stent 2.  Clear liquids until 0900 , then soft foods rest of day. dysphagia 3 diet tomorrow.    eSigned:  Gatha Mayer, MD, [facility] 08/29/2015 8:11 AM    CC: Stark Klein,  MD and The Patient

## 2015-08-29 NOTE — Progress Notes (Signed)
To recovery, report to RN, VSS. 

## 2015-08-31 ENCOUNTER — Telehealth: Payer: Self-pay | Admitting: *Deleted

## 2015-08-31 NOTE — Telephone Encounter (Signed)
Left message requesting pt call the office to discuss side effect management.

## 2015-09-02 ENCOUNTER — Other Ambulatory Visit: Payer: Self-pay | Admitting: Oncology

## 2015-09-03 ENCOUNTER — Ambulatory Visit
Admission: RE | Admit: 2015-09-03 | Discharge: 2015-09-03 | Disposition: A | Discharge status: Home / Self Care | Payer: Medicare Other | Source: Ambulatory Visit | Attending: Radiation Oncology | Admitting: Radiation Oncology

## 2015-09-03 ENCOUNTER — Telehealth: Payer: Self-pay | Admitting: Internal Medicine

## 2015-09-03 ENCOUNTER — Telehealth: Payer: Self-pay

## 2015-09-03 ENCOUNTER — Encounter: Payer: Self-pay | Admitting: Radiation Oncology

## 2015-09-03 VITALS — BP 118/67 | HR 76 | Temp 98.4°F | Resp 20 | Ht 67.0 in | Wt 177.0 lb

## 2015-09-03 DIAGNOSIS — C169 Malignant neoplasm of stomach, unspecified: Secondary | ICD-10-CM

## 2015-09-03 DIAGNOSIS — C165 Malignant neoplasm of lesser curvature of stomach, unspecified: Secondary | ICD-10-CM | POA: Insufficient documentation

## 2015-09-03 DIAGNOSIS — Z87891 Personal history of nicotine dependence: Secondary | ICD-10-CM | POA: Insufficient documentation

## 2015-09-03 DIAGNOSIS — Z79899 Other long term (current) drug therapy: Secondary | ICD-10-CM | POA: Insufficient documentation

## 2015-09-03 HISTORY — DX: Allergy, unspecified, initial encounter: T78.40XA

## 2015-09-03 NOTE — Telephone Encounter (Signed)
He is doing ok w/o dysphagia on Dysphagia 3 diet at present.  He will see Dr. Barry Dienes tomorrow - I told him to let me know if dysphagia returns.   As long as swallowing ok - will just observe.

## 2015-09-03 NOTE — Progress Notes (Signed)
Radiation Oncology         [phone] ________________________________  Name: James [name] MRN: [id number]  Date: 09/03/2015  DOB: Feb 05, 1946  CC:[name], MD  Franchot Gallo, MD     REFERRING PHYSICIAN: Franchot Gallo, MD  DIAGNOSIS: The encounter diagnosis was Carcinoma of lesser curvature of stomach ([place]).  HISTORY OF PRESENT ILLNESS::James [name] is a 69 y.o. male who is seen for an initial consultation visit regarding the patient's diagnosis of gastric cancer of the proximal stomach. He completed external beam radiation for treatment of prostate cancer in the spring of this year. After initial improvement and malaise he developed progressive fatigue for the past 2 months with dyspnea on exertion. He was found to have microcytic anemia on a physical at the [facility]. He described heartburn, reflux, and regurgitation of food.   He was referred to Dr. Carlean Purl and was taken to an upper endoscopy on 06/05/2015. A large firm mass was noted at the gastric cardia/fundus was biopsied. A 1 cm sessile distal gastric body polyp was biopsied. The pathology [id number]) confirmed invasive adenocarcinoma involving the gastric cardia mass. The gastric body biopsy returned as a tubular adenoma.  Staging CTs of the chest, abdomen, and pelvis on 06/07/2015 revealed no pathologically enlarged mediastinal or hilar lymph nodes. The esophagus appeared unremarkable. Small pulmonary nodules are nonspecific. No large lesions to suggest metastatic disease. A 1.6 cm cyst was noted in segment 7 of the liver. Several too small to characterize lesions were noted in the liver. The adrenal glands appeared normal. A pedunculated soft tissue mass was noted adjacent to the GE junction extending into the lumen of the stomach. No lymphadenopathy in the abdomen or pelvis. No ascites.  The patient underwent subtotal gastrectomy on 07/18/15. There was no evidence of carcinomatosis. The pathology  [id number]) revealed an invasive adenocarcinoma extending into muscularis propria. The resection margins and 17 lymph nodes were negative. A benign 0.6 cm subserosal spindle cell nodule was noted. The tumor was moderately differentiated. No lymphovascular or perineural invasion. Tumor extended into the submucosa and superficial portions of the muscularis propria. The subserosal spindle cell tumor was positive for CD117 and CD34 consistent with an incidental low-grade gastrointestinal stromal tumor. A feeding tube was placed at this time as well.   Since surgery, the patient required esophageal dilation (stricture at [facility]) twice on 08/22/15 and 08/29/15 after issues with swallowing food. The patient will see Dr.Bylery in the next few weeks to discuss removal of feeding tube.   He was recently diagnosed with a vitamin B-12 deficiency and started on vitamin B-12 replacement.  Currently on chemotherapy since last week.   PREVIOUS RADIATION THERAPY: Yes, Prostate 11/29/14-01/23/15=7800 cGy/40 sessions,seminal vesicles 5600cGy/40sessions Dr. Larina Earthly - The patient states he tolerated previous radiation treatment well.  PAST MEDICAL HISTORY:  has a past medical history of Abnormal EKG; Hyperlipemia; Duodenal ulcer (1972); basal cell carcinoma; S/P radiation therapy (11/29/2014 through 01/23/2015 ); B12 deficiency (06/05/2015); Transfusion history; Anemia; Prostate cancer ([place]) (09/06/14); Skin cancer, basal cell; Cardiac murmur; Asthma; Esophageal reflux; Gastric cancer ([place]) (07/18/15); and Allergy.     PAST SURGICAL HISTORY: Past Surgical History  Procedure Laterality Date  . Trigger finger release Left 1998  . Rotator cuff repair Right     R shoulder, [place]  . Knee arthroscopy Right     Right Knee, [place] [facility]  . Colonoscopy with polypectomy  02/2012    Dr Sharlett Iles  . Prostate biopsy  09/06/14  . Cataract extraction,  bilateral Bilateral     . Vasectomy    . Eus N/A 06/14/2015    Procedure: ESOPHAGEAL ENDOSCOPIC ULTRASOUND (EUS) RADIAL;  Surgeon: Milus Banister, MD;  Location: [facility];  Service: Endoscopy;  Laterality: N/A;  . Laparoscopy N/A 07/18/2015    Procedure: LAPAROSCOPY DIAGNOSTIC;  Surgeon: Stark Klein, MD;  Location: [facility];  Service: General;  Laterality: N/A;  . Laparoscopic gastrectomy N/A 07/18/2015    Procedure: SUB TOTAL GASTRECTOMY;  Surgeon: Stark Klein, MD;  Location: [facility];  Service: General;  Laterality: N/A;  . Laparoscopic gastrostomy N/A 07/18/2015    Procedure: FEEDING TUBE PLACEMENT;  Surgeon: Stark Klein, MD;  Location: [facility];  Service: General;  Laterality: N/A;  . Esophagogastroduodenoscopy (egd) with propofol N/A 08/17/2015    Procedure: ESOPHAGOGASTRODUODENOSCOPY (EGD) WITH PROPOFOL;  Surgeon: Gatha Mayer, MD;  Location: [facility];  Service: Endoscopy;  Laterality: N/A;     FAMILY HISTORY: family history includes Alcohol abuse in his mother; Cirrhosis in his mother; Diabetes in his father and sister; Heart attack (age of onset: 96) in his father; Liver disease in his father; Multiple myeloma in his brother. There is no history of Colon cancer, Stomach cancer, or Stroke.   SOCIAL HISTORY:  reports that he quit smoking about 47 years ago. His smoking use included Cigarettes. He has a 2.5 pack-year smoking history. He has never used smokeless tobacco. He reports that he drinks alcohol. He reports that he does not use illicit drugs.   ALLERGIES: Codeine   MEDICATIONS:  Current Outpatient Prescriptions  Medication Sig Dispense Refill  . cyanocobalamin (,VITAMIN B-12,) 1000 MCG/ML injection 1,000 mcg every 30 (thirty) days.   0  . Nutritional Supplements (FEEDING SUPPLEMENT, JEVITY 1.5 CAL,) LIQD Place 1,000 mLs into feeding tube continuous. Put into J tube via pump at 40cc/h (Patient taking differently: Place 1,000 mLs into feeding tube continuous. Put into J tube via pump at 65cc/h-  12 hours a day) 5000 mL 5  . omeprazole (PRILOSEC) 40 MG capsule Take 1 capsule (40 mg total) by mouth 2 (two) times daily before a meal. 30 MINS BEFORE BREAKFAST/SUPPER 60 capsule 2  . ondansetron (ZOFRAN-ODT) 4 MG disintegrating tablet Take 1 tablet (4 mg total) by mouth every 6 (six) hours as needed for nausea. 20 tablet 0  . pravastatin (PRAVACHOL) 40 MG tablet Take 1 tablet (40 mg total) by mouth daily. HOLD UNTIL SWALLOWING BETTER 90 tablet 3  . Water For Irrigation, Sterile (FREE WATER) SOLN Place 240 mLs into feeding tube 4 (four) times daily. 1000 mL 5   No current facility-administered medications for this encounter.     REVIEW OF SYSTEMS:  A 15 point review of systems is documented in the electronic medical record. This was obtained by the nursing staff. However, I reviewed this with the patient to discuss relevant findings and make appropriate changes.  Pertinent items are noted in HPI.    PHYSICAL EXAM:  height is 5' 7"  (1.702 m) and weight is 177 lb (80.287 kg). His oral temperature is 98.4 F (36.9 C). His blood pressure is 118/67 and his pulse is 76. His respiration is 20.   General: Well-developed, in no acute distress HEENT: Normocephalic, atraumatic Cardiovascular: Regular rate and rhythm Respiratory: Clear to auscultation bilaterally GI: Soft, nontender, normal bowel sounds Extremities: No edema present No palpable cervical, supraclavicular or axillary lymphoadenopathy  ECOG = 1  0 - Asymptomatic (Fully active, able to carry on all predisease activities without restriction)  1 -  Symptomatic but completely ambulatory (Restricted in physically strenuous activity but ambulatory and able to carry out work of a light or sedentary nature. For example, light housework, office work)  2 - Symptomatic, <50% in bed during the day (Ambulatory and capable of all self care but unable to carry out any work activities. Up and about more than 50% of waking hours)  3 - Symptomatic,  >50% in bed, but not bedbound (Capable of only limited self-care, confined to bed or chair 50% or more of waking hours)  4 - Bedbound (Completely disabled. Cannot carry on any self-care. Totally confined to bed or chair)  5 - Death   Eustace Pen MM, Creech RH, Tormey DC, et al. [phone]). "Toxicity and response criteria of the [facility]". [place] Oncol. 5 (6): 649-55  _   LABORATORY DATA:  Lab Results  Component Value Date   WBC 4.5 08/23/2015   HGB 11.2* 08/23/2015   HCT 36.7* 08/23/2015   MCV 76.9* 08/23/2015   PLT 162 08/23/2015   Lab Results  Component Value Date   NA 142 08/23/2015   K 4.4 08/23/2015   CL 101 07/23/2015   CO2 28 08/23/2015   Lab Results  Component Value Date   ALT <9 08/23/2015   AST 16 08/23/2015   ALKPHOS 78 08/23/2015   BILITOT 0.31 08/23/2015     RADIOGRAPHY: Dg Ugi  W/kub  08/15/2015  CLINICAL DATA:  Unable to eat or drink for 5 days status post recent partial gastrectomy for gastric cancer. J-tube in place. EXAM: UPPER GI SERIES WITH KUB TECHNIQUE: After obtaining a scout radiograph a routine upper GI series was performed using thin barium. FLUOROSCOPY TIME:  Radiation Exposure Index (as provided by the fluoroscopic device): If the device does not provide the exposure index: Fluoroscopy Time (in minutes and seconds):  0 minutes 42 seconds Number of Acquired Images:  1 COMPARISON:  CT abdomen pelvis 06/07/2015. FINDINGS: Scout view of the abdomen shows a jejunostomy tube tip projecting over the right lower quadrant. Postoperative changes of partial gastrectomy in the epigastric region. Bowel gas pattern is unremarkable. Patient drank thin barium from a cup. The esophagus is dilated and retains most of the barium. A minimal amount of fluid trickles into the residual stomach. IMPRESSION: Partial gastrectomy with minimal passage of contrast into the residual stomach. Dilated fluid-filled esophagus. Findings are indicative of a high-grade  stricture at the gastroesophageal junction. These results were called by telephone at the time of interpretation on 08/15/2015 at 09:20am to Dr. Stark Klein , who verbally acknowledged these results. Electronically Signed   By: Lorin Picket M.D.   On: 08/15/2015 10:43   Dg Esophagus Dilatation  08/17/2015  CLINICAL DATA:  ESOPHAGEAL DILATATION Fluoroscopy was provided for use by the requesting physician.  No images were obtained for radiographic interpretation.   IMPRESSION:  The patient has been diagnosed with gastric cancer of the proximal stomach (T2N0). The patient has successfully underwent subtotal gastrectomy and is currently using a feeding tube. The patient will complete his chemotherapy on December 15th. The patient would be a good candidate to receive radiation treatment.   PLAN: We discussed the possible side effects and risks of treatment in addition to the possible benefits of treatment. We discussed the protocol for radiation treatment.  All of the patient's questions were answered. The patient does wish to proceed with this treatment. A simulation will be scheduled such that we can proceed with treatment planning.   I anticipate  to begin his radiation treatment the week of December 26th.  December 15th last chemotherapy (4th treatment)   I anticipate 5.5 weeks of treatment.    ________________________________   Jodelle Gross, MD, PhD    **Disclaimer: This note was dictated with voice recognition software. Similar sounding words can inadvertently be transcribed and this note may contain transcription errors which may not have been corrected upon publication of note.**  This document serves as a record of services personally performed by Kyung Rudd, MD. It was created on his behalf by Derek Mound, a trained medical scribe. The creation of this record is based on the scribe's personal observations and the provider's statements to them. This document has been checked and  approved by the attending provider.

## 2015-09-03 NOTE — Progress Notes (Signed)
Please see the Nurse Progress Note in the MD Initial Consult Encounter for this patient. 

## 2015-09-03 NOTE — Telephone Encounter (Signed)
  Follow up Call-  Call back number 08/29/2015 08/22/2015 06/05/2015  Post procedure Call Back phone  # [phone]  Permission to leave phone message Yes Yes Yes     Patient questions:  Do you have a fever, pain , or abdominal swelling? No. Pain Score  0 *  Have you tolerated food without any problems? Yes.    Have you been able to return to your normal activities? Yes.    Do you have any questions about your discharge instructions: Diet   No. Medications  No. Follow up visit  No.  Do you have questions or concerns about your Care? No.  Actions: * If pain score is 4 or above: No action needed, pain <4.

## 2015-09-05 ENCOUNTER — Ambulatory Visit (HOSPITAL_BASED_OUTPATIENT_CLINIC_OR_DEPARTMENT_OTHER): Payer: Medicare Other

## 2015-09-05 ENCOUNTER — Ambulatory Visit (HOSPITAL_BASED_OUTPATIENT_CLINIC_OR_DEPARTMENT_OTHER): Payer: Medicare Other | Admitting: Oncology

## 2015-09-05 ENCOUNTER — Other Ambulatory Visit: Payer: Self-pay | Admitting: Oncology

## 2015-09-05 VITALS — BP 115/73 | HR 68 | Temp 98.8°F | Resp 18 | Ht 67.0 in | Wt 175.7 lb

## 2015-09-05 DIAGNOSIS — E538 Deficiency of other specified B group vitamins: Secondary | ICD-10-CM | POA: Diagnosis not present

## 2015-09-05 DIAGNOSIS — C169 Malignant neoplasm of stomach, unspecified: Secondary | ICD-10-CM

## 2015-09-05 DIAGNOSIS — D649 Anemia, unspecified: Secondary | ICD-10-CM | POA: Diagnosis not present

## 2015-09-05 LAB — CBC WITH DIFFERENTIAL/PLATELET
BASO%: 0.2 % (ref 0.0–2.0)
BASOS ABS: 0 10*3/uL (ref 0.0–0.1)
EOS%: 5.2 % (ref 0.0–7.0)
Eosinophils Absolute: 0.2 10*3/uL (ref 0.0–0.5)
HEMATOCRIT: 37.5 % — AB (ref 38.4–49.9)
HGB: 11.4 g/dL — ABNORMAL LOW (ref 13.0–17.1)
LYMPH#: 1.7 10*3/uL (ref 0.9–3.3)
LYMPH%: 39.9 % (ref 14.0–49.0)
MCH: 24.1 pg — AB (ref 27.2–33.4)
MCHC: 30.4 g/dL — AB (ref 32.0–36.0)
MCV: 79.3 fL (ref 79.3–98.0)
MONO#: 0.4 10*3/uL (ref 0.1–0.9)
MONO%: 10.1 % (ref 0.0–14.0)
NEUT#: 1.9 10*3/uL (ref 1.5–6.5)
NEUT%: 44.6 % (ref 39.0–75.0)
PLATELETS: 137 10*3/uL — AB (ref 140–400)
RBC: 4.73 10*6/uL (ref 4.20–5.82)
RDW: 19.9 % — ABNORMAL HIGH (ref 11.0–14.6)
WBC: 4.2 10*3/uL (ref 4.0–10.3)

## 2015-09-05 MED ORDER — CAPECITABINE 500 MG PO TABS: 800.0000 mg/m2 | ORAL_TABLET | Freq: Two times a day (BID) | ORAL | Status: DC

## 2015-09-05 NOTE — Progress Notes (Signed)
  [facility] OFFICE PROGRESS NOTE   Diagnosis: Gastric cancer  INTERVAL HISTORY:   James [name] returns as scheduled. He completed a first treatment with 5-FU/leucovorin 08/28/2015. He tolerated chemotherapy well. No mouth sores, nausea, or diarrhea. He continues nighttime tube feedings. He is tolerating oral feeding as well. He saw Dr. Lisbeth Renshaw yesterday and will be scheduled for adjuvant radiation.   Objective:  Vital signs in last 24 hours:  Blood pressure 115/73, pulse 68, temperature 98.8 F (37.1 C), temperature source Oral, resp. rate 18, height 5\' 7"  (1.702 m), weight 175 lb 11.2 oz (79.697 kg), SpO2 100 %.    HEENT: No thrush or ulcers Resp: Lungs clear bilaterally Cardio: Regular rate and rhythm GI: No hepatomegaly, healed midline incision, left upper quadrant feeding tube site without evidence of infection Vascular: No leg edema   Lab Results:  Lab Results  Component Value Date   WBC 4.2 09/05/2015   HGB 11.4* 09/05/2015   HCT 37.5* 09/05/2015   MCV 79.3 09/05/2015   PLT 137* 09/05/2015   NEUTROABS 1.9 09/05/2015     Medications: I have reviewed the patient's current medications.  Assessment/Plan: 1. Gastric cancer-adenocarcinoma of the gastric fundus/cardia  Staging CT scans 06/07/2015 with no evidence of distant metastatic disease, tiny nonspecific pulmonary nodules and liver lesions-likely benign  Subtotal gastrectomy and placement of a jejunostomy feeding tube 07/18/2015 for a T2,N0 tumor with negative surgical margins  Initiation of adjuvant 5-FU/leucovorin 08/28/2015 2. Microcytic anemia-likely iron deficiency anemia secondary to #1  3. Vitamin B-12 deficiency -now on vitamin B-12 replacement  4. 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 Microcytic  anemia-likely iron deficiency anemia secondary to bleeding from the gastric tumor     Disposition:  He appears well. The plan is to continue weekly 5-FU/leucovorin for 4 total treatments. He will then begin concurrent radiation and capecitabine. I reviewed the potential toxicities associated with capecitabine including the chance for mucositis, diarrhea, and hematologic toxicity. We discussed the hyperpigmentation, sun sensitivity, and hand/foot syndrome associated with capecitabine. He agrees to proceed.  James [name] will be seen for an office visit concurrent with the final planned week of 5-FU/leucovorin on 09/20/2015.  Betsy Coder, MD  09/05/2015  8:42 AM

## 2015-09-06 ENCOUNTER — Other Ambulatory Visit: Payer: Self-pay | Admitting: *Deleted

## 2015-09-06 ENCOUNTER — Ambulatory Visit (HOSPITAL_BASED_OUTPATIENT_CLINIC_OR_DEPARTMENT_OTHER): Payer: Medicare Other

## 2015-09-06 ENCOUNTER — Ambulatory Visit: Payer: Medicare Other | Admitting: Nutrition

## 2015-09-06 VITALS — BP 137/83 | HR 76 | Temp 98.3°F | Resp 18

## 2015-09-06 DIAGNOSIS — C169 Malignant neoplasm of stomach, unspecified: Secondary | ICD-10-CM | POA: Diagnosis not present

## 2015-09-06 DIAGNOSIS — C165 Malignant neoplasm of lesser curvature of stomach, unspecified: Secondary | ICD-10-CM

## 2015-09-06 DIAGNOSIS — Z5111 Encounter for antineoplastic chemotherapy: Secondary | ICD-10-CM | POA: Diagnosis not present

## 2015-09-06 MED ORDER — PROCHLORPERAZINE MALEATE 10 MG PO TABS
ORAL_TABLET | ORAL | Status: AC
  Filled 2015-09-06: qty 1

## 2015-09-06 MED ORDER — FERROUS SULFATE 325 (65 FE) MG PO TBEC: 325.0000 mg | DELAYED_RELEASE_TABLET | Freq: Every day | ORAL | Status: DC

## 2015-09-06 MED ORDER — LEUCOVORIN CALCIUM INJECTION 350 MG
500.0000 mg/m2 | Freq: Once | INTRAVENOUS | Status: AC
  Administered 2015-09-06: 970 mg via INTRAVENOUS
  Filled 2015-09-06: qty 48.5

## 2015-09-06 MED ORDER — FLUOROURACIL CHEMO INJECTION 2.5 GM/50ML
600.0000 mg/m2 | Freq: Once | INTRAVENOUS | Status: AC
  Administered 2015-09-06: 1150 mg via INTRAVENOUS
  Filled 2015-09-06: qty 23

## 2015-09-06 MED ORDER — PROCHLORPERAZINE MALEATE 10 MG PO TABS
10.0000 mg | ORAL_TABLET | Freq: Once | ORAL | Status: AC
  Administered 2015-09-06: 10 mg via ORAL

## 2015-09-06 MED ORDER — SODIUM CHLORIDE 0.9 % IV SOLN
Freq: Once | INTRAVENOUS | Status: AC
  Administered 2015-09-06: 13:00:00 via INTRAVENOUS

## 2015-09-06 NOTE — Patient Instructions (Signed)
[facility] Discharge Instructions for Patients Receiving Chemotherapy  Today you received the following chemotherapy agents;  Leucovorin and Fluorouracil.   To help prevent nausea and vomiting after your treatment, we encourage you to take your nausea medication as directed.    If you develop nausea and vomiting that is not controlled by your nausea medication, call the clinic.   BELOW ARE SYMPTOMS THAT SHOULD BE REPORTED IMMEDIATELY:  *FEVER GREATER THAN 100.5 F  *CHILLS WITH OR WITHOUT FEVER  NAUSEA AND VOMITING THAT IS NOT CONTROLLED WITH YOUR NAUSEA MEDICATION  *UNUSUAL SHORTNESS OF BREATH  *UNUSUAL BRUISING OR BLEEDING  TENDERNESS IN MOUTH AND THROAT WITH OR WITHOUT PRESENCE OF ULCERS  *URINARY PROBLEMS  *BOWEL PROBLEMS  UNUSUAL RASH Items with * indicate a potential emergency and should be followed up as soon as possible.  Feel free to call the clinic you have any questions or concerns. The clinic phone number is [phone].  Please show the CHEMO ALERT CARD at check-in to the Emergency Department and triage nurse.   

## 2015-09-06 NOTE — Progress Notes (Signed)
Okay to treat today with no CMet per Dr. Benay Spice.

## 2015-09-06 NOTE — Progress Notes (Signed)
Nutrition follow-up completed with patient and wife, during chemotherapy for gastric mass. Patient is status post subtotal gastrectomy with jejunostomy feeding tube placement. Patient is now tolerating Jevity 1.5 at 60 mL an hour for 8 hours overnight. Patient denies difficulty with tube feedings at this time. Swallowing difficulties continue.  Patient is followed by GI physician has been dilating his esophagus. Weight decreased slightly and documented as 175.7 pounds November 30, down from 176.4 pounds November 17. Jevity 1.5, 2 cans daily provides 710 cal, 30.2 g protein, 360 mL free water.  Nutrition diagnosis: Food and nutrition related knowledge deficit improved.  Intervention:  Patient educated to continue soft moist foods as tolerated for easier swallowing and promoting weight gain and healing. Continue nocturnal feedings utilizing Jevity 1.5 at 60 mL an hour for 8 hours. Educated patient tube feedings could be increased if oral intake decreases and weight loss occurs. Teach back method was used.  Monitoring, evaluation, goals:  Patient will work to increase calories and protein by mouth and tolerate jejunostomy tube feedings to minimize weight loss.  Next visit: Thursday, December 15, during infusion.  **Disclaimer: This note was dictated with voice recognition software. Similar sounding words can inadvertently be transcribed and this note may contain transcription errors which may not have been corrected upon publication of note.**

## 2015-09-07 ENCOUNTER — Other Ambulatory Visit: Payer: Self-pay

## 2015-09-07 ENCOUNTER — Ambulatory Visit (HOSPITAL_COMMUNITY)
Admission: RE | Admit: 2015-09-07 | Discharge: 2015-09-07 | Disposition: A | Discharge status: Home / Self Care | Payer: Medicare Other | Source: Ambulatory Visit | Attending: Internal Medicine | Admitting: Internal Medicine

## 2015-09-07 ENCOUNTER — Other Ambulatory Visit (INDEPENDENT_AMBULATORY_CARE_PROVIDER_SITE_OTHER): Payer: Medicare Other

## 2015-09-07 ENCOUNTER — Telehealth: Payer: Self-pay | Admitting: Internal Medicine

## 2015-09-07 ENCOUNTER — Encounter (HOSPITAL_COMMUNITY): Payer: Self-pay

## 2015-09-07 DIAGNOSIS — R1310 Dysphagia, unspecified: Secondary | ICD-10-CM

## 2015-09-07 DIAGNOSIS — R911 Solitary pulmonary nodule: Secondary | ICD-10-CM | POA: Diagnosis not present

## 2015-09-07 DIAGNOSIS — C162 Malignant neoplasm of body of stomach: Secondary | ICD-10-CM

## 2015-09-07 DIAGNOSIS — R1319 Other dysphagia: Secondary | ICD-10-CM

## 2015-09-07 DIAGNOSIS — N62 Hypertrophy of breast: Secondary | ICD-10-CM | POA: Insufficient documentation

## 2015-09-07 DIAGNOSIS — Z903 Acquired absence of stomach [part of]: Secondary | ICD-10-CM | POA: Diagnosis not present

## 2015-09-07 DIAGNOSIS — I2510 Atherosclerotic heart disease of native coronary artery without angina pectoris: Secondary | ICD-10-CM | POA: Insufficient documentation

## 2015-09-07 DIAGNOSIS — K5790 Diverticulosis of intestine, part unspecified, without perforation or abscess without bleeding: Secondary | ICD-10-CM | POA: Diagnosis not present

## 2015-09-07 DIAGNOSIS — C169 Malignant neoplasm of stomach, unspecified: Secondary | ICD-10-CM | POA: Diagnosis not present

## 2015-09-07 LAB — BASIC METABOLIC PANEL
BUN: 11 mg/dL (ref 6–23)
CHLORIDE: 103 meq/L (ref 96–112)
CO2: 29 meq/L (ref 19–32)
Calcium: 9.3 mg/dL (ref 8.4–10.5)
Creatinine, Ser: 0.7 mg/dL (ref 0.40–1.50)
GFR: 118.24 mL/min (ref >60.00)
GLUCOSE: 100 mg/dL — AB (ref 70–99)
POTASSIUM: 4 meq/L (ref 3.5–5.1)
SODIUM: 140 meq/L (ref 135–145)

## 2015-09-07 MED ORDER — IOHEXOL 300 MG/ML  SOLN
100.0000 mL | Freq: Once | INTRAMUSCULAR | Status: AC | PRN
  Administered 2015-09-07: 100 mL via INTRAVENOUS

## 2015-09-07 NOTE — Telephone Encounter (Signed)
He needs to be scoped today or tomorrow I suspect  If he has his J tube he could go back to using that and maintaining and we could defer until next week  Otherwise best I can do is have him go to ED if he is that bad  I am ccing Dr. Barry Dienes also though she may not see it immediately

## 2015-09-07 NOTE — Telephone Encounter (Signed)
He does have the J-tube.  He can use.  He is advised to avoid solid food, liquids only.  He would like to avoid the ER and have procedure next week.  Please advise on possible plan for next week.

## 2015-09-07 NOTE — Telephone Encounter (Signed)
Let him know I have communicated with Dr. 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

## 2015-09-07 NOTE — Telephone Encounter (Signed)
Patient reports since the 28th he has had dysphagia. Symptoms are new since speaking to Dr. Carlean Purl on 09/03/15.   "I go to sit down and eat and I can't get it down.  I go throw it up then I can sometimes eat again".  He reports that this morning he has not been able to swallow I am only throwing up". He does not feel like he has a food impaction, everything he ate did "come back up with throwing up".  Dr. Carlean Purl please advise

## 2015-09-07 NOTE — Progress Notes (Signed)
Quick Note:  Nothing bad here...will f/u with him re: next steps ______

## 2015-09-07 NOTE — Telephone Encounter (Signed)
Patient is scheduled for CT chest and abdomen this afternoon. He will come pick up the contrast now to begin drinking at 3:30 for 4:30 appt.  He will also come for labs this afternoon. He is scheduled for EGD 09/12/15 12:30 at [facility]

## 2015-09-10 NOTE — Progress Notes (Signed)
Quick Note:  Spoke to him Repeat dili in 2d - he is tolerating liquids  I am referring him to Dr. Gayleen Orem - I spoke to him today - and explained to patient - Dr. Lysle Rubens is able to incise post-op strictures for better results - we are asking for procedure direct not consult   Need to fax info to F: [phone]  Please let him know its about patient we spoke about Send surgery op note, CT reports, My last note Med onc and Rad onc notes  Thanks ______

## 2015-09-12 ENCOUNTER — Ambulatory Visit (HOSPITAL_COMMUNITY): Payer: Medicare Other | Admitting: Registered Nurse

## 2015-09-12 ENCOUNTER — Ambulatory Visit: Payer: Medicare Other

## 2015-09-12 ENCOUNTER — Ambulatory Visit (HOSPITAL_COMMUNITY)
Admission: RE | Admit: 2015-09-12 | Discharge: 2015-09-12 | Disposition: A | Discharge status: Home / Self Care | Payer: Medicare Other | Source: Ambulatory Visit | Attending: Internal Medicine | Admitting: Internal Medicine

## 2015-09-12 ENCOUNTER — Telehealth: Payer: Self-pay

## 2015-09-12 ENCOUNTER — Encounter (HOSPITAL_COMMUNITY)
Admission: RE | Disposition: A | Discharge status: Home / Self Care | Payer: Self-pay | Source: Ambulatory Visit | Attending: Internal Medicine

## 2015-09-12 ENCOUNTER — Other Ambulatory Visit: Payer: Self-pay

## 2015-09-12 ENCOUNTER — Encounter (HOSPITAL_COMMUNITY): Payer: Self-pay

## 2015-09-12 DIAGNOSIS — Z8546 Personal history of malignant neoplasm of prostate: Secondary | ICD-10-CM | POA: Insufficient documentation

## 2015-09-12 DIAGNOSIS — Z87891 Personal history of nicotine dependence: Secondary | ICD-10-CM | POA: Insufficient documentation

## 2015-09-12 DIAGNOSIS — K3189 Other diseases of stomach and duodenum: Secondary | ICD-10-CM | POA: Insufficient documentation

## 2015-09-12 DIAGNOSIS — Z85028 Personal history of other malignant neoplasm of stomach: Secondary | ICD-10-CM | POA: Insufficient documentation

## 2015-09-12 DIAGNOSIS — E785 Hyperlipidemia, unspecified: Secondary | ICD-10-CM | POA: Insufficient documentation

## 2015-09-12 DIAGNOSIS — K929 Disease of digestive system, unspecified: Secondary | ICD-10-CM

## 2015-09-12 DIAGNOSIS — Z923 Personal history of irradiation: Secondary | ICD-10-CM | POA: Diagnosis not present

## 2015-09-12 DIAGNOSIS — Z85828 Personal history of other malignant neoplasm of skin: Secondary | ICD-10-CM | POA: Insufficient documentation

## 2015-09-12 DIAGNOSIS — D649 Anemia, unspecified: Secondary | ICD-10-CM | POA: Insufficient documentation

## 2015-09-12 DIAGNOSIS — Z79899 Other long term (current) drug therapy: Secondary | ICD-10-CM | POA: Diagnosis not present

## 2015-09-12 DIAGNOSIS — K222 Esophageal obstruction: Secondary | ICD-10-CM | POA: Insufficient documentation

## 2015-09-12 DIAGNOSIS — Z931 Gastrostomy status: Secondary | ICD-10-CM | POA: Diagnosis not present

## 2015-09-12 DIAGNOSIS — Z903 Acquired absence of stomach [part of]: Secondary | ICD-10-CM | POA: Insufficient documentation

## 2015-09-12 DIAGNOSIS — K219 Gastro-esophageal reflux disease without esophagitis: Secondary | ICD-10-CM | POA: Insufficient documentation

## 2015-09-12 DIAGNOSIS — J45909 Unspecified asthma, uncomplicated: Secondary | ICD-10-CM | POA: Diagnosis not present

## 2015-09-12 HISTORY — PX: BALLOON DILATION: SHX5330

## 2015-09-12 HISTORY — PX: ESOPHAGOGASTRODUODENOSCOPY (EGD) WITH PROPOFOL: SHX5813

## 2015-09-12 SURGERY — ESOPHAGOGASTRODUODENOSCOPY (EGD) WITH PROPOFOL
Anesthesia: Monitor Anesthesia Care

## 2015-09-12 MED ORDER — PROPOFOL 10 MG/ML IV BOLUS
INTRAVENOUS | Status: AC
  Filled 2015-09-12: qty 20

## 2015-09-12 MED ORDER — PROPOFOL 10 MG/ML IV BOLUS
INTRAVENOUS | Status: AC
  Filled 2015-09-12: qty 40

## 2015-09-12 MED ORDER — LACTATED RINGERS IV SOLN
INTRAVENOUS | Status: DC
  Administered 2015-09-12: 1000 mL via INTRAVENOUS

## 2015-09-12 MED ORDER — PROPOFOL 10 MG/ML IV BOLUS
INTRAVENOUS | Status: DC | PRN
  Administered 2015-09-12 (×5): 50 mg via INTRAVENOUS
  Administered 2015-09-12: 100 mg via INTRAVENOUS

## 2015-09-12 MED ORDER — LIDOCAINE HCL (CARDIAC) 20 MG/ML IV SOLN
INTRAVENOUS | Status: AC
  Filled 2015-09-12: qty 5

## 2015-09-12 MED ORDER — LIDOCAINE HCL (CARDIAC) 20 MG/ML IV SOLN
INTRAVENOUS | Status: DC | PRN
  Administered 2015-09-12 (×2): 50 mg via INTRAVENOUS

## 2015-09-12 SURGICAL SUPPLY — 14 items

## 2015-09-12 NOTE — Anesthesia Postprocedure Evaluation (Signed)
Anesthesia Post Note  Patient: James [name]  Procedure(s) Performed: Procedure(s) (LRB): ESOPHAGOGASTRODUODENOSCOPY (EGD) WITH PROPOFOL (N/A) BALLOON DILATION (N/A)  Patient location during evaluation: PACU Anesthesia Type: MAC Level of consciousness: awake and alert Pain management: pain level controlled Vital Signs Assessment: post-procedure vital signs reviewed and stable Respiratory status: spontaneous breathing, nonlabored ventilation, respiratory function stable and patient connected to nasal cannula oxygen Cardiovascular status: stable and blood pressure returned to baseline Anesthetic complications: no    Last Vitals:  Filed Vitals:   09/12/15 1330 09/12/15 1340  BP: 123/68   Pulse: 58 52  Temp:    Resp: 14 17    Last Pain: There were no vitals filed for this visit.               [name]

## 2015-09-12 NOTE — Telephone Encounter (Signed)
-----   Message from Gatha Mayer, MD sent at 09/12/2015  1:48 PM EST ----- Regarding: 12/16 dilation Spoke to Sharee Pimple - let's put him in for 0800 12/16 MAC EGD/balloon dilation

## 2015-09-12 NOTE — Telephone Encounter (Signed)
Patient is scheduled for [facility] for 09/21/15 8:00.  Left message for patient to call back

## 2015-09-12 NOTE — Discharge Instructions (Signed)
° °  I dilated to 18 mm today. Let's plan to repeat it next Friday 12/16 - details to follow.  Gatha Mayer, MD, FACG   YOU HAD AN ENDOSCOPIC PROCEDURE TODAY: Refer to the procedure report and other information in the discharge instructions given to you for any specific questions about what was found during the examination. If this information does not answer your questions, please call Dr. Celesta Aver office at [phone] to clarify.   YOU SHOULD EXPECT: Some feelings of bloating in the abdomen. Passage of more gas than usual. Walking can help get rid of the air that was put into your GI tract during the procedure and reduce the bloating. If you had a lower endoscopy (such as a colonoscopy or flexible sigmoidoscopy) you may notice spotting of blood in your stool or on the toilet paper. Some abdominal soreness may be present for a day or two, also.  DIET:  Clear liquids until 230 PM then very soft foods and then try the dysphagia 3 diet again tomorrow.   ACTIVITY: Your care partner should take you home directly after the procedure. You should plan to take it easy, moving slowly for the rest of the day. You can resume normal activity the day after the procedure however YOU SHOULD NOT DRIVE, use power tools, machinery or perform tasks that involve climbing or major physical exertion for 24 hours (because of the sedation medicines used during the test).   SYMPTOMS TO REPORT IMMEDIATELY: A gastroenterologist can be reached at any hour. Please call [phone]  for any of the following symptoms:   Following upper endoscopy (EGD, EUS, ERCP, esophageal dilation) Vomiting of blood or coffee ground material  New, significant abdominal pain  New, significant chest pain or pain under the shoulder blades  Painful or persistently difficult swallowing  New shortness of breath  Black, tarry-looking or red, bloody stools  FOLLOW UP:  If any biopsies were taken you will be contacted by phone or by  letter within the next 1-3 weeks. Call [phone]  if you have not heard about the biopsies in 3 weeks.  Please also call with any specific questions about appointments or follow up tests.

## 2015-09-12 NOTE — H&P (Signed)
[facility] History and Physical   Primary Care Physician:  [name], MD   Reason for Procedure:   Dilate stricture Plan:    Dilate anastomotic stricture by EGd The risks and benefits as well as alternatives of endoscopic procedure(s) have been discussed and reviewed. All questions answered. The patient agrees to proceed.  HPI: James [name] is a 69 y.o. male with recurrent post-op esophagogastric anastomotic stricture - he has recurrent dysphagia and is for dilation today.   Past Medical History  Diagnosis Date  . Abnormal EKG     NS ST-T EKG Changes, (-) Nuclear Stress Test 03/2006  . Hyperlipemia   . Duodenal ulcer 1972    transfusion required, 1 unit packed cells  . Hx of basal cell carcinoma   . S/P radiation therapy 11/29/2014 through 01/23/2015     Prostate 7800 cGy in 40 sessions, seminal vesicles 5600 cGy in 40 sessions   . B12 deficiency 06/05/2015    B12 Low NL at 271 methtylmalonic acid high  . Transfusion history     '72 -s/p surgery for duodenal ulcer  . Anemia     iron deficency  . Prostate cancer ([place]) 09/06/14    last radiation 4'16   . Skin cancer, basal cell   . Cardiac murmur     nothing to be concerned with   . Asthma   . Esophageal reflux     no currlently  . 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 Allergy     Past Surgical History  Procedure Laterality Date  . Trigger finger release Left 1998  . Rotator cuff repair Right     R shoulder, [place]  . Knee arthroscopy Right     Right Knee, [facility]  . Colonoscopy with polypectomy  02/2012    Dr Sharlett Iles  . Prostate biopsy  09/06/14  . Cataract extraction, bilateral Bilateral   . Vasectomy    . Eus N/A 06/14/2015    Procedure: ESOPHAGEAL ENDOSCOPIC ULTRASOUND (EUS) RADIAL;  Surgeon: Milus Banister, MD;  Location: [facility];  Service: Endoscopy;  Laterality: N/A;  . Laparoscopy N/A  07/18/2015    Procedure: LAPAROSCOPY DIAGNOSTIC;  Surgeon: Stark Klein, MD;  Location: [facility];  Service: General;  Laterality: N/A;  . Laparoscopic gastrectomy N/A 07/18/2015    Procedure: SUB TOTAL GASTRECTOMY;  Surgeon: Stark Klein, MD;  Location: [facility];  Service: General;  Laterality: N/A;  . Laparoscopic gastrostomy N/A 07/18/2015    Procedure: FEEDING TUBE PLACEMENT;  Surgeon: Stark Klein, MD;  Location: [facility];  Service: General;  Laterality: N/A;  . Esophagogastroduodenoscopy (egd) with propofol N/A 08/17/2015    Procedure: ESOPHAGOGASTRODUODENOSCOPY (EGD) WITH PROPOFOL;  Surgeon: Gatha Mayer, MD;  Location: [facility];  Service: Endoscopy;  Laterality: N/A;    Prior to Admission medications   Medication Sig Start Date End Date Taking? Authorizing Provider  ferrous sulfate 325 (65 FE) MG EC tablet Take 1 tablet (325 mg total) by mouth daily. 09/06/15  Yes Ladell Pier, MD  Nutritional Supplements (FEEDING SUPPLEMENT, JEVITY 1.5 CAL,) LIQD Place 1,000 mLs into feeding tube continuous. Put into J tube via pump at 40cc/h Patient taking differently: Place 1,000 mLs into feeding tube continuous. Put into J tube via pump at 65cc/h- 12 hours a day 07/24/15  Yes Arta Bruce Kinsinger, MD  omeprazole (PRILOSEC) 40 MG capsule Take 1 capsule (40 mg total) by mouth 2 (two) times daily before a meal. 30 MINS BEFORE  BREAKFAST/SUPPER 08/17/15  Yes Gatha Mayer, MD  pravastatin (PRAVACHOL) 40 MG tablet Take 1 tablet (40 mg total) by mouth daily. HOLD UNTIL SWALLOWING BETTER 08/17/15  Yes Gatha Mayer, MD  Water For Irrigation, Sterile (FREE WATER) SOLN Place 240 mLs into feeding tube 4 (four) times daily. Patient taking differently: Place 240 mLs into feeding tube 2 (two) times daily.  07/24/15  Yes Arta Bruce Kinsinger, MD  capecitabine (XELODA) 500 MG tablet Take 3 tablets (1,500 mg total) by mouth 2 (two) times daily after a meal. Take on days of Radiation Only 09/24/15   Ladell Pier,  MD    Current Facility-Administered Medications  Medication Dose Route Frequency Provider Last Rate Last Dose  . lactated ringers infusion   Intravenous Continuous Gatha Mayer, MD 50 mL/hr at 09/12/15 1158 1,000 mL at 09/12/15 1158    Allergies as of 09/07/2015 - Review Complete 09/07/2015  Allergen Reaction Noted  . Codeine      Family History  Problem Relation Age of Onset  . Diabetes Father   . Heart attack Father 66  . Liver disease Father     Related to Alcohol Use   . Alcohol abuse Mother   . Cirrhosis Mother     died in her 56s  . Multiple myeloma Brother   . Colon cancer Neg Hx   . Stomach cancer Neg Hx   . Stroke Neg Hx   . Diabetes Sister     Social History   Social History  . Marital Status: Married    Spouse Name: N/A  . Number of Children: 2  . Years of Education: N/A   Occupational History  . retired    Social History Main Topics  . Smoking status: Former Smoker -- 0.50 packs/day for 5 years    Types: Cigarettes    Quit date: 10/07/1967  . Smokeless tobacco: Never Used     Comment: smoked [place] , up to < 1 ppd  . Alcohol Use: 0.0 oz/week     Comment:  < 3 / week- none in   month  . Drug Use: No  . Sexual Activity: Not on file   Other Topics Concern  . Not on file   Social History Narrative   Married and retired - 1 son and 1 daughter    Lives at [place] and active golfer, tennis player   05/30/2015       Review of Systems: Some anxiety All other review of systems negative except as mentioned in the HPI.  Physical Exam: Vital signs in last 24 hours: Temp:  [98 F (36.7 C)] 98 F (36.7 C) (12/07 1153) Pulse Rate:  [63] 63 (12/07 1152) Resp:  [20] 20 (12/07 1152) BP: (131)/(77) 131/77 mmHg (12/07 1152) SpO2:  [100 %] 100 % (12/07 1152)   General:   Alert,  Well-developed, well-nourished, pleasant and cooperative in NAD Lungs:  Clear throughout to auscultation.   Heart:  Regular rate and rhythm; no murmurs, clicks, rubs,   or gallops. Abdomen:  Soft, nontender and nondistended. Normal bowel sounds.   Neuro/Psych:  Alert and cooperative. Normal mood and affect. A and O x 3   @Carl  Simonne Maffucci, MD, [facility] [phone] (pager) 09/12/2015 12:47 PM@

## 2015-09-12 NOTE — Transfer of Care (Signed)
Immediate Anesthesia Transfer of Care Note  Patient: James [name]  Procedure(s) Performed: Procedure(s): ESOPHAGOGASTRODUODENOSCOPY (EGD) WITH PROPOFOL (N/A) BALLOON DILATION (N/A)  Patient Location: PACU  Anesthesia Type:MAC  Level of Consciousness: alert , oriented and sedated  Airway & Oxygen Therapy: Patient Spontanous Breathing and Patient connected to nasal cannula oxygen  Post-op Assessment: Report given to RN and Post -op Vital signs reviewed and stable  Post vital signs: Reviewed and stable  Last Vitals:  Filed Vitals:   09/12/15 1152 09/12/15 1153  BP: 131/77   Pulse: 63   Temp:  36.7 C  Resp: 20     Complications: No apparent anesthesia complications

## 2015-09-12 NOTE — Anesthesia Preprocedure Evaluation (Signed)
Anesthesia Evaluation  Patient identified by MRN, date of birth, ID band Patient awake    Reviewed: Allergy & Precautions, H&P , NPO status , Patient's Chart, lab work & pertinent test results  History of Anesthesia Complications Negative for: history of anesthetic complications  Airway Mallampati: II  TM Distance: >3 FB Neck ROM: full    Dental no notable dental hx. (+) Dental Advisory Given, Caps,    Pulmonary asthma , former smoker,    Pulmonary exam normal breath sounds clear to auscultation       Cardiovascular Exercise Tolerance: Good (-) hypertension(-) angina(-) Past MI negative cardio ROS Normal cardiovascular exam+ Valvular Problems/Murmurs  Rhythm:regular Rate:Normal     Neuro/Psych Depression negative neurological ROS  negative psych ROS   GI/Hepatic Neg liver ROS, PUD, GERD  Medicated and Controlled,Stomach cancer    Endo/Other  negative endocrine ROS  Renal/GU negative Renal ROS  negative genitourinary   Musculoskeletal negative musculoskeletal ROS (+)   Abdominal   Peds  Hematology  (+) Blood dyscrasia, anemia ,   Anesthesia Other Findings   Reproductive/Obstetrics                             Anesthesia Physical  Anesthesia Plan  ASA: III  Anesthesia Plan: MAC   Post-op Pain Management:    Induction: Intravenous  Airway Management Planned: Nasal Cannula  Additional Equipment:   Intra-op Plan:   Post-operative Plan:   Informed Consent: I have reviewed the patients History and Physical, chart, labs and discussed the procedure including the risks, benefits and alternatives for the proposed anesthesia with the patient or authorized representative who has indicated his/her understanding and acceptance.   Dental advisory given  Plan Discussed with: CRNA  Anesthesia Plan Comments: (Esophageal dilation)        Anesthesia Quick Evaluation

## 2015-09-12 NOTE — Op Note (Signed)
[facility] [place], [address]   ENDOSCOPY PROCEDURE REPORT  PATIENT: James [name], James [name]  MR#: [id number] BIRTHDATE: 12-13-1945 , 40  yrs. old GENDER: male ENDOSCOPIST: Gatha Mayer, MD, [facility] PROCEDURE DATE:  09/12/2015 PROCEDURE:  Esophagoscopy  + balloon dilation < 30 mm ASA CLASS:     Class III INDICATIONS:  therapeutic procedure.  dilate strictuure MEDICATIONS: Monitored anesthesia care and Per Anesthesia TOPICAL ANESTHETIC: none  DESCRIPTION OF PROCEDURE: After the risks benefits and alternatives of the procedure were thoroughly explained, informed consent was obtained.  The [place] [id number] endoscope was introduced through the mouth and advanced to the second portion of the duodenum , Without limitations.  The instrument was slowly withdrawn as the mucosa was fully examined.    Tight esophago-gastric anastomotic stricture seen again 38 cm from incisosrs - dilated w/ balloons 12, 13.5, 15, 16.5 and 18 mm at least 60 sec each.  scope then passed and good result.  Limited gastric remnant views ok.  Retroflexed views revealed no abnormalities.     The scope was then withdrawn from the patient and the procedure completed.  COMPLICATIONS: There were no immediate complications.  ENDOSCOPIC IMPRESSION: Tight esophago-gastric anastomotic stricture seen again 38 cm from incisosrs - dilated w/ balloons 12, 13.5, 15, 16.5 and 18 mm at least 60 sec each.  scope then passed and good result.  Limited gastric remnant views ok   Last 2 dilations were to 15.28mm, this is dilation # 4 since 11/11, also had 11/16 and 11/23.  RECOMMENDATIONS: Clear liquids unti 230, then soft foods.  Try Dysphagia 3 tomorrow. Anticipate repeat dilation 12/16 0800 [facility] w/ MAC.  Sharee Pimple aware but needs to be ordered/scheduled by my office. Hold off on XRT until we know this will stay open. referral to Dr. Lysle Rubens at [facility] started as concerned may need Tx other than balloon  dilation for long-lasting patency.   eSigned:  Gatha Mayer, MD, [facility] 09/12/2015 1:37 PM    CC: Drs. Cheral Marker and Kyung Rudd, The Patient, Dr. Gayleen Orem

## 2015-09-13 ENCOUNTER — Encounter: Payer: Self-pay | Admitting: Pharmacist

## 2015-09-13 ENCOUNTER — Ambulatory Visit (HOSPITAL_BASED_OUTPATIENT_CLINIC_OR_DEPARTMENT_OTHER): Payer: Medicare Other

## 2015-09-13 ENCOUNTER — Encounter: Payer: Self-pay | Admitting: *Deleted

## 2015-09-13 ENCOUNTER — Encounter (HOSPITAL_COMMUNITY): Payer: Self-pay | Admitting: Internal Medicine

## 2015-09-13 ENCOUNTER — Other Ambulatory Visit (HOSPITAL_BASED_OUTPATIENT_CLINIC_OR_DEPARTMENT_OTHER): Payer: Medicare Other

## 2015-09-13 VITALS — BP 130/80 | HR 65 | Temp 97.3°F | Resp 20

## 2015-09-13 DIAGNOSIS — Z5111 Encounter for antineoplastic chemotherapy: Secondary | ICD-10-CM | POA: Diagnosis not present

## 2015-09-13 DIAGNOSIS — C165 Malignant neoplasm of lesser curvature of stomach, unspecified: Secondary | ICD-10-CM

## 2015-09-13 DIAGNOSIS — C169 Malignant neoplasm of stomach, unspecified: Secondary | ICD-10-CM

## 2015-09-13 LAB — CBC WITH DIFFERENTIAL/PLATELET
BASO%: 0.3 % (ref 0.0–2.0)
BASOS ABS: 0 10*3/uL (ref 0.0–0.1)
EOS%: 5.3 % (ref 0.0–7.0)
Eosinophils Absolute: 0.3 10*3/uL (ref 0.0–0.5)
HEMATOCRIT: 37.6 % — AB (ref 38.4–49.9)
HEMOGLOBIN: 11.8 g/dL — AB (ref 13.0–17.1)
LYMPH#: 1.6 10*3/uL (ref 0.9–3.3)
LYMPH%: 33.4 % (ref 14.0–49.0)
MCH: 24.7 pg — AB (ref 27.2–33.4)
MCHC: 31.4 g/dL — ABNORMAL LOW (ref 32.0–36.0)
MCV: 78.7 fL — ABNORMAL LOW (ref 79.3–98.0)
MONO#: 0.4 10*3/uL (ref 0.1–0.9)
MONO%: 8.5 % (ref 0.0–14.0)
NEUT%: 52.5 % (ref 39.0–75.0)
NEUTROS ABS: 2.5 10*3/uL (ref 1.5–6.5)
Platelets: 121 10*3/uL — ABNORMAL LOW (ref 140–400)
RBC: 4.78 10*6/uL (ref 4.20–5.82)
RDW: 22.6 % — AB (ref 11.0–14.6)
WBC: 4.8 10*3/uL (ref 4.0–10.3)

## 2015-09-13 MED ORDER — FLUOROURACIL CHEMO INJECTION 2.5 GM/50ML
600.0000 mg/m2 | Freq: Once | INTRAVENOUS | Status: AC
  Administered 2015-09-13: 1150 mg via INTRAVENOUS
  Filled 2015-09-13: qty 23

## 2015-09-13 MED ORDER — LEUCOVORIN CALCIUM INJECTION 350 MG
500.0000 mg/m2 | Freq: Once | INTRAVENOUS | Status: AC
  Administered 2015-09-13: 970 mg via INTRAVENOUS
  Filled 2015-09-13: qty 48.5

## 2015-09-13 MED ORDER — PROCHLORPERAZINE MALEATE 10 MG PO TABS
10.0000 mg | ORAL_TABLET | Freq: Once | ORAL | Status: AC
  Administered 2015-09-13: 10 mg via ORAL

## 2015-09-13 MED ORDER — SODIUM CHLORIDE 0.9 % IV SOLN
Freq: Once | INTRAVENOUS | Status: AC
  Administered 2015-09-13: 10:00:00 via INTRAVENOUS

## 2015-09-13 MED ORDER — PROCHLORPERAZINE MALEATE 10 MG PO TABS
ORAL_TABLET | ORAL | Status: AC
  Filled 2015-09-13: qty 1

## 2015-09-13 NOTE — Progress Notes (Signed)
Oncology Nurse Navigator Documentation  Oncology Nurse Navigator Flowsheets 09/13/2015  Referral date to RadOnc/MedOnc -  Navigator Encounter Type Treatment  Patient Visit Type Medonc  Treatment Phase Treatment #3/4-5FU/Leucovorin  Barriers/Navigation Needs No barriers at this time  Education -  Interventions None required  Coordination of Care -  Education Method -  Support Groups/Services -  Time Spent with Patient 10  Last dilation of esophagus was 09/12/15 per Dr. Carlean Purl. Reports he is getting referred to a GI specialist at [facility] and he was told he should not have radiation at this time. Expects to be seen there within the week. Reports he is eating soft foods OK and liquids easily. Spirits are good-says "I have a wonderful wife".

## 2015-09-13 NOTE — Anesthesia Preprocedure Evaluation (Addendum)
Anesthesia Evaluation  Patient identified by MRN, date of birth, ID band Patient awake    Reviewed: Allergy & Precautions, H&P , NPO status , Patient's Chart, lab work & pertinent test results  History of Anesthesia Complications Negative for: history of anesthetic complications  Airway Mallampati: II  TM Distance: >3 FB Neck ROM: full    Dental no notable dental hx. (+) Dental Advisory Given, Caps,    Pulmonary asthma , former smoker,    Pulmonary exam normal breath sounds clear to auscultation       Cardiovascular Exercise Tolerance: Good (-) hypertension(-) angina(-) Past MI negative cardio ROS Normal cardiovascular exam+ Valvular Problems/Murmurs  Rhythm:regular Rate:Normal     Neuro/Psych Depression negative neurological ROS  negative psych ROS   GI/Hepatic Neg liver ROS, PUD, GERD  Medicated and Controlled,Stomach cancer,SP resection    Endo/Other  negative endocrine ROS  Renal/GU negative Renal ROS  negative genitourinary   Musculoskeletal negative musculoskeletal ROS (+)   Abdominal   Peds  Hematology  (+) Blood dyscrasia, anemia , 11/37   Anesthesia Other Findings   Reproductive/Obstetrics                           Anesthesia Physical  Anesthesia Plan  ASA: III  Anesthesia Plan: MAC   Post-op Pain Management:    Induction: Intravenous  Airway Management Planned: Nasal Cannula  Additional Equipment:   Intra-op Plan:   Post-operative Plan:   Informed Consent: I have reviewed the patients History and Physical, chart, labs and discussed the procedure including the risks, benefits and alternatives for the proposed anesthesia with the patient or authorized representative who has indicated his/her understanding and acceptance.   Dental advisory given  Plan Discussed with: CRNA  Anesthesia Plan Comments: (Esophageal dilation)        Anesthesia Quick  Evaluation

## 2015-09-13 NOTE — Progress Notes (Signed)
Oral Chemotherapy Pharmacist Encounter   I spoke with patient for overview of new oral chemotherapy medication: Xeloda. Pt is doing well. The [facility] by patient's [employer]. Pt states he believes he has received several calls from [facility] but was unable to answer. He will call back today  Pt reports he is getting referred to a GI specialist at [facility] and he was told he should not have radiation at this time. Expects to be seen there within the week. For now, it appears radiation and subsequently xeloda treatment may be delayed. Pt is aware of this and understands to wait to start xeloda until radiation begins. He will inform [facility] of this when he calls today  Counseled patient on administration, dosing, side effects, safe handling, and monitoring. Side effects include but not limited to: fatigue, diarrhea, mouth sores, and hand/foot syndrome.  Mr. Mcneilly voiced understanding and appreciation.   All questions answered.  Will follow up with patient regarding insurance and pharmacy. Will follow up in 1-2 weeks for adherence and toxicity management.   Thank you,  Montel Clock, PharmD, [facility]

## 2015-09-13 NOTE — Telephone Encounter (Signed)
Left message for patient to call back  

## 2015-09-13 NOTE — Patient Instructions (Signed)
[facility] Discharge Instructions for Patients Receiving Chemotherapy  Today you received the following chemotherapy agents;  Leucovorin and Fluorouracil.   To help prevent nausea and vomiting after your treatment, we encourage you to take your nausea medication as directed.    If you develop nausea and vomiting that is not controlled by your nausea medication, call the clinic.   BELOW ARE SYMPTOMS THAT SHOULD BE REPORTED IMMEDIATELY:  *FEVER GREATER THAN 100.5 F  *CHILLS WITH OR WITHOUT FEVER  NAUSEA AND VOMITING THAT IS NOT CONTROLLED WITH YOUR NAUSEA MEDICATION  *UNUSUAL SHORTNESS OF BREATH  *UNUSUAL BRUISING OR BLEEDING  TENDERNESS IN MOUTH AND THROAT WITH OR WITHOUT PRESENCE OF ULCERS  *URINARY PROBLEMS  *BOWEL PROBLEMS  UNUSUAL RASH Items with * indicate a potential emergency and should be followed up as soon as possible.  Feel free to call the clinic you have any questions or concerns. The clinic phone number is [phone].  Please show the CHEMO ALERT CARD at check-in to the Emergency Department and triage nurse.   

## 2015-09-14 NOTE — Telephone Encounter (Signed)
Patient notified of the appt date and time 

## 2015-09-17 ENCOUNTER — Encounter (HOSPITAL_COMMUNITY): Payer: Self-pay | Admitting: *Deleted

## 2015-09-19 ENCOUNTER — Ambulatory Visit: Payer: Medicare Other

## 2015-09-19 ENCOUNTER — Encounter: Payer: Medicare Other | Admitting: Nutrition

## 2015-09-20 ENCOUNTER — Ambulatory Visit: Payer: Medicare Other

## 2015-09-20 ENCOUNTER — Encounter: Payer: Medicare Other | Admitting: Nutrition

## 2015-09-20 ENCOUNTER — Other Ambulatory Visit (HOSPITAL_BASED_OUTPATIENT_CLINIC_OR_DEPARTMENT_OTHER): Payer: Medicare Other

## 2015-09-20 ENCOUNTER — Ambulatory Visit (HOSPITAL_BASED_OUTPATIENT_CLINIC_OR_DEPARTMENT_OTHER): Payer: Medicare Other | Admitting: Oncology

## 2015-09-20 ENCOUNTER — Telehealth: Payer: Self-pay | Admitting: Oncology

## 2015-09-20 VITALS — BP 135/82 | HR 68 | Temp 97.7°F | Resp 18 | Ht 67.0 in | Wt 173.9 lb

## 2015-09-20 DIAGNOSIS — C169 Malignant neoplasm of stomach, unspecified: Secondary | ICD-10-CM

## 2015-09-20 DIAGNOSIS — D701 Agranulocytosis secondary to cancer chemotherapy: Secondary | ICD-10-CM | POA: Diagnosis not present

## 2015-09-20 DIAGNOSIS — C165 Malignant neoplasm of lesser curvature of stomach, unspecified: Secondary | ICD-10-CM

## 2015-09-20 LAB — CBC WITH DIFFERENTIAL/PLATELET
BASO%: 0.3 % (ref 0.0–2.0)
BASOS ABS: 0 10*3/uL (ref 0.0–0.1)
EOS%: 11 % — ABNORMAL HIGH (ref 0.0–7.0)
Eosinophils Absolute: 0.4 10*3/uL (ref 0.0–0.5)
HCT: 36.2 % — ABNORMAL LOW (ref 38.4–49.9)
HGB: 11.6 g/dL — ABNORMAL LOW (ref 13.0–17.1)
LYMPH#: 1.6 10*3/uL (ref 0.9–3.3)
LYMPH%: 45.5 % (ref 14.0–49.0)
MCH: 25.4 pg — ABNORMAL LOW (ref 27.2–33.4)
MCHC: 32 g/dL (ref 32.0–36.0)
MCV: 79.4 fL (ref 79.3–98.0)
MONO#: 0.4 10*3/uL (ref 0.1–0.9)
MONO%: 10.7 % (ref 0.0–14.0)
NEUT%: 32.5 % — AB (ref 39.0–75.0)
NEUTROS ABS: 1.2 10*3/uL — AB (ref 1.5–6.5)
Platelets: 97 10*3/uL — ABNORMAL LOW (ref 140–400)
RBC: 4.56 10*6/uL (ref 4.20–5.82)
RDW: 19.8 % — ABNORMAL HIGH (ref 11.0–14.6)
WBC: 3.6 10*3/uL — ABNORMAL LOW (ref 4.0–10.3)

## 2015-09-20 NOTE — Telephone Encounter (Signed)
s.w. pt and advised on DEC and Jan appt.[name] KitchenMarland KitchenMarland KitchenMarland Kitchenpt ok adn aware

## 2015-09-20 NOTE — Progress Notes (Signed)
  [place] OFFICE PROGRESS NOTE   Diagnosis: Gastric cancer  INTERVAL HISTORY:   Mr. Brodt returns as scheduled. He has completed 3 treatments with 5-FU/leucovorin. No mouth sores, nausea, or diarrhea. He is seeing Dr. Carlean Purl for repeat esophageal dilatation procedures. He is able to eat at present. He reports pain at the feeding tube skin exit site for the past several days. He continues nighttime tube feedings.  Objective:  Vital signs in last 24 hours:  Blood pressure 135/82, pulse 68, temperature 97.7 F (36.5 C), temperature source Oral, resp. rate 18, height 5\' 7"  (1.702 m), weight 173 lb 14.4 oz (78.881 kg), SpO2 100 %.    HEENT: No thrush or ulcers Resp: Lungs clear bilaterally Cardio: Regular rate and rhythm GI: No hepatomegaly, no mass, erythema/induration with tenderness at the feeding tube skin exit. No fluctuance. Vascular: No leg edema  Skin: Palms without erythema     Lab Results:  Lab Results  Component Value Date   WBC 3.6* 09/20/2015   HGB 11.6* 09/20/2015   HCT 36.2* 09/20/2015   MCV 79.4 09/20/2015   PLT 97* 09/20/2015   NEUTROABS 1.2* 09/20/2015     Medications: I have reviewed the patient's current medications.  Assessment/Plan: 1. Gastric cancer-adenocarcinoma of the gastric fundus/cardia  Staging CT scans 06/07/2015 with no evidence of distant metastatic disease, tiny nonspecific pulmonary nodules and liver lesions-likely benign  Subtotal gastrectomy and placement of a jejunostomy feeding tube 07/18/2015 for a T2,N0 tumor with negative surgical margins  Initiation of adjuvant 5-FU/leucovorin 08/28/2015, status post 3 weekly treatments completed 09/13/2015 2. Microcytic anemia-likely iron deficiency anemia secondary to #1  3. Possible Vitamin B-12 deficiency, based on high In him a level -status post vitamin B-12 replacement, plan to repeat a vitamin B 12 level in 3-6 months  4. T1c prostate cancer-status post external  beam radiation completed April 2016  5. 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    Mild neutropenia/thrombocytopenia secondary to chemotherapy  8.     Erythema/tenderness at the jejunostomy feeding tube site 09/20/2015  Disposition:  Mr. Monterrosa has completed 3 treatments with 5-FU/leucovorin. He has tolerated the chemotherapy well. He has mild neutropenia and thrombocytopenia today. We decided to hold week 4 secondary to the hematologic toxicity and the potential for infection at the jejunostomy feeding tube site. He is also undergoing repeat esophageal dilatation procedures.  We referred him to the surgical clinic to evaluate the jejunostomy site.  Dr. Carlean Purl will decide when it is appropriate to initiate adjuvant Xeloda/radiation.  We will check a CBC 10/02/2015. Mr. Sutphen is scheduled for an office visit 10/16/2015. Betsy Coder, MD  09/20/2015  4:19 PM

## 2015-09-20 NOTE — Progress Notes (Signed)
Spoke with Enid Derry at Dr. Marlowe Aschoff office, pt will be worked in to see APP at 1400 to have G-tube site evaluated.

## 2015-09-21 ENCOUNTER — Other Ambulatory Visit: Payer: Self-pay

## 2015-09-21 ENCOUNTER — Ambulatory Visit (HOSPITAL_COMMUNITY): Payer: Medicare Other | Admitting: Anesthesiology

## 2015-09-21 ENCOUNTER — Ambulatory Visit
Admission: RE | Admit: 2015-09-21 | Discharge: 2015-09-21 | Disposition: A | Discharge status: Home / Self Care | Payer: Medicare Other | Source: Ambulatory Visit | Attending: Radiation Oncology | Admitting: Radiation Oncology

## 2015-09-21 ENCOUNTER — Encounter (HOSPITAL_COMMUNITY)
Admission: RE | Disposition: A | Discharge status: Home / Self Care | Payer: Self-pay | Source: Ambulatory Visit | Attending: Internal Medicine

## 2015-09-21 ENCOUNTER — Encounter (HOSPITAL_COMMUNITY): Payer: Self-pay

## 2015-09-21 ENCOUNTER — Ambulatory Visit (HOSPITAL_COMMUNITY)
Admission: RE | Admit: 2015-09-21 | Discharge: 2015-09-21 | Disposition: A | Discharge status: Home / Self Care | Payer: Medicare Other | Source: Ambulatory Visit | Attending: Internal Medicine | Admitting: Internal Medicine

## 2015-09-21 ENCOUNTER — Ambulatory Visit: Admission: RE | Admit: 2015-09-21 | Payer: Medicare Other | Source: Ambulatory Visit | Admitting: Radiation Oncology

## 2015-09-21 DIAGNOSIS — Z79899 Other long term (current) drug therapy: Secondary | ICD-10-CM | POA: Insufficient documentation

## 2015-09-21 DIAGNOSIS — Z87891 Personal history of nicotine dependence: Secondary | ICD-10-CM | POA: Insufficient documentation

## 2015-09-21 DIAGNOSIS — J45909 Unspecified asthma, uncomplicated: Secondary | ICD-10-CM | POA: Diagnosis not present

## 2015-09-21 DIAGNOSIS — K769 Liver disease, unspecified: Secondary | ICD-10-CM

## 2015-09-21 DIAGNOSIS — Z85028 Personal history of other malignant neoplasm of stomach: Secondary | ICD-10-CM | POA: Diagnosis not present

## 2015-09-21 DIAGNOSIS — E785 Hyperlipidemia, unspecified: Secondary | ICD-10-CM | POA: Diagnosis not present

## 2015-09-21 DIAGNOSIS — Z923 Personal history of irradiation: Secondary | ICD-10-CM | POA: Insufficient documentation

## 2015-09-21 DIAGNOSIS — Z8711 Personal history of peptic ulcer disease: Secondary | ICD-10-CM | POA: Diagnosis not present

## 2015-09-21 DIAGNOSIS — R1319 Other dysphagia: Secondary | ICD-10-CM | POA: Insufficient documentation

## 2015-09-21 DIAGNOSIS — Z8546 Personal history of malignant neoplasm of prostate: Secondary | ICD-10-CM | POA: Diagnosis not present

## 2015-09-21 DIAGNOSIS — K219 Gastro-esophageal reflux disease without esophagitis: Secondary | ICD-10-CM | POA: Insufficient documentation

## 2015-09-21 DIAGNOSIS — Z01812 Encounter for preprocedural laboratory examination: Secondary | ICD-10-CM

## 2015-09-21 DIAGNOSIS — R1310 Dysphagia, unspecified: Secondary | ICD-10-CM | POA: Diagnosis not present

## 2015-09-21 DIAGNOSIS — K222 Esophageal obstruction: Secondary | ICD-10-CM | POA: Diagnosis present

## 2015-09-21 DIAGNOSIS — R188 Other ascites: Secondary | ICD-10-CM

## 2015-09-21 DIAGNOSIS — K7460 Unspecified cirrhosis of liver: Secondary | ICD-10-CM

## 2015-09-21 DIAGNOSIS — K7031 Alcoholic cirrhosis of liver with ascites: Secondary | ICD-10-CM

## 2015-09-21 HISTORY — PX: BALLOON DILATION: SHX5330

## 2015-09-21 HISTORY — PX: ESOPHAGOGASTRODUODENOSCOPY (EGD) WITH PROPOFOL: SHX5813

## 2015-09-21 SURGERY — ESOPHAGOGASTRODUODENOSCOPY (EGD) WITH PROPOFOL
Anesthesia: Monitor Anesthesia Care

## 2015-09-21 MED ORDER — PROPOFOL 10 MG/ML IV BOLUS
INTRAVENOUS | Status: AC
  Filled 2015-09-21: qty 40

## 2015-09-21 MED ORDER — SODIUM CHLORIDE 0.9 % IV SOLN: INTRAVENOUS | Status: DC

## 2015-09-21 MED ORDER — LIDOCAINE HCL (CARDIAC) 20 MG/ML IV SOLN
INTRAVENOUS | Status: AC
  Filled 2015-09-21: qty 5

## 2015-09-21 MED ORDER — PROPOFOL 10 MG/ML IV BOLUS
INTRAVENOUS | Status: DC | PRN
  Administered 2015-09-21 (×2): 30 mg via INTRAVENOUS
  Administered 2015-09-21 (×2): 20 mg via INTRAVENOUS

## 2015-09-21 MED ORDER — LIDOCAINE HCL (CARDIAC) 20 MG/ML IV SOLN
INTRAVENOUS | Status: DC | PRN
  Administered 2015-09-21 (×2): 50 mg via INTRAVENOUS

## 2015-09-21 MED ORDER — MEPERIDINE HCL 100 MG/ML IJ SOLN: 6.2500 mg | INTRAMUSCULAR | Status: DC | PRN

## 2015-09-21 MED ORDER — PROPOFOL 500 MG/50ML IV EMUL
INTRAVENOUS | Status: DC | PRN
  Administered 2015-09-21: 100 ug/kg/min via INTRAVENOUS

## 2015-09-21 MED ORDER — PROMETHAZINE HCL 25 MG/ML IJ SOLN: 6.2500 mg | INTRAMUSCULAR | Status: DC | PRN

## 2015-09-21 MED ORDER — LACTATED RINGERS IV SOLN
INTRAVENOUS | Status: DC
  Administered 2015-09-21: 1000 mL via INTRAVENOUS

## 2015-09-21 MED ORDER — FENTANYL CITRATE (PF) 100 MCG/2ML IJ SOLN: 25.0000 ug | INTRAMUSCULAR | Status: DC | PRN

## 2015-09-21 SURGICAL SUPPLY — 14 items

## 2015-09-21 NOTE — Transfer of Care (Signed)
Immediate Anesthesia Transfer of Care Note  Patient: James [name]  Procedure(s) Performed: Procedure(s): ESOPHAGOGASTRODUODENOSCOPY (EGD) WITH PROPOFOL (N/A) BALLOON DILATION (N/A)  Patient Location: PACU and Endoscopy Unit  Anesthesia Type:MAC  Level of Consciousness: awake, alert  and patient cooperative  Airway & Oxygen Therapy: Patient Spontanous Breathing and Patient connected to nasal cannula oxygen  Post-op Assessment: Report given to RN and Post -op Vital signs reviewed and stable  Post vital signs: Reviewed and stable  Last Vitals:  Filed Vitals:   09/21/15 0719  BP: 127/86  Pulse: 61  Temp: 36.7 C  Resp: 12    Complications: No apparent anesthesia complications

## 2015-09-21 NOTE — H&P (Signed)
[facility] History and Physical   Primary Care Physician:  [name], MD   Reason for Procedure:  Dilate stricture  Plan:    EGD dilate esophagus The risks and benefits as well as alternatives of endoscopic procedure(s) have been discussed and reviewed. All questions answered. The patient agrees to proceed.      HPI: James [name] is a 69 y.o. male with anastomotic esophageal-gastric stricture requiring dilation for repeat.   Past Medical History  Diagnosis Date  . Abnormal EKG     NS ST-T EKG Changes, (-) Nuclear Stress Test 03/2006  . Hyperlipemia   . Duodenal ulcer 1972    transfusion required, 1 unit packed cells  . Hx of basal cell carcinoma   . S/P radiation therapy 11/29/2014 through 01/23/2015     Prostate 7800 cGy in 40 sessions, seminal vesicles 5600 cGy in 40 sessions   . B12 deficiency 06/05/2015    B12 Low NL at 271 methtylmalonic acid high  . Transfusion history     '72 -s/p surgery for duodenal ulcer  . Anemia     iron deficency  . Prostate cancer ([place]) 09/06/14    last radiation 4'16   . Skin cancer, basal cell   . Cardiac murmur     nothing to be concerned with   . Asthma   . Esophageal reflux     no currlently  . Gastric cancer ([place]) 07/18/15    invasive adenocarcinoma  . Allergy     Past Surgical History  Procedure Laterality Date  . Trigger finger release Left 1998  . Rotator cuff repair Right     R shoulder, [place]  . Knee arthroscopy Right     Right Knee, [facility] [facility]  . Colonoscopy with polypectomy  02/2012    Dr Sharlett Iles  . Prostate biopsy  09/06/14  . Cataract extraction, bilateral Bilateral   . Vasectomy    . Eus N/A 06/14/2015    Procedure: ESOPHAGEAL ENDOSCOPIC ULTRASOUND (EUS) RADIAL;  Surgeon: Milus Banister, MD;  Location: [facility];  Service: Endoscopy;  Laterality: N/A;  . Laparoscopy N/A 07/18/2015    Procedure: LAPAROSCOPY  DIAGNOSTIC;  Surgeon: Stark Klein, MD;  Location: [facility];  Service: General;  Laterality: N/A;  . Laparoscopic gastrectomy N/A 07/18/2015    Procedure: SUB TOTAL GASTRECTOMY;  Surgeon: Stark Klein, MD;  Location: [facility];  Service: General;  Laterality: N/A;  . Laparoscopic gastrostomy N/A 07/18/2015    Procedure: FEEDING TUBE PLACEMENT;  Surgeon: Stark Klein, MD;  Location: [facility];  Service: General;  Laterality: N/A;  . Esophagogastroduodenoscopy (egd) with propofol N/A 08/17/2015    Procedure: ESOPHAGOGASTRODUODENOSCOPY (EGD) WITH PROPOFOL;  Surgeon: Gatha Mayer, MD;  Location: [facility];  Service: Endoscopy;  Laterality: N/A;  . Esophagogastroduodenoscopy (egd) with propofol N/A 09/12/2015    Procedure: ESOPHAGOGASTRODUODENOSCOPY (EGD) WITH PROPOFOL;  Surgeon: Gatha Mayer, MD;  Location: [facility];  Service: Endoscopy;  Laterality: N/A;  . Balloon dilation N/A 09/12/2015    Procedure: BALLOON DILATION;  Surgeon: Gatha Mayer, MD;  Location: [facility];  Service: Endoscopy;  Laterality: N/A;    Prior to Admission medications   Medication Sig Start Date End Date Taking? Authorizing Provider  capecitabine (XELODA) 500 MG tablet Take 3 tablets (1,500 mg total) by mouth 2 (two) times daily after a meal. Take on days of Radiation Only 09/24/15  Yes Ladell Pier, MD  ferrous sulfate 325 (65 FE) MG EC tablet Take 1 tablet (325 mg  total) by mouth daily. 09/06/15  Yes Ladell Pier, MD  Nutritional Supplements (FEEDING SUPPLEMENT, JEVITY 1.5 CAL,) LIQD Place 1,000 mLs into feeding tube continuous. Put into J tube via pump at 40cc/h Patient taking differently: Place 1,000 mLs into feeding tube continuous. Put into J tube via pump at 65cc/h- 12 hours a day 07/24/15  Yes Arta Bruce Kinsinger, MD  omeprazole (PRILOSEC) 40 MG capsule Take 1 capsule (40 mg total) by mouth 2 (two) times daily before a meal. 30 MINS BEFORE BREAKFAST/SUPPER 08/17/15  Yes Gatha Mayer, MD  pravastatin  (PRAVACHOL) 40 MG tablet Take 1 tablet (40 mg total) by mouth daily. HOLD UNTIL SWALLOWING BETTER 08/17/15  Yes Gatha Mayer, MD  Water For Irrigation, Sterile (FREE WATER) SOLN Place 240 mLs into feeding tube 4 (four) times daily. Patient taking differently: Place 240 mLs into feeding tube 2 (two) times daily.  07/24/15  Yes Mickeal Skinner, MD    Current Facility-Administered Medications  Medication Dose Route Frequency Provider Last Rate Last Dose  . 0.9 %  sodium chloride infusion   Intravenous Continuous Gatha Mayer, MD      . 0.9 %  sodium chloride infusion   Intravenous Continuous Gatha Mayer, MD      . fentaNYL (SUBLIMAZE) injection 25-50 mcg  25-50 mcg Intravenous Q5 min PRN Alexis Frock, MD      . lactated ringers infusion   Intravenous Continuous [name], MD 125 mL/hr at 09/21/15 0730 1,000 mL at 09/21/15 0730  . meperidine (DEMEROL) injection 6.25-12.5 mg  6.25-12.5 mg Intravenous Q5 min PRN Alexis Frock, MD      . promethazine (PHENERGAN) injection 6.25-12.5 mg  6.25-12.5 mg Intravenous Q15 min PRN [name], MD        Allergies as of 09/12/2015 - Review Complete 09/12/2015  Allergen Reaction Noted  . Codeine      Family History  Problem Relation Age of Onset  . Diabetes Father   . Heart attack Father 72  . Liver disease Father     Related to Alcohol Use   . Alcohol abuse Mother   . Cirrhosis Mother     died in her 62s  . Multiple myeloma Brother   . Colon cancer Neg Hx   . Stomach cancer Neg Hx   . Stroke Neg Hx   . Diabetes Sister     Social History   Social History  . Marital Status: Married    Spouse Name: N/A  . Number of Children: 2  . Years of Education: N/A   Occupational History  . retired    Social History Main Topics  . Smoking status: Former Smoker -- 0.50 packs/day for 5 years    Types: Cigarettes    Quit date: 10/07/1967  . Smokeless tobacco: Never Used     Comment: smoked [place] , up to < 1 ppd  .  Alcohol Use: 0.0 oz/week     Comment:  < 3 / week- none in   month  . Drug Use: No  . Sexual Activity: Not on file   Other Topics Concern  . Not on file   Social History Narrative   Married and retired - 1 son and 1 daughter    Lives at [place] and active golfer, tennis player   05/30/2015       Review of Systems: Positive for painful j tube All other review of systems negative except as mentioned in the HPI.  Physical Exam:  Vital signs in last 24 hours: Temp:  [97.7 F (36.5 C)-98 F (36.7 C)] 98 F (36.7 C) (12/16 0719) Pulse Rate:  [61-68] 61 (12/16 0719) Resp:  [12-18] 12 (12/16 0719) BP: (127-135)/(82-86) 127/86 mmHg (12/16 0719) SpO2:  [100 %] 100 % (12/16 0719) Weight:  [173 lb 14.4 oz (78.881 kg)-175 lb (79.379 kg)] 175 lb (79.379 kg) (12/16 0719)   General:   Alert,  Well-developed, well-nourished, pleasant and cooperative in NAD Lungs:  Clear throughout to auscultation.   Heart:  Regular rate and rhythm; no murmurs, clicks, rubs,  or gallops. Abdomen:  Soft, nontender and nondistended. Normal bowel sounds.   Neuro/Psych:  Alert and cooperative. Normal mood and affect. A and O x 3   @Carl  Simonne Maffucci, MD, [facility] [phone] (pager) 09/21/2015 7:56 AM@

## 2015-09-21 NOTE — Op Note (Signed)
[facility] [place], [address]   ENDOSCOPY PROCEDURE REPORT  PATIENT: James, [name]  MR#: [id number] BIRTHDATE: 1945-11-19 , 53  yrs. old GENDER: male ENDOSCOPIST: Gatha Mayer, MD, [facility] PROCEDURE DATE:  09/21/2015 PROCEDURE:  Esophagoscopy  + balloon dilation < 30 mm ASA CLASS:     Class III INDICATIONS:  therapeutic procedure. MEDICATIONS: Monitored anesthesia care and Per Anesthesia TOPICAL ANESTHETIC: none  DESCRIPTION OF PROCEDURE: After the risks benefits and alternatives of the procedure were thoroughly explained, informed consent was obtained.  The Pentax Gastroscope [id number] endoscope was introduced through the mouth and advanced to the stomach antrum , Without limitations.  The instrument was slowly withdrawn as the mucosa was fully examined.    1) Anastomotic stricture seen again - more open than 10 d ago but scope would not pass until dilated 15,16.5 and 18 mm x 2 min each. 2) Otherwise normal post op anaotomy esophagus and stomach. Retroflexion was not performed.     The scope was then withdrawn from the patient and the procedure completed.  COMPLICATIONS: There were no immediate complications.  ENDOSCOPIC IMPRESSION: 1) Anastomotic stricture seen again - more open than 10 d ago but scope would not pass until dilated 15,16.5 and 18 mm x 2 min each. 2) Otherwise normal post op anaotomy esophagus and stomach  RECOMMENDATIONS: 1.  I will talk to him next week - he may need another dilation before he goes to see Dr.  Lysle Rubens at [facility] 1/5 for dilation/incision of stricture 2.  Hold off on XRT until Jan appt at [facility]   eSigned:  Gatha Mayer, MD, [facility] 09/21/2015 8:39 AM    CC:The Patient, Dr. Julieanne Manson, Dr. Kyung Rudd, Dr. Gayleen Orem, Dr. Stark Klein

## 2015-09-21 NOTE — Discharge Instructions (Addendum)
° °  It was more open but still required dilation to 18 mm again.  Let's talk next week to think about need for another dilation before Jan 5 visit to Dr. Lysle Rubens.  I think you should continue to hold off on radiation.  I appreciate the opportunity to care for you. Gatha Mayer, MD, [facility]   Esophagogastroduodenoscopy, Care After Refer to this sheet in the next few weeks. These instructions provide you with information about caring for yourself after your procedure. Your health care provider may also give you more specific instructions. Your treatment has been planned according to current medical practices, but problems sometimes occur. Call your health care provider if you have any problems or questions after your procedure. WHAT TO EXPECT AFTER THE PROCEDURE After your procedure, it is typical to feel:  Soreness in your throat.  Pain with swallowing.  Sick to your stomach (nauseous).  Bloated.  Dizzy.  Fatigued. HOME CARE INSTRUCTIONS  Do not eat or drink anything until the numbing medicine (local anesthetic) has worn off and your gag reflex has returned. You will know that the local anesthetic has worn off when you can swallow comfortably.  Do not drive or operate machinery until directed by your health care provider.  Take medicines only as directed by your health care provider. SEEK MEDICAL CARE IF:   You cannot stop coughing.  You are not urinating at all or less than usual. SEEK IMMEDIATE MEDICAL CARE IF:  You have difficulty swallowing.  You cannot eat or drink.  You have worsening throat or chest pain.  You have dizziness or lightheadedness or you faint.  You have nausea or vomiting.  You have chills.  You have a fever.  You have severe abdominal pain.  You have black, tarry, or bloody stools.   This information is not intended to replace advice given to you by your health care provider. Make sure you discuss any questions you have with your  health care provider.   Document Released: 09/08/2012 Document Revised: 10/13/2014 Document Reviewed: 09/08/2012 Elsevier Interactive Patient Education [employer].

## 2015-09-21 NOTE — Anesthesia Postprocedure Evaluation (Signed)
Anesthesia Post Note  Patient: James [name]  Procedure(s) Performed: Procedure(s) (LRB): ESOPHAGOGASTRODUODENOSCOPY (EGD) WITH PROPOFOL (N/A) BALLOON DILATION (N/A)  Patient location during evaluation: Endoscopy Anesthesia Type: MAC Level of consciousness: awake and alert Pain management: pain level controlled Vital Signs Assessment: post-procedure vital signs reviewed and stable Respiratory status: spontaneous breathing, nonlabored ventilation, respiratory function stable and patient connected to nasal cannula oxygen Cardiovascular status: blood pressure returned to baseline and stable Postop Assessment: no signs of nausea or vomiting Anesthetic complications: no    Last Vitals:  Filed Vitals:   09/21/15 0840 09/21/15 0850  BP: 133/72 131/87  Pulse: 59 60  Temp:    Resp: 19 18    Last Pain: There were no vitals filed for this visit.               Bana Borgmeyer

## 2015-09-24 ENCOUNTER — Encounter (HOSPITAL_COMMUNITY): Payer: Self-pay | Admitting: Internal Medicine

## 2015-09-26 ENCOUNTER — Other Ambulatory Visit: Payer: Self-pay

## 2015-09-26 ENCOUNTER — Encounter (HOSPITAL_COMMUNITY): Payer: Self-pay | Admitting: *Deleted

## 2015-09-26 ENCOUNTER — Telehealth: Payer: Self-pay

## 2015-09-26 DIAGNOSIS — K222 Esophageal obstruction: Secondary | ICD-10-CM

## 2015-09-26 NOTE — Telephone Encounter (Signed)
-----   Message from Milus Banister, MD sent at 09/26/2015  9:38 AM EST ----- Regarding: RE: balloon dilation appt Glendell Docker, thanks.  We'll get this set up  Dwon Sky, thanks.  EGD with dilation next Thursday with MAC at [facility].  Thanks   ----- Message -----    From: Gatha Mayer, MD    Sent: 09/26/2015   8:34 AM      To: Milus Banister, MD, Barron Alvine, CMA Subject: balloon dilation appt                          Malerie Eakins,  Dr. Ardis Hughs has kindly agreed to balloon dilate esoph stricture in Mr. Clemenson in my absence on Thurs 12/29 - at [facility]  He actually can also be done in [facility] (I have when I had a spot) next week  if desired but Thurs probably best time frame  Am sending to Dr. Ardis Hughs also for doube check abnd then if you would set up and let Mr. Suire know. I told Mr. Kromah this was likely and Dr. Ardis Hughs did his EUS  CEG

## 2015-09-26 NOTE — Telephone Encounter (Signed)
EGD scheduled, pt instructed and medications reviewed.  Patient instructions mailed to home.  Patient to call with any questions or concerns.  

## 2015-09-26 NOTE — Telephone Encounter (Signed)
Pt has been scheduled for 10/04/15 1030 am at [facility] instructions in My Chart Left message on machine to call back

## 2015-10-02 ENCOUNTER — Other Ambulatory Visit (HOSPITAL_BASED_OUTPATIENT_CLINIC_OR_DEPARTMENT_OTHER): Payer: Medicare Other

## 2015-10-02 DIAGNOSIS — C165 Malignant neoplasm of lesser curvature of stomach, unspecified: Secondary | ICD-10-CM

## 2015-10-02 LAB — CBC WITH DIFFERENTIAL/PLATELET
BASO%: 0.9 % (ref 0.0–2.0)
Basophils Absolute: 0 10*3/uL (ref 0.0–0.1)
EOS ABS: 0.6 10*3/uL — AB (ref 0.0–0.5)
EOS%: 14.3 % — ABNORMAL HIGH (ref 0.0–7.0)
HCT: 38.8 % (ref 38.4–49.9)
HGB: 12.4 g/dL — ABNORMAL LOW (ref 13.0–17.1)
LYMPH%: 37.7 % (ref 14.0–49.0)
MCH: 26.2 pg — AB (ref 27.2–33.4)
MCHC: 31.8 g/dL — AB (ref 32.0–36.0)
MCV: 82.3 fL (ref 79.3–98.0)
MONO#: 0.4 10*3/uL (ref 0.1–0.9)
MONO%: 10.4 % (ref 0.0–14.0)
NEUT#: 1.6 10*3/uL (ref 1.5–6.5)
NEUT%: 36.7 % — AB (ref 39.0–75.0)
PLATELETS: 121 10*3/uL — AB (ref 140–400)
RBC: 4.71 10*6/uL (ref 4.20–5.82)
RDW: 27.1 % — ABNORMAL HIGH (ref 11.0–14.6)
WBC: 4.3 10*3/uL (ref 4.0–10.3)
lymph#: 1.6 10*3/uL (ref 0.9–3.3)

## 2015-10-04 ENCOUNTER — Ambulatory Visit (HOSPITAL_COMMUNITY): Payer: Medicare Other | Admitting: Anesthesiology

## 2015-10-04 ENCOUNTER — Ambulatory Visit (HOSPITAL_COMMUNITY)
Admission: RE | Admit: 2015-10-04 | Discharge: 2015-10-04 | Disposition: A | Discharge status: Home / Self Care | Payer: Medicare Other | Source: Ambulatory Visit | Attending: Gastroenterology | Admitting: Gastroenterology

## 2015-10-04 ENCOUNTER — Encounter (HOSPITAL_COMMUNITY)
Admission: RE | Disposition: A | Discharge status: Home / Self Care | Payer: Self-pay | Source: Ambulatory Visit | Attending: Gastroenterology

## 2015-10-04 ENCOUNTER — Encounter (HOSPITAL_COMMUNITY): Payer: Self-pay

## 2015-10-04 DIAGNOSIS — I252 Old myocardial infarction: Secondary | ICD-10-CM | POA: Diagnosis not present

## 2015-10-04 DIAGNOSIS — K222 Esophageal obstruction: Secondary | ICD-10-CM | POA: Diagnosis present

## 2015-10-04 DIAGNOSIS — Z9221 Personal history of antineoplastic chemotherapy: Secondary | ICD-10-CM | POA: Insufficient documentation

## 2015-10-04 DIAGNOSIS — Z87891 Personal history of nicotine dependence: Secondary | ICD-10-CM | POA: Insufficient documentation

## 2015-10-04 DIAGNOSIS — Z934 Other artificial openings of gastrointestinal tract status: Secondary | ICD-10-CM | POA: Diagnosis not present

## 2015-10-04 DIAGNOSIS — Z923 Personal history of irradiation: Secondary | ICD-10-CM | POA: Insufficient documentation

## 2015-10-04 DIAGNOSIS — D6959 Other secondary thrombocytopenia: Secondary | ICD-10-CM | POA: Insufficient documentation

## 2015-10-04 DIAGNOSIS — D701 Agranulocytosis secondary to cancer chemotherapy: Secondary | ICD-10-CM | POA: Insufficient documentation

## 2015-10-04 DIAGNOSIS — D509 Iron deficiency anemia, unspecified: Secondary | ICD-10-CM | POA: Diagnosis not present

## 2015-10-04 DIAGNOSIS — Z8711 Personal history of peptic ulcer disease: Secondary | ICD-10-CM | POA: Insufficient documentation

## 2015-10-04 DIAGNOSIS — K219 Gastro-esophageal reflux disease without esophagitis: Secondary | ICD-10-CM | POA: Insufficient documentation

## 2015-10-04 DIAGNOSIS — Z8546 Personal history of malignant neoplasm of prostate: Secondary | ICD-10-CM | POA: Insufficient documentation

## 2015-10-04 DIAGNOSIS — T451X5A Adverse effect of antineoplastic and immunosuppressive drugs, initial encounter: Secondary | ICD-10-CM | POA: Insufficient documentation

## 2015-10-04 DIAGNOSIS — J45909 Unspecified asthma, uncomplicated: Secondary | ICD-10-CM | POA: Diagnosis not present

## 2015-10-04 DIAGNOSIS — Z85028 Personal history of other malignant neoplasm of stomach: Secondary | ICD-10-CM | POA: Insufficient documentation

## 2015-10-04 HISTORY — PX: BALLOON DILATION: SHX5330

## 2015-10-04 HISTORY — PX: ESOPHAGOGASTRODUODENOSCOPY (EGD) WITH PROPOFOL: SHX5813

## 2015-10-04 SURGERY — ESOPHAGOGASTRODUODENOSCOPY (EGD) WITH PROPOFOL
Anesthesia: Monitor Anesthesia Care

## 2015-10-04 MED ORDER — FENTANYL CITRATE (PF) 100 MCG/2ML IJ SOLN
INTRAMUSCULAR | Status: DC | PRN
  Administered 2015-10-04: 50 ug via INTRAVENOUS

## 2015-10-04 MED ORDER — LIDOCAINE HCL (CARDIAC) 20 MG/ML IV SOLN
INTRAVENOUS | Status: DC | PRN
  Administered 2015-10-04: 100 mg via INTRAVENOUS

## 2015-10-04 MED ORDER — LACTATED RINGERS IV SOLN
INTRAVENOUS | Status: DC
  Administered 2015-10-04: 1000 mL via INTRAVENOUS

## 2015-10-04 MED ORDER — PROPOFOL 500 MG/50ML IV EMUL
INTRAVENOUS | Status: DC | PRN
  Administered 2015-10-04: 140 ug/kg/min via INTRAVENOUS

## 2015-10-04 MED ORDER — FENTANYL CITRATE (PF) 100 MCG/2ML IJ SOLN
INTRAMUSCULAR | Status: AC
  Filled 2015-10-04: qty 2

## 2015-10-04 MED ORDER — PROPOFOL 10 MG/ML IV BOLUS
INTRAVENOUS | Status: DC | PRN
  Administered 2015-10-04 (×2): 20 mg via INTRAVENOUS

## 2015-10-04 MED ORDER — LIDOCAINE HCL (CARDIAC) 20 MG/ML IV SOLN
INTRAVENOUS | Status: AC
  Filled 2015-10-04: qty 5

## 2015-10-04 MED ORDER — SODIUM CHLORIDE 0.9 % IV SOLN: INTRAVENOUS | Status: DC

## 2015-10-04 SURGICAL SUPPLY — 14 items

## 2015-10-04 NOTE — Op Note (Signed)
[facility] [place], [address]   ENDOSCOPY PROCEDURE REPORT  PATIENT: James, [name]  MR#: [id number] BIRTHDATE: May 01, 1946 , 77  yrs. old GENDER: male ENDOSCOPIST: Milus Banister, MD REFERRED BY:  Silvano Rusk, MD PROCEDURE DATE:  10/04/2015 PROCEDURE:  EGD w/ balloon dilation ASA CLASS:     Class III INDICATIONS:  GE anastomotic stricture; has undergone several balloon dilations with Dr.  Carlean Purl; due to meet Dr.  Gayleen Orem at [facility] in 1-2 weeks. MEDICATIONS: Monitored anesthesia care TOPICAL ANESTHETIC: none  DESCRIPTION OF PROCEDURE: After the risks benefits and alternatives of the procedure were thoroughly explained, informed consent was obtained.  The [place] [id number] endoscope was introduced through the mouth and advanced to the second portion of the duodenum , Without limitations.  The instrument was slowly withdrawn as the mucosa was fully examined.  The GE anastomotic stricture was noted, the lumen was 7-36mm.  There was a nearby staple visible.  I was not able to advance the adult gastroscope through the stricture prior to dilation.  There was a small bean (pinto?) sitting above the stricture in the distal esophagus.  The stricture was sequentially dilated (15, 18, then 10mm) using a TTS balloon held inflated for 30-60 seconds at each interval.  Following dilation there was the typical superfical tear and self limited oozing of blood.  Retroflexion was not performed. The scope was then withdrawn from the patient and the procedure completed.  COMPLICATIONS: There were no immediate complications.  ENDOSCOPIC IMPRESSION: The GE anastomotic stricture was noted, the lumen was 7-40mm.  There was a nearby staple visible.  I was not able to advance the adult gastroscope through the stricture prior to dilation.  There was a small bean (pinto?) sitting above the stricture in the distal esophagus.  The stricture was sequentially  dilated (15, 18, then 64mm) using a TTS balloon held inflated for 30-60 seconds at each interval.  Following dilation there was the typical superfical tear and self limited oozing of blood  RECOMMENDATIONS: He will continue with plans to meet Dr.  Gayleen Orem in 1-2 weeks.   eSigned:  Milus Banister, MD 10/04/2015 1:07 PM

## 2015-10-04 NOTE — Interval H&P Note (Signed)
History and Physical Interval Note:  10/04/2015 1:08 PM  James [name]  has presented today for surgery, with the diagnosis of esophageal stricture  The various methods of treatment have been discussed with the patient and family. After consideration of risks, benefits and other options for treatment, the patient has consented to  Procedure(s) with comments: ESOPHAGOGASTRODUODENOSCOPY (EGD) WITH PROPOFOL (N/A) - possible stricture BALLOON DILATION (N/A) as a surgical intervention .  The patient's history has been reviewed, patient examined, no change in status, stable for surgery.  I have reviewed the patient's chart and labs.  Questions were answered to the patient's satisfaction.     Milus Banister

## 2015-10-04 NOTE — Transfer of Care (Signed)
Immediate Anesthesia Transfer of Care Note  Patient: James [name]  Procedure(s) Performed: Procedure(s) with comments: ESOPHAGOGASTRODUODENOSCOPY (EGD) WITH PROPOFOL (N/A) - possible stricture BALLOON DILATION (N/A)  Patient Location: Endoscopy Unit  Anesthesia Type:MAC  Level of Consciousness: awake and oriented  Airway & Oxygen Therapy: Patient Spontanous Breathing and Patient connected to nasal cannula oxygen  Post-op Assessment: Report given to RN and Post -op Vital signs reviewed and stable  Post vital signs: Reviewed and stable  Last Vitals:  Filed Vitals:   10/04/15 0920  BP: 114/80  Pulse: 67  Temp: 36.7 C  Resp: 12    Complications: No apparent anesthesia complications

## 2015-10-04 NOTE — Discharge Instructions (Signed)

## 2015-10-04 NOTE — H&P (View-Only) (Signed)
  [name] OFFICE PROGRESS NOTE   Diagnosis: Gastric cancer  INTERVAL HISTORY:   [name] returns as scheduled. He has completed 3 treatments with 5-FU/leucovorin. No mouth sores, nausea, or diarrhea. He is seeing Dr. Carlean Purl for repeat esophageal dilatation procedures. He is able to eat at present. He reports pain at the feeding tube skin exit site for the past several days. He continues nighttime tube feedings.  Objective:  Vital signs in last 24 hours:  Blood pressure 135/82, pulse 68, temperature 97.7 F (36.5 C), temperature source Oral, resp. rate 18, height 5\' 7"  (1.702 m), weight 173 lb 14.4 oz (78.881 kg), SpO2 100 %.    HEENT: No thrush or ulcers Resp: Lungs clear bilaterally Cardio: Regular rate and rhythm GI: No hepatomegaly, no mass, erythema/induration with tenderness at the feeding tube skin exit. No fluctuance. Vascular: No leg edema  Skin: Palms without erythema     Lab Results:  Lab Results  Component Value Date   WBC 3.6* 09/20/2015   HGB 11.6* 09/20/2015   HCT 36.2* 09/20/2015   MCV 79.4 09/20/2015   PLT 97* 09/20/2015   NEUTROABS 1.2* 09/20/2015     Medications: I have reviewed the patient's current medications.  Assessment/Plan: 1. Gastric cancer-adenocarcinoma of the gastric fundus/cardia  Staging CT scans 06/07/2015 with no evidence of distant metastatic disease, tiny nonspecific pulmonary nodules and liver lesions-likely benign  Subtotal gastrectomy and placement of a jejunostomy feeding tube 07/18/2015 for a T2,N0 tumor with negative surgical margins  Initiation of adjuvant 5-FU/leucovorin 08/28/2015, status post 3 weekly treatments completed 09/13/2015 2. Microcytic anemia-likely iron deficiency anemia secondary to #1  3. Possible Vitamin B-12 deficiency, based on high In him a level -status post vitamin B-12 replacement, plan to repeat a vitamin B 12 level in 3-6 months  4. T1c prostate cancer-status post external  beam radiation completed April 2016  5. 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    Mild neutropenia/thrombocytopenia secondary to chemotherapy  8.     Erythema/tenderness at the jejunostomy feeding tube site 09/20/2015  Disposition:  James [name] has completed 3 treatments with 5-FU/leucovorin. He has tolerated the chemotherapy well. He has mild neutropenia and thrombocytopenia today. We decided to hold week 4 secondary to the hematologic toxicity and the potential for infection at the jejunostomy feeding tube site. He is also undergoing repeat esophageal dilatation procedures.  We referred him to the surgical clinic to evaluate the jejunostomy site.  Dr. Carlean Purl will decide when it is appropriate to initiate adjuvant Xeloda/radiation.  We will check a CBC 10/02/2015. Mr. Saldutti is scheduled for an office visit 10/16/2015. Betsy Coder, MD  09/20/2015  4:19 PM

## 2015-10-04 NOTE — Anesthesia Postprocedure Evaluation (Signed)
Anesthesia Post Note  Patient: James [name]  Procedure(s) Performed: Procedure(s) (LRB): ESOPHAGOGASTRODUODENOSCOPY (EGD) WITH PROPOFOL (N/A) BALLOON DILATION (N/A)  Patient location during evaluation: Endoscopy Anesthesia Type: MAC Level of consciousness: awake and alert Pain management: pain level controlled Vital Signs Assessment: post-procedure vital signs reviewed and stable Respiratory status: spontaneous breathing, nonlabored ventilation, respiratory function stable and patient connected to nasal cannula oxygen Cardiovascular status: stable and blood pressure returned to baseline Anesthetic complications: no    Last Vitals:  Filed Vitals:   10/04/15 1320 10/04/15 1330  BP: 134/86 126/78  Pulse: 57 54  Temp:    Resp: 18 17    Last Pain: There were no vitals filed for this visit.               [name]

## 2015-10-04 NOTE — Anesthesia Preprocedure Evaluation (Signed)
Anesthesia Evaluation  Patient identified by MRN, date of birth, ID band Patient awake    Reviewed: Allergy & Precautions, H&P , NPO status , Patient's Chart, lab work & pertinent test results  History of Anesthesia Complications Negative for: history of anesthetic complications  Airway Mallampati: II  TM Distance: >3 FB Neck ROM: full    Dental no notable dental hx. (+) Dental Advisory Given, Caps,    Pulmonary asthma , former smoker,    Pulmonary exam normal breath sounds clear to auscultation       Cardiovascular Exercise Tolerance: Good (-) hypertension(-) angina(-) Past MI negative cardio ROS Normal cardiovascular exam+ Valvular Problems/Murmurs  Rhythm:regular Rate:Normal     Neuro/Psych Depression negative neurological ROS  negative psych ROS   GI/Hepatic Neg liver ROS, PUD, GERD  Medicated and Controlled,Stomach cancer,SP resection    Endo/Other  negative endocrine ROS  Renal/GU negative Renal ROS  negative genitourinary   Musculoskeletal negative musculoskeletal ROS (+)   Abdominal   Peds  Hematology  (+) Blood dyscrasia, anemia , 11/37   Anesthesia Other Findings   Reproductive/Obstetrics                             Anesthesia Physical  Anesthesia Plan  ASA: III  Anesthesia Plan: MAC   Post-op Pain Management:    Induction: Intravenous  Airway Management Planned: Nasal Cannula  Additional Equipment:   Intra-op Plan:   Post-operative Plan:   Informed Consent: I have reviewed the patients History and Physical, chart, labs and discussed the procedure including the risks, benefits and alternatives for the proposed anesthesia with the patient or authorized representative who has indicated his/her understanding and acceptance.   Dental advisory given  Plan Discussed with: CRNA  Anesthesia Plan Comments: (Esophageal dilation)        Anesthesia Quick  Evaluation

## 2015-10-05 ENCOUNTER — Encounter (HOSPITAL_COMMUNITY): Payer: Self-pay | Admitting: Gastroenterology

## 2015-10-11 DIAGNOSIS — K222 Esophageal obstruction: Secondary | ICD-10-CM | POA: Diagnosis not present

## 2015-10-11 DIAGNOSIS — Z980 Intestinal bypass and anastomosis status: Secondary | ICD-10-CM | POA: Diagnosis not present

## 2015-10-11 DIAGNOSIS — R1310 Dysphagia, unspecified: Secondary | ICD-10-CM | POA: Diagnosis not present

## 2015-10-11 DIAGNOSIS — Z9889 Other specified postprocedural states: Secondary | ICD-10-CM | POA: Diagnosis not present

## 2015-10-15 ENCOUNTER — Telehealth: Payer: Self-pay | Admitting: *Deleted

## 2015-10-15 NOTE — Telephone Encounter (Signed)
I have an appointment tomorrow with Ned Card and I do not know why.  Advised his G-tube and further evaluation needed.  Says he will be here.

## 2015-10-16 ENCOUNTER — Telehealth: Payer: Self-pay | Admitting: Pharmacist

## 2015-10-16 ENCOUNTER — Ambulatory Visit (HOSPITAL_BASED_OUTPATIENT_CLINIC_OR_DEPARTMENT_OTHER): Payer: Medicare Other | Admitting: Nurse Practitioner

## 2015-10-16 ENCOUNTER — Telehealth: Payer: Self-pay | Admitting: Oncology

## 2015-10-16 VITALS — BP 125/75 | HR 65 | Temp 98.3°F | Resp 20 | Wt 173.4 lb

## 2015-10-16 DIAGNOSIS — D701 Agranulocytosis secondary to cancer chemotherapy: Secondary | ICD-10-CM

## 2015-10-16 DIAGNOSIS — C165 Malignant neoplasm of lesser curvature of stomach, unspecified: Secondary | ICD-10-CM

## 2015-10-16 NOTE — Telephone Encounter (Signed)
Called patient. Made patient aware of upcoming appointment.       [facility]. °

## 2015-10-16 NOTE — Telephone Encounter (Signed)
10/16/15 - James [name] has now been cleared to start Xeloda with Radiation. Rx for Xeloda was on hold at [facility]. I have called James [name] as well as [facility] to inform them of the update. [facility] will reprocess the prescription and call James [name] for shipping. Plan to start around 10/29/15. James [name] voiced understanding and appreciation for the call.   Thank you,  Montel Clock, PharmD, [facility]

## 2015-10-16 NOTE — Progress Notes (Addendum)
  [facility] OFFICE PROGRESS NOTE   Diagnosis:  Gastric cancer  INTERVAL HISTORY:   James [name] returns as scheduled. He has completed 3 treatments with 5-FU/leucovorin. Last treatment given 09/13/2015. Treatment was held on 09/20/2015 due to mild neutropenia and thrombocytopenia and concern for a possible infection at the feeding tube site. He underwent repeat esophageal dilatation procedures on 09/21/2015 and 10/04/2015. On 10/11/2015 he underwent an upper endoscopy at [facility]. He was found to have benign appearing esophageal stenosis. This was dilated and treated with bipolar cautery.  He denies dysphagia. No odynophagia. He continues to have pain intermittently at the feeding tube site. He estimates 50% of nutrition via tube and 50% orally.  Objective:  Vital signs in last 24 hours:  Blood pressure 125/75, pulse 65, temperature 98.3 F (36.8 C), temperature source Oral, resp. rate 20, weight 173 lb 6 oz (78.642 kg), SpO2 99 %.    HEENT: No thrush or ulcers. Resp: Lungs clear bilaterally. Cardio: Regular rate and rhythm. GI: Abdomen soft and nontender. No hepatomegaly. Left upper quadrant feeding tube site without erythema. Vascular: No leg edema.    Lab Results:  Lab Results  Component Value Date   WBC 4.3 10/02/2015   HGB 12.4* 10/02/2015   HCT 38.8 10/02/2015   MCV 82.3 10/02/2015   PLT 121* 10/02/2015   NEUTROABS 1.6 10/02/2015    Imaging:  No results found.  Medications: I have reviewed the patient's current medications.  Assessment/Plan: 1. Gastric cancer-adenocarcinoma of the gastric fundus/cardia  Staging CT scans 06/07/2015 with no evidence of distant metastatic disease, tiny nonspecific pulmonary nodules and liver lesions-likely benign  Subtotal gastrectomy and placement of a jejunostomy feeding tube 07/18/2015 for a T2,N0 tumor with negative surgical margins  Initiation of adjuvant 5-FU/leucovorin 08/28/2015, status post 3 weekly treatments  completed 09/13/2015 2. Microcytic anemia-likely iron deficiency anemia secondary to #1  3. Possible Vitamin B-12 deficiency, based on high In him a level -status post vitamin B-12 replacement, plan to repeat a vitamin B 12 level in 3-6 months  4. 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 Anastomotic stricture-status post balloon dilatation 08/22/2015, status post electroincision dilation at [facility] 10/11/2015  7. Mild neutropenia/thrombocytopenia secondary to chemotherapy 09/20/2015  8.Erythema/tenderness at the jejunostomy feeding tube site 09/20/2015. No erythema 10/16/2015.   Disposition: James [name] appears stable. He is scheduled for radiation simulation 10/19/2015. He will take concurrent Xeloda. We reviewed potential toxicities associated with Xeloda including nausea, mouth sores, diarrhea, rash, hand-foot syndrome, conjunctivitis, sensitivity to sunlight. He is agreeable to proceed. We anticipate a start date of 10/29/2015.   We will obtain baseline labs when he is here for simulation on 10/19/2015.  He will return for a follow-up visit on 11/12/2015. He will contact the office in the interim with any problems.  Patient seen with Dr. Benay Spice.    Ned Card ANP/GNP-BC   10/16/2015  9:43 AM  This was a shared visit with Ned Card. Dr. Carlean Purl has approved beginning adjuvant radiation. James [name] will take concurrent capecitabine.  James [name] will return for an office visit approximately 2 weeks after starting concurrent therapy.  Julieanne Manson, M.D.

## 2015-10-19 ENCOUNTER — Ambulatory Visit
Admission: RE | Admit: 2015-10-19 | Discharge: 2015-10-19 | Disposition: A | Discharge status: Home / Self Care | Payer: Medicare Other | Source: Ambulatory Visit | Attending: Radiation Oncology | Admitting: Radiation Oncology

## 2015-10-19 ENCOUNTER — Other Ambulatory Visit: Payer: Self-pay | Admitting: *Deleted

## 2015-10-19 DIAGNOSIS — Z87891 Personal history of nicotine dependence: Secondary | ICD-10-CM | POA: Diagnosis not present

## 2015-10-19 DIAGNOSIS — C169 Malignant neoplasm of stomach, unspecified: Secondary | ICD-10-CM

## 2015-10-19 DIAGNOSIS — Z79899 Other long term (current) drug therapy: Secondary | ICD-10-CM | POA: Diagnosis not present

## 2015-10-19 DIAGNOSIS — C165 Malignant neoplasm of lesser curvature of stomach, unspecified: Secondary | ICD-10-CM

## 2015-10-26 ENCOUNTER — Encounter: Payer: Self-pay | Admitting: Oncology

## 2015-10-26 DIAGNOSIS — C165 Malignant neoplasm of lesser curvature of stomach, unspecified: Secondary | ICD-10-CM | POA: Diagnosis not present

## 2015-10-26 NOTE — Progress Notes (Signed)
Per [facility] capecitabine shipped via [facility] 10/25/15

## 2015-10-28 ENCOUNTER — Other Ambulatory Visit: Payer: Self-pay | Admitting: Oncology

## 2015-10-29 ENCOUNTER — Ambulatory Visit: Payer: Medicare Other | Admitting: Radiation Oncology

## 2015-10-29 ENCOUNTER — Inpatient Hospital Stay: Admission: RE | Admit: 2015-10-29 | Payer: Self-pay | Source: Ambulatory Visit

## 2015-10-29 ENCOUNTER — Ambulatory Visit
Admission: RE | Admit: 2015-10-29 | Discharge: 2015-10-29 | Disposition: A | Discharge status: Home / Self Care | Payer: Medicare Other | Source: Ambulatory Visit | Attending: Radiation Oncology | Admitting: Radiation Oncology

## 2015-10-29 DIAGNOSIS — C165 Malignant neoplasm of lesser curvature of stomach, unspecified: Secondary | ICD-10-CM | POA: Diagnosis not present

## 2015-10-30 ENCOUNTER — Ambulatory Visit
Admission: RE | Admit: 2015-10-30 | Discharge: 2015-10-30 | Disposition: A | Discharge status: Home / Self Care | Payer: Medicare Other | Source: Ambulatory Visit | Attending: Radiation Oncology | Admitting: Radiation Oncology

## 2015-10-30 ENCOUNTER — Telehealth: Payer: Self-pay | Admitting: *Deleted

## 2015-10-30 ENCOUNTER — Telehealth: Payer: Self-pay | Admitting: Internal Medicine

## 2015-10-30 ENCOUNTER — Ambulatory Visit: Payer: Medicare Other

## 2015-10-30 DIAGNOSIS — C165 Malignant neoplasm of lesser curvature of stomach, unspecified: Secondary | ICD-10-CM | POA: Insufficient documentation

## 2015-10-30 DIAGNOSIS — Z934 Other artificial openings of gastrointestinal tract status: Secondary | ICD-10-CM | POA: Diagnosis not present

## 2015-10-30 DIAGNOSIS — R1310 Dysphagia, unspecified: Secondary | ICD-10-CM

## 2015-10-30 DIAGNOSIS — K929 Disease of digestive system, unspecified: Secondary | ICD-10-CM | POA: Diagnosis not present

## 2015-10-30 DIAGNOSIS — D849 Immunodeficiency, unspecified: Secondary | ICD-10-CM

## 2015-10-30 DIAGNOSIS — D899 Disorder involving the immune mechanism, unspecified: Principal | ICD-10-CM

## 2015-10-30 DIAGNOSIS — C161 Malignant neoplasm of fundus of stomach: Secondary | ICD-10-CM | POA: Diagnosis not present

## 2015-10-30 DIAGNOSIS — K3189 Other diseases of stomach and duodenum: Secondary | ICD-10-CM | POA: Diagnosis not present

## 2015-10-30 MED ORDER — RADIAPLEXRX EX GEL
Freq: Once | CUTANEOUS | Status: AC
  Administered 2015-10-30: 14:00:00 via TOPICAL

## 2015-10-30 NOTE — Telephone Encounter (Signed)
Wife called statsing when she went to the pharmacy to pick up the zofran, they told her she needed a pre-authorization for this medication, she was given 5 tabs and was told full rx 200.00 if not pre-authorized", they would fax  [place]  The request, I went back and no fax had been sent, so I called the pharmacy again and spoke with the pharmacist and gave him our fax # [phone], he stated [facility]" he would fax over now, wife aware I would be working on this tomorrow in am and will call her when done 3:52 PM

## 2015-10-30 NOTE — Telephone Encounter (Signed)
Pt education done, rad book, my business card, radiaplex gel given, discussed ways to manage side effects, nausea,vomiting,   Diarrhea,Fatigue,urinary changes, pain,  Uses peg feedings at night 10p-7a, c/o being nauseated last night after rad tx, takes xeloda bid, can swallow food and pills, asked for nausea rx, spoke with James Simpson, PA, called to [facility]  , [facility], pharmacist; RX :Zofran 8mg  1 tab oral tid as needed for nausea, #6o tabs dispense and 2 refills patient aware called in rx, cleansed peg site, with normal saline, gause,  gunk around tube, patient stated stitches came out and almost peg fell outI had to put it back,   2:00 PM

## 2015-10-30 NOTE — Telephone Encounter (Signed)
His is taking oral chemo xeloda daily, last IV chemo 3 weeks ago.  He has radiation treatments scheduled daily the first one was yesterday.  He is tentatively scheduled for EGD dil for Thursday 11/01/15 in [place].   Please advise if he needs additional orders

## 2015-10-31 ENCOUNTER — Ambulatory Visit: Payer: Medicare Other

## 2015-10-31 ENCOUNTER — Other Ambulatory Visit (INDEPENDENT_AMBULATORY_CARE_PROVIDER_SITE_OTHER): Payer: Medicare Other

## 2015-10-31 ENCOUNTER — Telehealth: Payer: Self-pay | Admitting: *Deleted

## 2015-10-31 ENCOUNTER — Ambulatory Visit
Admission: RE | Admit: 2015-10-31 | Discharge: 2015-10-31 | Disposition: A | Discharge status: Home / Self Care | Payer: Medicare Other | Source: Ambulatory Visit | Attending: Radiation Oncology | Admitting: Radiation Oncology

## 2015-10-31 DIAGNOSIS — D899 Disorder involving the immune mechanism, unspecified: Secondary | ICD-10-CM

## 2015-10-31 DIAGNOSIS — D849 Immunodeficiency, unspecified: Secondary | ICD-10-CM

## 2015-10-31 DIAGNOSIS — C165 Malignant neoplasm of lesser curvature of stomach, unspecified: Secondary | ICD-10-CM | POA: Diagnosis not present

## 2015-10-31 LAB — CBC WITH DIFFERENTIAL/PLATELET
Basophils Absolute: 0 10*3/uL (ref 0.0–0.1)
Basophils Relative: 0.3 % (ref 0.0–3.0)
EOS PCT: 2.2 % (ref 0.0–5.0)
Eosinophils Absolute: 0.1 10*3/uL (ref 0.0–0.7)
HCT: 41.7 % (ref 39.0–52.0)
Hemoglobin: 13.8 g/dL (ref 13.0–17.0)
LYMPHS ABS: 1.4 10*3/uL (ref 0.7–4.0)
Lymphocytes Relative: 21.6 % (ref 12.0–46.0)
MCHC: 33 g/dL (ref 30.0–36.0)
MCV: 85 fl (ref 78.0–100.0)
MONO ABS: 0.6 10*3/uL (ref 0.1–1.0)
Monocytes Relative: 8.6 % (ref 3.0–12.0)
NEUTROS PCT: 67.3 % (ref 43.0–77.0)
Neutro Abs: 4.3 10*3/uL (ref 1.4–7.7)
Platelets: 195 10*3/uL (ref 150.0–400.0)
RBC: 4.9 Mil/uL (ref 4.22–5.81)
RDW: 21.9 % — AB (ref 11.5–15.5)
WBC: 6.4 10*3/uL (ref 4.0–10.5)

## 2015-10-31 NOTE — Telephone Encounter (Signed)
Patient stopped by to sign consent and EGD paperwork.  He will go to the lab

## 2015-10-31 NOTE — Telephone Encounter (Signed)
Called [facility] pharmacy left voice message  New RX: Compazine 10mg   Take 1 tab(10mg total) every 6 hours as needed for nausea, #30 tabs dispense no refill,can call our office for any questions at [phone] 8:17 AM

## 2015-10-31 NOTE — Telephone Encounter (Signed)
Please have him get a CBC today  - Dr. Benay Spice suggested we do that

## 2015-10-31 NOTE — Progress Notes (Signed)
Quick Note:  Labs ok - My Chart message sent ______

## 2015-10-31 NOTE — Telephone Encounter (Signed)
I left a message with his wife, on his cell phone and with radiation oncology to contact me about signing paperwork and lab work

## 2015-10-31 NOTE — Telephone Encounter (Signed)
Called [phone],spoke with Debbie who answered, asked for fax copy for MD to sign  So patient can get Zofran  Filled James [name] called last night and they were to fax that over, per Debbie she stated"for some reason it didn't get through she will fax over now, thanked her for her time 12:40 PM

## 2015-11-01 ENCOUNTER — Telehealth: Payer: Self-pay | Admitting: *Deleted

## 2015-11-01 ENCOUNTER — Encounter: Payer: Self-pay | Admitting: Internal Medicine

## 2015-11-01 ENCOUNTER — Ambulatory Visit (AMBULATORY_SURGERY_CENTER): Payer: Medicare Other | Admitting: Internal Medicine

## 2015-11-01 ENCOUNTER — Ambulatory Visit: Payer: Medicare Other

## 2015-11-01 ENCOUNTER — Ambulatory Visit
Admission: RE | Admit: 2015-11-01 | Discharge: 2015-11-01 | Disposition: A | Discharge status: Home / Self Care | Payer: Medicare Other | Source: Ambulatory Visit | Attending: Radiation Oncology | Admitting: Radiation Oncology

## 2015-11-01 VITALS — BP 111/68 | HR 60 | Temp 97.7°F | Resp 13 | Ht 67.0 in | Wt 169.0 lb

## 2015-11-01 DIAGNOSIS — K222 Esophageal obstruction: Secondary | ICD-10-CM

## 2015-11-01 DIAGNOSIS — R1310 Dysphagia, unspecified: Secondary | ICD-10-CM

## 2015-11-01 DIAGNOSIS — C165 Malignant neoplasm of lesser curvature of stomach, unspecified: Secondary | ICD-10-CM | POA: Diagnosis not present

## 2015-11-01 MED ORDER — SODIUM CHLORIDE 0.9 % IV SOLN: 500.0000 mL | INTRAVENOUS | Status: DC

## 2015-11-01 NOTE — Patient Instructions (Addendum)
I dilated it to 15.5 mm. Hopefully this will last a while. I will send Dr. Lysle Rubens a copy - I think you will need to see him again.  I appreciate the opportunity to care for you. Gatha Mayer, MD, FACG     YOU HAD AN ENDOSCOPIC PROCEDURE TODAY AT [facility]:   Refer to the procedure report that was given to you for any specific questions about what was found during the examination.  If the procedure report does not answer your questions, please call your gastroenterologist to clarify.  If you requested that your care partner not be given the details of your procedure findings, then the procedure report has been included in a sealed envelope for you to review at your convenience later.  YOU SHOULD EXPECT: Some feelings of bloating in the abdomen. Passage of more gas than usual.  Walking can help get rid of the air that was put into your GI tract during the procedure and reduce the bloating. If you had a lower endoscopy (such as a colonoscopy or flexible sigmoidoscopy) you may notice spotting of blood in your stool or on the toilet paper. If you underwent a bowel prep for your procedure, you may not have a normal bowel movement for a few days.  Please Note:  You might notice some irritation and congestion in your nose or some drainage.  This is from the oxygen used during your procedure.  There is no need for concern and it should clear up in a day or so.  SYMPTOMS TO REPORT IMMEDIATELY:    Following upper endoscopy (EGD)  Vomiting of blood or coffee ground material  New chest pain or pain under the shoulder blades  Painful or persistently difficult swallowing  New shortness of breath  Fever of 100F or higher  Black, tarry-looking stools  For urgent or emergent issues, a gastroenterologist can be reached at any hour by calling [phone].   DIET:  Drink plenty of fluids but you should avoid alcoholic beverages for 24 hours.  Please follow the dilatation  diet the rest of the day.  The dysphagia 3 diet.  ACTIVITY:  You should plan to take it easy for the rest of today and you should NOT DRIVE or use heavy machinery until tomorrow (because of the sedation medicines used during the test).    FOLLOW UP: Our staff will call the number listed on your records the next business day following your procedure to check on you and address any questions or concerns that you may have regarding the information given to you following your procedure. If we do not reach you, we will leave a message.  However, if you are feeling well and you are not experiencing any problems, there is no need to return our call.  We will assume that you have returned to your regular daily activities without incident.  If any biopsies were taken you will be contacted by phone or by letter within the next 1-3 weeks.  Please call us at [phone] if you have not heard about the biopsies in 3 weeks.    SIGNATURES/CONFIDENTIALITY: You and/or your care partner have signed paperwork which will be entered into your electronic medical record.  These signatures attest to the fact that that the information above on your After Visit Summary has been reviewed and is understood.  Full responsibility of the confidentiality of this discharge information lies with you and/or your care-partner.    Handouts were  given to your care partner on esophageal dilatation diet to follow the rest of the day.  Clear liquids until 12;30, then soft foods the rest of the day.  Per Dr. Carlean Purl you have a copy of the Dysphagia 3 diet. You may resume your current medications today. Please follow up with Dr. Lysle Rubens at [facility] as needed.  Call dr. Carlean Purl as needed for recurrent dysphagia. Please call if any questions or concerns.

## 2015-11-01 NOTE — Progress Notes (Signed)
Pt's wife asked if her husband could take a nausea pill.  Pt has an appoint for radiation tx when discharged from here.  Dr. Carlean Purl said yes, ok to take nausea med.  No problems swallowing pill with water.  maw

## 2015-11-01 NOTE — Op Note (Signed)
[facility]  [address] [place], [id number]   ENDOSCOPY PROCEDURE REPORT  PATIENT: James, [name]  MR#: [id number] BIRTHDATE: [birth year] , 65  yrs. old GENDER: male ENDOSCOPIST: Gatha Mayer, MD, [facility] REFERRED BY: PROCEDURE DATE:  11/01/2015 PROCEDURE:  Esophagoscopy  + balloon dilation < 30 mm ASA CLASS:     Class III INDICATIONS:  therapeutic procedure and dysphagia. MEDICATIONS: Propofol 350 mg IV and Monitored anesthesia care TOPICAL ANESTHETIC: none  DESCRIPTION OF PROCEDURE: After the risks benefits and alternatives of the procedure were thoroughly explained, informed consent was obtained.  The LB [id number] endoscope was introduced through the mouth and advanced to the stomach antrum , Without limitations.  The instrument was slowly withdrawn as the mucosa was fully examined.    1) GE junction/anastomosis stricture - inflammatory in appearance, probably 9-10 mm diameter - scope did not pass. 2) balloon dilation 11,12,13,14.5,15.5 mm. 3) Normal gastric remnant.  Retroflexed views revealed as previously described.     The scope was then withdrawn from the patient and the procedure completed.  COMPLICATIONS: There were no immediate complications.  ENDOSCOPIC IMPRESSION: 1) GE junction/anastomosis stricture - inflammatory in appearance, probably 9-10 mm diameter - scope did not pass. 2) balloon dilation 11,12,13,14.5,15.5 mm. 3) Normal gastric remnant  RECOMMENDATIONS: 1.  Clear liquids until 1230, then soft foods rest of day.  Resume prior diet tomorrow. Dysphaga 3 2.  F/u with Dr.  Lysle Rubens at [facility] is needed , call me prn recurrent dysphagia also.    eSigned:  Gatha Mayer, MD, [facility] 11/01/2015 11:25 AM    CC:The Patient, Drs. [place] and [place], Dr. Gayleen Orem

## 2015-11-01 NOTE — Progress Notes (Signed)
Patient awakening,vss,report to rn 

## 2015-11-01 NOTE — Telephone Encounter (Addendum)
Approval for odanasrton approved per [place]  From 10/31/15-10/30/16  [phone] if needed to confirm called [facility] on [address], spoke with pharmacist tech ,yes this went through, will call the patient,thnked male tech,who didn't give name before hanging up, called patient home, spoke with the wife, and informed her that zofran has been approved and can pick up rx, can alternate compazine and zofran,wife thaned me for calling 10:48 AM  10:44 AM

## 2015-11-01 NOTE — Progress Notes (Signed)
No problems noted in the recovery room. maw 

## 2015-11-01 NOTE — Progress Notes (Signed)
Called to room to assist during endoscopic procedure.  Patient ID and intended procedure confirmed with present staff. Received instructions for my participation in the procedure from the performing physician.  

## 2015-11-02 ENCOUNTER — Telehealth: Payer: Self-pay | Admitting: *Deleted

## 2015-11-02 ENCOUNTER — Ambulatory Visit: Payer: Medicare Other

## 2015-11-02 ENCOUNTER — Ambulatory Visit
Admission: RE | Admit: 2015-11-02 | Discharge: 2015-11-02 | Disposition: A | Discharge status: Home / Self Care | Payer: Medicare Other | Source: Ambulatory Visit | Attending: Radiation Oncology | Admitting: Radiation Oncology

## 2015-11-02 ENCOUNTER — Encounter: Payer: Self-pay | Admitting: Radiation Oncology

## 2015-11-02 VITALS — BP 108/69 | HR 67 | Temp 97.7°F | Resp 20 | Wt 173.6 lb

## 2015-11-02 DIAGNOSIS — C165 Malignant neoplasm of lesser curvature of stomach, unspecified: Secondary | ICD-10-CM

## 2015-11-02 DIAGNOSIS — C61 Malignant neoplasm of prostate: Secondary | ICD-10-CM | POA: Diagnosis not present

## 2015-11-02 DIAGNOSIS — N486 Induration penis plastica: Secondary | ICD-10-CM | POA: Diagnosis not present

## 2015-11-02 NOTE — Patient Instructions (Signed)
Contact our office if you have any questions following today's appointment: [phone].  

## 2015-11-02 NOTE — Progress Notes (Signed)
Department of Radiation Oncology  Phone:  [phone] Fax:        [phone]  Weekly Treatment Note    Name: James [name] Date: 11/02/2015 MRN: [id number] DOB: Apr 09, 1946   Diagnosis:     ICD-9-CM ICD-10-CM   1. 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 capecitabine (XELODA) 500 MG tablet Take 3 tablets (1,500 mg total) by mouth 2 (two) times daily after a meal. Take on days of Radiation Only 120 tablet 0  . ferrous sulfate 325 (65 FE) MG EC tablet Take 1 tablet (325 mg total) by mouth daily. 30 tablet 3  . Nutritional Supplements (FEEDING SUPPLEMENT, JEVITY 1.5 CAL,) LIQD Place 1,000 mLs into feeding tube continuous. Put into J tube via pump at 40cc/h (Patient taking differently: Place 1,000 mLs into feeding tube continuous. Put into J tube via pump at 65cc/h- 12 hours a day) 5000 mL 5  . omeprazole (PRILOSEC) 40 MG capsule Take 1 capsule (40 mg total) by mouth 2 (two) times daily before a meal. 30 MINS BEFORE BREAKFAST/SUPPER 60 capsule 2  . ondansetron (ZOFRAN) 8 MG tablet Take 8 mg by mouth every 8 (eight) hours as needed for nausea or vomiting.    . pravastatin (PRAVACHOL) 40 MG tablet Take 1 tablet (40 mg total) by mouth daily. HOLD UNTIL SWALLOWING BETTER 90 tablet 3  . Water For Irrigation, Sterile (FREE WATER) SOLN Place 240 mLs into feeding tube 4 (four) times daily. (Patient taking differently: Place 240 mLs into feeding tube 2 (two) times daily. ) 1000 mL 5  . hyaluronate sodium (RADIAPLEXRX) GEL Apply 1 application topically daily. Reported on 11/02/2015    . prochlorperazine (COMPAZINE) 10 MG tablet Take 10 mg by mouth every 6 (six) hours as needed for nausea or vomiting. Reported on 11/02/2015     No current facility-administered medications for this encounter.     ALLERGIES: Codeine   LABORATORY DATA:  Lab  Results  Component Value Date   WBC 6.4 10/31/2015   HGB 13.8 10/31/2015   HCT 41.7 10/31/2015   MCV 85.0 10/31/2015   PLT 195.0 10/31/2015   Lab Results  Component Value Date   NA 140 09/07/2015   K 4.0 09/07/2015   CL 103 09/07/2015   CO2 29 09/07/2015   Lab Results  Component Value Date   ALT <9 08/23/2015   AST 16 08/23/2015   ALKPHOS 78 08/23/2015   BILITOT 0.31 08/23/2015     NARRATIVE: [name] was seen today for weekly treatment management. The chart was checked and the patient's films were reviewed.  Review of systems the patient reports that he is doing pretty well overall. He underwent endoscopy yesterday with esophageal dilatation. He has been able to tolerate more soft foods this morning. He states that he has been experiencing trouble with nausea following radiation. He has Zofran and what sounds like Compazine at home to try. He denies any emesis. He states that he continues his tube feedings at nighttime for approximately 8 hours, he is hopeful to have his feeding tube removed since that he can get back to playing golf and tennis. He denies any abdominal pain or skin irritation. He states that last week his G-tube nearly expelled from his abdomen and was sutured back to the skin. He denies any chest pain or shortness of breath.  No other complaints or verbalized  PHYSICAL EXAMINATION: weight is 173 lb 9.6 oz (78.744 kg). His oral temperature is 97.7 F (36.5 C). His blood pressure is 108/69 and his pulse is 67. His respiration is 20.       Pain scale 0/10  In general this is a well-appearing Caucasian male in no acute distress. He is alert and oriented 4 and appropriate throughout the examination. Cardiac pulmonary assessment reveals normal effort without distress. His abdomen is evaluated and his G-tube site is intact. No evidence of skin breakdown is noted. His skin is also assessed and mild hyperpigmentation is noted in the radiation field but no evidence of  desquamation or breakdown is identified.  ASSESSMENT: Adenocarcinoma of the stomach status post partial gastrectomy, receiving adjuvant radiotherapy, with radiation induced nausea.  PLAN: We will continue with the patient's radiation treatment as planned. We discussed scheduling his Zofran each morning with breakfast, and we would continue to follow this closely. He will contact us if he feels that he needs additional prn strategies for nausea relief.  Jodelle Gross, MD

## 2015-11-02 NOTE — Progress Notes (Addendum)
Weekly rad txs abdomen 5/25 completed, trook zofran before coming for radiation, had Esophagoscopy + ballon dilatation yesterday 11/01/15  Dr. Carlean Purl, this is his 9th  Time of this , he will call Dr. Okey Dupre at [facility]  For the next dilatation, peg tube, feedings 10 hours at night at 42ml/hr jevity 1.5, flushes with free water  120 mls before and after feedings completed, has mucositis on bottom lip , takes Xeloda bid, gets fatigued after radiation  10:24 AM BP 108/69 mmHg  Pulse 67  Temp(Src) 97.7 F (36.5 C) (Oral)  Resp 20  Wt 173 lb 9.6 oz (78.744 kg)  Wt Readings from Last 3 Encounters:  11/02/15 173 lb 9.6 oz (78.744 kg)  11/01/15 169 lb (76.658 kg)  10/16/15 173 lb 6 oz (78.642 kg)

## 2015-11-02 NOTE — Telephone Encounter (Signed)
  Follow up Call-  Call back number 11/01/2015 08/29/2015 08/22/2015 06/05/2015  Post procedure Call Back phone  # [phone]  Permission to leave phone message Yes Yes Yes Yes     Patient questions:  Do you have a fever, pain , or abdominal swelling? No. Pain Score  0 *  Have you tolerated food without any problems? Yes.    Have you been able to return to your normal activities? Yes.    Do you have any questions about your discharge instructions: Diet   No. Medications  No. Follow up visit  No.  Do you have questions or concerns about your Care? No.  Actions: * If pain score is 4 or above: No action needed, pain <4.

## 2015-11-04 NOTE — Addendum Note (Signed)
Encounter addended by: Kyung Rudd, MD on: 11/04/2015 12:08 PM<BR>     Documentation filed: Notes Section, Visit Diagnoses

## 2015-11-04 NOTE — Progress Notes (Signed)
  Radiation Oncology         [phone] ________________________________  Name: James [name] MRN: [id number]  Date: 10/19/2015  DOB: December 27, 1945  SIMULATION AND TREATMENT PLANNING NOTE  DIAGNOSIS:     ICD-9-CM ICD-10-CM   1. 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  Identity was confirmed.  All relevant records and images related to the planned course of therapy were reviewed.   Written consent to proceed with treatment was confirmed which was freely given after reviewing the details related to the planned course of therapy had been reviewed with the patient.  Then, the patient was set-up in a stable reproducible  supine position for radiation therapy.  CT images were obtained.  Surface markings were placed.    Medically necessary complex treatment device(s) for immobilization:  Customized vac lock bag.   The CT images were loaded into the planning software.  Then the target and avoidance structures were contoured.  Treatment planning then occurred.  The radiation prescription was entered and confirmed.  A total of 3 complex treatment devices were fabricated which relate to the designed radiation treatment fields. Each of these customized fields/ complex treatment devices will be used on a daily basis during the radiation course. I have requested : Intensity Modulated Radiotherapy (IMRT) is medically necessary for this case for the following reason:  Sparing of the spinal cord, kidneys and liver.   PLAN:  The patient will receive 45 Gy in 25 fractions initially. The patient then will receive a 5.4 gray boost to yield a final total dose of 50.4 gray.  The patient will undergo daily image guidance to ensure accurate localization of the target, and adequate minimize dose to the normal surrounding structures in close proximity to the target.   Special treatment procedure The  patient will also receive concurrent chemotherapy during the treatment. The patient may therefore experience increased toxicity or side effects and the patient will be monitored for such problems. This may require extra lab work as necessary. This therefore constitutes a special treatment procedure.   ________________________________   Jodelle Gross, MD, PhD

## 2015-11-05 ENCOUNTER — Ambulatory Visit
Admission: RE | Admit: 2015-11-05 | Discharge: 2015-11-05 | Disposition: A | Discharge status: Home / Self Care | Payer: Medicare Other | Source: Ambulatory Visit | Attending: Radiation Oncology | Admitting: Radiation Oncology

## 2015-11-05 ENCOUNTER — Ambulatory Visit: Payer: Medicare Other

## 2015-11-05 DIAGNOSIS — C165 Malignant neoplasm of lesser curvature of stomach, unspecified: Secondary | ICD-10-CM | POA: Diagnosis not present

## 2015-11-06 ENCOUNTER — Ambulatory Visit: Payer: Medicare Other

## 2015-11-06 ENCOUNTER — Ambulatory Visit
Admission: RE | Admit: 2015-11-06 | Discharge: 2015-11-06 | Disposition: A | Discharge status: Home / Self Care | Payer: Medicare Other | Source: Ambulatory Visit | Attending: Radiation Oncology | Admitting: Radiation Oncology

## 2015-11-06 DIAGNOSIS — C165 Malignant neoplasm of lesser curvature of stomach, unspecified: Secondary | ICD-10-CM | POA: Diagnosis not present

## 2015-11-07 ENCOUNTER — Ambulatory Visit
Admission: RE | Admit: 2015-11-07 | Discharge: 2015-11-07 | Disposition: A | Discharge status: Home / Self Care | Payer: Medicare Other | Source: Ambulatory Visit | Attending: Radiation Oncology | Admitting: Radiation Oncology

## 2015-11-07 ENCOUNTER — Ambulatory Visit: Payer: Medicare Other

## 2015-11-07 DIAGNOSIS — C165 Malignant neoplasm of lesser curvature of stomach, unspecified: Secondary | ICD-10-CM | POA: Diagnosis not present

## 2015-11-08 ENCOUNTER — Ambulatory Visit
Admission: RE | Admit: 2015-11-08 | Discharge: 2015-11-08 | Disposition: A | Discharge status: Home / Self Care | Payer: Medicare Other | Source: Ambulatory Visit | Attending: Radiation Oncology | Admitting: Radiation Oncology

## 2015-11-08 ENCOUNTER — Ambulatory Visit: Payer: Medicare Other

## 2015-11-08 DIAGNOSIS — C165 Malignant neoplasm of lesser curvature of stomach, unspecified: Secondary | ICD-10-CM | POA: Diagnosis not present

## 2015-11-09 ENCOUNTER — Encounter: Payer: Self-pay | Admitting: Radiation Oncology

## 2015-11-09 ENCOUNTER — Ambulatory Visit: Payer: Medicare Other

## 2015-11-09 ENCOUNTER — Ambulatory Visit
Admission: RE | Admit: 2015-11-09 | Discharge: 2015-11-09 | Disposition: A | Discharge status: Home / Self Care | Payer: Medicare Other | Source: Ambulatory Visit | Attending: Radiation Oncology | Admitting: Radiation Oncology

## 2015-11-09 VITALS — BP 124/78 | HR 64 | Temp 97.9°F | Ht 67.0 in | Wt 171.9 lb

## 2015-11-09 DIAGNOSIS — C165 Malignant neoplasm of lesser curvature of stomach, unspecified: Secondary | ICD-10-CM | POA: Diagnosis not present

## 2015-11-09 NOTE — Progress Notes (Signed)
James [name] has completed 10 fractions to his abdomen.  He denies having pain today but reports having pain in his left side area off and on for the past 2 weeks.  He reports having more nausea.  He is taking zofran.  He is also taking Xeloda.  He is eating softer foods and putting 3 cans of Jevity through his feeding tube at night.  He reports having a good energy level.  BP 124/78 mmHg  Pulse 64  Temp(Src) 97.9 F (36.6 C) (Oral)  Ht 5\' 7"  (1.702 m)  Wt 171 lb 14.4 oz (77.973 kg)  BMI 26.92 kg/m2

## 2015-11-09 NOTE — Progress Notes (Addendum)
Department of Radiation Oncology  Phone:  [phone] Fax:        [phone]  Weekly Treatment Note    Name: James [name] Date: 11/09/2015 MRN: [id number] DOB: 10/18/1945   Diagnosis:     ICD-9-CM ICD-10-CM   1. 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 capecitabine (XELODA) 500 MG tablet Take 3 tablets (1,500 mg total) by mouth 2 (two) times daily after a meal. Take on days of Radiation Only 120 tablet 0  . ferrous sulfate 325 (65 FE) MG EC tablet Take 1 tablet (325 mg total) by mouth daily. 30 tablet 3  . hyaluronate sodium (RADIAPLEXRX) GEL Apply 1 application topically daily. Reported on 11/02/2015    . Nutritional Supplements (FEEDING SUPPLEMENT, JEVITY 1.5 CAL,) LIQD Place 1,000 mLs into feeding tube continuous. Put into J tube via pump at 40cc/h (Patient taking differently: Place 1,000 mLs into feeding tube continuous. Put into J tube via pump at 65cc/h- 12 hours a day) 5000 mL 5  . omeprazole (PRILOSEC) 40 MG capsule Take 1 capsule (40 mg total) by mouth 2 (two) times daily before a meal. 30 MINS BEFORE BREAKFAST/SUPPER 60 capsule 2  . ondansetron (ZOFRAN) 8 MG tablet Take 8 mg by mouth every 8 (eight) hours as needed for nausea or vomiting.    . pravastatin (PRAVACHOL) 40 MG tablet Take 1 tablet (40 mg total) by mouth daily. HOLD UNTIL SWALLOWING BETTER 90 tablet 3  . Water For Irrigation, Sterile (FREE WATER) SOLN Place 240 mLs into feeding tube 4 (four) times daily. (Patient taking differently: Place 240 mLs into feeding tube 2 (two) times daily. ) 1000 mL 5  . prochlorperazine (COMPAZINE) 10 MG tablet Take 10 mg by mouth every 6 (six) hours as needed for nausea or vomiting. Reported on 11/09/2015     No current facility-administered medications for this encounter.     ALLERGIES: Codeine   LABORATORY DATA:  Lab  Results  Component Value Date   WBC 6.4 10/31/2015   HGB 13.8 10/31/2015   HCT 41.7 10/31/2015   MCV 85.0 10/31/2015   PLT 195.0 10/31/2015   Lab Results  Component Value Date   NA 140 09/07/2015   K 4.0 09/07/2015   CL 103 09/07/2015   CO2 29 09/07/2015   Lab Results  Component Value Date   ALT <9 08/23/2015   AST 16 08/23/2015   ALKPHOS 78 08/23/2015   BILITOT 0.31 08/23/2015     NARRATIVE: [name] was seen today for weekly treatment management. The chart was checked and the patient's films were reviewed.  [name] has completed 10 fractions to his abdomen.  He denies having pain today but reports having pain in his left side area off and on for the past 2 weeks.  He reports having more nausea.  He is taking zofran.  He is also taking Xeloda.  He is eating softer foods and putting 3 cans of Jevity through his feeding tube at night.  He reports having a good energy level.  BP 124/78 mmHg  Pulse 64  Temp(Src) 97.9 F (36.6 C) (Oral)  Ht 5\' 7"  (1.702 m)  Wt 171 lb 14.4 oz (77.973 kg)  BMI 26.92 kg/m2  PHYSICAL EXAMINATION: height is 5\' 7"  (1.702 m) and weight is 171 lb 14.4 oz (77.973 kg). His oral temperature  is 97.9 F (36.6 C). His blood pressure is 124/78 and his pulse is 64.        ASSESSMENT: The patient is doing satisfactorily with treatment.  PLAN: We will continue with the patient's radiation treatment as planned. The patient is having some nausea which she is controlling with Zofran. He does have Compazine available as well as a prescription but he has not picked this up yet. He is hesitant to add additional medication at this time but he knows to let us know if this worsens.

## 2015-11-12 ENCOUNTER — Telehealth: Payer: Self-pay | Admitting: Nurse Practitioner

## 2015-11-12 ENCOUNTER — Ambulatory Visit
Admission: RE | Admit: 2015-11-12 | Discharge: 2015-11-12 | Disposition: A | Discharge status: Home / Self Care | Payer: Medicare Other | Source: Ambulatory Visit | Attending: Radiation Oncology | Admitting: Radiation Oncology

## 2015-11-12 ENCOUNTER — Other Ambulatory Visit (HOSPITAL_BASED_OUTPATIENT_CLINIC_OR_DEPARTMENT_OTHER): Payer: Medicare Other

## 2015-11-12 ENCOUNTER — Ambulatory Visit (HOSPITAL_BASED_OUTPATIENT_CLINIC_OR_DEPARTMENT_OTHER): Payer: Medicare Other | Admitting: Oncology

## 2015-11-12 ENCOUNTER — Ambulatory Visit: Payer: Medicare Other

## 2015-11-12 VITALS — BP 109/71 | HR 76 | Temp 97.7°F | Resp 18 | Ht 67.0 in | Wt 170.2 lb

## 2015-11-12 DIAGNOSIS — D701 Agranulocytosis secondary to cancer chemotherapy: Secondary | ICD-10-CM

## 2015-11-12 DIAGNOSIS — C165 Malignant neoplasm of lesser curvature of stomach, unspecified: Secondary | ICD-10-CM

## 2015-11-12 DIAGNOSIS — C61 Malignant neoplasm of prostate: Secondary | ICD-10-CM | POA: Diagnosis not present

## 2015-11-12 DIAGNOSIS — C169 Malignant neoplasm of stomach, unspecified: Secondary | ICD-10-CM

## 2015-11-12 LAB — CBC WITH DIFFERENTIAL/PLATELET
BASO%: 0.3 % (ref 0.0–2.0)
Basophils Absolute: 0 10*3/uL (ref 0.0–0.1)
EOS%: 8.5 % — ABNORMAL HIGH (ref 0.0–7.0)
Eosinophils Absolute: 0.3 10*3/uL (ref 0.0–0.5)
HEMATOCRIT: 42.2 % (ref 38.4–49.9)
HGB: 13.8 g/dL (ref 13.0–17.1)
LYMPH#: 0.5 10*3/uL — AB (ref 0.9–3.3)
LYMPH%: 13 % — ABNORMAL LOW (ref 14.0–49.0)
MCH: 28.7 pg (ref 27.2–33.4)
MCHC: 32.6 g/dL (ref 32.0–36.0)
MCV: 88.1 fL (ref 79.3–98.0)
MONO#: 0.4 10*3/uL (ref 0.1–0.9)
MONO%: 10.6 % (ref 0.0–14.0)
NEUT%: 67.6 % (ref 39.0–75.0)
NEUTROS ABS: 2.5 10*3/uL (ref 1.5–6.5)
PLATELETS: 125 10*3/uL — AB (ref 140–400)
RBC: 4.79 10*6/uL (ref 4.20–5.82)
RDW: 19.8 % — ABNORMAL HIGH (ref 11.0–14.6)
WBC: 3.7 10*3/uL — AB (ref 4.0–10.3)

## 2015-11-12 LAB — COMPREHENSIVE METABOLIC PANEL
ALBUMIN: 3.7 g/dL (ref 3.5–5.0)
ALT: 10 U/L (ref 0–55)
ANION GAP: 9 meq/L (ref 3–11)
AST: 16 U/L (ref 5–34)
Alkaline Phosphatase: 67 U/L (ref 40–150)
BUN: 12.8 mg/dL (ref 7.0–26.0)
CALCIUM: 9.1 mg/dL (ref 8.4–10.4)
CO2: 26 mEq/L (ref 22–29)
Chloride: 105 mEq/L (ref 98–109)
Creatinine: 0.8 mg/dL (ref 0.7–1.3)
EGFR: >90 mL/min/{1.73_m2} (ref >90)
Glucose: 126 mg/dl (ref 70–140)
Potassium: 4.4 mEq/L (ref 3.5–5.1)
Sodium: 140 mEq/L (ref 136–145)
TOTAL PROTEIN: 6.9 g/dL (ref 6.4–8.3)
Total Bilirubin: 0.35 mg/dL (ref 0.20–1.20)

## 2015-11-12 NOTE — Telephone Encounter (Signed)
S/w pt confirming labs/ov added per 02/06 POF...  KJ

## 2015-11-12 NOTE — Progress Notes (Signed)
  [facility] OFFICE PROGRESS NOTE   Diagnosis:  Gastric cancer  INTERVAL HISTORY:    James [name] returns as scheduled. He started radiation and Xeloda on 10/29/2015. He reports nausea since starting radiation. The nausea is relieved with Zofran. He is tolerating a partial diet and continues tube feedings. He has difficulty swallowing the Xeloda pills, but has been able to take the pills as prescribed. No diarrhea or hand/foot pain. He had a mouth ulcer that has resolved.  Objective:  Vital signs in last 24 hours:  Blood pressure 109/71, pulse 76, temperature 97.7 F (36.5 C), temperature source Oral, resp. rate 18, height 5\' 7"  (1.702 m), weight 170 lb 3.2 oz (77.202 kg), SpO2 99 %.    HEENT:  No thrush or ulcers Resp:  Lungs clear bilaterally Cardio:  Regular rate and rhythm GI:  No hepatomegaly,  Tender near the feeding tube site, no evidence of infection Vascular:  No leg edema  Skin: Palms without erythema     Lab Results:  Lab Results  Component Value Date   WBC 3.7* 11/12/2015   HGB 13.8 11/12/2015   HCT 42.2 11/12/2015   MCV 88.1 11/12/2015   PLT 125* 11/12/2015   NEUTROABS 2.5 11/12/2015     Medications: I have reviewed the patient's current medications.  Assessment/Plan: 1. Gastric cancer-adenocarcinoma of the gastric fundus/cardia  Staging CT scans 06/07/2015 with no evidence of distant metastatic disease, tiny nonspecific pulmonary nodules and liver lesions-likely benign  Subtotal gastrectomy and placement of a jejunostomy feeding tube 07/18/2015 for a T2,N0 tumor with negative surgical margins  Initiation of adjuvant 5-FU/leucovorin 08/28/2015, status post 3 weekly treatments completed 09/13/2015   initiation of radiation and concurrent Xeloda 10/29/2015   2.  History ofMicrocytic anemia-likely iron deficiency anemia secondary to #1  3. Possible Vitamin B-12 deficiency, based on high In him a level -status post vitamin B-12  replacement, plan to repeat a vitamin B 12 level in 3-6 months  4. 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 Anastomotic stricture-status post balloon dilatation 08/22/2015, status post electroincision dilation at [facility] 10/11/2015 , repeat EGD/ dilatation by Dr. Carlean Purl 11/01/2015  7. Mild neutropenia/thrombocytopenia secondary to chemotherapy  8.Erythema/tenderness at the jejunostomy feeding tube site 09/20/2015.     Disposition:   James [name] appears to be tolerating the Xeloda and radiation well. He will continue Zofran prior to radiation and then as needed. He will let me know if he is unable to swallow the Xeloda pills and we will switch to infusional 5-FU. James [name] will return for a lab visit in one week and an office visit in 2 weeks.   The plan is to administer additional 5-FU/leucovorin at the completion of radiation.    11/12/2015  11:21 AM

## 2015-11-13 ENCOUNTER — Ambulatory Visit
Admission: RE | Admit: 2015-11-13 | Discharge: 2015-11-13 | Disposition: A | Discharge status: Home / Self Care | Payer: Medicare Other | Source: Ambulatory Visit | Attending: Radiation Oncology | Admitting: Radiation Oncology

## 2015-11-13 ENCOUNTER — Ambulatory Visit: Payer: Medicare Other

## 2015-11-13 ENCOUNTER — Encounter: Payer: Self-pay | Admitting: Oncology

## 2015-11-13 DIAGNOSIS — C165 Malignant neoplasm of lesser curvature of stomach, unspecified: Secondary | ICD-10-CM | POA: Diagnosis not present

## 2015-11-13 NOTE — Progress Notes (Signed)
Per [employer] capecitabine was shipped via fedex 11/12/15

## 2015-11-14 ENCOUNTER — Ambulatory Visit: Payer: Medicare Other

## 2015-11-14 ENCOUNTER — Ambulatory Visit
Admission: RE | Admit: 2015-11-14 | Discharge: 2015-11-14 | Disposition: A | Discharge status: Home / Self Care | Payer: Medicare Other | Source: Ambulatory Visit | Attending: Radiation Oncology | Admitting: Radiation Oncology

## 2015-11-14 DIAGNOSIS — C165 Malignant neoplasm of lesser curvature of stomach, unspecified: Secondary | ICD-10-CM | POA: Diagnosis not present

## 2015-11-15 ENCOUNTER — Ambulatory Visit
Admission: RE | Admit: 2015-11-15 | Discharge: 2015-11-15 | Disposition: A | Discharge status: Home / Self Care | Payer: Medicare Other | Source: Ambulatory Visit | Attending: Radiation Oncology | Admitting: Radiation Oncology

## 2015-11-15 ENCOUNTER — Ambulatory Visit: Payer: Medicare Other

## 2015-11-15 DIAGNOSIS — C165 Malignant neoplasm of lesser curvature of stomach, unspecified: Secondary | ICD-10-CM | POA: Diagnosis not present

## 2015-11-16 ENCOUNTER — Ambulatory Visit: Payer: Medicare Other

## 2015-11-16 ENCOUNTER — Encounter: Payer: Self-pay | Admitting: Radiation Oncology

## 2015-11-16 ENCOUNTER — Ambulatory Visit
Admission: RE | Admit: 2015-11-16 | Discharge: 2015-11-16 | Disposition: A | Discharge status: Home / Self Care | Payer: Medicare Other | Source: Ambulatory Visit | Attending: Radiation Oncology | Admitting: Radiation Oncology

## 2015-11-16 ENCOUNTER — Other Ambulatory Visit: Payer: Self-pay | Admitting: Hematology

## 2015-11-16 VITALS — BP 123/90 | HR 88 | Temp 98.3°F | Resp 20 | Wt 167.9 lb

## 2015-11-16 DIAGNOSIS — C165 Malignant neoplasm of lesser curvature of stomach, unspecified: Secondary | ICD-10-CM | POA: Diagnosis not present

## 2015-11-16 MED ORDER — SUCRALFATE 1 G PO TABS: 1.0000 g | ORAL_TABLET | Freq: Four times a day (QID) | ORAL | Status: DC

## 2015-11-16 NOTE — Progress Notes (Signed)
Weekly rad txs abdomen, 15/25 completed, nauase, difficulty swallowing food or fluids, takes jevity 1.5 can bid with free water before and after via peg tube, feels blah, and his tongue is coated white, no taste in foods either,fatigued 2:59 PM  BP 123/90 mmHg  Pulse 88  Temp(Src) 98.3 F (36.8 C) (Oral)  Resp 20  Wt 167 lb 14.4 oz (76.159 kg)  Wt Readings from Last 3 Encounters:  11/16/15 167 lb 14.4 oz (76.159 kg)  11/12/15 170 lb 3.2 oz (77.202 kg)  11/09/15 171 lb 14.4 oz (77.973 kg)

## 2015-11-16 NOTE — Progress Notes (Signed)
Department of Radiation Oncology  Phone:  [phone] Fax:        [phone]  Weekly Treatment Note    Name: James [name] Date: 11/16/2015 MRN: [id number] DOB: 1946/08/07   Diagnosis:     ICD-9-CM ICD-10-CM   1. 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 capecitabine (XELODA) 500 MG tablet Take 3 tablets (1,500 mg total) by mouth 2 (two) times daily after a meal. Take on days of Radiation Only 120 tablet 0  . ferrous sulfate 325 (65 FE) MG EC tablet Take 1 tablet (325 mg total) by mouth daily. 30 tablet 3  . hyaluronate sodium (RADIAPLEXRX) GEL Apply 1 application topically daily. Reported on 11/02/2015    . omeprazole (PRILOSEC) 40 MG capsule Take 1 capsule (40 mg total) by mouth 2 (two) times daily before a meal. 30 MINS BEFORE BREAKFAST/SUPPER 60 capsule 2  . ondansetron (ZOFRAN) 8 MG tablet Take 8 mg by mouth every 8 (eight) hours as needed for nausea or vomiting.    . pravastatin (PRAVACHOL) 40 MG tablet Take 1 tablet (40 mg total) by mouth daily. HOLD UNTIL SWALLOWING BETTER 90 tablet 3  . Water For Irrigation, Sterile (FREE WATER) SOLN Place 240 mLs into feeding tube 4 (four) times daily. (Patient taking differently: Place 240 mLs into feeding tube 2 (two) times daily. ) 1000 mL 5  . Nutritional Supplements (FEEDING SUPPLEMENT, JEVITY 1.5 CAL,) LIQD Place 1,000 mLs into feeding tube continuous. Put into J tube via pump at 40cc/h (Patient taking differently: Place 1,000 mLs into feeding tube continuous. Put into J tube via pump at 65cc/h- 12 hours a day) 5000 mL 5  . prochlorperazine (COMPAZINE) 10 MG tablet Take 10 mg by mouth every 6 (six) hours as needed for nausea or vomiting. Reported on 11/16/2015    . sucralfate (CARAFATE) 1 g tablet Take 1 tablet (1 g total) by mouth 4 (four) times daily. 120 tablet 2   No current  facility-administered medications for this encounter.     ALLERGIES: Codeine   LABORATORY DATA:  Lab Results  Component Value Date   WBC 3.7* 11/12/2015   HGB 13.8 11/12/2015   HCT 42.2 11/12/2015   MCV 88.1 11/12/2015   PLT 125* 11/12/2015   Lab Results  Component Value Date   NA 140 11/12/2015   K 4.4 11/12/2015   CL 103 09/07/2015   CO2 26 11/12/2015   Lab Results  Component Value Date   ALT 10 11/12/2015   AST 16 11/12/2015   ALKPHOS 67 11/12/2015   BILITOT 0.35 11/12/2015     NARRATIVE: James [name] was seen today for weekly treatment management. The chart was checked and the patient's films were reviewed.  Weekly rad txs abdomen, 15/25 completed, nauase, difficulty swallowing food or fluids, takes jevity 1.5 can bid with free water before and after via peg tube, feels blah, and his tongue is coated white, no taste in foods either,fatigued 6:32 PM  BP 123/90 mmHg  Pulse 88  Temp(Src) 98.3 F (36.8 C) (Oral)  Resp 20  Wt 167 lb 14.4 oz (76.159 kg)  Wt Readings from Last 3 Encounters:  11/16/15 167 lb 14.4 oz (76.159 kg)  11/12/15 170 lb 3.2 oz (77.202 kg)  11/09/15 171 lb 14.4 oz (77.973 kg)     PHYSICAL EXAMINATION: weight is 167  lb 14.4 oz (76.159 kg). His oral temperature is 98.3 F (36.8 C). His blood pressure is 123/90 and his pulse is 88. His respiration is 20.        ASSESSMENT: The patient is doing satisfactorily with treatment.  PLAN: We will continue with the patient's radiation treatment as planned. The patient is still battling some nausea and is primarily using Zofran. He does have Compazine available if this worsens. Overall doing reasonably well in terms of maintaining his weight and nutritional status.

## 2015-11-19 ENCOUNTER — Ambulatory Visit: Payer: Medicare Other

## 2015-11-19 ENCOUNTER — Ambulatory Visit
Admission: RE | Admit: 2015-11-19 | Discharge: 2015-11-19 | Disposition: A | Discharge status: Home / Self Care | Payer: Medicare Other | Source: Ambulatory Visit | Attending: Radiation Oncology | Admitting: Radiation Oncology

## 2015-11-19 ENCOUNTER — Telehealth: Payer: Self-pay | Admitting: *Deleted

## 2015-11-19 DIAGNOSIS — K222 Esophageal obstruction: Secondary | ICD-10-CM | POA: Diagnosis not present

## 2015-11-19 DIAGNOSIS — K219 Gastro-esophageal reflux disease without esophagitis: Secondary | ICD-10-CM | POA: Diagnosis not present

## 2015-11-19 DIAGNOSIS — Z9889 Other specified postprocedural states: Secondary | ICD-10-CM | POA: Diagnosis not present

## 2015-11-19 DIAGNOSIS — T18128A Food in esophagus causing other injury, initial encounter: Secondary | ICD-10-CM | POA: Diagnosis not present

## 2015-11-19 DIAGNOSIS — R1310 Dysphagia, unspecified: Secondary | ICD-10-CM | POA: Diagnosis not present

## 2015-11-19 DIAGNOSIS — Z980 Intestinal bypass and anastomosis status: Secondary | ICD-10-CM | POA: Diagnosis not present

## 2015-11-19 NOTE — Telephone Encounter (Signed)
Called patient home,  To check on how he was doing, he cancelled todays radiation treatment had prior appt elsewhere., left voice mail  To see if he did get his carafate rx, please call if didn't

## 2015-11-20 ENCOUNTER — Ambulatory Visit
Admission: RE | Admit: 2015-11-20 | Discharge: 2015-11-20 | Disposition: A | Discharge status: Home / Self Care | Payer: Medicare Other | Source: Ambulatory Visit | Attending: Radiation Oncology | Admitting: Radiation Oncology

## 2015-11-20 ENCOUNTER — Ambulatory Visit: Payer: Medicare Other

## 2015-11-20 DIAGNOSIS — C165 Malignant neoplasm of lesser curvature of stomach, unspecified: Secondary | ICD-10-CM | POA: Diagnosis not present

## 2015-11-21 ENCOUNTER — Ambulatory Visit: Payer: Medicare Other

## 2015-11-21 ENCOUNTER — Ambulatory Visit
Admission: RE | Admit: 2015-11-21 | Discharge: 2015-11-21 | Disposition: A | Discharge status: Home / Self Care | Payer: Medicare Other | Source: Ambulatory Visit | Attending: Radiation Oncology | Admitting: Radiation Oncology

## 2015-11-21 DIAGNOSIS — C165 Malignant neoplasm of lesser curvature of stomach, unspecified: Secondary | ICD-10-CM | POA: Diagnosis not present

## 2015-11-22 ENCOUNTER — Ambulatory Visit
Admission: RE | Admit: 2015-11-22 | Discharge: 2015-11-22 | Disposition: A | Discharge status: Home / Self Care | Payer: Medicare Other | Source: Ambulatory Visit | Attending: Radiation Oncology | Admitting: Radiation Oncology

## 2015-11-22 ENCOUNTER — Ambulatory Visit: Payer: Medicare Other

## 2015-11-22 DIAGNOSIS — C165 Malignant neoplasm of lesser curvature of stomach, unspecified: Secondary | ICD-10-CM | POA: Diagnosis not present

## 2015-11-22 NOTE — Progress Notes (Signed)
Patient came to nursing after radiatio treatment, took vitals, grimacing stating pain 10/10 abdomen where peg tube is  Mores so when standing, , has been this way for a couple days, this am worse, checked where peg tube was, at the stitching is reddened and bulbous,  ,cleaned area with normal saline , patien has taken tylenol this am , asked how he does his feedings at n[facility], pillow elevates his head while getting the feedings, stated, listened to his bowel sounds , hypoactive, Dr. Marlowe Aschoff office called and will have patient come to there office to gave their PA check patient out, it looks as if he mat need to start antibiotics, printed avs with vitals gave to wife 2:24 PM

## 2015-11-23 ENCOUNTER — Other Ambulatory Visit: Payer: Self-pay | Admitting: *Deleted

## 2015-11-23 ENCOUNTER — Ambulatory Visit: Payer: Medicare Other

## 2015-11-23 ENCOUNTER — Ambulatory Visit
Admission: RE | Admit: 2015-11-23 | Discharge: 2015-11-23 | Disposition: A | Discharge status: Home / Self Care | Payer: Medicare Other | Source: Ambulatory Visit | Attending: Radiation Oncology | Admitting: Radiation Oncology

## 2015-11-23 ENCOUNTER — Encounter: Payer: Self-pay | Admitting: Radiation Oncology

## 2015-11-23 VITALS — BP 131/89 | HR 69 | Temp 97.8°F | Resp 20 | Wt 169.4 lb

## 2015-11-23 DIAGNOSIS — C165 Malignant neoplasm of lesser curvature of stomach, unspecified: Secondary | ICD-10-CM

## 2015-11-23 DIAGNOSIS — C169 Malignant neoplasm of stomach, unspecified: Secondary | ICD-10-CM

## 2015-11-23 NOTE — Progress Notes (Signed)
Weekly radtx abdomen19/25+completed, went to Dr. Marlowe Aschoff office 11/22/15 was placed on antibiotics for reddened peg tube incision, pain now 2/10 scale from yesterday being a 10/10,  jevity 6 cans via peg daily and free water before and after feedings  Nausea every once in awhile stated takes nausea meds, also given oxycodone 5mg  /16ml to go via peg tube daily prn for pain, he states it keeps himn up at night, feels a lot better today 3:07 PM BP 131/89 mmHg  Pulse 69  Temp(Src) 97.8 F (36.6 C) (Oral)  Resp 20  Wt 169 lb 6.4 oz (76.839 kg)  Wt Readings from Last 3 Encounters:  11/23/15 169 lb 6.4 oz (76.839 kg)  11/16/15 167 lb 14.4 oz (76.159 kg)  11/12/15 170 lb 3.2 oz (77.202 kg)

## 2015-11-25 NOTE — Progress Notes (Signed)
Department of Radiation Oncology  Phone:  [phone] Fax:        [phone]  Weekly Treatment Note    Name: James [name] Date: 11/25/2015 MRN: [id number] DOB: [birth year]   Diagnosis:     ICD-9-CM ICD-10-CM   1. 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 capecitabine (XELODA) 500 MG tablet Take 3 tablets (1,500 mg total) by mouth 2 (two) times daily after a meal. Take on days of Radiation Only 120 tablet 0  . ferrous sulfate 325 (65 FE) MG EC tablet Take 1 tablet (325 mg total) by mouth daily. 30 tablet 3  . hyaluronate sodium (RADIAPLEXRX) GEL Apply 1 application topically daily. Reported on 11/02/2015    . Nutritional Supplements (FEEDING SUPPLEMENT, JEVITY 1.5 CAL,) LIQD Place 1,000 mLs into feeding tube continuous. Put into J tube via pump at 40cc/h (Patient taking differently: Place 1,000 mLs into feeding tube continuous. Put into J tube via pump at 65cc/h- 12 hours a day) 5000 mL 5  . omeprazole (PRILOSEC) 40 MG capsule Take 1 capsule (40 mg total) by mouth 2 (two) times daily before a meal. 30 MINS BEFORE BREAKFAST/SUPPER 60 capsule 2  . ondansetron (ZOFRAN) 8 MG tablet Take 8 mg by mouth every 8 (eight) hours as needed for nausea or vomiting.    [name] oxyCODONE (ROXICODONE) 5 MG/5ML solution Place 5 mLs into feeding tube daily as needed.  0  . pravastatin (PRAVACHOL) 40 MG tablet Take 1 tablet (40 mg total) by mouth daily. HOLD UNTIL SWALLOWING BETTER 90 tablet 3  . prochlorperazine (COMPAZINE) 10 MG tablet Take 10 mg by mouth every 6 (six) hours as needed for nausea or vomiting. Reported on 11/16/2015    . sucralfate (CARAFATE) 1 g tablet Take 1 tablet (1 g total) by mouth 4 (four) times daily. 120 tablet 2  . sulfamethoxazole-trimethoprim (BACTRIM,SEPTRA) 200-40 MG/5ML suspension Place 5 mLs into feeding tube 2 (two) times  daily.  0  . Water For Irrigation, Sterile (FREE WATER) SOLN Place 240 mLs into feeding tube 4 (four) times daily. (Patient taking differently: Place 240 mLs into feeding tube 2 (two) times daily. ) 1000 mL 5   No current facility-administered medications for this encounter.     ALLERGIES: Codeine   LABORATORY DATA:  Lab Results  Component Value Date   WBC 3.7* 11/12/2015   HGB 13.8 11/12/2015   HCT 42.2 11/12/2015   MCV 88.1 11/12/2015   PLT 125* 11/12/2015   Lab Results  Component Value Date   NA 140 11/12/2015   K 4.4 11/12/2015   CL 103 09/07/2015   CO2 26 11/12/2015   Lab Results  Component Value Date   ALT 10 11/12/2015   AST 16 11/12/2015   ALKPHOS 67 11/12/2015   BILITOT 0.35 11/12/2015     NARRATIVE: James [name] was seen today for weekly treatment management. The chart was checked and the patient's films were reviewed.  Weekly radtx abdomen19/25+completed, went to Dr. Marlowe [name] office 11/22/15 was placed on antibiotics for reddened peg tube incision, pain now 2/10 scale from yesterday being a 10/10,  jevity 6 cans via peg daily and free water before and after feedings  Nausea every once in awhile stated takes nausea meds, also given oxycodone 5mg  /38ml to go via peg tube daily prn for pain, he states it keeps  himn up at night, feels a lot better today 10:08 PM BP 131/89 mmHg  Pulse 69  Temp(Src) 97.8 F (36.6 C) (Oral)  Resp 20  Wt 169 lb 6.4 oz (76.839 kg)  Wt Readings from Last 3 Encounters:  11/23/15 169 lb 6.4 oz (76.839 kg)  11/16/15 167 lb 14.4 oz (76.159 kg)  11/12/15 170 lb 3.2 oz (77.202 kg)    PHYSICAL EXAMINATION: weight is 169 lb 6.4 oz (76.839 kg). His oral temperature is 97.8 F (36.6 C). His blood pressure is 131/89 and his pulse is 69. His respiration is 20.        ASSESSMENT: The patient is doing satisfactorily with treatment.  PLAN: We will continue with the patient's radiation treatment as planned.

## 2015-11-26 ENCOUNTER — Telehealth: Payer: Self-pay

## 2015-11-26 ENCOUNTER — Telehealth: Payer: Self-pay | Admitting: Nurse Practitioner

## 2015-11-26 ENCOUNTER — Ambulatory Visit
Admission: RE | Admit: 2015-11-26 | Discharge: 2015-11-26 | Disposition: A | Discharge status: Home / Self Care | Payer: Medicare Other | Source: Ambulatory Visit | Attending: Radiation Oncology | Admitting: Radiation Oncology

## 2015-11-26 ENCOUNTER — Other Ambulatory Visit (HOSPITAL_BASED_OUTPATIENT_CLINIC_OR_DEPARTMENT_OTHER): Payer: Medicare Other

## 2015-11-26 ENCOUNTER — Ambulatory Visit: Payer: Medicare Other

## 2015-11-26 ENCOUNTER — Ambulatory Visit (HOSPITAL_BASED_OUTPATIENT_CLINIC_OR_DEPARTMENT_OTHER): Payer: Medicare Other | Admitting: Nurse Practitioner

## 2015-11-26 VITALS — BP 109/63 | HR 82 | Temp 98.3°F | Resp 17 | Ht 67.0 in | Wt 167.3 lb

## 2015-11-26 DIAGNOSIS — C165 Malignant neoplasm of lesser curvature of stomach, unspecified: Secondary | ICD-10-CM

## 2015-11-26 DIAGNOSIS — C61 Malignant neoplasm of prostate: Secondary | ICD-10-CM | POA: Diagnosis not present

## 2015-11-26 DIAGNOSIS — D701 Agranulocytosis secondary to cancer chemotherapy: Secondary | ICD-10-CM

## 2015-11-26 DIAGNOSIS — C161 Malignant neoplasm of fundus of stomach: Secondary | ICD-10-CM

## 2015-11-26 DIAGNOSIS — D6959 Other secondary thrombocytopenia: Secondary | ICD-10-CM

## 2015-11-26 DIAGNOSIS — C169 Malignant neoplasm of stomach, unspecified: Secondary | ICD-10-CM

## 2015-11-26 LAB — CBC WITH DIFFERENTIAL/PLATELET
BASO%: 0.3 % (ref 0.0–2.0)
BASOS ABS: 0 10*3/uL (ref 0.0–0.1)
EOS ABS: 0.5 10*3/uL (ref 0.0–0.5)
EOS%: 13.3 % — ABNORMAL HIGH (ref 0.0–7.0)
HCT: 37.2 % — ABNORMAL LOW (ref 38.4–49.9)
HGB: 12.9 g/dL — ABNORMAL LOW (ref 13.0–17.1)
LYMPH%: 10.5 % — AB (ref 14.0–49.0)
MCH: 30.6 pg (ref 27.2–33.4)
MCHC: 34.7 g/dL (ref 32.0–36.0)
MCV: 88.4 fL (ref 79.3–98.0)
MONO#: 0.5 10*3/uL (ref 0.1–0.9)
MONO%: 12 % (ref 0.0–14.0)
NEUT#: 2.5 10*3/uL (ref 1.5–6.5)
NEUT%: 63.9 % (ref 39.0–75.0)
Platelets: 66 10*3/uL — ABNORMAL LOW (ref 140–400)
RBC: 4.21 10*6/uL (ref 4.20–5.82)
RDW: 18.6 % — ABNORMAL HIGH (ref 11.0–14.6)
WBC: 3.9 10*3/uL — AB (ref 4.0–10.3)
lymph#: 0.4 10*3/uL — ABNORMAL LOW (ref 0.9–3.3)

## 2015-11-26 NOTE — Progress Notes (Addendum)
[facility] OFFICE PROGRESS NOTE   Diagnosis:  Gastric cancer  INTERVAL HISTORY:   James [name] returns as scheduled. He continues radiation/Xeloda. He has intermittent nausea. No mouth sores. Occasional loose stools. No hand or foot pain or redness. He denies any bleeding. Main complaint is pain/tenderness related to the feeding tube.  Objective:  Vital signs in last 24 hours:  Blood pressure 109/63, pulse 82, temperature 98.3 F (36.8 C), temperature source Oral, resp. rate 17, height 5\' 7"  (1.702 m), weight 167 lb 4.8 oz (75.887 kg), SpO2 97 %.    HEENT: No thrush or ulcers. Resp: Lungs clear bilaterally. Cardio: Regular rate and rhythm. GI: Abdomen is soft. No hepatomegaly. Normal appearing secretions at the feeding tube site. Marked tenderness where the feeding tube is sutured to the skin. Skin beneath the feeding tube appears superficially ulcerated. Vascular: No leg edema. Skin: Palms without erythema.    Lab Results:  Lab Results  Component Value Date   WBC 3.9* 11/26/2015   HGB 12.9* 11/26/2015   HCT 37.2* 11/26/2015   MCV 88.4 11/26/2015   PLT 66* 11/26/2015   NEUTROABS 2.5 11/26/2015    Imaging:  No results found.  Medications: I have reviewed the patient's current medications.  Assessment/Plan: 1. Gastric cancer-adenocarcinoma of the gastric fundus/cardia  Staging CT scans 06/07/2015 with no evidence of distant metastatic disease, tiny nonspecific pulmonary nodules and liver lesions-likely benign  Subtotal gastrectomy and placement of a jejunostomy feeding tube 07/18/2015 for a T2,N0 tumor with negative surgical margins  Initiation of adjuvant 5-FU/leucovorin 08/28/2015, status post 3 weekly treatments completed 09/13/2015  Initiation of radiation and concurrent Xeloda 10/29/2015  11/26/2015 Xeloda dose reduced to 1000 mg twice daily due to progressive thrombocytopenia   2. History of microcytic anemia-likely iron deficiency anemia  secondary to #1  3. Possible Vitamin B-12 deficiency, based on high methylmalonic acid level -status post vitamin B-12 replacement, plan to repeat a vitamin B 12 level in 3-6 months  4. 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 Carlean Purl 11/01/2015  7. Mild neutropenia/thrombocytopenia secondary to chemotherapy. Progressive thrombocytopenia 11/26/2015. Xeloda dose reduced to 1000 mg twice daily.  8.Erythema/tenderness at the jejunostomy feeding tube site 09/20/2015.     Disposition: James [name] will continue radiation/Xeloda. Due to the thrombocytopenia Dr. Benay Spice recommends decreasing the Xeloda dose to 1000 mg twice daily. He understands to call with any bleeding. He will return for a repeat CBC on 11/30/2015.  He is having significant pain where the feeding tube is sutured to the skin. We are contacting Dr. Marlowe Aschoff office to schedule an appointment in the acute clinic.  He is scheduled to complete the course of radiation on 12/06/2015. He understands to discontinue Xeloda coinciding with the completion of radiation.  He will return for a follow-up visit and reinitiation of weekly 5-FU/leucovorin on 12/21/2015. He will contact the office in the interim as on above or with any other problems.  Patient seen with Dr. Benay Spice.  Ned Card ANP/GNP-BC   11/26/2015  2:49 PM  This was a shared visit with Ned Card. James [name] was interviewed and examined. We adjusted the Xeloda dose. The thrombocytopenia is likely secondary to chemotherapy.  We referred him to the surgical office for evaluation of the feeding tube site.  Julieanne Manson, M.D.

## 2015-11-26 NOTE — Telephone Encounter (Signed)
As per Owens Shark ANP call placed to Dr.Byerly's office in reference to Pt. Spoke with Jearld Fenton informed her that Pt. was in a lot of pain from the sutures placed. Requesting an appointment for today. Jearld Fenton stated if the Pt. was not febrile they can't accommodate the Pt. for an appointment today,they have seen the Pt. and instructions were given to put warm compress and take antibiotics as prescribed. Informed Jearld Fenton that  around the first sutures the skin is red and looks like the skin is healing around the sutures. Appointment scheduled with Dr. Barry Dienes at 215p on Wednesday 11/28/15. Dressing cleaned and changed. Pt. verbalized understanding about instructions and the appointment for Wednesday.

## 2015-11-26 NOTE — Patient Instructions (Signed)
Decrease Xeloda to 2 tabs (1000 mg) twice a day

## 2015-11-26 NOTE — Telephone Encounter (Signed)
Wife called stating they have 18 pills of Xeloda at home and need 16 more pills for this cycle. Dr Benay Spice asked her to call us these numbers.

## 2015-11-27 ENCOUNTER — Ambulatory Visit: Payer: Medicare Other

## 2015-11-27 ENCOUNTER — Ambulatory Visit
Admission: RE | Admit: 2015-11-27 | Discharge: 2015-11-27 | Disposition: A | Discharge status: Home / Self Care | Payer: Medicare Other | Source: Ambulatory Visit | Attending: Radiation Oncology | Admitting: Radiation Oncology

## 2015-11-27 ENCOUNTER — Other Ambulatory Visit: Payer: Self-pay | Admitting: Nurse Practitioner

## 2015-11-27 DIAGNOSIS — C165 Malignant neoplasm of lesser curvature of stomach, unspecified: Secondary | ICD-10-CM | POA: Diagnosis not present

## 2015-11-27 DIAGNOSIS — C169 Malignant neoplasm of stomach, unspecified: Secondary | ICD-10-CM

## 2015-11-27 MED ORDER — CAPECITABINE 500 MG PO TABS: ORAL_TABLET | ORAL | Status: DC

## 2015-11-28 ENCOUNTER — Telehealth: Payer: Self-pay | Admitting: Oncology

## 2015-11-28 ENCOUNTER — Ambulatory Visit
Admission: RE | Admit: 2015-11-28 | Discharge: 2015-11-28 | Disposition: A | Discharge status: Home / Self Care | Payer: Medicare Other | Source: Ambulatory Visit | Attending: Radiation Oncology | Admitting: Radiation Oncology

## 2015-11-28 ENCOUNTER — Ambulatory Visit: Payer: Medicare Other

## 2015-11-28 DIAGNOSIS — Z934 Other artificial openings of gastrointestinal tract status: Secondary | ICD-10-CM | POA: Diagnosis not present

## 2015-11-28 DIAGNOSIS — C165 Malignant neoplasm of lesser curvature of stomach, unspecified: Secondary | ICD-10-CM | POA: Diagnosis not present

## 2015-11-28 NOTE — Telephone Encounter (Signed)
Left message for patient re next two appointments for 2/24 and 2/17. Patient to check my chart or get new schedule at 2/24 visit.

## 2015-11-29 ENCOUNTER — Ambulatory Visit: Payer: Medicare Other

## 2015-11-29 ENCOUNTER — Ambulatory Visit
Admission: RE | Admit: 2015-11-29 | Discharge: 2015-11-29 | Disposition: A | Discharge status: Home / Self Care | Payer: Medicare Other | Source: Ambulatory Visit | Attending: Radiation Oncology | Admitting: Radiation Oncology

## 2015-11-29 DIAGNOSIS — C165 Malignant neoplasm of lesser curvature of stomach, unspecified: Secondary | ICD-10-CM | POA: Diagnosis not present

## 2015-11-30 ENCOUNTER — Other Ambulatory Visit (HOSPITAL_BASED_OUTPATIENT_CLINIC_OR_DEPARTMENT_OTHER): Payer: Medicare Other

## 2015-11-30 ENCOUNTER — Ambulatory Visit
Admission: RE | Admit: 2015-11-30 | Discharge: 2015-11-30 | Disposition: A | Discharge status: Home / Self Care | Payer: Medicare Other | Source: Ambulatory Visit | Attending: Radiation Oncology | Admitting: Radiation Oncology

## 2015-11-30 ENCOUNTER — Ambulatory Visit: Payer: Medicare Other

## 2015-11-30 ENCOUNTER — Encounter: Payer: Self-pay | Admitting: Radiation Oncology

## 2015-11-30 VITALS — BP 111/78 | HR 82 | Temp 98.5°F | Resp 16 | Wt 165.0 lb

## 2015-11-30 DIAGNOSIS — C161 Malignant neoplasm of fundus of stomach: Secondary | ICD-10-CM | POA: Diagnosis not present

## 2015-11-30 DIAGNOSIS — C165 Malignant neoplasm of lesser curvature of stomach, unspecified: Secondary | ICD-10-CM

## 2015-11-30 LAB — CBC WITH DIFFERENTIAL/PLATELET
BASO%: 0.1 % (ref 0.0–2.0)
BASOS ABS: 0 10*3/uL (ref 0.0–0.1)
EOS ABS: 0.3 10*3/uL (ref 0.0–0.5)
EOS%: 8.2 % — AB (ref 0.0–7.0)
HCT: 40.6 % (ref 38.4–49.9)
HGB: 13.5 g/dL (ref 13.0–17.1)
LYMPH%: 4 % — AB (ref 14.0–49.0)
MCH: 29.8 pg (ref 27.2–33.4)
MCHC: 33.3 g/dL (ref 32.0–36.0)
MCV: 89.6 fL (ref 79.3–98.0)
MONO#: 0.5 10*3/uL (ref 0.1–0.9)
MONO%: 14.2 % — AB (ref 0.0–14.0)
NEUT#: 2.6 10*3/uL (ref 1.5–6.5)
NEUT%: 73.5 % (ref 39.0–75.0)
Platelets: 88 10*3/uL — ABNORMAL LOW (ref 140–400)
RBC: 4.53 10*6/uL (ref 4.20–5.82)
RDW: 20.5 % — ABNORMAL HIGH (ref 11.0–14.6)
WBC: 3.5 10*3/uL — AB (ref 4.0–10.3)
lymph#: 0.1 10*3/uL — ABNORMAL LOW (ref 0.9–3.3)

## 2015-11-30 NOTE — Progress Notes (Signed)
Department of Radiation Oncology  Phone:  [phone] Fax:        [phone]  Weekly Treatment Note    Name: James [name] Date: 11/30/2015 MRN: [id number] DOB: 12-03-1945   Diagnosis:     ICD-9-CM ICD-10-CM   1. 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 capecitabine (XELODA) 500 MG tablet Take 2 tablets my mouth twice daily after meals Monday-Friday on days of Radiation Only. 16 tablet 0  . ferrous sulfate 325 (65 FE) MG EC tablet Take 1 tablet (325 mg total) by mouth daily. 30 tablet 3  . hyaluronate sodium (RADIAPLEXRX) GEL Apply 1 application topically daily. Reported on 11/02/2015    . Nutritional Supplements (FEEDING SUPPLEMENT, JEVITY 1.5 CAL,) LIQD Place 1,000 mLs into feeding tube continuous. Put into J tube via pump at 40cc/h (Patient taking differently: Place 1,000 mLs into feeding tube continuous. Put into J tube via pump at 65cc/h- 12 hours a day) 5000 mL 5  . omeprazole (PRILOSEC) 40 MG capsule Take 1 capsule (40 mg total) by mouth 2 (two) times daily before a meal. 30 MINS BEFORE BREAKFAST/SUPPER 60 capsule 2  . ondansetron (ZOFRAN) 8 MG tablet Take 8 mg by mouth every 8 (eight) hours as needed for nausea or vomiting.    [name] oxyCODONE (ROXICODONE) 5 MG/5ML solution Place 5 mLs into feeding tube daily as needed.  0  . pravastatin (PRAVACHOL) 40 MG tablet Take 1 tablet (40 mg total) by mouth daily. HOLD UNTIL SWALLOWING BETTER 90 tablet 3  . prochlorperazine (COMPAZINE) 10 MG tablet Take 10 mg by mouth every 6 (six) hours as needed for nausea or vomiting. Reported on 11/16/2015    . sucralfate (CARAFATE) 1 g tablet Take 1 tablet (1 g total) by mouth 4 (four) times daily. 120 tablet 2  . sulfamethoxazole-trimethoprim (BACTRIM,SEPTRA) 200-40 MG/5ML suspension Place 5 mLs into feeding tube 2 (two) times daily.  0  . Water  For Irrigation, Sterile (FREE WATER) SOLN Place 240 mLs into feeding tube 4 (four) times daily. (Patient taking differently: Place 240 mLs into feeding tube 2 (two) times daily. ) 1000 mL 5   No current facility-administered medications for this encounter.     ALLERGIES: Codeine   LABORATORY DATA:  Lab Results  Component Value Date   WBC 3.5* 11/30/2015   HGB 13.5 11/30/2015   HCT 40.6 11/30/2015   MCV 89.6 11/30/2015   PLT 88* 11/30/2015   Lab Results  Component Value Date   NA 140 11/12/2015   K 4.4 11/12/2015   CL 103 09/07/2015   CO2 26 11/12/2015   Lab Results  Component Value Date   ALT 10 11/12/2015   AST 16 11/12/2015   ALKPHOS 67 11/12/2015   BILITOT 0.35 11/12/2015     NARRATIVE: James [name] was seen today for weekly treatment management. The chart was checked and the patient's films were reviewed.  Weekly rad txs abdomen, 24/28 completd, lasb today, cbc, ate eggs and grits this am  And became nasueated, took nausea med and has helped some, takes jevity 3 cans via peg tube at night, antibiotics completed, , feels blah,   2:49 PM BP 111/78 mmHg  Pulse 82  Temp(Src) 98.5 F (36.9 C) (Oral)  Resp 16  Wt 165 lb (74.844 kg)  Wt Readings from Last 3 Encounters:  11/30/15  165 lb (74.844 kg)  11/26/15 167 lb 4.8 oz (75.887 kg)  11/23/15 169 lb 6.4 oz (76.839 kg)  BP 111/78 mmHg  Pulse 82  Temp(Src) 98.5 F (36.9 C) (Oral)  Resp 16  Wt 165 lb (74.844 kg)  PHYSICAL EXAMINATION: weight is 165 lb (74.844 kg). His oral temperature is 98.5 F (36.9 C). His blood pressure is 111/78 and his pulse is 82. His respiration is 16.        ASSESSMENT: The patient is doing satisfactorily with treatment.  PLAN: We will continue with the patient's radiation treatment as planned.

## 2015-11-30 NOTE — Progress Notes (Addendum)
Weekly rad txs abdomen, 24/28 completd, lasb today, cbc, ate eggs and grits this am  And became nasueated, took nausea med and has helped some, takes jevity 3 cans via peg tube at night, antibiotics completed, , feels blah,   1:58 PM There were no vitals taken for this visit.  Wt Readings from Last 3 Encounters:  11/26/15 167 lb 4.8 oz (75.887 kg)  11/23/15 169 lb 6.4 oz (76.839 kg)  11/16/15 167 lb 14.4 oz (76.159 kg)  BP 111/78 mmHg  Pulse 82  Temp(Src) 98.5 F (36.9 C) (Oral)  Resp 16  Wt 165 lb (74.844 kg)

## 2015-11-30 NOTE — Progress Notes (Signed)
  Radiation Oncology         [phone] ________________________________  Name: James [name] MRN: [id number]  Date: 11/16/2015  DOB: Feb 11, 1946  COMPLEX SIMULATION  NOTE  Diagnosis: Gastric cancer  Narrative The patient has initially been planned to receive a course of radiation treatment to a dose of 45 gray in 25 fractions at 1.8 gray per fraction. The patient will now receive a boost to the high risk target volume for an additional 5.4 gray. This will be delivered in 3 fractions at 1.8 gray per fraction and a cone down boost technique will be utilized. To accomplish this, A IMRT boost treatment will be utilized. A complex isodose plan is requested to ensure that the high-risk target region receives the appropriate radiation dose and that the nearby normal structures continue to be appropriately spared. The patient's final total dose therefore will be 50.4 gray.   ________________________________ ------------------------------------------------  Jodelle Gross, MD, PhD

## 2015-12-03 ENCOUNTER — Ambulatory Visit: Payer: Medicare Other

## 2015-12-03 ENCOUNTER — Ambulatory Visit
Admission: RE | Admit: 2015-12-03 | Discharge: 2015-12-03 | Disposition: A | Discharge status: Home / Self Care | Payer: Medicare Other | Source: Ambulatory Visit | Attending: Radiation Oncology | Admitting: Radiation Oncology

## 2015-12-03 ENCOUNTER — Encounter: Payer: Self-pay | Admitting: Oncology

## 2015-12-03 DIAGNOSIS — C165 Malignant neoplasm of lesser curvature of stomach, unspecified: Secondary | ICD-10-CM | POA: Diagnosis not present

## 2015-12-03 NOTE — Progress Notes (Signed)
Per [facility] capecitabine shipped via fedex 11/30/15

## 2015-12-04 ENCOUNTER — Ambulatory Visit
Admission: RE | Admit: 2015-12-04 | Discharge: 2015-12-04 | Disposition: A | Discharge status: Home / Self Care | Payer: Medicare Other | Source: Ambulatory Visit | Attending: Radiation Oncology | Admitting: Radiation Oncology

## 2015-12-04 ENCOUNTER — Ambulatory Visit: Payer: Medicare Other

## 2015-12-04 DIAGNOSIS — C165 Malignant neoplasm of lesser curvature of stomach, unspecified: Secondary | ICD-10-CM | POA: Diagnosis not present

## 2015-12-05 ENCOUNTER — Ambulatory Visit: Payer: Medicare Other

## 2015-12-05 ENCOUNTER — Encounter: Payer: Self-pay | Admitting: Radiation Oncology

## 2015-12-05 ENCOUNTER — Ambulatory Visit
Admission: RE | Admit: 2015-12-05 | Discharge: 2015-12-05 | Disposition: A | Discharge status: Home / Self Care | Payer: Medicare Other | Source: Ambulatory Visit | Attending: Radiation Oncology | Admitting: Radiation Oncology

## 2015-12-05 ENCOUNTER — Telehealth: Payer: Self-pay | Admitting: Internal Medicine

## 2015-12-05 VITALS — BP 103/70 | HR 65 | Temp 98.2°F | Resp 20 | Wt 165.1 lb

## 2015-12-05 DIAGNOSIS — C165 Malignant neoplasm of lesser curvature of stomach, unspecified: Secondary | ICD-10-CM | POA: Diagnosis not present

## 2015-12-05 NOTE — Progress Notes (Signed)
Department of Radiation Oncology  Phone:  [phone] Fax:        [phone]  Weekly Treatment Note    Name: James [name] Date: 12/05/2015 MRN: [id number] DOB: Jan 06, 1946   Diagnosis:     ICD-9-CM ICD-10-CM   1. 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 diphenhydrAMINE (SOMINEX) 25 MG tablet Take 25 mg by mouth at bedtime as needed for itching or allergies.    . capecitabine (XELODA) 500 MG tablet Take 2 tablets my mouth twice daily after meals Monday-Friday on days of Radiation Only. 16 tablet 0  . ferrous sulfate 325 (65 FE) MG EC tablet Take 1 tablet (325 mg total) by mouth daily. 30 tablet 3  . hyaluronate sodium (RADIAPLEXRX) GEL Apply 1 application topically daily. Reported on 11/02/2015    . Nutritional Supplements (FEEDING SUPPLEMENT, JEVITY 1.5 CAL,) LIQD Place 1,000 mLs into feeding tube continuous. Put into J tube via pump at 40cc/h (Patient taking differently: Place 1,000 mLs into feeding tube continuous. Put into J tube via pump at 65cc/h- 12 hours a day) 5000 mL 5  . omeprazole (PRILOSEC) 40 MG capsule Take 1 capsule (40 mg total) by mouth 2 (two) times daily before a meal. 30 MINS BEFORE BREAKFAST/SUPPER 60 capsule 2  . ondansetron (ZOFRAN) 8 MG tablet Take 8 mg by mouth every 8 (eight) hours as needed for nausea or vomiting.    [name] oxyCODONE (ROXICODONE) 5 MG/5ML solution Place 5 mLs into feeding tube daily as needed.  0  . pravastatin (PRAVACHOL) 40 MG tablet Take 1 tablet (40 mg total) by mouth daily. HOLD UNTIL SWALLOWING BETTER 90 tablet 3  . prochlorperazine (COMPAZINE) 10 MG tablet Take 10 mg by mouth every 6 (six) hours as needed for nausea or vomiting. Reported on 11/16/2015    . sucralfate (CARAFATE) 1 g tablet Take 1 tablet (1 g total) by mouth 4 (four) times daily. 120 tablet 2  .  sulfamethoxazole-trimethoprim (BACTRIM,SEPTRA) 200-40 MG/5ML suspension Place 5 mLs into feeding tube 2 (two) times daily.  0  . Water For Irrigation, Sterile (FREE WATER) SOLN Place 240 mLs into feeding tube 4 (four) times daily. (Patient taking differently: Place 240 mLs into feeding tube 2 (two) times daily. ) 1000 mL 5   No current facility-administered medications for this encounter.     ALLERGIES: Codeine   LABORATORY DATA:  Lab Results  Component Value Date   WBC 3.5* 11/30/2015   HGB 13.5 11/30/2015   HCT 40.6 11/30/2015   MCV 89.6 11/30/2015   PLT 88* 11/30/2015   Lab Results  Component Value Date   NA 140 11/12/2015   K 4.4 11/12/2015   CL 103 09/07/2015   CO2 26 11/12/2015   Lab Results  Component Value Date   ALT 10 11/12/2015   AST 16 11/12/2015   ALKPHOS 67 11/12/2015   BILITOT 0.35 11/12/2015     NARRATIVE: [name] was seen today for weekly treatment management. The chart was checked and the patient's films were reviewed.  Weekly  Rad tx  Abdomen 27/28 completed, patient  Upper lip swollen, started swelling last night, took benadryl 230-3am,  No nausea, tongue is coated yellow, c/o throat soreness, no shortness breath, 3 cans jevity  Via peg, takes xeloda bid, didn't call primary MD  About the swollen mouth upper lip,  1 month f/u appt made 4:24 PM BP 103/70 mmHg  Pulse 65  Temp(Src) 98.2 F (36.8 C) (Oral)  Resp 20  Wt 165 lb 1.6 oz (74.889 kg)  SpO2 100%  Wt Readings from Last 3 Encounters:  12/05/15 165 lb 1.6 oz (74.889 kg)  11/30/15 165 lb (74.844 kg)  11/26/15 167 lb 4.8 oz (75.887 kg)    PHYSICAL EXAMINATION: weight is 165 lb 1.6 oz (74.889 kg). His oral temperature is 98.2 F (36.8 C). His blood pressure is 103/70 and his pulse is 65. His respiration is 20 and oxygen saturation is 100%.      Swelling present involving the upper lip.  ASSESSMENT: The patient is doing satisfactorily with treatment. The patient has begun Benadryl for  some swelling of the upper lip. He denies any recent medication changes. The patient is going to inform medical oncology of this change. He understands to seek immediate medical attention if he experiences any shortness of breath.  PLAN: We will continue with the patient's radiation treatment as planned. The patient will finish his treatment tomorrow. I anticipate his appetite is improving over the next couple of weeks.

## 2015-12-05 NOTE — Progress Notes (Addendum)
Weekly  Rad tx  Abdomen 27/28 completed, patient  Upper lip swollen, started swelling last night, took benadryl 230-3am,  No nausea, tongue is coated yellow, c/o throat soreness, no shortness breath, 3 cans jevity  Via peg, takes xeloda bid, didn't call primary MD  About the swollen mouth upper lip, 1 month f/u appt made 2:19 PM BP 103/70 mmHg  Pulse 65  Temp(Src) 98.2 F (36.8 C) (Oral)  Resp 20  Wt 165 lb 1.6 oz (74.889 kg)  SpO2 100%  Wt Readings from Last 3 Encounters:  12/05/15 165 lb 1.6 oz (74.889 kg)  11/30/15 165 lb (74.844 kg)  11/26/15 167 lb 4.8 oz (75.887 kg)

## 2015-12-05 NOTE — Telephone Encounter (Signed)
Dysphagia again.  Ok to schedule for EGD dil?

## 2015-12-06 ENCOUNTER — Other Ambulatory Visit: Payer: Self-pay

## 2015-12-06 ENCOUNTER — Telehealth: Payer: Self-pay | Admitting: *Deleted

## 2015-12-06 ENCOUNTER — Encounter: Payer: Self-pay | Admitting: Radiation Oncology

## 2015-12-06 ENCOUNTER — Ambulatory Visit
Admission: RE | Admit: 2015-12-06 | Discharge: 2015-12-06 | Disposition: A | Discharge status: Home / Self Care | Payer: Medicare Other | Source: Ambulatory Visit | Attending: Radiation Oncology | Admitting: Radiation Oncology

## 2015-12-06 DIAGNOSIS — C165 Malignant neoplasm of lesser curvature of stomach, unspecified: Secondary | ICD-10-CM | POA: Diagnosis not present

## 2015-12-06 DIAGNOSIS — K222 Esophageal obstruction: Secondary | ICD-10-CM

## 2015-12-06 NOTE — Telephone Encounter (Signed)
There are openings at [facility] Monday with MAC (unless they could fit him in at [facility] where I have a 1000)  Would do balloon dilation

## 2015-12-06 NOTE — Telephone Encounter (Signed)
Patient notified of EGD dil [facility] 12/10/15 .  He verbalized understanding to arrive at 9:30 and be NPO after midnight

## 2015-12-06 NOTE — Telephone Encounter (Signed)
Returned Xeloda refill request to [facility] with note attached: Therapy complete. Do not refill.

## 2015-12-07 ENCOUNTER — Encounter (HOSPITAL_COMMUNITY): Payer: Self-pay | Admitting: *Deleted

## 2015-12-10 ENCOUNTER — Ambulatory Visit (HOSPITAL_COMMUNITY): Payer: Medicare Other | Admitting: Anesthesiology

## 2015-12-10 ENCOUNTER — Ambulatory Visit (HOSPITAL_COMMUNITY)
Admission: RE | Admit: 2015-12-10 | Discharge: 2015-12-10 | Disposition: A | Discharge status: Home / Self Care | Payer: Medicare Other | Source: Ambulatory Visit | Attending: Internal Medicine | Admitting: Internal Medicine

## 2015-12-10 ENCOUNTER — Encounter (HOSPITAL_COMMUNITY): Payer: Self-pay | Admitting: *Deleted

## 2015-12-10 ENCOUNTER — Encounter (HOSPITAL_COMMUNITY)
Admission: RE | Disposition: A | Discharge status: Home / Self Care | Payer: Self-pay | Source: Ambulatory Visit | Attending: Internal Medicine

## 2015-12-10 DIAGNOSIS — Z87891 Personal history of nicotine dependence: Secondary | ICD-10-CM | POA: Insufficient documentation

## 2015-12-10 DIAGNOSIS — Z903 Acquired absence of stomach [part of]: Secondary | ICD-10-CM | POA: Insufficient documentation

## 2015-12-10 DIAGNOSIS — C169 Malignant neoplasm of stomach, unspecified: Secondary | ICD-10-CM | POA: Diagnosis not present

## 2015-12-10 DIAGNOSIS — Z85828 Personal history of other malignant neoplasm of skin: Secondary | ICD-10-CM | POA: Insufficient documentation

## 2015-12-10 DIAGNOSIS — D509 Iron deficiency anemia, unspecified: Secondary | ICD-10-CM | POA: Diagnosis not present

## 2015-12-10 DIAGNOSIS — Z8546 Personal history of malignant neoplasm of prostate: Secondary | ICD-10-CM | POA: Diagnosis not present

## 2015-12-10 DIAGNOSIS — K259 Gastric ulcer, unspecified as acute or chronic, without hemorrhage or perforation: Secondary | ICD-10-CM | POA: Diagnosis not present

## 2015-12-10 DIAGNOSIS — K209 Esophagitis, unspecified without bleeding: Secondary | ICD-10-CM | POA: Insufficient documentation

## 2015-12-10 DIAGNOSIS — Z85028 Personal history of other malignant neoplasm of stomach: Secondary | ICD-10-CM | POA: Insufficient documentation

## 2015-12-10 DIAGNOSIS — K219 Gastro-esophageal reflux disease without esophagitis: Secondary | ICD-10-CM | POA: Diagnosis not present

## 2015-12-10 DIAGNOSIS — Z923 Personal history of irradiation: Secondary | ICD-10-CM | POA: Insufficient documentation

## 2015-12-10 DIAGNOSIS — C61 Malignant neoplasm of prostate: Secondary | ICD-10-CM | POA: Diagnosis not present

## 2015-12-10 DIAGNOSIS — Z79899 Other long term (current) drug therapy: Secondary | ICD-10-CM | POA: Diagnosis not present

## 2015-12-10 DIAGNOSIS — Z931 Gastrostomy status: Secondary | ICD-10-CM | POA: Insufficient documentation

## 2015-12-10 DIAGNOSIS — K222 Esophageal obstruction: Secondary | ICD-10-CM | POA: Diagnosis not present

## 2015-12-10 DIAGNOSIS — E785 Hyperlipidemia, unspecified: Secondary | ICD-10-CM

## 2015-12-10 HISTORY — DX: Gastrostomy status: Z93.1

## 2015-12-10 HISTORY — PX: BALLOON DILATION: SHX5330

## 2015-12-10 HISTORY — DX: Dysphagia, unspecified: R13.10

## 2015-12-10 HISTORY — PX: ESOPHAGOGASTRODUODENOSCOPY (EGD) WITH PROPOFOL: SHX5813

## 2015-12-10 SURGERY — ESOPHAGOGASTRODUODENOSCOPY (EGD) WITH PROPOFOL
Anesthesia: Monitor Anesthesia Care

## 2015-12-10 MED ORDER — JEVITY 1.5 CAL PO LIQD: 1000.0000 mL | ORAL | Status: DC

## 2015-12-10 MED ORDER — LACTATED RINGERS IV SOLN
INTRAVENOUS | Status: DC
  Administered 2015-12-10: 1000 mL via INTRAVENOUS

## 2015-12-10 MED ORDER — PRAVASTATIN SODIUM 40 MG PO TABS: 40.0000 mg | ORAL_TABLET | Freq: Every day | ORAL | Status: DC

## 2015-12-10 MED ORDER — FREE WATER: 240.0000 mL | Freq: Two times a day (BID) | Status: DC

## 2015-12-10 MED ORDER — LIDOCAINE HCL (CARDIAC) 20 MG/ML IV SOLN
INTRAVENOUS | Status: AC
  Filled 2015-12-10: qty 5

## 2015-12-10 MED ORDER — PROPOFOL 10 MG/ML IV BOLUS
INTRAVENOUS | Status: AC
  Filled 2015-12-10: qty 40

## 2015-12-10 MED ORDER — PROPOFOL 500 MG/50ML IV EMUL
INTRAVENOUS | Status: DC | PRN
Start: 2015-12-10 — End: 2015-12-10
  Administered 2015-12-10: 150 ug/kg/min via INTRAVENOUS

## 2015-12-10 MED ORDER — SODIUM CHLORIDE 0.9 % IV SOLN: INTRAVENOUS | Status: DC

## 2015-12-10 MED ORDER — PROPOFOL 500 MG/50ML IV EMUL
INTRAVENOUS | Status: DC | PRN
  Administered 2015-12-10: 40 mg via INTRAVENOUS
  Administered 2015-12-10: 20 mg via INTRAVENOUS

## 2015-12-10 SURGICAL SUPPLY — 14 items

## 2015-12-10 NOTE — Anesthesia Postprocedure Evaluation (Signed)
Anesthesia Post Note  Patient: James [name]  Procedure(s) Performed: Procedure(s) (LRB): ESOPHAGOGASTRODUODENOSCOPY (EGD) WITH PROPOFOL (N/A) BALLOON DILATION (N/A)  Patient location during evaluation: PACU Anesthesia Type: MAC Level of consciousness: awake and alert Pain management: pain level controlled Vital Signs Assessment: post-procedure vital signs reviewed and stable Respiratory status: spontaneous breathing, nonlabored ventilation, respiratory function stable and patient connected to nasal cannula oxygen Cardiovascular status: stable and blood pressure returned to baseline Anesthetic complications: no    Last Vitals:  Filed Vitals:   12/10/15 1010 12/10/15 1118  BP: 106/63 130/77  Pulse: 80 74  Temp: 36.6 C 36.6 C  Resp: 20 22    Last Pain: There were no vitals filed for this visit.               [name]

## 2015-12-10 NOTE — Anesthesia Preprocedure Evaluation (Addendum)
Anesthesia Evaluation  Patient identified by MRN, date of birth, ID band Patient awake    Reviewed: Allergy & Precautions, H&P , NPO status , Patient's Chart, lab work & pertinent test results  History of Anesthesia Complications Negative for: history of anesthetic complications  Airway Mallampati: II  TM Distance: >3 FB Neck ROM: full    Dental no notable dental hx. (+) Dental Advisory Given, Caps,    Pulmonary asthma , former smoker,    Pulmonary exam normal breath sounds clear to auscultation       Cardiovascular Exercise Tolerance: Good (-) hypertension(-) angina(-) Past MI negative cardio ROS Normal cardiovascular exam+ Valvular Problems/Murmurs  Rhythm:regular Rate:Normal     Neuro/Psych Depression negative neurological ROS  negative psych ROS   GI/Hepatic Neg liver ROS, PUD, GERD  Medicated and Controlled,Stomach cancer,SP resection    Endo/Other  negative endocrine ROS  Renal/GU negative Renal ROS  negative genitourinary   Musculoskeletal negative musculoskeletal ROS (+)   Abdominal   Peds  Hematology  (+) Blood dyscrasia, anemia , 11/37   Anesthesia Other Findings   Reproductive/Obstetrics                            Anesthesia Physical  Anesthesia Plan  ASA: III  Anesthesia Plan: MAC   Post-op Pain Management:    Induction: Intravenous  Airway Management Planned: Nasal Cannula  Additional Equipment:   Intra-op Plan:   Post-operative Plan:   Informed Consent: I have reviewed the patients History and Physical, chart, labs and discussed the procedure including the risks, benefits and alternatives for the proposed anesthesia with the patient or authorized representative who has indicated his/her understanding and acceptance.   Dental advisory given  Plan Discussed with: CRNA  Anesthesia Plan Comments: (Esophageal dilation)        Anesthesia Quick  Evaluation

## 2015-12-10 NOTE — H&P (Signed)
[facility] History and Physical   Primary Care Physician:  Binnie Rail, MD   Reason for Procedure:   Dilate GE junction stricture  Plan:    Upper endoscopy with stricture dilation     HPI: James [name] is a 70 y.o. 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 He has required repeate esophageal dilations. He is now having dysphagia again and also probably some odynophagia. Anorexia also since XRT started.   Past Medical History  Diagnosis Date  . Abnormal EKG     NS ST-T EKG Changes, (-) Nuclear Stress Test 03/2006  . Hyperlipemia   . Duodenal ulcer 1972    transfusion required, 1 unit packed cells  . Hx of basal cell carcinoma   . S/P radiation therapy 11/29/2014 through 01/23/2015     Prostate 7800 cGy in 40 sessions, seminal vesicles 5600 cGy in 40 sessions   . B12 deficiency 06/05/2015    B12 Low NL at 271 methtylmalonic acid high  . Transfusion history     '72 -s/p surgery for duodenal ulcer  . Anemia     iron deficency  . Prostate cancer ([place]) 09/06/14    last radiation 4'16   . Skin cancer, basal cell   . Cardiac murmur     nothing to be concerned with   . Asthma   . Esophageal reflux     no currlently  . 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 Allergy   . Gastrostomy tube in place [facility])     intermittent feeding  . Dysphagia     history throat/stomach  cancer "can swallow some, uses more pureed type foods"    Past Surgical History  Procedure Laterality Date  . Trigger finger release Left 1998  . Rotator cuff repair Right     R shoulder, [place]  . Knee arthroscopy Right     Right Knee, [place] [facility]  . Colonoscopy with polypectomy  02/2012    Dr Sharlett Iles  . Prostate biopsy  09/06/14  . Cataract extraction, bilateral Bilateral   . Vasectomy    . Eus N/A 06/14/2015    Procedure:  ESOPHAGEAL ENDOSCOPIC ULTRASOUND (EUS) RADIAL;  Surgeon: Milus Banister, MD;  Location: [facility];  Service: Endoscopy;  Laterality: N/A;  . Laparoscopy N/A 07/18/2015    Procedure: LAPAROSCOPY DIAGNOSTIC;  Surgeon: Stark Klein, MD;  Location: [facility];  Service: General;  Laterality: N/A;  . Laparoscopic gastrectomy N/A 07/18/2015    Procedure: SUB TOTAL GASTRECTOMY;  Surgeon: Stark Klein, MD;  Location: [facility];  Service: General;  Laterality: N/A;  . Laparoscopic gastrostomy N/A 07/18/2015    Procedure: FEEDING TUBE PLACEMENT;  Surgeon: Stark Klein, MD;  Location: [facility];  Service: General;  Laterality: N/A;  . Esophagogastroduodenoscopy (egd) with propofol N/A 08/17/2015    Procedure: ESOPHAGOGASTRODUODENOSCOPY (EGD) WITH PROPOFOL;  Surgeon: Gatha Mayer, MD;  Location: [facility];  Service: Endoscopy;  Laterality: N/A;  . Esophagogastroduodenoscopy (egd) with propofol N/A 09/12/2015    Procedure: ESOPHAGOGASTRODUODENOSCOPY (EGD) WITH PROPOFOL;  Surgeon: Gatha Mayer, MD;  Location: [facility];  Service: Endoscopy;  Laterality: N/A;  . Balloon dilation N/A 09/12/2015    Procedure: BALLOON DILATION;  Surgeon: Gatha Mayer, MD;  Location: [facility];  Service: Endoscopy;  Laterality: N/A;  . Esophagogastroduodenoscopy (egd) with propofol N/A 09/21/2015    Procedure: ESOPHAGOGASTRODUODENOSCOPY (EGD) WITH PROPOFOL;  Surgeon: Gatha Mayer,  MD;  Location: [facility];  Service: Endoscopy;  Laterality: N/A;  . Balloon dilation N/A 09/21/2015    Procedure: BALLOON DILATION;  Surgeon: Gatha Mayer, MD;  Location: [facility];  Service: Endoscopy;  Laterality: N/A;  . Esophagogastroduodenoscopy (egd) with propofol N/A 10/04/2015    Procedure: ESOPHAGOGASTRODUODENOSCOPY (EGD) WITH PROPOFOL;  Surgeon: Milus Banister, MD;  Location: [facility];  Service: Endoscopy;  Laterality: N/A;  possible stricture  . Balloon dilation N/A 10/04/2015    Procedure: BALLOON DILATION;  Surgeon:  Milus Banister, MD;  Location: [facility];  Service: Endoscopy;  Laterality: N/A;    Prior to Admission medications   Medication Sig Start Date End Date Taking? Authorizing Provider  ferrous sulfate 325 (65 FE) MG EC tablet Take 1 tablet (325 mg total) by mouth daily. 09/06/15  Yes Ladell Pier, MD  hyaluronate sodium (RADIAPLEXRX) GEL Apply 1 application topically daily. Reported on 11/02/2015 10/30/15  Yes Kyung Rudd, MD  Nutritional Supplements (FEEDING SUPPLEMENT, JEVITY 1.5 CAL,) LIQD Place 1,000 mLs into feeding tube continuous. Put into J tube via pump at 40cc/h Patient taking differently: Place 1,000 mLs into feeding tube continuous. Put into J tube via pump at 65cc/h- 12 hours a day 07/24/15  Yes Arta Bruce Kinsinger, MD  omeprazole (PRILOSEC) 40 MG capsule Take 1 capsule (40 mg total) by mouth 2 (two) times daily before a meal. 30 MINS BEFORE BREAKFAST/SUPPER 08/17/15  Yes Gatha Mayer, MD  ondansetron (ZOFRAN) 8 MG tablet Take 8 mg by mouth every 8 (eight) hours as needed for nausea or vomiting. Reported on 12/06/2015 10/30/15  Yes Hayden Pedro, PA-C  pravastatin (PRAVACHOL) 40 MG tablet Take 1 tablet (40 mg total) by mouth daily. HOLD UNTIL SWALLOWING BETTER Patient taking differently: Take 40 mg by mouth daily.  08/17/15  Yes Gatha Mayer, MD  PRESCRIPTION MEDICATION Oncology treatment [place]   Yes Historical Provider, MD  prochlorperazine (COMPAZINE) 10 MG tablet Take 10 mg by mouth every 6 (six) hours as needed for nausea or vomiting. Reported on 11/16/2015 10/31/15  Yes Hayden Pedro, PA-C  Water For Irrigation, Sterile (FREE WATER) SOLN Place 240 mLs into feeding tube 4 (four) times daily. Patient taking differently: Place 240 mLs into feeding tube 2 (two) times daily.  07/24/15  Yes Arta Bruce Kinsinger, MD  capecitabine (XELODA) 500 MG tablet Take 2 tablets my mouth twice daily after meals Monday-Friday on days of Radiation Only. Patient not taking: Reported on  12/06/2015 11/27/15   Ladell Pier, MD  sucralfate (CARAFATE) 1 g tablet Take 1 tablet (1 g total) by mouth 4 (four) times daily. Patient not taking: Reported on 12/06/2015 11/16/15   Brunetta Genera, MD    Current Facility-Administered Medications  Medication Dose Route Frequency Provider Last Rate Last Dose  . 0.9 %  sodium chloride infusion   Intravenous Continuous Gatha Mayer, MD      . lactated ringers infusion   Intravenous Continuous Gatha Mayer, MD 10 mL/hr at 12/10/15 1021 1,000 mL at 12/10/15 1021    Allergies as of 12/06/2015 - Review Complete 12/06/2015  Allergen Reaction Noted  . Codeine      Family History  Problem Relation Age of Onset  . Diabetes Father   . Heart attack Father 71  . Liver disease Father     Related to Alcohol Use   . Alcohol abuse Mother   . Cirrhosis Mother     died in her 97s  .  Multiple myeloma Brother   . Colon cancer Neg Hx   . Stomach cancer Neg Hx   . Stroke Neg Hx   . Diabetes Sister     Social History   Social History  . Marital Status: Married    Spouse Name: N/A  . Number of Children: 2  . Years of Education: N/A   Occupational History  . retired    Social History Main Topics  . Smoking status: Former Smoker -- 0.50 packs/day for 5 years    Types: Cigarettes    Quit date: 10/07/1967  . Smokeless tobacco: Never Used     Comment: smoked [place] , up to < 1 ppd  . Alcohol Use: 0.0 oz/week     Comment:  < 3 / week- none in   month  . Drug Use: No  . Sexual Activity: Not on file   Other Topics Concern  . Not on file   Social History Narrative   Married and retired - 1 son and 1 daughter    Lives at [place] and active golfer, tennis player   05/30/2015       Review of Systems: Positive for fatigue, soreness at surgical scar and radiation area. All other review of systems negative except as mentioned in the HPI.  Physical Exam: Vital signs in last 24 hours: Temp:  [97.9 F (36.6 C)] 97.9 F (36.6  C) (03/06 1010) Pulse Rate:  [80] 80 (03/06 1010) Resp:  [20] 20 (03/06 1010) BP: (106)/(63) 106/63 mmHg (03/06 1010) SpO2:  [95 %] 95 % (03/06 1010) Weight:  [165 lb (74.844 kg)] 165 lb (74.844 kg) (03/06 1010)   General:   Alert,  Well-developed, well-nourished, pleasant and cooperative in NAD Lungs:  Clear throughout to auscultation.   Heart:  Regular rate and rhythm; no murmurs, clicks, rubs,  or gallops. Abdomen:  Soft, nondistended. Normal bowel sounds.  Mildly tender at xiphoid scar and XRT site. J tube in left abdomen. Neuro/Psych:  Alert and cooperative. Normal mood and affect. A and O x 3   @Carl  Simonne Maffucci, MD, [facility] [phone] (pager) 12/10/2015 10:36 AM@

## 2015-12-10 NOTE — Transfer of Care (Signed)
Immediate Anesthesia Transfer of Care Note  Patient: James [name]  Procedure(s) Performed: Procedure(s): ESOPHAGOGASTRODUODENOSCOPY (EGD) WITH PROPOFOL (N/A) BALLOON DILATION (N/A)  Patient Location: PACU  Anesthesia Type:MAC  Level of Consciousness: awake, alert  and oriented  Airway & Oxygen Therapy: Patient Spontanous Breathing and Patient connected to nasal cannula oxygen  Post-op Assessment: Report given to RN and Post -op Vital signs reviewed and stable  Post vital signs: Reviewed and stable  Last Vitals:  Filed Vitals:   12/10/15 1010  BP: 106/63  Pulse: 80  Temp: 36.6 C  Resp: 20    Complications: No apparent anesthesia complications

## 2015-12-10 NOTE — Discharge Instructions (Signed)
° °  The stricture was open today! You have some esophagitis - not bad - and most likely from the radiation. I dilated to 16.5 mm.  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  Please call me in 1-2 weeks with an update - sooner if not better after this. If the lack of appetite remains a problem restarting mirtazipine could help.  I appreciate the opportunity to care for you. Gatha Mayer, MD, FACG  YOU HAD AN ENDOSCOPIC PROCEDURE TODAY: Refer to the procedure report and other information in the discharge instructions given to you for any specific questions about what was found during the examination. If this information does not answer your questions, please call Dr. Celesta Aver office at [phone] to clarify.   YOU SHOULD EXPECT: Some feelings of bloating in the abdomen. Passage of more gas than usual. Walking can help get rid of the air that was put into your GI tract during the procedure and reduce the bloating. If you had a lower endoscopy (such as a colonoscopy or flexible sigmoidoscopy) you may notice spotting of blood in your stool or on the toilet paper. Some abdominal soreness may be present for a day or two, also.  DIET: Your first meal following the procedure should be a light meal and then it is ok to progress to your normal diet. A half-sandwich or bowl of soup is an example of a good first meal. Heavy or fried foods are harder to digest and may make you feel nauseous or bloated. Drink plenty of fluids but you should avoid alcoholic beverages for 24 hours.   ACTIVITY: Your care partner should take you home directly after the procedure. You should plan to take it easy, moving slowly for the rest of the day. You can resume normal activity the day after the procedure however YOU SHOULD NOT DRIVE, use power tools, machinery or perform tasks that involve climbing or major physical exertion for 24 hours (because of the sedation medicines  used during the test).   SYMPTOMS TO REPORT IMMEDIATELY: A gastroenterologist can be reached at any hour. Please call [phone]  for any of the following symptoms:  Following lower endoscopy (colonoscopy, flexible sigmoidoscopy) Excessive amounts of blood in the stool  Significant tenderness, worsening of abdominal pains  Swelling of the abdomen that is new, acute  Fever of 100 or higher  Following upper endoscopy (EGD, EUS, ERCP, esophageal dilation) Vomiting of blood or coffee ground material  New, significant abdominal pain  New, significant chest pain or pain under the shoulder blades  Painful or persistently difficult swallowing  New shortness of breath  Black, tarry-looking or red, bloody stools  FOLLOW UP:  If any biopsies were taken you will be contacted by phone or by letter within the next 1-3 weeks. Call [phone]  if you have not heard about the biopsies in 3 weeks.  Please also call with any specific questions about appointments or follow up tests.

## 2015-12-10 NOTE — Op Note (Signed)
[facility] [place], [id number]   ENDOSCOPY PROCEDURE REPORT  PATIENT: James, [name]  MR#: [id number] BIRTHDATE: [birth year] , 61  yrs. old GENDER: male ENDOSCOPIST: Gatha Mayer, MD, [facility] REFERRED BY: PROCEDURE DATE:  12/10/2015 PROCEDURE:  EGD w/ biopsy and EGD w/ balloon dilation ASA CLASS:     Class II INDICATIONS:  therapeutic procedure, dysphagia, and recurrent anastomotic stricture. MEDICATIONS: Monitored anesthesia care and Per Anesthesia TOPICAL ANESTHETIC: none DESCRIPTION OF PROCEDURE: After the risks benefits and alternatives of the procedure were thoroughly explained, informed consent was obtained.  The Pentax Gastroscope [id number] endoscope was introduced through the mouth and advanced to the second portion of the duodenum , Without limitations.  The instrument was slowly withdrawn as the mucosa was fully examined.  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  Retroflexed views revealed as previously described.     The scope was then withdrawn from the patient and the procedure completed.  COMPLICATIONS: There were no immediate complications.  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  Clear liquids until 1230 , then soft foods rest of day.  Resume prior diet tomorrow. 2.  Call back 1-2 weeks with update - sooner if not better 3.  Resume sucralfate as it can help XRT esophagitis 4.  Await pathology results 5. If anorexia persists consider restarting mirtazapine.   eSigned:  Gatha Mayer, MD, [facility] 12/10/2015 11:30 AM    CC: The Patient, Drs. Gayleen Orem, [address] [place], Dorris Fetch [place]

## 2015-12-11 ENCOUNTER — Ambulatory Visit (HOSPITAL_COMMUNITY): Payer: Medicare Other

## 2015-12-12 ENCOUNTER — Telehealth: Payer: Self-pay | Admitting: *Deleted

## 2015-12-12 NOTE — Progress Notes (Signed)
Quick Note:  Inflammation - no cancer - infection stains being performed ______

## 2015-12-12 NOTE — Telephone Encounter (Signed)
Oncology Nurse Navigator Documentation  Oncology Nurse Navigator Flowsheets 12/12/2015  Navigator Location [facility]  Navigator Encounter Type Telephone  Telephone Outgoing Call;Patient Update  Patient Visit Type -  Treatment Phase -  Barriers/Navigation Needs -  Education -  Interventions -  Coordination of Care -  Education Method -  Support Groups/Services -  Acuity Level 1  Time Spent with Patient 15  Called to follow up on status post his EGD procedure: was helpful and is aware that it was negative for cancer. Swallowing has improved. Still using J-tube (3 cans during the night). J-tube site is much improved after course of antibiotic, oxycodone and warm compress per Dr. Marlowe Aschoff office. Still has some discomfort in epigastric area, especially with sitting a prolonged time or coughing. Has his annual appointment at [facility] on 3/15 and will return to [facility] on 3/17 for his post RT chemotherapy. Appreciated the call.

## 2015-12-13 DIAGNOSIS — Z934 Other artificial openings of gastrointestinal tract status: Secondary | ICD-10-CM | POA: Diagnosis not present

## 2015-12-14 NOTE — Progress Notes (Signed)
Quick Note:    No infection  ______

## 2015-12-21 ENCOUNTER — Telehealth: Payer: Self-pay | Admitting: *Deleted

## 2015-12-21 ENCOUNTER — Telehealth: Payer: Self-pay | Admitting: Oncology

## 2015-12-21 ENCOUNTER — Ambulatory Visit (HOSPITAL_BASED_OUTPATIENT_CLINIC_OR_DEPARTMENT_OTHER): Payer: Medicare Other | Admitting: Oncology

## 2015-12-21 ENCOUNTER — Other Ambulatory Visit (HOSPITAL_BASED_OUTPATIENT_CLINIC_OR_DEPARTMENT_OTHER): Payer: Medicare Other

## 2015-12-21 ENCOUNTER — Ambulatory Visit: Payer: Medicare Other | Admitting: Nutrition

## 2015-12-21 ENCOUNTER — Ambulatory Visit (HOSPITAL_BASED_OUTPATIENT_CLINIC_OR_DEPARTMENT_OTHER): Payer: Medicare Other

## 2015-12-21 ENCOUNTER — Encounter: Payer: Self-pay | Admitting: *Deleted

## 2015-12-21 VITALS — BP 109/69 | HR 97 | Temp 98.3°F | Resp 18 | Ht 67.0 in | Wt 162.0 lb

## 2015-12-21 DIAGNOSIS — C160 Malignant neoplasm of cardia: Secondary | ICD-10-CM

## 2015-12-21 DIAGNOSIS — D709 Neutropenia, unspecified: Secondary | ICD-10-CM | POA: Diagnosis not present

## 2015-12-21 DIAGNOSIS — E538 Deficiency of other specified B group vitamins: Secondary | ICD-10-CM

## 2015-12-21 DIAGNOSIS — C165 Malignant neoplasm of lesser curvature of stomach, unspecified: Secondary | ICD-10-CM

## 2015-12-21 DIAGNOSIS — C161 Malignant neoplasm of fundus of stomach: Secondary | ICD-10-CM

## 2015-12-21 DIAGNOSIS — Z5111 Encounter for antineoplastic chemotherapy: Secondary | ICD-10-CM

## 2015-12-21 LAB — CBC WITH DIFFERENTIAL/PLATELET
BASO%: 0.4 % (ref 0.0–2.0)
BASOS ABS: 0 10*3/uL (ref 0.0–0.1)
EOS ABS: 0.1 10*3/uL (ref 0.0–0.5)
EOS%: 1.9 % (ref 0.0–7.0)
HCT: 35.4 % — ABNORMAL LOW (ref 38.4–49.9)
HGB: 11.7 g/dL — ABNORMAL LOW (ref 13.0–17.1)
LYMPH%: 37.5 % (ref 14.0–49.0)
MCH: 30.4 pg (ref 27.2–33.4)
MCHC: 33.2 g/dL (ref 32.0–36.0)
MCV: 91.6 fL (ref 79.3–98.0)
MONO#: 0.4 10*3/uL (ref 0.1–0.9)
MONO%: 15.5 % — AB (ref 0.0–14.0)
NEUT#: 1.3 10*3/uL — ABNORMAL LOW (ref 1.5–6.5)
NEUT%: 44.7 % (ref 39.0–75.0)
Platelets: 84 10*3/uL — ABNORMAL LOW (ref 140–400)
RBC: 3.86 10*6/uL — AB (ref 4.20–5.82)
RDW: 21.1 % — AB (ref 11.0–14.6)
WBC: 2.8 10*3/uL — AB (ref 4.0–10.3)
lymph#: 1.1 10*3/uL (ref 0.9–3.3)

## 2015-12-21 LAB — COMPREHENSIVE METABOLIC PANEL
ALT: <9 U/L (ref 0–55)
ANION GAP: 6 meq/L (ref 3–11)
AST: 16 U/L (ref 5–34)
Albumin: 3.3 g/dL — ABNORMAL LOW (ref 3.5–5.0)
Alkaline Phosphatase: 80 U/L (ref 40–150)
BUN: 14.5 mg/dL (ref 7.0–26.0)
CO2: 28 meq/L (ref 22–29)
Calcium: 9 mg/dL (ref 8.4–10.4)
Chloride: 106 mEq/L (ref 98–109)
Creatinine: 0.7 mg/dL (ref 0.7–1.3)
GLUCOSE: 104 mg/dL (ref 70–140)
POTASSIUM: 4.3 meq/L (ref 3.5–5.1)
SODIUM: 140 meq/L (ref 136–145)
Total Bilirubin: 0.49 mg/dL (ref 0.20–1.20)
Total Protein: 6.7 g/dL (ref 6.4–8.3)

## 2015-12-21 MED ORDER — PROCHLORPERAZINE MALEATE 10 MG PO TABS
ORAL_TABLET | ORAL | Status: AC
  Filled 2015-12-21: qty 1

## 2015-12-21 MED ORDER — FLUOROURACIL CHEMO INJECTION 2.5 GM/50ML
500.0000 mg/m2 | Freq: Once | INTRAVENOUS | Status: AC
  Administered 2015-12-21: 950 mg via INTRAVENOUS
  Filled 2015-12-21: qty 19

## 2015-12-21 MED ORDER — SODIUM CHLORIDE 0.9 % IV SOLN
Freq: Once | INTRAVENOUS | Status: AC
  Administered 2015-12-21: 11:00:00 via INTRAVENOUS

## 2015-12-21 MED ORDER — PROCHLORPERAZINE MALEATE 10 MG PO TABS
10.0000 mg | ORAL_TABLET | Freq: Once | ORAL | Status: AC
  Administered 2015-12-21: 10 mg via ORAL

## 2015-12-21 MED ORDER — LEUCOVORIN CALCIUM INJECTION 350 MG
500.0000 mg/m2 | Freq: Once | INTRAMUSCULAR | Status: AC
  Administered 2015-12-21: 970 mg via INTRAVENOUS
  Filled 2015-12-21: qty 48.5

## 2015-12-21 NOTE — Progress Notes (Signed)
Nutrition follow-up completed with patient and wife, during chemotherapy for gastric mass. Patient is status post subtotal gastrectomy with jejunostomy feeding tube placement. Patient is utilizing Jevity 1.5 nocturnally tolerating 3 cans overnight Tube feeding is providing 1065 cal, 45 g protein, 540 mL free water. Patient is trying to eat small frequent meals and snacks but reports early satiety. Weight has decreased was documented as 162 pounds March 17 decreased from 173.9 pounds on December 15.  Nutrition diagnosis: Food and nutrition related knowledge deficit improved. Unintended weight loss related to inadequate oral intake as evidenced by 14 pound weight loss over the past 3 months.  Intervention: Educated patient to continue high-calorie, high-protein soft moist foods as tolerated for easier swallowing and promoting weight gain and healing. Patient should continue current tube feedings of Jevity 1.5 3 cans nightly Provided suggestions to increase calorie level, during the day. Questions were answered.  Teach back method used.  Monitoring, evaluation, goals: Patient will work to increase calories and protein by mouth and continue jejunostomy tube feedings to minimize weight loss.  Next visit: To be scheduled as needed.  **Disclaimer: This note was dictated with voice recognition software. Similar sounding words can inadvertently be transcribed and this note may contain transcription errors which may not have been corrected upon publication of note.**

## 2015-12-21 NOTE — Patient Instructions (Signed)
[facility] Discharge Instructions for Patients Receiving Chemotherapy  Today you received the following chemotherapy agents;  Leucovorin and Fluorouracil.   To help prevent nausea and vomiting after your treatment, we encourage you to take your nausea medication as directed.    If you develop nausea and vomiting that is not controlled by your nausea medication, call the clinic.   BELOW ARE SYMPTOMS THAT SHOULD BE REPORTED IMMEDIATELY:  *FEVER GREATER THAN 100.5 F  *CHILLS WITH OR WITHOUT FEVER  NAUSEA AND VOMITING THAT IS NOT CONTROLLED WITH YOUR NAUSEA MEDICATION  *UNUSUAL SHORTNESS OF BREATH  *UNUSUAL BRUISING OR BLEEDING  TENDERNESS IN MOUTH AND THROAT WITH OR WITHOUT PRESENCE OF ULCERS  *URINARY PROBLEMS  *BOWEL PROBLEMS  UNUSUAL RASH Items with * indicate a potential emergency and should be followed up as soon as possible.  Feel free to call the clinic you have any questions or concerns. The clinic phone number is [phone].  Please show the CHEMO ALERT CARD at check-in to the Emergency Department and triage nurse.   

## 2015-12-21 NOTE — Telephone Encounter (Signed)
Per staff message and POF I have scheduled appts. Advised scheduler of appts. JMW  

## 2015-12-21 NOTE — Telephone Encounter (Signed)
Gave and printed appt sched and avs fo rpt for March April and May

## 2015-12-21 NOTE — Progress Notes (Signed)
  [facility] OFFICE PROGRESS NOTE   Diagnosis: Gastric [name]  INTERVAL HISTORY:   James [name] returns as scheduled. He completed Xeloda and radiation 3 2017. He underwent a repeat endoscopy and esophageal dilatation on 12/10/2015. He denies dysphagia and odynophagia at present. He continues nighttime tube feedings and is eating a regular diet during the day. He is anxious to get the feeding tube out.    Objective:  Vital signs in last 24 hours:  Blood pressure 109/69, pulse 97, temperature 98.3 F (36.8 C), temperature source Oral, resp. rate 18, height 5\' 7"  (1.702 m), weight 162 lb (73.483 kg), SpO2 100 %.    HEENT: No thrush or ulcers Resp: Lungs clear bilaterally Cardio: Regular rate and rhythm GI: No hepatosplenomegaly, no mass, left upper quadrant feeding tube site with a small amount of mucoid drainage, no evidence of infection Vascular: No leg edema   Lab Results:  Lab Results  Component Value Date   WBC 2.8* 12/21/2015   HGB 11.7* 12/21/2015   HCT 35.4* 12/21/2015   MCV 91.6 12/21/2015   PLT 84* 12/21/2015   NEUTROABS 1.3* 12/21/2015   Medications: I have reviewed the patient's current medications.  Assessment/Plan: 1. Gastric [name]-adenocarcinoma of the gastric fundus/cardia  Staging CT scans 06/07/2015 with no evidence of distant metastatic disease, tiny nonspecific pulmonary nodules and liver lesions-likely benign  Subtotal gastrectomy and placement of a jejunostomy feeding tube 07/18/2015 for a T2,N0 tumor with negative surgical margins  Initiation of adjuvant 5-FU/leucovorin 08/28/2015, status post 3 weekly treatments completed 09/13/2015  Initiation of radiation and concurrent Xeloda 10/29/2015  11/26/2015 Xeloda dose reduced to 1000 mg twice daily due to progressive thrombocytopenia  Radiation completed 12/06/2015  5-FU/leucovorin resumed 12/21/2015   2. History of microcytic anemia-likely iron deficiency anemia secondary to  #1  3. Possible Vitamin B-12 deficiency, based on high methylmalonic acid level -status post vitamin B-12 replacement  4. 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 Anastomotic stricture-status post balloon dilatation 08/22/2015, status post electroincision dilation at [facility] 10/11/2015, repeat EGD/ dilatation by Dr. Carlean Purl 11/01/2015 and 12/10/2015  7. Mild neutropenia/thrombocytopenia secondary to chemotherapy. Progressive thrombocytopenia 11/26/2015. Xeloda dose reduced to 1000 mg twice daily.  8.Erythema/tenderness at the jejunostomy feeding tube site 09/20/2015, improved   Disposition:  James [name] appears stable. The dysphagia has improved. He completed adjuvant Xeloda/radiation 12/06/2015. He is scheduled to resume adjuvant 5-FU/leucovorin today. He would like to complete the scheduled 5-FU/leucovorin today and then discontinue adjuvant chemotherapy.  I explained the standard course of adjuvant therapy would include several additional four-week cycles of 5-FU/leucovorin. He understands this and states he will likely discontinue treatment after today. He will contact us early next week with his final decision.  He will return for an office visit, CBC, ferritin, and vitamin B-12 level in 2 weeks.  He has mild neutropenia today. He knows to contact us for a fever or symptoms of an infection.  James [name] will see Dr. Barry Dienes to discuss removal of the feeding tube.  Betsy Coder, MD  12/21/2015  9:51 AM

## 2015-12-21 NOTE — Progress Notes (Signed)
[place] Work  Clinical Social Work was referred by Futures trader for assessment of psychosocial needs due to pt wanting information about exercise programs .  Clinical Social Worker met with patient at [facility], after MD appointment to offer support and assess for needs.  CSW reviewed exercise options available for patients. CSW discussed Live Strong program at the [facility] [place] and yoga/tai chi at [facility]. Pt and wife will review options and follow up as needed.   Clinical Social Work interventions: Resource education  Loren Racer, [employer] Worker [place]  [place] Phone: [phone] Fax: [phone]

## 2015-12-21 NOTE — Progress Notes (Signed)
Okay to treat with platelets today of 84 per Dr. Benay Spice.

## 2015-12-24 ENCOUNTER — Telehealth: Payer: Self-pay | Admitting: *Deleted

## 2015-12-24 NOTE — Telephone Encounter (Signed)
  Oncology Nurse Navigator Documentation  Navigator Location: [facility] (12/24/15 1214) Navigator Encounter Type: Telephone (12/24/15 1214) Telephone: Outgoing Call (12/24/15 1214)  Patient had left message on Friday requesting to speak with nurse navigator; was not able to do so that day. Called and left patient a message with direct contact # for him to call if he still needed to speak with this RN.

## 2015-12-28 ENCOUNTER — Ambulatory Visit (HOSPITAL_BASED_OUTPATIENT_CLINIC_OR_DEPARTMENT_OTHER): Payer: Medicare Other

## 2015-12-28 ENCOUNTER — Other Ambulatory Visit: Payer: Self-pay | Admitting: Medical Oncology

## 2015-12-28 ENCOUNTER — Other Ambulatory Visit: Payer: Self-pay | Admitting: *Deleted

## 2015-12-28 ENCOUNTER — Telehealth: Payer: Self-pay | Admitting: *Deleted

## 2015-12-28 VITALS — BP 99/61 | HR 81 | Temp 98.2°F

## 2015-12-28 DIAGNOSIS — Z5111 Encounter for antineoplastic chemotherapy: Secondary | ICD-10-CM

## 2015-12-28 DIAGNOSIS — C161 Malignant neoplasm of fundus of stomach: Secondary | ICD-10-CM

## 2015-12-28 DIAGNOSIS — C160 Malignant neoplasm of cardia: Secondary | ICD-10-CM

## 2015-12-28 DIAGNOSIS — C165 Malignant neoplasm of lesser curvature of stomach, unspecified: Secondary | ICD-10-CM

## 2015-12-28 LAB — CBC WITH DIFFERENTIAL/PLATELET
BASO%: 0.3 % (ref 0.0–2.0)
Basophils Absolute: 0 10*3/uL (ref 0.0–0.1)
EOS%: 1.7 % (ref 0.0–7.0)
Eosinophils Absolute: 0 10*3/uL (ref 0.0–0.5)
HCT: 29.8 % — ABNORMAL LOW (ref 38.4–49.9)
HGB: 10.3 g/dL — ABNORMAL LOW (ref 13.0–17.1)
LYMPH%: 27.5 % (ref 14.0–49.0)
MCH: 31.9 pg (ref 27.2–33.4)
MCHC: 34.6 g/dL (ref 32.0–36.0)
MCV: 92.2 fL (ref 79.3–98.0)
MONO#: 0.7 10*3/uL (ref 0.1–0.9)
MONO%: 22.8 % — AB (ref 0.0–14.0)
NEUT%: 47.7 % (ref 39.0–75.0)
NEUTROS ABS: 1.4 10*3/uL — AB (ref 1.5–6.5)
PLATELETS: 88 10*3/uL — AB (ref 140–400)
RBC: 3.24 10*6/uL — AB (ref 4.20–5.82)
RDW: 20.7 % — ABNORMAL HIGH (ref 11.0–14.6)
WBC: 2.9 10*3/uL — AB (ref 4.0–10.3)
lymph#: 0.8 10*3/uL — ABNORMAL LOW (ref 0.9–3.3)

## 2015-12-28 LAB — COMPREHENSIVE METABOLIC PANEL
AST: 14 U/L (ref 5–34)
Albumin: 3.2 g/dL — ABNORMAL LOW (ref 3.5–5.0)
Alkaline Phosphatase: 65 U/L (ref 40–150)
Anion Gap: 4 mEq/L (ref 3–11)
BILIRUBIN TOTAL: 0.44 mg/dL (ref 0.20–1.20)
BUN: 14.9 mg/dL (ref 7.0–26.0)
CHLORIDE: 106 meq/L (ref 98–109)
CO2: 29 meq/L (ref 22–29)
CREATININE: 0.8 mg/dL (ref 0.7–1.3)
Calcium: 8.7 mg/dL (ref 8.4–10.4)
EGFR: >90 mL/min/{1.73_m2} (ref >90)
GLUCOSE: 104 mg/dL (ref 70–140)
Potassium: 4.7 mEq/L (ref 3.5–5.1)
SODIUM: 140 meq/L (ref 136–145)
TOTAL PROTEIN: 6.2 g/dL — AB (ref 6.4–8.3)

## 2015-12-28 MED ORDER — FLUOROURACIL CHEMO INJECTION 2.5 GM/50ML
500.0000 mg/m2 | Freq: Once | INTRAVENOUS | Status: AC
  Administered 2015-12-28: 950 mg via INTRAVENOUS
  Filled 2015-12-28: qty 19

## 2015-12-28 MED ORDER — HEPARIN SOD (PORK) LOCK FLUSH 100 UNIT/ML IV SOLN
500.0000 [IU] | Freq: Once | INTRAVENOUS | Status: DC | PRN
  Filled 2015-12-28: qty 5

## 2015-12-28 MED ORDER — PROCHLORPERAZINE MALEATE 10 MG PO TABS
10.0000 mg | ORAL_TABLET | Freq: Once | ORAL | Status: AC
  Administered 2015-12-28: 10 mg via ORAL

## 2015-12-28 MED ORDER — LEUCOVORIN CALCIUM INJECTION 350 MG
500.0000 mg/m2 | Freq: Once | INTRAVENOUS | Status: AC
  Administered 2015-12-28: 970 mg via INTRAVENOUS
  Filled 2015-12-28: qty 48.5

## 2015-12-28 MED ORDER — SODIUM CHLORIDE 0.9 % IJ SOLN
10.0000 mL | INTRAMUSCULAR | Status: DC | PRN
  Filled 2015-12-28: qty 10

## 2015-12-28 MED ORDER — SODIUM CHLORIDE 0.9 % IV SOLN
Freq: Once | INTRAVENOUS | Status: AC
  Administered 2015-12-28: 11:00:00 via INTRAVENOUS

## 2015-12-28 NOTE — Patient Instructions (Signed)
[facility] Discharge Instructions for Patients Receiving Chemotherapy  Today you received the following chemotherapy agents: Leucovorin and 5FU.  To help prevent nausea and vomiting after your treatment, we encourage you to take your nausea medication: as directed.   If you develop nausea and vomiting that is not controlled by your nausea medication, call the clinic.   BELOW ARE SYMPTOMS THAT SHOULD BE REPORTED IMMEDIATELY:  *FEVER GREATER THAN 100.5 F  *CHILLS WITH OR WITHOUT FEVER  NAUSEA AND VOMITING THAT IS NOT CONTROLLED WITH YOUR NAUSEA MEDICATION  *UNUSUAL SHORTNESS OF BREATH  *UNUSUAL BRUISING OR BLEEDING  TENDERNESS IN MOUTH AND THROAT WITH OR WITHOUT PRESENCE OF ULCERS  *URINARY PROBLEMS  *BOWEL PROBLEMS  UNUSUAL RASH Items with * indicate a potential emergency and should be followed up as soon as possible.  Feel free to call the clinic you have any questions or concerns. The clinic phone number is [phone].  Please show the CHEMO ALERT CARD at check-in to the Emergency Department and triage nurse.   

## 2015-12-28 NOTE — Progress Notes (Signed)
CBC reviewed with Ned Card, NP: OK to treat with ANC 1.4 and PLT 88k. Diane, Infusion RN notified.

## 2015-12-28 NOTE — Telephone Encounter (Signed)
  Oncology Nurse Navigator Documentation  Navigator Location: [facility] (12/28/15 0833) Navigator Encounter Type: Telephone (12/28/15 [id number]) Telephone: Jerilee Hoh Confirmation/Clarification;Patient Update (12/28/15 [id number])    Left VM requesting return call to inform us of his decision regarding treatment-continue or discontinue? Provided navigator direct #.

## 2016-01-01 DIAGNOSIS — C161 Malignant neoplasm of fundus of stomach: Secondary | ICD-10-CM | POA: Diagnosis not present

## 2016-01-01 DIAGNOSIS — Z934 Other artificial openings of gastrointestinal tract status: Secondary | ICD-10-CM | POA: Diagnosis not present

## 2016-01-04 ENCOUNTER — Ambulatory Visit: Payer: Medicare Other

## 2016-01-04 ENCOUNTER — Telehealth: Payer: Self-pay | Admitting: Oncology

## 2016-01-04 ENCOUNTER — Ambulatory Visit (HOSPITAL_BASED_OUTPATIENT_CLINIC_OR_DEPARTMENT_OTHER): Payer: Medicare Other | Admitting: Nurse Practitioner

## 2016-01-04 ENCOUNTER — Ambulatory Visit (HOSPITAL_BASED_OUTPATIENT_CLINIC_OR_DEPARTMENT_OTHER): Payer: Medicare Other

## 2016-01-04 ENCOUNTER — Other Ambulatory Visit (HOSPITAL_BASED_OUTPATIENT_CLINIC_OR_DEPARTMENT_OTHER): Payer: Medicare Other

## 2016-01-04 VITALS — BP 124/74 | HR 81 | Temp 98.2°F | Resp 18 | Ht 67.0 in | Wt 162.4 lb

## 2016-01-04 DIAGNOSIS — E538 Deficiency of other specified B group vitamins: Secondary | ICD-10-CM | POA: Diagnosis not present

## 2016-01-04 DIAGNOSIS — Z5111 Encounter for antineoplastic chemotherapy: Secondary | ICD-10-CM | POA: Diagnosis not present

## 2016-01-04 DIAGNOSIS — C165 Malignant neoplasm of lesser curvature of stomach, unspecified: Secondary | ICD-10-CM

## 2016-01-04 DIAGNOSIS — C161 Malignant neoplasm of fundus of stomach: Secondary | ICD-10-CM

## 2016-01-04 DIAGNOSIS — C160 Malignant neoplasm of cardia: Secondary | ICD-10-CM

## 2016-01-04 DIAGNOSIS — D6959 Other secondary thrombocytopenia: Secondary | ICD-10-CM | POA: Diagnosis not present

## 2016-01-04 DIAGNOSIS — D701 Agranulocytosis secondary to cancer chemotherapy: Secondary | ICD-10-CM | POA: Diagnosis not present

## 2016-01-04 LAB — CBC WITH DIFFERENTIAL/PLATELET
BASO%: 0.5 % (ref 0.0–2.0)
Basophils Absolute: 0 10*3/uL (ref 0.0–0.1)
EOS ABS: 0.1 10*3/uL (ref 0.0–0.5)
EOS%: 3.2 % (ref 0.0–7.0)
HCT: 34.3 % — ABNORMAL LOW (ref 38.4–49.9)
HEMOGLOBIN: 12 g/dL — AB (ref 13.0–17.1)
LYMPH#: 0.6 10*3/uL — AB (ref 0.9–3.3)
LYMPH%: 28.5 % (ref 14.0–49.0)
MCH: 32.4 pg (ref 27.2–33.4)
MCHC: 35 g/dL (ref 32.0–36.0)
MCV: 92.7 fL (ref 79.3–98.0)
MONO#: 0.3 10*3/uL (ref 0.1–0.9)
MONO%: 11.3 % (ref 0.0–14.0)
NEUT%: 56.5 % (ref 39.0–75.0)
NEUTROS ABS: 1.3 10*3/uL — AB (ref 1.5–6.5)
PLATELETS: 105 10*3/uL — AB (ref 140–400)
RBC: 3.7 10*6/uL — AB (ref 4.20–5.82)
RDW: 18.8 % — AB (ref 11.0–14.6)
WBC: 2.2 10*3/uL — AB (ref 4.0–10.3)

## 2016-01-04 LAB — FERRITIN: FERRITIN: 284 ng/mL (ref 22–316)

## 2016-01-04 MED ORDER — PROCHLORPERAZINE MALEATE 10 MG PO TABS
ORAL_TABLET | ORAL | Status: AC
  Filled 2016-01-04: qty 1

## 2016-01-04 MED ORDER — FLUOROURACIL CHEMO INJECTION 2.5 GM/50ML
500.0000 mg/m2 | Freq: Once | INTRAVENOUS | Status: AC
Start: 2016-01-04 — End: 2016-01-04
  Administered 2016-01-04: 950 mg via INTRAVENOUS
  Filled 2016-01-04: qty 19

## 2016-01-04 MED ORDER — SODIUM CHLORIDE 0.9 % IV SOLN
Freq: Once | INTRAVENOUS | Status: AC
  Administered 2016-01-04: 15:00:00 via INTRAVENOUS

## 2016-01-04 MED ORDER — LEUCOVORIN CALCIUM INJECTION 350 MG
500.0000 mg/m2 | Freq: Once | INTRAVENOUS | Status: AC
  Administered 2016-01-04: 970 mg via INTRAVENOUS
  Filled 2016-01-04: qty 48.5

## 2016-01-04 MED ORDER — PROCHLORPERAZINE MALEATE 10 MG PO TABS
10.0000 mg | ORAL_TABLET | Freq: Once | ORAL | Status: AC
  Administered 2016-01-04: 10 mg via ORAL

## 2016-01-04 NOTE — Telephone Encounter (Signed)
Gave patient avs report and appointments for April and May. Tx scheduled per instruction on 3/31 pof.

## 2016-01-04 NOTE — Patient Instructions (Signed)
[facility] Discharge Instructions for Patients Receiving Chemotherapy  Today you received the following chemotherapy agents: Leucovorin and 5FU.  To help prevent nausea and vomiting after your treatment, we encourage you to take your nausea medication: as directed.   If you develop nausea and vomiting that is not controlled by your nausea medication, call the clinic.   BELOW ARE SYMPTOMS THAT SHOULD BE REPORTED IMMEDIATELY:  *FEVER GREATER THAN 100.5 F  *CHILLS WITH OR WITHOUT FEVER  NAUSEA AND VOMITING THAT IS NOT CONTROLLED WITH YOUR NAUSEA MEDICATION  *UNUSUAL SHORTNESS OF BREATH  *UNUSUAL BRUISING OR BLEEDING  TENDERNESS IN MOUTH AND THROAT WITH OR WITHOUT PRESENCE OF ULCERS  *URINARY PROBLEMS  *BOWEL PROBLEMS  UNUSUAL RASH Items with * indicate a potential emergency and should be followed up as soon as possible.  Feel free to call the clinic you have any questions or concerns. The clinic phone number is [phone].  Please show the CHEMO ALERT CARD at check-in to the Emergency Department and triage nurse.   

## 2016-01-04 NOTE — Progress Notes (Addendum)
  [place] OFFICE PROGRESS NOTE   Diagnosis:  Gastric cancer  INTERVAL HISTORY:   James [name] returns as scheduled. He completed another treatment with weekly 5-FU/leucovorin 12/28/2015. He denies nausea/vomiting. No mouth sores. He has intermittent loose stools. No hand or foot pain or redness. He is tolerating a regular diet. The feeding tube was improved earlier today.  Objective:  Vital signs in last 24 hours:  Blood pressure 124/74, pulse 81, temperature 98.2 F (36.8 C), temperature source Oral, resp. rate 18, height 5\' 7"  (1.702 m), weight 162 lb 6.4 oz (73.664 kg), SpO2 98 %.    HEENT: No thrush or ulcers. Resp: Lungs clear bilaterally. Cardio: Regular rate and rhythm. GI: Abdomen soft. Tender over the left abdomen near the feeding tube site. No erythema at the previous tube site. No hepatomegaly. Vascular: No leg edema. Skin: Palms without erythema.    Lab Results:  Lab Results  Component Value Date   WBC 2.2* 01/04/2016   HGB 12.0* 01/04/2016   HCT 34.3* 01/04/2016   MCV 92.7 01/04/2016   PLT 105* 01/04/2016   NEUTROABS 1.3* 01/04/2016    Imaging:  No results found.  Medications: I have reviewed the patient's current medications.  Assessment/Plan: 1. Gastric cancer-adenocarcinoma of the gastric fundus/cardia  Staging CT scans 06/07/2015 with no evidence of distant metastatic disease, tiny nonspecific pulmonary nodules and liver lesions-likely benign  Subtotal gastrectomy and placement of a jejunostomy feeding tube 07/18/2015 for a T2,N0 tumor with negative surgical margins  Initiation of adjuvant 5-FU/leucovorin 08/28/2015, status post 3 weekly treatments completed 09/13/2015  Initiation of radiation and concurrent Xeloda 10/29/2015  11/26/2015 Xeloda dose reduced to 1000 mg twice daily due to progressive thrombocytopenia  Radiation completed 12/06/2015  5-FU/leucovorin resumed 12/21/2015   2. History of microcytic  anemia-likely iron deficiency anemia secondary to #1  3. Possible Vitamin B-12 deficiency, based on high methylmalonic acid level -status post vitamin B-12 replacement  4. 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 Carlean Purl 11/01/2015 and 12/10/2015  7. Mild neutropenia/thrombocytopenia secondary to chemotherapy. Progressive thrombocytopenia 11/26/2015. Xeloda dose reduced to 1000 mg twice daily. Counts stable to improved 01/04/2016.  8.Erythema/tenderness at the jejunostomy feeding tube site 09/20/2015, improved  9.    Feeding tube removed 01/04/2016   Disposition: James [name] appears stable. He is completing a 4 week cycle of 5-FU/leucovorin. Plan to proceed with week 3 today as scheduled. He will return on 01/11/2016 to complete this cycle. He will be off of treatment the following week and will be out of town on 01/25/2016. He will return to begin another 4 week cycle on 02/01/2016. We will see him in follow-up prior to proceeding with treatment that day. He will contact the office in the interim with any problems.  Patient seen with Dr. Benay Spice.    Ned Card ANP/GNP-BC   01/04/2016  1:52 PM  This was a shared visit with Ned Card. James [name] is tolerating the adjuvant 5-FU/leucovorin well. He agrees to complete another cycle of adjuvant chemotherapy beginning 02/01/2016.  Julieanne Manson, M.D.

## 2016-01-04 NOTE — Progress Notes (Signed)
Okay to treat today with abnormal lab values per Ned Card, NP.

## 2016-01-05 LAB — VITAMIN B12: VITAMIN B 12: 199 pg/mL — AB (ref 211–946)

## 2016-01-07 ENCOUNTER — Telehealth: Payer: Self-pay | Admitting: *Deleted

## 2016-01-07 DIAGNOSIS — E538 Deficiency of other specified B group vitamins: Secondary | ICD-10-CM

## 2016-01-07 NOTE — Telephone Encounter (Signed)
-----   Message from [facility], MD sent at 01/05/2016  7:23 AM EDT ----- Please call patient, vitamin b12 is low, start 1000 micrograms po daily,  Repeat b12 level 4/28

## 2016-01-07 NOTE — Telephone Encounter (Signed)
Spoke with pt's wife: Instructions given to have pt begin B12 1,000 micrograms daily. Will repeat lab next visit. She voiced understanding, wrote instructions down.

## 2016-01-08 DIAGNOSIS — C439 Malignant melanoma of skin, unspecified: Secondary | ICD-10-CM | POA: Insufficient documentation

## 2016-01-08 DIAGNOSIS — D224 Melanocytic nevi of scalp and neck: Secondary | ICD-10-CM | POA: Diagnosis not present

## 2016-01-08 DIAGNOSIS — Z85828 Personal history of other malignant neoplasm of skin: Secondary | ICD-10-CM | POA: Diagnosis not present

## 2016-01-08 DIAGNOSIS — D2262 Melanocytic nevi of left upper limb, including shoulder: Secondary | ICD-10-CM | POA: Diagnosis not present

## 2016-01-08 DIAGNOSIS — L814 Other melanin hyperpigmentation: Secondary | ICD-10-CM | POA: Diagnosis not present

## 2016-01-08 DIAGNOSIS — L821 Other seborrheic keratosis: Secondary | ICD-10-CM | POA: Diagnosis not present

## 2016-01-08 DIAGNOSIS — Z86006 Personal history of melanoma in-situ: Secondary | ICD-10-CM | POA: Insufficient documentation

## 2016-01-08 DIAGNOSIS — D485 Neoplasm of uncertain behavior of skin: Secondary | ICD-10-CM | POA: Diagnosis not present

## 2016-01-08 DIAGNOSIS — C4359 Malignant melanoma of other part of trunk: Secondary | ICD-10-CM | POA: Diagnosis not present

## 2016-01-11 ENCOUNTER — Other Ambulatory Visit: Payer: Self-pay | Admitting: *Deleted

## 2016-01-11 ENCOUNTER — Other Ambulatory Visit (HOSPITAL_BASED_OUTPATIENT_CLINIC_OR_DEPARTMENT_OTHER): Payer: Medicare Other

## 2016-01-11 ENCOUNTER — Encounter: Payer: Self-pay | Admitting: *Deleted

## 2016-01-11 ENCOUNTER — Ambulatory Visit (HOSPITAL_BASED_OUTPATIENT_CLINIC_OR_DEPARTMENT_OTHER): Payer: Medicare Other

## 2016-01-11 VITALS — BP 126/78 | HR 69 | Temp 97.9°F | Resp 18

## 2016-01-11 DIAGNOSIS — Z5111 Encounter for antineoplastic chemotherapy: Secondary | ICD-10-CM

## 2016-01-11 DIAGNOSIS — C165 Malignant neoplasm of lesser curvature of stomach, unspecified: Secondary | ICD-10-CM

## 2016-01-11 DIAGNOSIS — C161 Malignant neoplasm of fundus of stomach: Secondary | ICD-10-CM | POA: Diagnosis not present

## 2016-01-11 LAB — CBC WITH DIFFERENTIAL/PLATELET
BASO%: 0.3 % (ref 0.0–2.0)
Basophils Absolute: 0 10*3/uL (ref 0.0–0.1)
EOS ABS: 0.1 10*3/uL (ref 0.0–0.5)
EOS%: 3.1 % (ref 0.0–7.0)
HCT: 27.6 % — ABNORMAL LOW (ref 38.4–49.9)
HEMOGLOBIN: 9.9 g/dL — AB (ref 13.0–17.1)
LYMPH#: 0.7 10*3/uL — AB (ref 0.9–3.3)
LYMPH%: 25.7 % (ref 14.0–49.0)
MCH: 33 pg (ref 27.2–33.4)
MCHC: 35.9 g/dL (ref 32.0–36.0)
MCV: 92 fL (ref 79.3–98.0)
MONO#: 0.5 10*3/uL (ref 0.1–0.9)
MONO%: 17.7 % — AB (ref 0.0–14.0)
NEUT%: 53.2 % (ref 39.0–75.0)
NEUTROS ABS: 1.5 10*3/uL (ref 1.5–6.5)
NRBC: 0 % (ref 0–0)
PLATELETS: 85 10*3/uL — AB (ref 140–400)
RBC: 3 10*6/uL — AB (ref 4.20–5.82)
RDW: 19.3 % — ABNORMAL HIGH (ref 11.0–14.6)
WBC: 2.9 10*3/uL — ABNORMAL LOW (ref 4.0–10.3)

## 2016-01-11 LAB — COMPREHENSIVE METABOLIC PANEL
ANION GAP: 8 meq/L (ref 3–11)
AST: 17 U/L (ref 5–34)
Albumin: 3.5 g/dL (ref 3.5–5.0)
Alkaline Phosphatase: 62 U/L (ref 40–150)
BILIRUBIN TOTAL: 0.81 mg/dL (ref 0.20–1.20)
BUN: 9.8 mg/dL (ref 7.0–26.0)
CO2: 24 meq/L (ref 22–29)
CREATININE: 0.7 mg/dL (ref 0.7–1.3)
Calcium: 9.1 mg/dL (ref 8.4–10.4)
Chloride: 108 mEq/L (ref 98–109)
EGFR: >90 mL/min/{1.73_m2} (ref >90)
GLUCOSE: 98 mg/dL (ref 70–140)
Potassium: 3.2 mEq/L — ABNORMAL LOW (ref 3.5–5.1)
Sodium: 140 mEq/L (ref 136–145)
TOTAL PROTEIN: 6.5 g/dL (ref 6.4–8.3)

## 2016-01-11 MED ORDER — LEUCOVORIN CALCIUM INJECTION 350 MG
500.0000 mg/m2 | Freq: Once | INTRAVENOUS | Status: AC
  Administered 2016-01-11: 970 mg via INTRAVENOUS
  Filled 2016-01-11: qty 48.5

## 2016-01-11 MED ORDER — FLUOROURACIL CHEMO INJECTION 2.5 GM/50ML
500.0000 mg/m2 | Freq: Once | INTRAVENOUS | Status: AC
  Administered 2016-01-11: 950 mg via INTRAVENOUS
  Filled 2016-01-11: qty 19

## 2016-01-11 MED ORDER — PROCHLORPERAZINE MALEATE 10 MG PO TABS
ORAL_TABLET | ORAL | Status: AC
  Filled 2016-01-11: qty 1

## 2016-01-11 MED ORDER — SODIUM CHLORIDE 0.9 % IV SOLN
Freq: Once | INTRAVENOUS | Status: AC
  Administered 2016-01-11: 12:00:00 via INTRAVENOUS

## 2016-01-11 MED ORDER — PROCHLORPERAZINE MALEATE 10 MG PO TABS
10.0000 mg | ORAL_TABLET | Freq: Once | ORAL | Status: AC
  Administered 2016-01-11: 10 mg via ORAL

## 2016-01-11 MED ORDER — POTASSIUM CHLORIDE CRYS ER 20 MEQ PO TBCR: 20.0000 meq | EXTENDED_RELEASE_TABLET | Freq: Once | ORAL | Status: DC

## 2016-01-11 NOTE — Patient Instructions (Addendum)
[facility] Discharge Instructions for Patients Receiving Chemotherapy  Today you received the following chemotherapy agents: Leucovorin and 5 FU  To help prevent nausea and vomiting after your treatment, we encourage you to take your nausea medication as directed.   If you develop nausea and vomiting that is not controlled by your nausea medication, call the clinic.   BELOW ARE SYMPTOMS THAT SHOULD BE REPORTED IMMEDIATELY:  *FEVER GREATER THAN 100.5 F  *CHILLS WITH OR WITHOUT FEVER  NAUSEA AND VOMITING THAT IS NOT CONTROLLED WITH YOUR NAUSEA MEDICATION  *UNUSUAL SHORTNESS OF BREATH  *UNUSUAL BRUISING OR BLEEDING  TENDERNESS IN MOUTH AND THROAT WITH OR WITHOUT PRESENCE OF ULCERS  *URINARY PROBLEMS  *BOWEL PROBLEMS  UNUSUAL RASH Items with * indicate a potential emergency and should be followed up as soon as possible.  Feel free to call the clinic you have any questions or concerns. The clinic phone number is [phone].  Please show the [facility] at check-in to the Emergency Department and triage nurse.  Hypokalemia Hypokalemia means that the amount of potassium in the blood is lower than normal.Potassium is a chemical, called an electrolyte, that helps regulate the amount of fluid in the body. It also stimulates muscle contraction and helps nerves function properly.Most of the body's potassium is inside of cells, and only a very small amount is in the blood. Because the amount in the blood is so small, minor changes can be life-threatening. CAUSES  Antibiotics.  Diarrhea or vomiting.  Using laxatives too much, which can cause diarrhea.  Chronic kidney disease.  Water pills (diuretics).  Eating disorders (bulimia).  Low magnesium level.  Sweating a lot. SIGNS AND SYMPTOMS  Weakness.  Constipation.  Fatigue.  Muscle cramps.  Mental confusion.  Skipped heartbeats or irregular heartbeat (palpitations).  Tingling or  numbness. DIAGNOSIS  Your health care provider can diagnose hypokalemia with blood tests. In addition to checking your potassium level, your health care provider may also check other lab tests. TREATMENT Hypokalemia can be treated with potassium supplements taken by mouth or adjustments in your current medicines. If your potassium level is very low, you may need to get potassium through a vein (IV) and be monitored in the hospital. A diet high in potassium is also helpful. Foods high in potassium are:  Nuts, such as peanuts and pistachios.  Seeds, such as sunflower seeds and pumpkin seeds.  Peas, lentils, and lima beans.  Whole grain and bran cereals and breads.  Fresh fruit and vegetables, such as apricots, avocado, bananas, cantaloupe, kiwi, oranges, tomatoes, asparagus, and potatoes.  Orange and tomato juices.  Red meats.  Fruit yogurt. HOME CARE INSTRUCTIONS  Take all medicines as prescribed by your health care provider.  Maintain a healthy diet by including nutritious food, such as fruits, vegetables, nuts, whole grains, and lean meats.  If you are taking a laxative, be sure to follow the directions on the label. SEEK MEDICAL CARE IF:  Your weakness gets worse.  You feel your heart pounding or racing.  You are vomiting or having diarrhea.  You are diabetic and having trouble keeping your blood glucose in the normal range. SEEK IMMEDIATE MEDICAL CARE IF:  You have chest pain, shortness of breath, or dizziness.  You are vomiting or having diarrhea for more than 2 days.  You faint. MAKE SURE YOU:   Understand these instructions.  Will watch your condition.  Will get help right away if you are not doing well or get  worse.   This information is not intended to replace advice given to you by your health care provider. Make sure you discuss any questions you have with your health care provider.   Document Released: 09/22/2005 Document Revised: 10/13/2014 Document  Reviewed: 03/25/2013 Elsevier Interactive Patient Education [employer].

## 2016-01-11 NOTE — Progress Notes (Signed)
OK to treat with WBC-2.9 and platelet count of 85 per L. Marcello Moores NP.  1315-DrBenay Spice notified of K-3.2.  Order received for pt to receive K-DUR 20 meq daily and for pt to come back the end of next week for CMET/CBC per Dr. Benay Spice.  Pt instructed of information and verbalizes an understanding of all information.

## 2016-01-14 NOTE — Progress Notes (Signed)
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     Plan: The patient has completed radiation treatment. The patient will return to radiation oncology clinic for routine followup in one month. I advised them to call or return sooner if they have any questions or concerns related to their recovery or treatment.  ------------------------------------------------  Jodelle Gross, MD, PhD  This document serves as a record of services personally performed by Kyung Rudd, MD. It was created on his behalf by Derek Mound, a trained medical scribe. The creation of this record is based on the scribe's personal observations and the provider's statements to them. This document has been checked and approved by the attending provider.

## 2016-01-15 ENCOUNTER — Encounter: Payer: Self-pay | Admitting: Internal Medicine

## 2016-01-17 ENCOUNTER — Other Ambulatory Visit (HOSPITAL_BASED_OUTPATIENT_CLINIC_OR_DEPARTMENT_OTHER): Payer: Medicare Other

## 2016-01-17 ENCOUNTER — Telehealth: Payer: Self-pay | Admitting: *Deleted

## 2016-01-17 ENCOUNTER — Ambulatory Visit: Payer: Medicare Other | Admitting: Nutrition

## 2016-01-17 ENCOUNTER — Ambulatory Visit: Admission: RE | Admit: 2016-01-17 | Payer: Medicare Other | Source: Ambulatory Visit | Admitting: Radiation Oncology

## 2016-01-17 DIAGNOSIS — C165 Malignant neoplasm of lesser curvature of stomach, unspecified: Secondary | ICD-10-CM

## 2016-01-17 LAB — COMPREHENSIVE METABOLIC PANEL
ALBUMIN: 3.7 g/dL (ref 3.5–5.0)
ALK PHOS: 56 U/L (ref 40–150)
AST: 18 U/L (ref 5–34)
Anion Gap: 7 mEq/L (ref 3–11)
BUN: 11 mg/dL (ref 7.0–26.0)
CALCIUM: 9 mg/dL (ref 8.4–10.4)
CO2: 25 mEq/L (ref 22–29)
CREATININE: 0.7 mg/dL (ref 0.7–1.3)
Chloride: 108 mEq/L (ref 98–109)
EGFR: >90 mL/min/{1.73_m2} (ref >90)
Glucose: 123 mg/dl (ref 70–140)
POTASSIUM: 4.1 meq/L (ref 3.5–5.1)
Sodium: 140 mEq/L (ref 136–145)
Total Bilirubin: 0.93 mg/dL (ref 0.20–1.20)
Total Protein: 6.6 g/dL (ref 6.4–8.3)

## 2016-01-17 LAB — CBC & DIFF AND RETIC
BASO%: 0.4 % (ref 0.0–2.0)
Basophils Absolute: 0 10*3/uL (ref 0.0–0.1)
EOS ABS: 0.1 10*3/uL (ref 0.0–0.5)
EOS%: 5.6 % (ref 0.0–7.0)
HCT: 24.9 % — ABNORMAL LOW (ref 38.4–49.9)
HEMOGLOBIN: 8.8 g/dL — AB (ref 13.0–17.1)
Immature Retic Fract: 2.4 % — ABNORMAL LOW (ref 3.00–10.60)
LYMPH%: 22.7 % (ref 14.0–49.0)
MCH: 33.2 pg (ref 27.2–33.4)
MCHC: 35.3 g/dL (ref 32.0–36.0)
MCV: 94 fL (ref 79.3–98.0)
MONO#: 0.3 10*3/uL (ref 0.1–0.9)
MONO%: 13.7 % (ref 0.0–14.0)
NEUT#: 1.3 10*3/uL — ABNORMAL LOW (ref 1.5–6.5)
NEUT%: 57.6 % (ref 39.0–75.0)
Platelets: 75 10*3/uL — ABNORMAL LOW (ref 140–400)
RBC: 2.65 10*6/uL — ABNORMAL LOW (ref 4.20–5.82)
RDW: 19.9 % — AB (ref 11.0–14.6)
RETIC %: 1.44 % (ref 0.80–1.80)
Retic Ct Abs: 38.16 10*3/uL (ref 34.80–93.90)
WBC: 2.3 10*3/uL — ABNORMAL LOW (ref 4.0–10.3)
lymph#: 0.5 10*3/uL — ABNORMAL LOW (ref 0.9–3.3)

## 2016-01-17 NOTE — Progress Notes (Signed)
I received a phone call from patient's wife requesting assistance with increasing patient's calories and protein. Patient is receiving treatment for gastric cancer. Patient has had his jejunostomy feeding tube removed on March 31. Weight was documented as 162 pounds. Patient does report early satiety secondary to post subtotal gastrectomy.  Estimated nutrition needs 2200-2500 calories, 90-110 grams protein, 2.5 L fluid.  Nutrition diagnosis:  Food and nutrition related knowledge deficit and unintended weight loss.  Both continue.  Intervention: Educated patient's wife to encourage patient to consume small frequent meals and snacks 3 times daily. Patient should focus on increasing overall calories first and then adding increased protein. Reviewed importance of patient consuming a minimum of one oral nutrition supplement daily. Reviewed appropriate oral nutrition supplements and have provided samples for patient to pick up when he comes in to the [facility] Also provided some recipes for high-calorie shakes that patient may tolerate Questions were answered.  Teach back method used.  Monitoring, evaluation, goals: Patient will work to increase calories and protein to promote weight maintenance.  Next visit: To be scheduled as needed.  **Disclaimer: This note was dictated with voice recognition software. Similar sounding words can inadvertently be transcribed and this note may contain transcription errors which may not have been corrected upon publication of note.**

## 2016-01-17 NOTE — Telephone Encounter (Signed)
-----   Message from Ladell Pier, MD sent at 01/17/2016  5:18 PM EDT ----- Please call patient, neutrophils and platelets are still low, call for fever or bleeding, f/u as scheduled 4/28

## 2016-01-17 NOTE — Telephone Encounter (Signed)
Per Dr. Benay Spice, message left on pt.'s private cell phone to inform him that his neutrophils and platelets are still low and to call for any fever or bleeding.  F/u as scheduled on 02/01/16.  Pt instructed to call [facility] with any questions or concerns.

## 2016-01-18 ENCOUNTER — Encounter: Payer: Self-pay | Admitting: Radiation Oncology

## 2016-01-18 ENCOUNTER — Ambulatory Visit
Admission: RE | Admit: 2016-01-18 | Discharge: 2016-01-18 | Disposition: A | Discharge status: Home / Self Care | Payer: Medicare Other | Source: Ambulatory Visit | Attending: Radiation Oncology | Admitting: Radiation Oncology

## 2016-01-18 VITALS — BP 100/60 | HR 70 | Temp 98.1°F | Ht 67.0 in | Wt 163.0 lb

## 2016-01-18 DIAGNOSIS — C160 Malignant neoplasm of cardia: Secondary | ICD-10-CM

## 2016-01-18 NOTE — Progress Notes (Signed)
Radiation Oncology         [phone]) [phone] ________________________________  Name: HELDER CRISAFULLI MRN: [id number]  Date: 01/18/2016  DOB: 09-18-1946  Follow-Up Visit Note  CC: Pincus Sanes, MD  Ladene Artist, MD  Diagnosis:   Stage IB (T2, N0, M0) adenocarcinoma of the gastric fundus/cardia  Interval Since Last Radiation:  1 Month  11/02/15 - 12/06/15: Stomach treated to 45 Gy in 25 fractions. Stomach boost treated to 5.4 Gy in 3 fractions  Narrative:  The patient returns today for routine follow-up.  He is continuing currently on his 5-FU and leucovorin. He did undergo removal of his jejunostomy feeding tube on 01/04/2016.     On review of systems, the patient reports that he is doing pretty well overall and is experiencing improvement at home in terms of his abilities, and appetite is continuing to progress. He has met with Haydee Monica, and has been encouraged to try and increases calorie intake to 2200 cal per day. He is not experiencing any issues with nausea or vomiting. 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 He has not had any shortness of breath or chest pain, fevers or chills. No other complaints or verbalized.              ALLERGIES:  is allergic to codeine.  Meds: Current Outpatient Prescriptions  Medication Sig Dispense Refill  . cyanocobalamin 1000 MCG tablet Take 1,000 mcg by mouth daily.    . mirtazapine (REMERON) 15 MG tablet Take 15 mg by mouth at bedtime.    [name] omeprazole (PRILOSEC) 40 MG capsule Take 1 capsule (40 mg total) by mouth 2 (two) times daily before a meal. 30 MINS BEFORE BREAKFAST/SUPPER 60 capsule 2  . potassium chloride SA (K-DUR,KLOR-CON) 20 MEQ tablet Take 1 tablet (20 mEq total) by mouth once. 30 tablet 0  . pravastatin (PRAVACHOL) 40 MG tablet Take 1 tablet (40 mg total) by mouth daily. 90 tablet 3  . PRESCRIPTION MEDICATION Oncology treatment [facility]    .  ferrous sulfate 325 (65 FE) MG EC tablet Take 1 tablet (325 mg total) by mouth daily. (Patient not taking: Reported on 01/18/2016) 30 tablet 3   No current facility-administered medications for this encounter.    Physical Findings:  height is 5\' 7"  (1.702 m) and weight is 163 lb (73.936 kg). His temperature is 98.1 F (36.7 C). His blood pressure is 100/60 and his pulse is 70. .   Pain scale 0/10 In general this is a thin appearing Caucasian male in no acute distress. He is alert and oriented x4 and appropriate throughout the examination. HEENT reveals that the patient is normocephalic, atraumatic. EOMs are intact. PERRLA. Skin is intact along the anterior abdominal wall. A small area consistent with previous biopsy from his dermatologist was obtained in the left lower quadrant. His feeding tube site is assessed, and is well epithelialized. There is mild hyperpigmentation along the epigastric region, without evidence of desquamation.  Lab Findings: Lab Results  Component Value Date   WBC 2.3* 01/17/2016   HGB 8.8* 01/17/2016   HCT 24.9* 01/17/2016   MCV 94.0 01/17/2016   PLT 75* 01/17/2016     Radiographic Findings: No results found.  Impression/Plan: 1. Stage IB, T2 N0 M0 adenocarcinoma of the gastric fundus/cardia. Patient continues to do well and will finish his remaining chemotherapy under the care of Dr. 01/19/2016 and Truett Perna,  NP. We are pleased with his progress thus far, and would like to see him back in 6 months time for continued evaluation. 2. Protein calorie malnutrition and compared to malignancy. The patient has been working with Ernestene Kiel in nutrition, and has been successful in increasing his oral intake. We will continue to follow this closely as well. 3. New diagnosis of melanoma of the skin. The patient will be undergoing surgery with his dermatologist in the near future. We will continue to follow this expectantly as well.    Carola Rhine, PAC

## 2016-01-18 NOTE — Progress Notes (Signed)
James [name] denies any pain.  No longer has a PEG tube and reports that he is not eating well. Eating small frequent meals.  Goal daily of 2200 calories  Has gained minimal weight.    BP 100/60 mmHg  Pulse 70  Temp(Src) 98.1 F (36.7 C)  Ht 5\' 7"  (1.702 m)  Wt 163 lb (73.936 kg)  BMI 25.52 kg/m2   Wt Readings from Last 3 Encounters:  01/18/16 163 lb (73.936 kg)  01/04/16 162 lb 6.4 oz (73.664 kg)  12/21/15 162 lb (73.483 kg)

## 2016-01-21 ENCOUNTER — Telehealth: Payer: Self-pay | Admitting: *Deleted

## 2016-01-21 DIAGNOSIS — C4359 Malignant melanoma of other part of trunk: Secondary | ICD-10-CM | POA: Diagnosis not present

## 2016-01-21 DIAGNOSIS — C161 Malignant neoplasm of fundus of stomach: Secondary | ICD-10-CM | POA: Diagnosis not present

## 2016-01-21 NOTE — Telephone Encounter (Signed)
CALLED PATIENT TO INFORM OF FU WITH ALISON PERKINS ON 07/22/16 @ 1:30 PM, SPOKE WITH PATIENT AND HE IS AWARE OF THIS APPT.

## 2016-01-27 ENCOUNTER — Other Ambulatory Visit: Payer: Self-pay | Admitting: Oncology

## 2016-02-01 ENCOUNTER — Telehealth: Payer: Self-pay | Admitting: *Deleted

## 2016-02-01 ENCOUNTER — Ambulatory Visit (HOSPITAL_BASED_OUTPATIENT_CLINIC_OR_DEPARTMENT_OTHER): Payer: Medicare Other | Admitting: Oncology

## 2016-02-01 ENCOUNTER — Telehealth: Payer: Self-pay | Admitting: Nurse Practitioner

## 2016-02-01 ENCOUNTER — Ambulatory Visit (HOSPITAL_BASED_OUTPATIENT_CLINIC_OR_DEPARTMENT_OTHER): Payer: Medicare Other

## 2016-02-01 ENCOUNTER — Other Ambulatory Visit (HOSPITAL_BASED_OUTPATIENT_CLINIC_OR_DEPARTMENT_OTHER): Payer: Medicare Other

## 2016-02-01 VITALS — BP 114/62 | HR 76 | Temp 98.2°F | Resp 18 | Ht 67.0 in | Wt 162.5 lb

## 2016-02-01 DIAGNOSIS — C165 Malignant neoplasm of lesser curvature of stomach, unspecified: Secondary | ICD-10-CM

## 2016-02-01 DIAGNOSIS — C161 Malignant neoplasm of fundus of stomach: Secondary | ICD-10-CM | POA: Diagnosis not present

## 2016-02-01 DIAGNOSIS — E538 Deficiency of other specified B group vitamins: Secondary | ICD-10-CM

## 2016-02-01 DIAGNOSIS — C61 Malignant neoplasm of prostate: Secondary | ICD-10-CM

## 2016-02-01 DIAGNOSIS — Z5111 Encounter for antineoplastic chemotherapy: Secondary | ICD-10-CM

## 2016-02-01 DIAGNOSIS — D701 Agranulocytosis secondary to cancer chemotherapy: Secondary | ICD-10-CM | POA: Diagnosis not present

## 2016-02-01 HISTORY — PX: MELANOMA EXCISION: SHX5266

## 2016-02-01 LAB — CBC WITH DIFFERENTIAL/PLATELET
BASO%: 0.7 % (ref 0.0–2.0)
BASOS ABS: 0 10*3/uL (ref 0.0–0.1)
EOS ABS: 0.1 10*3/uL (ref 0.0–0.5)
EOS%: 3.9 % (ref 0.0–7.0)
HCT: 28.2 % — ABNORMAL LOW (ref 38.4–49.9)
HEMOGLOBIN: 9.6 g/dL — AB (ref 13.0–17.1)
LYMPH#: 0.9 10*3/uL (ref 0.9–3.3)
LYMPH%: 31.1 % (ref 14.0–49.0)
MCH: 35.2 pg — ABNORMAL HIGH (ref 27.2–33.4)
MCHC: 34 g/dL (ref 32.0–36.0)
MCV: 103.3 fL — AB (ref 79.3–98.0)
MONO#: 0.4 10*3/uL (ref 0.1–0.9)
MONO%: 14.6 % — AB (ref 0.0–14.0)
NEUT%: 49.7 % (ref 39.0–75.0)
NEUTROS ABS: 1.4 10*3/uL — AB (ref 1.5–6.5)
NRBC: 0 % (ref 0–0)
PLATELETS: 85 10*3/uL — AB (ref 140–400)
RBC: 2.73 10*6/uL — AB (ref 4.20–5.82)
RDW: 23.7 % — AB (ref 11.0–14.6)
WBC: 2.8 10*3/uL — AB (ref 4.0–10.3)

## 2016-02-01 LAB — COMPREHENSIVE METABOLIC PANEL
ALBUMIN: 3.9 g/dL (ref 3.5–5.0)
ALK PHOS: 70 U/L (ref 40–150)
ALT: 10 U/L (ref 0–55)
AST: 19 U/L (ref 5–34)
Anion Gap: 6 mEq/L (ref 3–11)
BILIRUBIN TOTAL: 1.03 mg/dL (ref 0.20–1.20)
BUN: 11.9 mg/dL (ref 7.0–26.0)
CO2: 26 meq/L (ref 22–29)
CREATININE: 0.7 mg/dL (ref 0.7–1.3)
Calcium: 9.3 mg/dL (ref 8.4–10.4)
Chloride: 106 mEq/L (ref 98–109)
EGFR: >90 mL/min/{1.73_m2} (ref >90)
GLUCOSE: 83 mg/dL (ref 70–140)
Potassium: 4.4 mEq/L (ref 3.5–5.1)
SODIUM: 139 meq/L (ref 136–145)
Total Protein: 6.9 g/dL (ref 6.4–8.3)

## 2016-02-01 MED ORDER — FLUOROURACIL CHEMO INJECTION 2.5 GM/50ML
400.0000 mg/m2 | Freq: Once | INTRAVENOUS | Status: AC
  Administered 2016-02-01: 800 mg via INTRAVENOUS
  Filled 2016-02-01: qty 16

## 2016-02-01 MED ORDER — PROCHLORPERAZINE MALEATE 10 MG PO TABS
ORAL_TABLET | ORAL | Status: AC
  Filled 2016-02-01: qty 1

## 2016-02-01 MED ORDER — LEUCOVORIN CALCIUM INJECTION 350 MG
400.0000 mg/m2 | Freq: Once | INTRAVENOUS | Status: AC
  Administered 2016-02-01: 776 mg via INTRAVENOUS
  Filled 2016-02-01: qty 38.8

## 2016-02-01 MED ORDER — SODIUM CHLORIDE 0.9 % IV SOLN
Freq: Once | INTRAVENOUS | Status: AC
  Administered 2016-02-01: 11:00:00 via INTRAVENOUS

## 2016-02-01 MED ORDER — PROCHLORPERAZINE MALEATE 10 MG PO TABS
10.0000 mg | ORAL_TABLET | Freq: Once | ORAL | Status: AC
Start: 2016-02-01 — End: 2016-02-01
  Administered 2016-02-01: 10 mg via ORAL

## 2016-02-01 NOTE — Progress Notes (Signed)
OK to treat with ANC-1.4 and platelets of 85 per Dr. Benay Spice.  Dose reduction today per Dr. Benay Spice.

## 2016-02-01 NOTE — Telephone Encounter (Signed)
Per staff message and POF I have scheduled appts. Advised scheduler of appts. JMW  

## 2016-02-01 NOTE — Progress Notes (Signed)
  [facility] OFFICE PROGRESS NOTE   Diagnosis: Gastric cancer  INTERVAL HISTORY:   James [name] returns as scheduled. He feels well. He was last treated with 5-FU/leucovorin 01/31/2016. No nausea or diarrhea. He is eating a regular diet. He takes vitamin B-12. He reports a melanoma was removed from the left mid abdomen on 01/08/2016. He is scheduled for reexcision surgery.  Objective:  Vital signs in last 24 hours:  Blood pressure 114/62, pulse 76, temperature 98.2 F (36.8 C), temperature source Oral, resp. rate 18, height 5\' 7"  (1.702 m), weight 162 lb 8 oz (73.71 kg), SpO2 100 %.    HEENT: No thrush or ulcers Resp: Lungs clear bilaterally  Cardio: Regular rate and rhythm GI: No hepatomegaly, nontender, no mass Vascular: No leg edema  Skin: Left mid abdominal wall biopsy site    Lab Results:  Lab Results  Component Value Date   WBC 2.8* 02/01/2016   HGB 9.6* 02/01/2016   HCT 28.2* 02/01/2016   MCV 103.3* 02/01/2016   PLT 85* 02/01/2016   NEUTROABS 1.4* 02/01/2016     Medications: I have reviewed the patient's current medications.  Assessment/Plan: 1. Gastric cancer-adenocarcinoma of the gastric fundus/cardia  Staging CT scans 06/07/2015 with no evidence of distant metastatic disease, tiny nonspecific pulmonary nodules and liver lesions-likely benign  Subtotal gastrectomy and placement of a jejunostomy feeding tube 07/18/2015 for a T2,N0 tumor with negative surgical margins  Initiation of adjuvant 5-FU/leucovorin 08/28/2015, status post 3 weekly treatments completed 09/13/2015  Initiation of radiation and concurrent Xeloda 10/29/2015  11/26/2015 Xeloda dose reduced to 1000 mg twice daily due to progressive thrombocytopenia  Radiation completed 12/06/2015  5-FU/leucovorin resumed 12/21/2015   2. History of microcytic anemia-likely iron deficiency anemia secondary to #1  3.  Vitamin B-12 deficiency-Low-level confirmed 01/04/2016, maintained  on oral vitamin B-12 replacement  4. 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 Carlean Purl 11/01/2015 and 12/10/2015  7. Mild neutropenia/thrombocytopenia secondary to chemotherapy. Progressive thrombocytopenia 11/26/2015. Xeloda dose reduced to 1000 mg twice daily. Counts stable to improved 01/04/2016.  Persistent pancytopenia-secondary to chemotherapy versus vitamin B 12 deficiency  8.Erythema/tenderness at the jejunostomy feeding tube site 09/20/2015, improved  9. Feeding tube removed 01/04/2016   Disposition:  James [name] appears well. He has persistent pancytopenia. The right B-12 level was low last month and he is taking oral B-12 replacement. We repeated a B-12 level today. We will add parenteral B-12 if the level returns low.  The plan is to proceed with 4 additional weeks of 5-FU/leucovorin beginning today. He will return for an office visit prior to the final plan treatment on 02/21/2016.  Betsy Coder, MD  02/01/2016  10:20 AM

## 2016-02-01 NOTE — Patient Instructions (Signed)
[facility] Discharge Instructions for Patients Receiving Chemotherapy  Today you received the following chemotherapy agents: adrucil, Leucovorin.  To help prevent nausea and vomiting after your treatment, we encourage you to take your nausea medication: as prescribed.   If you develop nausea and vomiting that is not controlled by your nausea medication, call the clinic.   BELOW ARE SYMPTOMS THAT SHOULD BE REPORTED IMMEDIATELY:  *FEVER GREATER THAN 100.5 F  *CHILLS WITH OR WITHOUT FEVER  NAUSEA AND VOMITING THAT IS NOT CONTROLLED WITH YOUR NAUSEA MEDICATION  *UNUSUAL SHORTNESS OF BREATH  *UNUSUAL BRUISING OR BLEEDING  TENDERNESS IN MOUTH AND THROAT WITH OR WITHOUT PRESENCE OF ULCERS  *URINARY PROBLEMS  *BOWEL PROBLEMS  UNUSUAL RASH Items with * indicate a potential emergency and should be followed up as soon as possible.  Feel free to call the clinic you have any questions or concerns. The clinic phone number is [phone].  Please show the [facility] at check-in to the Emergency Department and triage nurse.

## 2016-02-01 NOTE — Telephone Encounter (Signed)
per pof to sch pt appt-sent MW emailto sch trmt-pt to getupdated copy b4 leaving** °

## 2016-02-02 LAB — VITAMIN B12: VITAMIN B 12: 367 pg/mL (ref 211–946)

## 2016-02-04 ENCOUNTER — Encounter (HOSPITAL_COMMUNITY): Payer: Self-pay | Admitting: *Deleted

## 2016-02-04 ENCOUNTER — Telehealth: Payer: Self-pay | Admitting: Internal Medicine

## 2016-02-04 DIAGNOSIS — Z85828 Personal history of other malignant neoplasm of skin: Secondary | ICD-10-CM | POA: Diagnosis not present

## 2016-02-04 DIAGNOSIS — L905 Scar conditions and fibrosis of skin: Secondary | ICD-10-CM | POA: Diagnosis not present

## 2016-02-04 DIAGNOSIS — K222 Esophageal obstruction: Secondary | ICD-10-CM

## 2016-02-04 DIAGNOSIS — Z85820 Personal history of malignant melanoma of skin: Secondary | ICD-10-CM | POA: Diagnosis not present

## 2016-02-04 DIAGNOSIS — C4359 Malignant melanoma of other part of trunk: Secondary | ICD-10-CM | POA: Diagnosis not present

## 2016-02-04 NOTE — Telephone Encounter (Signed)
See if I can do him 130 Wed 5/3 at [facility], have fluoro available

## 2016-02-04 NOTE — Telephone Encounter (Signed)
Patient notified He is scheduled for 02/06/16 1:30

## 2016-02-04 NOTE — Telephone Encounter (Signed)
Patient reports that he is having the beginning of his dysphagia again.  "Close enough to need it stretched again".  He has completed his radiation treatment.  Starting to have pain with meals.

## 2016-02-05 NOTE — Anesthesia Preprocedure Evaluation (Addendum)
Anesthesia Evaluation  Patient identified by MRN, date of birth, ID band Patient awake    Reviewed: Allergy & Precautions, H&P , NPO status , Patient's Chart, lab work & pertinent test results  History of Anesthesia Complications Negative for: history of anesthetic complications  Airway Mallampati: II  TM Distance: >3 FB Neck ROM: full    Dental no notable dental hx. (+) Dental Advisory Given, Caps,    Pulmonary asthma , former smoker (quit 1969),    Pulmonary exam normal breath sounds clear to auscultation       Cardiovascular Exercise Tolerance: Good (-) hypertension(-) angina(-) Past MI negative cardio ROS Normal cardiovascular exam+ Valvular Problems/Murmurs  Rhythm:regular Rate:Normal  Normal Stress 2008   Neuro/Psych Depression negative neurological ROS  negative psych ROS   GI/Hepatic Neg liver ROS, PUD, GERD  Medicated and Controlled,Stomach cancer,SP resection    Endo/Other  negative endocrine ROS  Renal/GU negative Renal ROS  negative genitourinary   Musculoskeletal negative musculoskeletal ROS (+)   Abdominal   Peds  Hematology  (+) Blood dyscrasia, anemia , 9/28   Anesthesia Other Findings   Reproductive/Obstetrics                            Anesthesia Physical Anesthesia Plan  ASA: III  Anesthesia Plan: MAC   Post-op Pain Management:    Induction: Intravenous  Airway Management Planned: Nasal Cannula  Additional Equipment:   Intra-op Plan:   Post-operative Plan:   Informed Consent: I have reviewed the patients History and Physical, chart, labs and discussed the procedure including the risks, benefits and alternatives for the proposed anesthesia with the patient or authorized representative who has indicated his/her understanding and acceptance.     Plan Discussed with:   Anesthesia Plan Comments:         Anesthesia Quick Evaluation

## 2016-02-06 ENCOUNTER — Telehealth: Payer: Self-pay

## 2016-02-06 ENCOUNTER — Ambulatory Visit (HOSPITAL_COMMUNITY): Payer: Medicare Other | Admitting: Anesthesiology

## 2016-02-06 ENCOUNTER — Ambulatory Visit (HOSPITAL_COMMUNITY)
Admission: RE | Admit: 2016-02-06 | Discharge: 2016-02-06 | Disposition: A | Discharge status: Home / Self Care | Payer: Medicare Other | Source: Ambulatory Visit | Attending: Internal Medicine | Admitting: Internal Medicine

## 2016-02-06 ENCOUNTER — Encounter (HOSPITAL_COMMUNITY): Payer: Self-pay | Admitting: *Deleted

## 2016-02-06 ENCOUNTER — Encounter (HOSPITAL_COMMUNITY)
Admission: RE | Disposition: A | Discharge status: Home / Self Care | Payer: Self-pay | Source: Ambulatory Visit | Attending: Internal Medicine

## 2016-02-06 ENCOUNTER — Encounter (HOSPITAL_COMMUNITY): Admission: RE | Payer: Self-pay | Source: Ambulatory Visit

## 2016-02-06 ENCOUNTER — Other Ambulatory Visit (INDEPENDENT_AMBULATORY_CARE_PROVIDER_SITE_OTHER): Payer: Medicare Other

## 2016-02-06 DIAGNOSIS — Z85828 Personal history of other malignant neoplasm of skin: Secondary | ICD-10-CM | POA: Diagnosis not present

## 2016-02-06 DIAGNOSIS — Z931 Gastrostomy status: Secondary | ICD-10-CM | POA: Diagnosis not present

## 2016-02-06 DIAGNOSIS — C61 Malignant neoplasm of prostate: Secondary | ICD-10-CM | POA: Diagnosis not present

## 2016-02-06 DIAGNOSIS — D649 Anemia, unspecified: Secondary | ICD-10-CM | POA: Diagnosis not present

## 2016-02-06 DIAGNOSIS — D72819 Decreased white blood cell count, unspecified: Secondary | ICD-10-CM | POA: Diagnosis not present

## 2016-02-06 DIAGNOSIS — I252 Old myocardial infarction: Secondary | ICD-10-CM | POA: Diagnosis not present

## 2016-02-06 DIAGNOSIS — Z923 Personal history of irradiation: Secondary | ICD-10-CM | POA: Diagnosis not present

## 2016-02-06 DIAGNOSIS — Z79899 Other long term (current) drug therapy: Secondary | ICD-10-CM | POA: Diagnosis not present

## 2016-02-06 DIAGNOSIS — Z85028 Personal history of other malignant neoplasm of stomach: Secondary | ICD-10-CM | POA: Diagnosis not present

## 2016-02-06 DIAGNOSIS — K219 Gastro-esophageal reflux disease without esophagitis: Secondary | ICD-10-CM | POA: Diagnosis not present

## 2016-02-06 DIAGNOSIS — Z87891 Personal history of nicotine dependence: Secondary | ICD-10-CM | POA: Diagnosis not present

## 2016-02-06 DIAGNOSIS — K222 Esophageal obstruction: Secondary | ICD-10-CM

## 2016-02-06 HISTORY — PX: ESOPHAGOGASTRODUODENOSCOPY (EGD) WITH PROPOFOL: SHX5813

## 2016-02-06 HISTORY — DX: Malignant melanoma of skin, unspecified: C43.9

## 2016-02-06 HISTORY — PX: SAVORY DILATION: SHX5439

## 2016-02-06 LAB — CBC WITH DIFFERENTIAL/PLATELET
Basophils Absolute: 0 10*3/uL (ref 0.0–0.1)
Basophils Relative: 0.3 % (ref 0.0–3.0)
Eosinophils Absolute: 0.1 10*3/uL (ref 0.0–0.7)
Eosinophils Relative: 4.2 % (ref 0.0–5.0)
HCT: 29.6 % — ABNORMAL LOW (ref 39.0–52.0)
HEMOGLOBIN: 10.2 g/dL — AB (ref 13.0–17.0)
LYMPHS PCT: 27.3 % (ref 12.0–46.0)
Lymphs Abs: 0.9 10*3/uL (ref 0.7–4.0)
MCHC: 34.5 g/dL (ref 30.0–36.0)
MCV: 105.2 fl — AB (ref 78.0–100.0)
MONOS PCT: 10.6 % (ref 3.0–12.0)
Monocytes Absolute: 0.3 10*3/uL (ref 0.1–1.0)
Neutro Abs: 1.9 10*3/uL (ref 1.4–7.7)
Neutrophils Relative %: 57.6 % (ref 43.0–77.0)
Platelets: 102 10*3/uL — ABNORMAL LOW (ref 150.0–400.0)
RBC: 2.82 Mil/uL — AB (ref 4.22–5.81)
RDW: 23.8 % — ABNORMAL HIGH (ref 11.5–15.5)
WBC: 3.2 10*3/uL — AB (ref 4.0–10.5)

## 2016-02-06 SURGERY — ESOPHAGOGASTRODUODENOSCOPY (EGD) WITH PROPOFOL
Anesthesia: Monitor Anesthesia Care

## 2016-02-06 MED ORDER — LIDOCAINE HCL (CARDIAC) 20 MG/ML IV SOLN
INTRAVENOUS | Status: DC | PRN
  Administered 2016-02-06: 100 mg via INTRAVENOUS

## 2016-02-06 MED ORDER — PROPOFOL 500 MG/50ML IV EMUL
INTRAVENOUS | Status: DC | PRN
  Administered 2016-02-06: 100 ug/kg/min via INTRAVENOUS

## 2016-02-06 MED ORDER — LACTATED RINGERS IV SOLN
INTRAVENOUS | Status: DC
  Administered 2016-02-06: 1000 mL via INTRAVENOUS

## 2016-02-06 MED ORDER — SODIUM CHLORIDE 0.9 % IV SOLN: INTRAVENOUS | Status: DC

## 2016-02-06 MED ORDER — PROPOFOL 10 MG/ML IV BOLUS
INTRAVENOUS | Status: DC | PRN
  Administered 2016-02-06 (×7): 20 mg via INTRAVENOUS

## 2016-02-06 MED ORDER — MEPERIDINE HCL 100 MG/ML IJ SOLN: 6.2500 mg | INTRAMUSCULAR | Status: DC | PRN

## 2016-02-06 MED ORDER — LIDOCAINE HCL (CARDIAC) 20 MG/ML IV SOLN
INTRAVENOUS | Status: AC
  Filled 2016-02-06: qty 5

## 2016-02-06 MED ORDER — ONDANSETRON HCL 4 MG/2ML IJ SOLN
INTRAMUSCULAR | Status: AC
  Filled 2016-02-06: qty 2

## 2016-02-06 MED ORDER — ONDANSETRON HCL 4 MG/2ML IJ SOLN
INTRAMUSCULAR | Status: DC | PRN
  Administered 2016-02-06: 4 mg via INTRAVENOUS

## 2016-02-06 MED ORDER — OMEPRAZOLE 40 MG PO CPDR: 40.0000 mg | DELAYED_RELEASE_CAPSULE | Freq: Every day | ORAL | Status: DC

## 2016-02-06 MED ORDER — PROPOFOL 10 MG/ML IV BOLUS
INTRAVENOUS | Status: AC
  Filled 2016-02-06: qty 60

## 2016-02-06 SURGICAL SUPPLY — 14 items

## 2016-02-06 NOTE — H&P (Signed)
[facility] History and Physical   Primary Care Physician:  Binnie Rail, MD   Reason for Procedure:   dilate esophageal stricture  Plan:    EGD, dilate stricture - The risks and benefits as well as alternatives of endoscopic procedure(s) have been discussed and reviewed. All questions answered. The patient agrees to proceed.   HPI: James [name] is a 70 y.o. 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 Having more dysphagia. Last dilated to 16.6 mm 2 months ago   Past Medical History  Diagnosis Date  . Abnormal EKG     NS ST-T EKG Changes, (-) Nuclear Stress Test 03/2006  . Hyperlipemia   . Duodenal ulcer 1972    transfusion required, 1 unit packed cells  . Hx of basal cell carcinoma   . S/P radiation therapy 11/29/2014 through 01/23/2015     Prostate 7800 cGy in 40 sessions, seminal vesicles 5600 cGy in 40 sessions   . B12 deficiency 06/05/2015    B12 Low NL at 271 methtylmalonic acid high  . Transfusion history     '72 -s/p surgery for duodenal ulcer  . Anemia     iron deficency  . Prostate cancer ([place]) 09/06/14    last radiation 4'16   . Skin cancer, basal cell   . Cardiac murmur     nothing to be concerned with   . Asthma   . Esophageal reflux     no currlently  . Gastric cancer ([place]) 07/18/15    invasive adenocarcinoma  . Allergy   . Gastrostomy tube in place [facility])     intermittent feeding  . Dysphagia     history throat/stomach  cancer "can swallow some, uses more pureed type foods"    Past Surgical History  Procedure Laterality Date  . Trigger finger release Left 1998  . Rotator cuff repair Right     R shoulder, [place]  . Knee arthroscopy Right     Right Knee, [place] [facility]  . Colonoscopy with polypectomy  02/2012    Dr Sharlett Iles  . Prostate biopsy  09/06/14  . Cataract extraction, bilateral Bilateral   . Vasectomy    . Eus  N/A 06/14/2015    Procedure: ESOPHAGEAL ENDOSCOPIC ULTRASOUND (EUS) RADIAL;  Surgeon: Milus Banister, MD;  Location: [facility];  Service: Endoscopy;  Laterality: N/A;  . Laparoscopy N/A 07/18/2015    Procedure: LAPAROSCOPY DIAGNOSTIC;  Surgeon: Stark Klein, MD;  Location: [facility];  Service: General;  Laterality: N/A;  . Laparoscopic gastrectomy N/A 07/18/2015    Procedure: SUB TOTAL GASTRECTOMY;  Surgeon: Stark Klein, MD;  Location: [facility];  Service: General;  Laterality: N/A;  . Laparoscopic gastrostomy N/A 07/18/2015    Procedure: FEEDING TUBE PLACEMENT;  Surgeon: Stark Klein, MD;  Location: [facility];  Service: General;  Laterality: N/A;  . Esophagogastroduodenoscopy (egd) with propofol N/A 08/17/2015    Procedure: ESOPHAGOGASTRODUODENOSCOPY (EGD) WITH PROPOFOL;  Surgeon: Gatha Mayer, MD;  Location: [facility];  Service: Endoscopy;  Laterality: N/A;  . Esophagogastroduodenoscopy (egd) with propofol N/A 09/12/2015    Procedure: ESOPHAGOGASTRODUODENOSCOPY (EGD) WITH PROPOFOL;  Surgeon: Gatha Mayer, MD;  Location: [facility];  Service: Endoscopy;  Laterality: N/A;  . Balloon dilation N/A 09/12/2015    Procedure: BALLOON DILATION;  Surgeon: Gatha Mayer, MD;  Location: [facility];  Service: Endoscopy;  Laterality: N/A;  . Esophagogastroduodenoscopy (egd) with propofol N/A 09/21/2015    Procedure: ESOPHAGOGASTRODUODENOSCOPY (EGD) WITH PROPOFOL;  Surgeon:  Gatha Mayer, MD;  Location: [facility];  Service: Endoscopy;  Laterality: N/A;  . Balloon dilation N/A 09/21/2015    Procedure: BALLOON DILATION;  Surgeon: Gatha Mayer, MD;  Location: [facility];  Service: Endoscopy;  Laterality: N/A;  . Esophagogastroduodenoscopy (egd) with propofol N/A 10/04/2015    Procedure: ESOPHAGOGASTRODUODENOSCOPY (EGD) WITH PROPOFOL;  Surgeon: Milus Banister, MD;  [facility];  Service: Endoscopy;  Laterality: N/A;  possible stricture  . Balloon dilation N/A 10/04/2015    Procedure:  BALLOON DILATION;  Surgeon: Milus Banister, MD;  Location: [facility];  Service: Endoscopy;  Laterality: N/A;  . Esophagogastroduodenoscopy (egd) with propofol N/A 12/10/2015    Procedure: ESOPHAGOGASTRODUODENOSCOPY (EGD) WITH PROPOFOL;  Surgeon: Gatha Mayer, MD;  [facility];  Service: Endoscopy;  Laterality: N/A;  . Balloon dilation N/A 12/10/2015    Procedure: BALLOON DILATION;  Surgeon: Gatha Mayer, MD;  [facility]: [facility];  Service: Endoscopy;  Laterality: N/A;  . Melanoma mole removed from stomach  02-01-16    Prior to Admission medications   Medication Sig Start Date End Date Taking? Authorizing Provider  acetaminophen (TYLENOL) 500 MG tablet Take 500 mg by mouth every 6 (six) hours as needed (For pain.).   Yes Historical Provider, MD  cyanocobalamin 1000 MCG tablet Take 1,000 mcg by mouth daily.   Yes Historical Provider, MD  Multiple Vitamin (MULTIVITAMIN WITH MINERALS) TABS tablet Take 1 tablet by mouth daily.   Yes Historical Provider, MD  pravastatin (PRAVACHOL) 40 MG tablet Take 1 tablet (40 mg total) by mouth daily. Patient taking differently: Take 40 mg by mouth at bedtime.  12/10/15  Yes Gatha Mayer, MD  ferrous sulfate 325 (65 FE) MG EC tablet Take 1 tablet (325 mg total) by mouth daily. Patient not taking: Reported on 01/18/2016 09/06/15   Ladell Pier, MD  omeprazole (PRILOSEC) 40 MG capsule Take 1 capsule (40 mg total) by mouth 2 (two) times daily before a meal. 30 MINS BEFORE BREAKFAST/SUPPER Patient not taking: Reported on 02/05/2016 08/17/15   Gatha Mayer, MD  potassium chloride SA (K-DUR,KLOR-CON) 20 MEQ tablet Take 1 tablet (20 mEq total) by mouth once. Patient not taking: Reported on 02/05/2016 01/11/16   Ladell Pier, MD    Current Facility-Administered Medications  Medication Dose Route Frequency Provider Last Rate Last Dose  . 0.9 %  sodium chloride infusion   Intravenous Continuous Gatha Mayer, MD      . lactated ringers infusion    Intravenous Continuous Gatha Mayer, MD 125 mL/hr at 02/06/16 1219 1,000 mL at 02/06/16 1219  . meperidine (DEMEROL) injection 6.25-12.5 mg  6.25-12.5 mg Intravenous Q5 min PRN Alexis Frock, MD        Allergies as of 02/04/2016 - Review Complete 02/04/2016  Allergen Reaction Noted  . Codeine      Family History  Problem Relation Age of Onset  . Diabetes Father   . Heart attack Father 69  . Liver disease Father     Related to Alcohol Use   . Alcohol abuse Mother   . Cirrhosis Mother     died in her 11s  . Multiple myeloma Brother   . Colon cancer Neg Hx   . Stomach cancer Neg Hx   . Stroke Neg Hx   . Diabetes Sister     Social History   Social History  . Marital Status: Married    Spouse Name: N/A  . Number of Children: 2  .  Years of Education: N/A   Occupational History  . retired    Social History Main Topics  . Smoking status: Former Smoker -- 0.50 packs/day for 5 years    Types: Cigarettes    Quit date: 10/07/1967  . Smokeless tobacco: Never Used     Comment: smoked [place] , up to < 1 ppd  . Alcohol Use: 0.0 oz/week     Comment:  < 3 / week- none in   month  . Drug Use: No  . Sexual Activity: Not on file   Other Topics Concern  . Not on file   Social History Narrative   Married and retired - 1 son and 1 daughter    Lives at [place] and active golfer, tennis player   05/30/2015       Review of Systems: Positive for recent melanoma excision left abdominal wall All other review of systems negative except as mentioned in the HPI.  Physical Exam: Vital signs in last 24 hours: Temp:  [98.1 F (36.7 C)] 98.1 F (36.7 C) (05/03 1212) Pulse Rate:  [60] 60 (05/03 1212) Resp:  [10] 10 (05/03 1212) BP: (121)/(74) 121/74 mmHg (05/03 1212) SpO2:  [100 %] 100 % (05/03 1212) Weight:  [162 lb (73.483 kg)] 162 lb (73.483 kg) (05/03 1212)   General:   Alert,  Well-developed, well-nourished, pleasant and cooperative in NAD Lungs:  Clear throughout to  auscultation.   Heart:  Regular rate and rhythm; no murmurs, clicks, rubs,  or gallops. Abdomen:  Soft, nontender and nondistended. Normal bowel sounds.   Neuro/Psych:  Alert and cooperative. Normal mood and affect. A and O x 3   @Zaylia Riolo  Simonne Maffucci, MD, [facility] [phone] (pager) 02/06/2016 1:11 PM@

## 2016-02-06 NOTE — Op Note (Signed)
[facility] Patient Name: James [name] Procedure Date: 02/06/2016 MRN: [id number] Attending MD: Gatha Mayer , MD Date of Birth: 1945-10-07 CSN:  Age: 70 Admit Type: Outpatient Procedure:                Upper GI endoscopy Indications:              Therapeutic procedure, Dysphagia, Stricture of the                            esophagus Providers:                Gatha Mayer, MD, Tory Emerald, RN, Ralene Bathe,                            Technician, Adair Laundry, CRNA Referring MD:              Medicines:                Monitored Anesthesia Care Complications:            No immediate complications. Estimated blood loss:                            Minimal. Estimated Blood Loss:     Estimated blood loss was minimal. Procedure:                Pre-Anesthesia Assessment:                           - Prior to the procedure, a History and Physical                            was performed, and patient medications and                            allergies were reviewed. The patient's tolerance of                            previous anesthesia was also reviewed. The risks                            and benefits of the procedure and the sedation                            options and risks were discussed with the patient.                            All questions were answered, and informed consent                            was obtained. Prior Anticoagulants: The patient has                            taken no previous anticoagulant or antiplatelet                            agents. ASA  Grade Assessment: III - A patient with                            severe systemic disease. After reviewing the risks                            and benefits, the patient was deemed in                            satisfactory condition to undergo the procedure.                           After obtaining informed consent, the endoscope was                            passed under direct vision.  Throughout the                            procedure, the patient's blood pressure, pulse, and                            oxygen saturations were monitored continuously. The                            [id number]) scope was introduced through the                            mouth, and advanced to the antrum of the stomach.                            The upper GI endoscopy was accomplished without                            difficulty. The patient tolerated the procedure                            well. Scope In: Scope Out: Findings:      One severe benign-appearing, intrinsic stenosis was found at the       gastroesophageal junction. This measured 8 mm (inner diameter) x 1 cm       (in length) and was traversed after dilation. A TTS dilator was passed       through the scope. Dilation with a 15-16.5-18 mm balloon dilator was       performed to 18 mm. The dilation site was examined and showed moderate       improvement in luminal narrowing. Estimated blood loss was minimal.      Evidence of a proximal gastric resection were found in the gastric body. Impression:               - Benign-appearing esophageal stenosis. Dilated.                           - A proximal gastric resection were found.                           -  No specimens collected. Moderate Sedation:      Please see anesthesia notes, moderate sedation not given Recommendation:           - Patient has a contact number available for                            emergencies. The signs and symptoms of potential                            delayed complications were discussed with the                            patient. Return to normal activities tomorrow.                            Written discharge instructions were provided to the                            patient.                           - Clear liquids x 1 hour then soft foods rest of                            day. Start prior diet tomorrow.                           -  Continue present medications.                           - Repeat upper endoscopy if dysphagia returns.                           - Use Protonix (pantoprazole) 40 mg PO daily                            indefinitely.                           - IF NEEDS ANOTHER DILATION WILL LIKELY USE                            TRIAMCINOLONE Procedure Code(s):        --- Professional ---                           [phone], Esophagoscopy, flexible, transoral; with                            transendoscopic balloon dilation (less than 30 mm                            diameter) Diagnosis Code(s):        --- Professional ---                           K22.2,  Esophageal obstruction                           R13.10, Dysphagia, unspecified CPT copyright 2016 American Medical Association. All rights reserved. The codes documented in this report are preliminary and upon coder review may  be revised to meet current compliance requirements. Gatha Mayer, MD 02/06/2016 2:02:11 PM This report has been signed electronically. Number of Addenda: 0

## 2016-02-06 NOTE — Progress Notes (Signed)
Quick Note:  Labs ok to dilate today ______

## 2016-02-06 NOTE — Discharge Instructions (Addendum)
° °  I dilated to 18 mm today. Call me again when needed.  Restart Prilosec before breakfast  YOU HAD AN ENDOSCOPIC PROCEDURE TODAY: Refer to the procedure report and other information in the discharge instructions given to you for any specific questions about what was found during the examination. If this information does not answer your questions, please call Dr. Celesta Aver office at [phone] to clarify.   YOU SHOULD EXPECT: Some feelings of bloating in the abdomen. Passage of more gas than usual. Walking can help get rid of the air that was put into your GI tract during the procedure and reduce the bloating. If you had a lower endoscopy (such as a colonoscopy or flexible sigmoidoscopy) you may notice spotting of blood in your stool or on the toilet paper. Some abdominal soreness may be present for a day or two, also.  DIET:  Clear liquids until 3 PM then soft diet then back to typical diet tomorrow.    ACTIVITY: Your care partner should take you home directly after the procedure. You should plan to take it easy, moving slowly for the rest of the day. You can resume normal activity the day after the procedure however YOU SHOULD NOT DRIVE, use power tools, machinery or perform tasks that involve climbing or major physical exertion for 24 hours (because of the sedation medicines used during the test).   SYMPTOMS TO REPORT IMMEDIATELY: A gastroenterologist can be reached at any hour. Please call [phone]  for any of the following symptoms:  Following upper endoscopy (EGD, EUS, ERCP, esophageal dilation) Vomiting of blood or coffee ground material  New, significant abdominal pain  New, significant chest pain or pain under the shoulder blades  Painful or persistently difficult swallowing  New shortness of breath  Black, tarry-looking or red, bloody stools  FOLLOW UP:  If any biopsies were taken you will be contacted by phone or by letter within the next 1-3 weeks. Call [phone]   if you have not heard about the biopsies in 3 weeks.  Please also call with any specific questions about appointments or follow up tests.

## 2016-02-06 NOTE — Telephone Encounter (Signed)
Patient notified He will come this am between 930-10.  Ordered stat

## 2016-02-06 NOTE — Transfer of Care (Signed)
Immediate Anesthesia Transfer of Care Note  Patient: James [name]  Procedure(s) Performed: Procedure(s): ESOPHAGOGASTRODUODENOSCOPY (EGD) WITH PROPOFOL (N/A) SAVORY DILATION (N/A)  Patient Location: ENDO  Anesthesia Type:MAC  Level of Consciousness:  sedated, patient cooperative and responds to stimulation  Airway & Oxygen Therapy:Patient Spontanous Breathing and Patient connected to face mask oxgen  Post-op Assessment:  Report given to ENDO RN and Post -op Vital signs reviewed and stable  Post vital signs:  Reviewed and stable  Last Vitals:  Filed Vitals:   02/06/16 1212 02/06/16 1353  BP: 121/74   Pulse: 60   Temp: 36.7 C 36.4 C  Resp: 10     Complications: No apparent anesthesia complications

## 2016-02-06 NOTE — Anesthesia Postprocedure Evaluation (Signed)
Anesthesia Post Note  Patient: James [name]  Procedure(s) Performed: Procedure(s) (LRB): ESOPHAGOGASTRODUODENOSCOPY (EGD) WITH PROPOFOL (N/A) SAVORY DILATION (N/A)  Patient location during evaluation: Endoscopy Anesthesia Type: MAC Level of consciousness: awake and alert Pain management: pain level controlled Vital Signs Assessment: post-procedure vital signs reviewed and stable Respiratory status: spontaneous breathing, nonlabored ventilation, respiratory function stable and patient connected to nasal cannula oxygen Cardiovascular status: blood pressure returned to baseline and stable Postop Assessment: no signs of nausea or vomiting Anesthetic complications: no    Last Vitals:  Filed Vitals:   02/06/16 1353 02/06/16 1355  BP:  138/71  Pulse: 58 56  Temp: 36.4 C   Resp: 14 19    Last Pain: There were no vitals filed for this visit.               Alexis Frock

## 2016-02-06 NOTE — Telephone Encounter (Signed)
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

## 2016-02-07 ENCOUNTER — Ambulatory Visit (HOSPITAL_COMMUNITY): Admission: RE | Admit: 2016-02-07 | Payer: Medicare Other | Source: Ambulatory Visit | Admitting: Internal Medicine

## 2016-02-08 ENCOUNTER — Ambulatory Visit (HOSPITAL_BASED_OUTPATIENT_CLINIC_OR_DEPARTMENT_OTHER): Payer: Medicare Other

## 2016-02-08 ENCOUNTER — Other Ambulatory Visit (HOSPITAL_BASED_OUTPATIENT_CLINIC_OR_DEPARTMENT_OTHER): Payer: Medicare Other

## 2016-02-08 VITALS — BP 103/67 | HR 67 | Temp 97.8°F | Resp 18

## 2016-02-08 DIAGNOSIS — Z5111 Encounter for antineoplastic chemotherapy: Secondary | ICD-10-CM | POA: Diagnosis not present

## 2016-02-08 DIAGNOSIS — C165 Malignant neoplasm of lesser curvature of stomach, unspecified: Secondary | ICD-10-CM

## 2016-02-08 DIAGNOSIS — C161 Malignant neoplasm of fundus of stomach: Secondary | ICD-10-CM

## 2016-02-08 LAB — CBC WITH DIFFERENTIAL/PLATELET
BASO%: 0.6 % (ref 0.0–2.0)
Basophils Absolute: 0 10*3/uL (ref 0.0–0.1)
EOS%: 3.9 % (ref 0.0–7.0)
Eosinophils Absolute: 0.1 10*3/uL (ref 0.0–0.5)
HCT: 28.9 % — ABNORMAL LOW (ref 38.4–49.9)
HEMOGLOBIN: 9.7 g/dL — AB (ref 13.0–17.1)
LYMPH#: 0.7 10*3/uL — AB (ref 0.9–3.3)
LYMPH%: 19.9 % (ref 14.0–49.0)
MCH: 36.3 pg — AB (ref 27.2–33.4)
MCHC: 33.7 g/dL (ref 32.0–36.0)
MCV: 107.8 fL — ABNORMAL HIGH (ref 79.3–98.0)
MONO#: 0.4 10*3/uL (ref 0.1–0.9)
MONO%: 12.3 % (ref 0.0–14.0)
NEUT%: 63.3 % (ref 39.0–75.0)
NEUTROS ABS: 2.2 10*3/uL (ref 1.5–6.5)
Platelets: 96 10*3/uL — ABNORMAL LOW (ref 140–400)
RBC: 2.68 10*6/uL — ABNORMAL LOW (ref 4.20–5.82)
RDW: 22.7 % — AB (ref 11.0–14.6)
WBC: 3.5 10*3/uL — ABNORMAL LOW (ref 4.0–10.3)

## 2016-02-08 MED ORDER — PROCHLORPERAZINE MALEATE 10 MG PO TABS
ORAL_TABLET | ORAL | Status: AC
  Filled 2016-02-08: qty 1

## 2016-02-08 MED ORDER — SODIUM CHLORIDE 0.9 % IV SOLN
Freq: Once | INTRAVENOUS | Status: AC
  Administered 2016-02-08: 13:00:00 via INTRAVENOUS

## 2016-02-08 MED ORDER — FLUOROURACIL CHEMO INJECTION 2.5 GM/50ML
400.0000 mg/m2 | Freq: Once | INTRAVENOUS | Status: AC
  Administered 2016-02-08: 800 mg via INTRAVENOUS
  Filled 2016-02-08: qty 16

## 2016-02-08 MED ORDER — LEUCOVORIN CALCIUM INJECTION 350 MG
400.0000 mg/m2 | Freq: Once | INTRAVENOUS | Status: AC
  Administered 2016-02-08: 776 mg via INTRAVENOUS
  Filled 2016-02-08: qty 38.8

## 2016-02-08 MED ORDER — PROCHLORPERAZINE MALEATE 10 MG PO TABS
10.0000 mg | ORAL_TABLET | Freq: Once | ORAL | Status: AC
  Administered 2016-02-08: 10 mg via ORAL

## 2016-02-08 NOTE — Progress Notes (Signed)
Per Ned Card NP, Okay to tx with platelets 96.

## 2016-02-08 NOTE — Patient Instructions (Addendum)
[facility] Discharge Instructions for Patients Receiving Chemotherapy  Today you received the following chemotherapy agents: Leucovorin and 5FU.  To help prevent nausea and vomiting after your treatment, we encourage you to take your nausea medication: as directed.   If you develop nausea and vomiting that is not controlled by your nausea medication, call the clinic.   BELOW ARE SYMPTOMS THAT SHOULD BE REPORTED IMMEDIATELY:  *FEVER GREATER THAN 100.5 F  *CHILLS WITH OR WITHOUT FEVER  NAUSEA AND VOMITING THAT IS NOT CONTROLLED WITH YOUR NAUSEA MEDICATION  *UNUSUAL SHORTNESS OF BREATH  *UNUSUAL BRUISING OR BLEEDING  TENDERNESS IN MOUTH AND THROAT WITH OR WITHOUT PRESENCE OF ULCERS  *URINARY PROBLEMS  *BOWEL PROBLEMS  UNUSUAL RASH Items with * indicate a potential emergency and should be followed up as soon as possible.  Feel free to call the clinic you have any questions or concerns. The clinic phone number is [phone].  Please show the [facility] at check-in to the Emergency Department and triage nurse.  Fluorouracil, 5-FU injection What is this medicine? FLUOROURACIL, 5-FU (flure oh YOOR a sil) is a chemotherapy drug. It slows the growth of cancer cells. This medicine is used to treat many types of cancer like breast cancer, colon or rectal cancer, pancreatic cancer, and stomach cancer. This medicine may be used for other purposes; ask your health care provider or pharmacist if you have questions. What should I tell my health care provider before I take this medicine? They need to know if you have any of these conditions: -blood disorders -dihydropyrimidine dehydrogenase (DPD) deficiency -infection (especially a virus infection such as chickenpox, cold sores, or herpes) -kidney disease -liver disease -malnourished, poor nutrition -recent or ongoing radiation therapy -an unusual or allergic reaction to fluorouracil, other chemotherapy, other  medicines, foods, dyes, or preservatives -pregnant or trying to get pregnant -breast-feeding How should I use this medicine? This drug is given as an infusion or injection into a vein. It is administered in a hospital or clinic by a specially trained health care professional. Talk to your pediatrician regarding the use of this medicine in children. Special care may be needed. Overdosage: If you think you have taken too much of this medicine contact a poison control center or emergency room at once. NOTE: This medicine is only for you. Do not share this medicine with others. What if I miss a dose? It is important not to miss your dose. Call your doctor or health care professional if you are unable to keep an appointment. What may interact with this medicine? -allopurinol -cimetidine -dapsone -digoxin -hydroxyurea -leucovorin -levamisole -medicines for seizures like ethotoin, fosphenytoin, phenytoin -medicines to increase blood counts like filgrastim, pegfilgrastim, sargramostim -medicines that treat or prevent blood clots like warfarin, enoxaparin, and dalteparin -methotrexate -metronidazole -pyrimethamine -some other chemotherapy drugs like busulfan, cisplatin, estramustine, vinblastine -trimethoprim -trimetrexate -vaccines Talk to your doctor or health care professional before taking any of these medicines: -acetaminophen -aspirin -ibuprofen -ketoprofen -naproxen This list may not describe all possible interactions. Give your health care provider a list of all the medicines, herbs, non-prescription drugs, or dietary supplements you use. Also tell them if you smoke, drink alcohol, or use illegal drugs. Some items may interact with your medicine. What should I watch for while using this medicine? Visit your doctor for checks on your progress. This drug may make you feel generally unwell. This is not uncommon, as chemotherapy can affect healthy cells as well as cancer cells. Report  any  side effects. Continue your course of treatment even though you feel ill unless your doctor tells you to stop. In some cases, you may be given additional medicines to help with side effects. Follow all directions for their use. Call your doctor or health care professional for advice if you get a fever, chills or sore throat, or other symptoms of a cold or flu. Do not treat yourself. This drug decreases your body's ability to fight infections. Try to avoid being around people who are sick. This medicine may increase your risk to bruise or bleed. Call your doctor or health care professional if you notice any unusual bleeding. Be careful brushing and flossing your teeth or using a toothpick because you may get an infection or bleed more easily. If you have any dental work done, tell your dentist you are receiving this medicine. Avoid taking products that contain aspirin, acetaminophen, ibuprofen, naproxen, or ketoprofen unless instructed by your doctor. These medicines may hide a fever. Do not become pregnant while taking this medicine. Women should inform their doctor if they wish to become pregnant or think they might be pregnant. There is a potential for serious side effects to an unborn child. Talk to your health care professional or pharmacist for more information. Do not breast-feed an infant while taking this medicine. Men should inform their doctor if they wish to father a child. This medicine may lower sperm counts. Do not treat diarrhea with over the counter products. Contact your doctor if you have diarrhea that lasts more than 2 days or if it is severe and watery. This medicine can make you more sensitive to the sun. Keep out of the sun. If you cannot avoid being in the sun, wear protective clothing and use sunscreen. Do not use sun lamps or tanning beds/booths. What side effects may I notice from receiving this medicine? Side effects that you should report to your doctor or health care  professional as soon as possible: -allergic reactions like skin rash, itching or hives, swelling of the face, lips, or tongue -low blood counts - this medicine may decrease the number of white blood cells, red blood cells and platelets. You may be at increased risk for infections and bleeding. -signs of infection - fever or chills, cough, sore throat, pain or difficulty passing urine -signs of decreased platelets or bleeding - bruising, pinpoint red spots on the skin, black, tarry stools, blood in the urine -signs of decreased red blood cells - unusually weak or tired, fainting spells, lightheadedness -breathing problems -changes in vision -chest pain -mouth sores -nausea and vomiting -pain, swelling, redness at site where injected -pain, tingling, numbness in the hands or feet -redness, swelling, or sores on hands or feet -stomach pain -unusual bleeding Side effects that usually do not require medical attention (report to your doctor or health care professional if they continue or are bothersome): -changes in finger or toe nails -diarrhea -dry or itchy skin -hair loss -headache -loss of appetite -sensitivity of eyes to the light -stomach upset -unusually teary eyes This list may not describe all possible side effects. Call your doctor for medical advice about side effects. You may report side effects to FDA at 1-800-FDA-1088. Where should I keep my medicine? This drug is given in a hospital or clinic and will not be stored at home. NOTE: This sheet is a summary. It may not cover all possible information. If you have questions about this medicine, talk to your doctor, pharmacist, or health care provider.  2016, [employer]. (2008-01-26 13:53:16) Leucovorin injection What is this medicine? LEUCOVORIN (loo koe VOR in) is used to prevent or treat the harmful effects of some medicines. This medicine is used to treat anemia caused by a low amount of folic acid in the body.  It is also used with 5-fluorouracil (5-FU) to treat colon cancer. This medicine may be used for other purposes; ask your health care provider or pharmacist if you have questions. What should I tell my health care provider before I take this medicine? They need to know if you have any of these conditions: -anemia from low levels of vitamin B-12 in the blood -an unusual or allergic reaction to leucovorin, folic acid, other medicines, foods, dyes, or preservatives -pregnant or trying to get pregnant -breast-feeding How should I use this medicine? This medicine is for injection into a muscle or into a vein. It is given by a health care professional in a hospital or clinic setting. Talk to your pediatrician regarding the use of this medicine in children. Special care may be needed. Overdosage: If you think you have taken too much of this medicine contact a poison control center or emergency room at once. NOTE: This medicine is only for you. Do not share this medicine with others. What if I miss a dose? This does not apply. What may interact with this medicine? -capecitabine -fluorouracil -phenobarbital -phenytoin -primidone -trimethoprim-sulfamethoxazole This list may not describe all possible interactions. Give your health care provider a list of all the medicines, herbs, non-prescription drugs, or dietary supplements you use. Also tell them if you smoke, drink alcohol, or use illegal drugs. Some items may interact with your medicine. What should I watch for while using this medicine? Your condition will be monitored carefully while you are receiving this medicine. This medicine may increase the side effects of 5-fluorouracil, 5-FU. Tell your doctor or health care professional if you have diarrhea or mouth sores that do not get better or that get worse. What side effects may I notice from receiving this medicine? Side effects that you should report to your doctor or health care professional as  soon as possible: -allergic reactions like skin rash, itching or hives, swelling of the face, lips, or tongue -breathing problems -fever, infection -mouth sores -unusual bleeding or bruising -unusually weak or tired Side effects that usually do not require medical attention (report to your doctor or health care professional if they continue or are bothersome): -constipation or diarrhea -loss of appetite -nausea, vomiting This list may not describe all possible side effects. Call your doctor for medical advice about side effects. You may report side effects to FDA at 1-800-FDA-1088. Where should I keep my medicine? This drug is given in a hospital or clinic and will not be stored at home. NOTE: This sheet is a summary. It may not cover all possible information. If you have questions about this medicine, talk to your doctor, pharmacist, or health care provider.    2016, [employer]vier/[employer] Standard. (2008-03-28 16:50:29)

## 2016-02-10 ENCOUNTER — Encounter (HOSPITAL_COMMUNITY): Payer: Self-pay | Admitting: Internal Medicine

## 2016-02-15 ENCOUNTER — Other Ambulatory Visit (HOSPITAL_BASED_OUTPATIENT_CLINIC_OR_DEPARTMENT_OTHER): Payer: Medicare Other

## 2016-02-15 ENCOUNTER — Ambulatory Visit (HOSPITAL_BASED_OUTPATIENT_CLINIC_OR_DEPARTMENT_OTHER): Payer: Medicare Other

## 2016-02-15 ENCOUNTER — Other Ambulatory Visit: Payer: Self-pay | Admitting: *Deleted

## 2016-02-15 ENCOUNTER — Other Ambulatory Visit: Payer: Self-pay | Admitting: Nurse Practitioner

## 2016-02-15 VITALS — BP 100/62 | HR 61 | Temp 98.1°F | Resp 18

## 2016-02-15 DIAGNOSIS — C161 Malignant neoplasm of fundus of stomach: Secondary | ICD-10-CM

## 2016-02-15 DIAGNOSIS — C165 Malignant neoplasm of lesser curvature of stomach, unspecified: Secondary | ICD-10-CM

## 2016-02-15 DIAGNOSIS — Z5111 Encounter for antineoplastic chemotherapy: Secondary | ICD-10-CM

## 2016-02-15 LAB — CBC WITH DIFFERENTIAL/PLATELET
BASO%: 0.7 % (ref 0.0–2.0)
BASOS ABS: 0 10*3/uL (ref 0.0–0.1)
EOS ABS: 0.1 10*3/uL (ref 0.0–0.5)
EOS%: 4 % (ref 0.0–7.0)
HEMATOCRIT: 26.7 % — AB (ref 38.4–49.9)
HEMOGLOBIN: 9.4 g/dL — AB (ref 13.0–17.1)
LYMPH#: 0.7 10*3/uL — AB (ref 0.9–3.3)
LYMPH%: 23.7 % (ref 14.0–49.0)
MCH: 36.3 pg — ABNORMAL HIGH (ref 27.2–33.4)
MCHC: 35.2 g/dL (ref 32.0–36.0)
MCV: 103.1 fL — AB (ref 79.3–98.0)
MONO#: 0.5 10*3/uL (ref 0.1–0.9)
MONO%: 16.8 % — ABNORMAL HIGH (ref 0.0–14.0)
NEUT#: 1.5 10*3/uL (ref 1.5–6.5)
NEUT%: 54.8 % (ref 39.0–75.0)
PLATELETS: 79 10*3/uL — AB (ref 140–400)
RBC: 2.59 10*6/uL — ABNORMAL LOW (ref 4.20–5.82)
RDW: 17.4 % — ABNORMAL HIGH (ref 11.0–14.6)
WBC: 2.7 10*3/uL — ABNORMAL LOW (ref 4.0–10.3)
nRBC: 0 % (ref 0–0)

## 2016-02-15 MED ORDER — PROCHLORPERAZINE MALEATE 10 MG PO TABS
10.0000 mg | ORAL_TABLET | Freq: Once | ORAL | Status: AC
  Administered 2016-02-15: 10 mg via ORAL

## 2016-02-15 MED ORDER — HEPARIN SOD (PORK) LOCK FLUSH 100 UNIT/ML IV SOLN
500.0000 [IU] | Freq: Once | INTRAVENOUS | Status: DC | PRN
  Filled 2016-02-15: qty 5

## 2016-02-15 MED ORDER — SODIUM CHLORIDE 0.9 % IV SOLN
Freq: Once | INTRAVENOUS | Status: AC
  Administered 2016-02-15: 10:00:00 via INTRAVENOUS

## 2016-02-15 MED ORDER — PROCHLORPERAZINE MALEATE 10 MG PO TABS
ORAL_TABLET | ORAL | Status: AC
  Filled 2016-02-15: qty 1

## 2016-02-15 MED ORDER — SODIUM CHLORIDE 0.9 % IJ SOLN
10.0000 mL | INTRAMUSCULAR | Status: DC | PRN
  Filled 2016-02-15: qty 10

## 2016-02-15 MED ORDER — FLUOROURACIL CHEMO INJECTION 2.5 GM/50ML
400.0000 mg/m2 | Freq: Once | INTRAVENOUS | Status: AC
  Administered 2016-02-15: 800 mg via INTRAVENOUS
  Filled 2016-02-15: qty 16

## 2016-02-15 MED ORDER — LEUCOVORIN CALCIUM INJECTION 350 MG
400.0000 mg/m2 | Freq: Once | INTRAVENOUS | Status: AC
  Administered 2016-02-15: 776 mg via INTRAVENOUS
  Filled 2016-02-15: qty 38.8

## 2016-02-15 NOTE — Progress Notes (Signed)
No cmet per Lattie Haw, NP 1002 NP reviewed CBC/CMEt with Dr. Benay Spice, ok to treat despite counts.

## 2016-02-15 NOTE — Patient Instructions (Signed)
[facility] Discharge Instructions for Patients Receiving Chemotherapy  Today you received the following chemotherapy agents: Leucovorin and 5FU.  To help prevent nausea and vomiting after your treatment, we encourage you to take your nausea medication: as directed.   If you develop nausea and vomiting that is not controlled by your nausea medication, call the clinic.   BELOW ARE SYMPTOMS THAT SHOULD BE REPORTED IMMEDIATELY:  *FEVER GREATER THAN 100.5 F  *CHILLS WITH OR WITHOUT FEVER  NAUSEA AND VOMITING THAT IS NOT CONTROLLED WITH YOUR NAUSEA MEDICATION  *UNUSUAL SHORTNESS OF BREATH  *UNUSUAL BRUISING OR BLEEDING  TENDERNESS IN MOUTH AND THROAT WITH OR WITHOUT PRESENCE OF ULCERS  *URINARY PROBLEMS  *BOWEL PROBLEMS  UNUSUAL RASH Items with * indicate a potential emergency and should be followed up as soon as possible.  Feel free to call the clinic you have any questions or concerns. The clinic phone number is [phone].  Please show the [facility] at check-in to the Emergency Department and triage nurse.  Fluorouracil, 5-FU injection What is this medicine? FLUOROURACIL, 5-FU (flure oh YOOR a sil) is a chemotherapy drug. It slows the growth of cancer cells. This medicine is used to treat many types of cancer like breast cancer, colon or rectal cancer, pancreatic cancer, and stomach cancer. This medicine may be used for other purposes; ask your health care provider or pharmacist if you have questions. What should I tell my health care provider before I take this medicine? They need to know if you have any of these conditions: -blood disorders -dihydropyrimidine dehydrogenase (DPD) deficiency -infection (especially a virus infection such as chickenpox, cold sores, or herpes) -kidney disease -liver disease -malnourished, poor nutrition -recent or ongoing radiation therapy -an unusual or allergic reaction to fluorouracil, other chemotherapy, other  medicines, foods, dyes, or preservatives -pregnant or trying to get pregnant -breast-feeding How should I use this medicine? This drug is given as an infusion or injection into a vein. It is administered in a hospital or clinic by a specially trained health care professional. Talk to your pediatrician regarding the use of this medicine in children. Special care may be needed. Overdosage: If you think you have taken too much of this medicine contact a poison control center or emergency room at once. NOTE: This medicine is only for you. Do not share this medicine with others. What if I miss a dose? It is important not to miss your dose. Call your doctor or health care professional if you are unable to keep an appointment. What may interact with this medicine? -allopurinol -cimetidine -dapsone -digoxin -hydroxyurea -leucovorin -levamisole -medicines for seizures like ethotoin, fosphenytoin, phenytoin -medicines to increase blood counts like filgrastim, pegfilgrastim, sargramostim -medicines that treat or prevent blood clots like warfarin, enoxaparin, and dalteparin -methotrexate -metronidazole -pyrimethamine -some other chemotherapy drugs like busulfan, cisplatin, estramustine, vinblastine -trimethoprim -trimetrexate -vaccines Talk to your doctor or health care professional before taking any of these medicines: -acetaminophen -aspirin -ibuprofen -ketoprofen -naproxen This list may not describe all possible interactions. Give your health care provider a list of all the medicines, herbs, non-prescription drugs, or dietary supplements you use. Also tell them if you smoke, drink alcohol, or use illegal drugs. Some items may interact with your medicine. What should I watch for while using this medicine? Visit your doctor for checks on your progress. This drug may make you feel generally unwell. This is not uncommon, as chemotherapy can affect healthy cells as well as cancer cells. Report  any  side effects. Continue your course of treatment even though you feel ill unless your doctor tells you to stop. In some cases, you may be given additional medicines to help with side effects. Follow all directions for their use. Call your doctor or health care professional for advice if you get a fever, chills or sore throat, or other symptoms of a cold or flu. Do not treat yourself. This drug decreases your body's ability to fight infections. Try to avoid being around people who are sick. This medicine may increase your risk to bruise or bleed. Call your doctor or health care professional if you notice any unusual bleeding. Be careful brushing and flossing your teeth or using a toothpick because you may get an infection or bleed more easily. If you have any dental work done, tell your dentist you are receiving this medicine. Avoid taking products that contain aspirin, acetaminophen, ibuprofen, naproxen, or ketoprofen unless instructed by your doctor. These medicines may hide a fever. Do not become pregnant while taking this medicine. Women should inform their doctor if they wish to become pregnant or think they might be pregnant. There is a potential for serious side effects to an unborn child. Talk to your health care professional or pharmacist for more information. Do not breast-feed an infant while taking this medicine. Men should inform their doctor if they wish to father a child. This medicine may lower sperm counts. Do not treat diarrhea with over the counter products. Contact your doctor if you have diarrhea that lasts more than 2 days or if it is severe and watery. This medicine can make you more sensitive to the sun. Keep out of the sun. If you cannot avoid being in the sun, wear protective clothing and use sunscreen. Do not use sun lamps or tanning beds/booths. What side effects may I notice from receiving this medicine? Side effects that you should report to your doctor or health care  professional as soon as possible: -allergic reactions like skin rash, itching or hives, swelling of the face, lips, or tongue -low blood counts - this medicine may decrease the number of white blood cells, red blood cells and platelets. You may be at increased risk for infections and bleeding. -signs of infection - fever or chills, cough, sore throat, pain or difficulty passing urine -signs of decreased platelets or bleeding - bruising, pinpoint red spots on the skin, black, tarry stools, blood in the urine -signs of decreased red blood cells - unusually weak or tired, fainting spells, lightheadedness -breathing problems -changes in vision -chest pain -mouth sores -nausea and vomiting -pain, swelling, redness at site where injected -pain, tingling, numbness in the hands or feet -redness, swelling, or sores on hands or feet -stomach pain -unusual bleeding Side effects that usually do not require medical attention (report to your doctor or health care professional if they continue or are bothersome): -changes in finger or toe nails -diarrhea -dry or itchy skin -hair loss -headache -loss of appetite -sensitivity of eyes to the light -stomach upset -unusually teary eyes This list may not describe all possible side effects. Call your doctor for medical advice about side effects. You may report side effects to FDA at 1-800-FDA-1088. Where should I keep my medicine? This drug is given in a hospital or clinic and will not be stored at home. NOTE: This sheet is a summary. It may not cover all possible information. If you have questions about this medicine, talk to your doctor, pharmacist, or health care provider.  2016, [employer] Standard. (2008-01-26 13:53:16) Leucovorin injection What is this medicine? LEUCOVORIN (loo koe VOR in) is used to prevent or treat the harmful effects of some medicines. This medicine is used to treat anemia caused by a low amount of folic acid in the body.  It is also used with 5-fluorouracil (5-FU) to treat colon cancer. This medicine may be used for other purposes; ask your health care provider or pharmacist if you have questions. What should I tell my health care provider before I take this medicine? They need to know if you have any of these conditions: -anemia from low levels of vitamin B-12 in the blood -an unusual or allergic reaction to leucovorin, folic acid, other medicines, foods, dyes, or preservatives -pregnant or trying to get pregnant -breast-feeding How should I use this medicine? This medicine is for injection into a muscle or into a vein. It is given by a health care professional in a hospital or clinic setting. Talk to your pediatrician regarding the use of this medicine in children. Special care may be needed. Overdosage: If you think you have taken too much of this medicine contact a poison control center or emergency room at once. NOTE: This medicine is only for you. Do not share this medicine with others. What if I miss a dose? This does not apply. What may interact with this medicine? -capecitabine -fluorouracil -phenobarbital -phenytoin -primidone -trimethoprim-sulfamethoxazole This list may not describe all possible interactions. Give your health care provider a list of all the medicines, herbs, non-prescription drugs, or dietary supplements you use. Also tell them if you smoke, drink alcohol, or use illegal drugs. Some items may interact with your medicine. What should I watch for while using this medicine? Your condition will be monitored carefully while you are receiving this medicine. This medicine may increase the side effects of 5-fluorouracil, 5-FU. Tell your doctor or health care professional if you have diarrhea or mouth sores that do not get better or that get worse. What side effects may I notice from receiving this medicine? Side effects that you should report to your doctor or health care professional as  soon as possible: -allergic reactions like skin rash, itching or hives, swelling of the face, lips, or tongue -breathing problems -fever, infection -mouth sores -unusual bleeding or bruising -unusually weak or tired Side effects that usually do not require medical attention (report to your doctor or health care professional if they continue or are bothersome): -constipation or diarrhea -loss of appetite -nausea, vomiting This list may not describe all possible side effects. Call your doctor for medical advice about side effects. You may report side effects to FDA at 1-800-FDA-1088. Where should I keep my medicine? This drug is given in a hospital or clinic and will not be stored at home. NOTE: This sheet is a summary. It may not cover all possible information. If you have questions about this medicine, talk to your doctor, pharmacist, or health care provider.    2016, [employer]vier/[employer] Standard. (2008-03-28 16:50:29)

## 2016-02-21 ENCOUNTER — Ambulatory Visit (HOSPITAL_BASED_OUTPATIENT_CLINIC_OR_DEPARTMENT_OTHER): Payer: Medicare Other | Admitting: Nurse Practitioner

## 2016-02-21 ENCOUNTER — Ambulatory Visit (HOSPITAL_BASED_OUTPATIENT_CLINIC_OR_DEPARTMENT_OTHER): Payer: Medicare Other

## 2016-02-21 ENCOUNTER — Telehealth: Payer: Self-pay | Admitting: Nurse Practitioner

## 2016-02-21 ENCOUNTER — Telehealth: Payer: Self-pay

## 2016-02-21 ENCOUNTER — Other Ambulatory Visit (HOSPITAL_BASED_OUTPATIENT_CLINIC_OR_DEPARTMENT_OTHER): Payer: Medicare Other

## 2016-02-21 VITALS — BP 121/69 | HR 69 | Temp 98.3°F | Resp 17 | Ht 66.5 in | Wt 160.8 lb

## 2016-02-21 DIAGNOSIS — C161 Malignant neoplasm of fundus of stomach: Secondary | ICD-10-CM

## 2016-02-21 DIAGNOSIS — C762 Malignant neoplasm of abdomen: Secondary | ICD-10-CM

## 2016-02-21 DIAGNOSIS — C165 Malignant neoplasm of lesser curvature of stomach, unspecified: Secondary | ICD-10-CM

## 2016-02-21 DIAGNOSIS — Z5111 Encounter for antineoplastic chemotherapy: Secondary | ICD-10-CM

## 2016-02-21 DIAGNOSIS — R110 Nausea: Secondary | ICD-10-CM | POA: Diagnosis not present

## 2016-02-21 LAB — COMPREHENSIVE METABOLIC PANEL
AST: 23 U/L (ref 5–34)
Albumin: 3.8 g/dL (ref 3.5–5.0)
Alkaline Phosphatase: 58 U/L (ref 40–150)
Anion Gap: 7 mEq/L (ref 3–11)
BUN: 11 mg/dL (ref 7.0–26.0)
CALCIUM: 8.9 mg/dL (ref 8.4–10.4)
CHLORIDE: 109 meq/L (ref 98–109)
CO2: 26 meq/L (ref 22–29)
CREATININE: 0.7 mg/dL (ref 0.7–1.3)
EGFR: >90 mL/min/{1.73_m2} (ref >90)
Glucose: 94 mg/dl (ref 70–140)
POTASSIUM: 3.6 meq/L (ref 3.5–5.1)
SODIUM: 142 meq/L (ref 136–145)
Total Bilirubin: 0.85 mg/dL (ref 0.20–1.20)
Total Protein: 6.6 g/dL (ref 6.4–8.3)

## 2016-02-21 LAB — CBC WITH DIFFERENTIAL/PLATELET
BASO%: 0.7 % (ref 0.0–2.0)
BASOS ABS: 0 10*3/uL (ref 0.0–0.1)
EOS%: 4.4 % (ref 0.0–7.0)
Eosinophils Absolute: 0.1 10*3/uL (ref 0.0–0.5)
HEMATOCRIT: 27.4 % — AB (ref 38.4–49.9)
HEMOGLOBIN: 9.5 g/dL — AB (ref 13.0–17.1)
LYMPH#: 0.6 10*3/uL — AB (ref 0.9–3.3)
LYMPH%: 24.1 % (ref 14.0–49.0)
MCH: 37.8 pg — AB (ref 27.2–33.4)
MCHC: 34.7 g/dL (ref 32.0–36.0)
MCV: 109 fL — ABNORMAL HIGH (ref 79.3–98.0)
MONO#: 0.3 10*3/uL (ref 0.1–0.9)
MONO%: 11 % (ref 0.0–14.0)
NEUT#: 1.5 10*3/uL (ref 1.5–6.5)
NEUT%: 59.8 % (ref 39.0–75.0)
Platelets: 76 10*3/uL — ABNORMAL LOW (ref 140–400)
RBC: 2.51 10*6/uL — ABNORMAL LOW (ref 4.20–5.82)
RDW: 18.1 % — AB (ref 11.0–14.6)
WBC: 2.5 10*3/uL — ABNORMAL LOW (ref 4.0–10.3)

## 2016-02-21 MED ORDER — SODIUM CHLORIDE 0.9 % IV SOLN
Freq: Once | INTRAVENOUS | Status: AC
  Administered 2016-02-21: 12:00:00 via INTRAVENOUS

## 2016-02-21 MED ORDER — FLUOROURACIL CHEMO INJECTION 2.5 GM/50ML
400.0000 mg/m2 | Freq: Once | INTRAVENOUS | Status: AC
  Administered 2016-02-21: 800 mg via INTRAVENOUS
  Filled 2016-02-21: qty 16

## 2016-02-21 MED ORDER — PROCHLORPERAZINE MALEATE 10 MG PO TABS
ORAL_TABLET | ORAL | Status: AC
  Filled 2016-02-21: qty 1

## 2016-02-21 MED ORDER — PROCHLORPERAZINE MALEATE 10 MG PO TABS
10.0000 mg | ORAL_TABLET | Freq: Once | ORAL | Status: AC
  Administered 2016-02-21: 10 mg via ORAL

## 2016-02-21 MED ORDER — DEXTROSE 5 % IV SOLN
400.0000 mg/m2 | Freq: Once | INTRAVENOUS | Status: AC
  Administered 2016-02-21: 776 mg via INTRAVENOUS
  Filled 2016-02-21: qty 38.8

## 2016-02-21 NOTE — Telephone Encounter (Signed)
Called [facility] for the final pathology results from recent wide excision. Spoke with Stanton Kidney who will fax results over.

## 2016-02-21 NOTE — Progress Notes (Signed)
Ok to treat with today's labs, no need to wait for cmet per Lattie Haw Thomas/Dr. Benay Spice.

## 2016-02-21 NOTE — Progress Notes (Signed)
  James [name] OFFICE PROGRESS NOTE   Diagnosis:  Gastric cancer  INTERVAL HISTORY:   James [name] returns as scheduled. He was last treated with 5-FU/leucovorin 02/15/2016. He has mild nausea following chemotherapy. Appetite remains poor. Intermittent loose stools. He had a single mouth sore which has resolved. He recently fell playing tennis. He has bruising on the left arm, left leg and left buttock related to the fall. No spontaneous bruising/bleeding.  Objective:  Vital signs in last 24 hours:  Blood pressure 121/69, pulse 69, temperature 98.3 F (36.8 C), temperature source Oral, resp. rate 17, height 5' 6.5" (1.689 m), weight 160 lb 12.8 oz (72.938 kg), SpO2 100 %.    HEENT: No thrush or ulcers. Lymphatics: No palpable cervical or supraclavicular lymph nodes. Resp: Lungs clear bilaterally. Cardio: Regular rate and rhythm. GI: Abdomen soft and nontender. No hepatomegaly. Vascular: No leg edema. Skin: Bandage covering an incision at the left lower abdomen.    Lab Results:  Lab Results  Component Value Date   WBC 2.5* 02/21/2016   HGB 9.5* 02/21/2016   HCT 27.4* 02/21/2016   MCV 109.0* 02/21/2016   PLT 76* 02/21/2016   NEUTROABS 1.5 02/21/2016    Imaging:  No results found.  Medications: I have reviewed the patient's current medications.  Assessment/Plan: 1. Gastric cancer-adenocarcinoma of the gastric fundus/cardia  Staging CT scans 06/07/2015 with no evidence of distant metastatic disease, tiny nonspecific pulmonary nodules and liver lesions-likely benign  Subtotal gastrectomy and placement of a jejunostomy feeding tube 07/18/2015 for a T2,N0 tumor with negative surgical margins  Initiation of adjuvant 5-FU/leucovorin 08/28/2015, status post 3 weekly treatments completed 09/13/2015  Initiation of radiation and concurrent Xeloda 10/29/2015  11/26/2015 Xeloda dose reduced to 1000 mg twice daily due to progressive thrombocytopenia  Radiation  completed 12/06/2015  5-FU/leucovorin resumed 12/21/2015   2. History of microcytic anemia-likely iron deficiency anemia secondary to #1  3. Vitamin B-12 deficiency-Low-level confirmed 01/04/2016, maintained on oral vitamin B-12 replacement  4. 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 Carlean Purl 11/01/2015 and 12/10/2015  7. Mild neutropenia/thrombocytopenia secondary to chemotherapy. Progressive thrombocytopenia 11/26/2015. Xeloda dose reduced to 1000 mg twice daily. Counts stable to improved 01/04/2016.  Persistent pancytopenia-secondary to chemotherapy versus vitamin B 12 deficiency  8.Erythema/tenderness at the jejunostomy feeding tube site 09/20/2015, improved  9. Feeding tube removed 01/04/2016  10.  Melanoma left lower abdominal wall status post recent wide excision. We will contact [facility] dermatology for the final pathology report.    Disposition: James [name] appears stable. The plan is to proceed with the final treatment with 5-FU/leucovorin today as scheduled. This will complete the planned course of adjuvant therapy.   The platelet count is stable as compared to 1 week ago. He understands to contact the office with spontaneous bruising/bleeding. He will return for a follow-up CBC in 2 weeks.  He reports a recent diagnosis of melanoma left abdominal wall status post wide excision. We will follow-up on the final pathology.  He will return for a follow-up visit in 3 months. He will contact the office in the interim as outlined above or with any other problems.  Plan reviewed with Dr. Benay Spice.    Ned Card ANP/GNP-BC   02/21/2016  11:07 AM

## 2016-02-21 NOTE — Patient Instructions (Signed)
[facility] Discharge Instructions for Patients Receiving Chemotherapy  Today you received the following chemotherapy agents: Leucovorin/5 FU To help prevent nausea and vomiting after your treatment, we encourage you to take your nausea medication as prescribed.   If you develop nausea and vomiting that is not controlled by your nausea medication, call the clinic.   BELOW ARE SYMPTOMS THAT SHOULD BE REPORTED IMMEDIATELY:  *FEVER GREATER THAN 100.5 F  *CHILLS WITH OR WITHOUT FEVER  NAUSEA AND VOMITING THAT IS NOT CONTROLLED WITH YOUR NAUSEA MEDICATION  *UNUSUAL SHORTNESS OF BREATH  *UNUSUAL BRUISING OR BLEEDING  TENDERNESS IN MOUTH AND THROAT WITH OR WITHOUT PRESENCE OF ULCERS  *URINARY PROBLEMS  *BOWEL PROBLEMS  UNUSUAL RASH Items with * indicate a potential emergency and should be followed up as soon as possible.  Feel free to call the clinic you have any questions or concerns. The clinic phone number is [phone].  Please show the CHEMO ALERT CARD at check-in to the Emergency Department and triage nurse.   

## 2016-02-21 NOTE — Telephone Encounter (Signed)
Gave pt apt & avs °

## 2016-02-21 NOTE — Telephone Encounter (Signed)
Fax received from [facility]. Copy given to Ned Card, NP and copy placed to be scanned.

## 2016-02-22 ENCOUNTER — Other Ambulatory Visit: Payer: Medicare Other

## 2016-02-22 ENCOUNTER — Ambulatory Visit: Payer: Medicare Other

## 2016-02-27 ENCOUNTER — Ambulatory Visit: Payer: Medicare Other

## 2016-02-27 ENCOUNTER — Other Ambulatory Visit: Payer: Medicare Other

## 2016-02-28 ENCOUNTER — Ambulatory Visit (INDEPENDENT_AMBULATORY_CARE_PROVIDER_SITE_OTHER): Payer: Medicare Other | Admitting: Internal Medicine

## 2016-02-28 ENCOUNTER — Encounter: Payer: Self-pay | Admitting: Internal Medicine

## 2016-02-28 VITALS — BP 100/64 | HR 67 | Temp 98.1°F | Resp 16 | Ht 66.5 in | Wt 159.0 lb

## 2016-02-28 DIAGNOSIS — C61 Malignant neoplasm of prostate: Secondary | ICD-10-CM

## 2016-02-28 DIAGNOSIS — Z0000 Encounter for general adult medical examination without abnormal findings: Secondary | ICD-10-CM | POA: Diagnosis not present

## 2016-02-28 NOTE — Patient Instructions (Addendum)
James [name] , Thank you for taking time to come for your Medicare Wellness Visit. I appreciate your ongoing commitment to your health goals. Please review the following plan we discussed and let me know if I can assist you in the future.   These are the goals we discussed: Goals    Continue to increase your activity      This is a list of the screening recommended for you and due dates:  Health Maintenance  Topic Date Due  . Pneumonia vaccines (2 of 2 - PCV13) 12/27/2011  . Flu Shot  05/06/2016  . Colon Cancer Screening  02/22/2017  . Tetanus Vaccine  12/22/2019  . Shingles Vaccine  Completed  .  Hepatitis C: One time screening is recommended by Center for Disease Control  (CDC) for  adults born from [birth year] through 1966.   Completed     All other Health Maintenance issues reviewed.   All recommended immunizations and age-appropriate screenings are up-to-date or discussed.  No immunizations administered today.   Medications reviewed and updated.  No changes recommended at this time.     Health Maintenance, Male A healthy lifestyle and preventative care can promote health and wellness.  Maintain regular health, dental, and eye exams.  Eat a healthy diet. Foods like vegetables, fruits, whole grains, low-fat dairy products, and lean protein foods contain the nutrients you need and are low in calories. Decrease your intake of foods high in solid fats, added sugars, and salt. Get information about a proper diet from your health care provider, if necessary.  Regular physical exercise is one of the most important things you can do for your health. Most adults should get at least 150 minutes of moderate-intensity exercise (any activity that increases your heart rate and causes you to sweat) each week. In addition, most adults need muscle-strengthening exercises on 2 or more days a week.   Maintain a healthy weight. The body mass index (BMI) is a screening tool to identify possible weight  problems. It provides an estimate of body fat based on height and weight. Your health care provider can find your BMI and can help you achieve or maintain a healthy weight. For males 20 years and older:  A BMI below 18.5 is considered underweight.  A BMI of 18.5 to 24.9 is normal.  A BMI of 25 to 29.9 is considered overweight.  A BMI of 30 and above is considered obese.  Maintain normal blood lipids and cholesterol by exercising and minimizing your intake of saturated fat. Eat a balanced diet with plenty of fruits and vegetables. Blood tests for lipids and cholesterol should begin at age 70 and be repeated every 5 years. If your lipid or cholesterol levels are high, you are over age 73, or you are at high risk for heart disease, you may need your cholesterol levels checked more frequently.Ongoing high lipid and cholesterol levels should be treated with medicines if diet and exercise are not working.  If you smoke, find out from your health care provider how to quit. If you do not use tobacco, do not start.  Lung cancer screening is recommended for adults aged 15-80 years who are at high risk for developing lung cancer because of a history of smoking. A yearly low-dose CT scan of the lungs is recommended for people who have at least a 30-pack-year history of smoking and are current smokers or have quit within the past 15 years. A pack year of smoking is smoking an average  of 1 pack of cigarettes a day for 1 year (for example, a 30-pack-year history of smoking could mean smoking 1 pack a day for 30 years or 2 packs a day for 15 years). Yearly screening should continue until the smoker has stopped smoking for at least 15 years. Yearly screening should be stopped for people who develop a health problem that would prevent them from having lung cancer treatment.  If you choose to drink alcohol, do not have more than 2 drinks per day. One drink is considered to be 12 oz (360 mL) of beer, 5 oz (150 mL) of  wine, or 1.5 oz (45 mL) of liquor.  Avoid the use of street drugs. Do not share needles with anyone. Ask for help if you need support or instructions about stopping the use of drugs.  High blood pressure causes heart disease and increases the risk of stroke. High blood pressure is more likely to develop in:  People who have blood pressure in the end of the normal range (100-139/85-89 mm Hg).  People who are overweight or obese.  People who are African American.  If you are 34-25 years of age, have your blood pressure checked every 3-5 years. If you are 46 years of age or older, have your blood pressure checked every year. You should have your blood pressure measured twice--once when you are at a hospital or clinic, and once when you are not at a hospital or clinic. Record the average of the two measurements. To check your blood pressure when you are not at a hospital or clinic, you can use:  An automated blood pressure machine at a pharmacy.  A home blood pressure monitor.  If you are 2-41 years old, ask your health care provider if you should take aspirin to prevent heart disease.  Diabetes screening involves taking a blood sample to check your fasting blood sugar level. This should be done once every 3 years after age 44 if you are at a normal weight and without risk factors for diabetes. Testing should be considered at a younger age or be carried out more frequently if you are overweight and have at least 1 risk factor for diabetes.  Colorectal cancer can be detected and often prevented. Most routine colorectal cancer screening begins at the age of 58 and continues through age 23. However, your health care provider may recommend screening at an earlier age if you have risk factors for colon cancer. On a yearly basis, your health care provider may provide home test kits to check for hidden blood in the stool. A small camera at the end of a tube may be used to directly examine the colon  (sigmoidoscopy or colonoscopy) to detect the earliest forms of colorectal cancer. Talk to your health care provider about this at age 65 when routine screening begins. A direct exam of the colon should be repeated every 5-10 years through age 77, unless early forms of precancerous polyps or small growths are found.  People who are at an increased risk for hepatitis B should be screened for this virus. You are considered at high risk for hepatitis B if:  You were born in a country where hepatitis B occurs often. Talk with your health care provider about which countries are considered high risk.  Your parents were born in a high-risk country and you have not received a shot to protect against hepatitis B (hepatitis B vaccine).  You have HIV or AIDS.  You use needles to  inject street drugs.  You live with, or have sex with, someone who has hepatitis B.  You are a man who has sex with other men (MSM).  You get hemodialysis treatment.  You take certain medicines for conditions like cancer, organ transplantation, and autoimmune conditions.  Hepatitis C blood testing is recommended for all people born from [birth year] through 1966 and any individual with known risk factors for hepatitis C.  Healthy men should no longer receive prostate-specific antigen (PSA) blood tests as part of routine cancer screening. Talk to your health care provider about prostate cancer screening.  Testicular cancer screening is not recommended for adolescents or adult males who have no symptoms. Screening includes self-exam, a health care provider exam, and other screening tests. Consult with your health care provider about any symptoms you have or any concerns you have about testicular cancer.  Practice safe sex. Use condoms and avoid high-risk sexual practices to reduce the spread of sexually transmitted infections (STIs).  You should be screened for STIs, including gonorrhea and chlamydia if:  You are sexually active and  are younger than 24 years.  You are older than 24 years, and your health care provider tells you that you are at risk for this type of infection.  Your sexual activity has changed since you were last screened, and you are at an increased risk for chlamydia or gonorrhea. Ask your health care provider if you are at risk.  If you are at risk of being infected with HIV, it is recommended that you take a prescription medicine daily to prevent HIV infection. This is called pre-exposure prophylaxis (PrEP). You are considered at risk if:  You are a man who has sex with other men (MSM).  You are a heterosexual man who is sexually active with multiple partners.  You take drugs by injection.  You are sexually active with a partner who has HIV.  Talk with your health care provider about whether you are at high risk of being infected with HIV. If you choose to begin PrEP, you should first be tested for HIV. You should then be tested every 3 months for as long as you are taking PrEP.  Use sunscreen. Apply sunscreen liberally and repeatedly throughout the day. You should seek shade when your shadow is shorter than you. Protect yourself by wearing long sleeves, pants, a wide-brimmed hat, and sunglasses year round whenever you are outdoors.  Tell your health care provider of new moles or changes in moles, especially if there is a change in shape or color. Also, tell your health care provider if a mole is larger than the size of a pencil eraser.  A one-time screening for abdominal aortic aneurysm (AAA) and surgical repair of large AAAs by ultrasound is recommended for men aged 56-75 years who are current or former smokers.  Stay current with your vaccines (immunizations).   This information is not intended to replace advice given to you by your health care provider. Make sure you discuss any questions you have with your health care provider.   Document Released: 03/20/2008 Document Revised: 10/13/2014  Document Reviewed: 02/17/2011 Elsevier Interactive Patient Education [employer].

## 2016-02-28 NOTE — Progress Notes (Signed)
Pre visit review using our clinic review tool, if applicable. No additional management support is needed unless otherwise documented below in the visit note. 

## 2016-02-28 NOTE — Progress Notes (Addendum)
Subjective:    Patient ID: James [name], male    DOB: July 20, 1946, 70 y.o.   MRN: [id number]  HPI He is here to establish with a new pcp.     Finished chemo last week for his stomach cancer.  He is following with oncology closely.  Playing some tennis.  Doing some weights for his upper body.  Some walking. He will start PT to help increase his exercise endurance.  He is feeling better each day - increasing energy.  He has no appetite and has to force himself to eat at times.  He has had some difficulty swallowing and has needed his esophagus stretched several times due to strictures from the radiation.    Here for medicare wellness exam.   I have personally reviewed and have noted 1.The patient's medical and social history 2.Their use of alcohol, tobacco or illicit drugs 3.Their current medications and supplements 4.The patient's functional ability including ADL's, fall risks, home safety risks and                 hearing or visual impairment. 5.Diet and physical activities 6.Evidence for depression or mood disorders 7.Care team reviewed and updated  - Oncology: Dr Benay Spice, GI: Dr Carlean Purl,             Derm: Dr Fontaine No, Dr Sarajane Jews, [place] Surgery:  Dr Barry Dienes   Are there smokers in your home (other than you)? No  Risk Factors Exercise: yes, increasing exercise - walking, some tennis, upper body weights Dietary issues discussed: he has difficulty due to recent chemo, 2/3 of stomach being removed due to cancer and having no appetite.  He is eating.  He is limited to certain foods.   Cardiac risk factors: advanced age, hyperlipidemia  Depression Screen  Have you felt down, depressed or hopeless? No  Have you felt little interest or pleasure in doing things?  No  Activities of Daily Living In your present state of health, do you have any difficulty performing the following activities?:  Driving? No Managing  money?  No Feeding yourself? No Getting from bed to chair? No Climbing a flight of stairs? No Preparing food and eating?: No Bathing or showering? No Getting dressed: No Getting to/using the toilet? No Moving around from place to place: No In the past year have you fallen or had a near fall?: yes  -when playing tennis - was still having chemo at that time - his legs gave out on him   Are you sexually active?  No  Do you have more than one partner?  N/A  Hearing Difficulties: Yes - wears hearing aids Do you often ask people to speak up or repeat themselves? yes Do you experience ringing or noises in your ears? No Do you have difficulty understanding soft or whispered voices? yes Vision:              Any change in vision:  no             Up to date with eye exam:  yes Memory:  Do you feel that you have a problem with memory? No  Do you often misplace items? No  Do you feel safe at home?  Yes  Cognitive Testing  Alert, Orientated? Yes  Normal Appearance? Yes  Recall of three objects?  Yes  Can perform simple calculations? Yes  Displays appropriate judgment? Yes  Can read the correct time from a watch face? Yes   Advanced Directives have been  discussed with the patient? Yes - in place  Medications and allergies reviewed with patient and updated if appropriate.  Patient Active Problem List   Diagnosis Date Noted  . Malignant melanoma ([place]) 01/08/2016  . Acute esophagitis   . Dysphagia   . Anastomotic stricture of stomach   . Esophageal stricture   . 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 Gastric cancer ([place]) 06/08/2015  . B12 deficiency 06/05/2015  . Prostate cancer ([place]) 10/24/2014  . Unspecified vitamin D deficiency 06/03/2014  . History of duodenal ulcer 12/16/2013  . History of transfusion 12/16/2013  . Basal cell cancer 09/17/2012  . HEARING DEFICIT 12/21/2009  . NONSPECIFIC ABNORMAL ELECTROCARDIOGRAM  12/21/2009  . FASTING HYPERGLYCEMIA 12/04/2008  . ELEVATED BLOOD PRESSURE WITHOUT DIAGNOSIS OF HYPERTENSION 12/04/2008  . History of colonic polyps 12/04/2008  . HYPERLIPIDEMIA 08/23/2007    Current Outpatient Prescriptions on File Prior to Visit  Medication Sig Dispense Refill  . acetaminophen (TYLENOL) 500 MG tablet Take 500 mg by mouth every 6 (six) hours as needed (For pain.).    [name] cyanocobalamin 1000 MCG tablet Take 1,000 mcg by mouth daily.    . Multiple Vitamin (MULTIVITAMIN WITH MINERALS) TABS tablet Take 1 tablet by mouth daily.    [name] omeprazole (PRILOSEC) 40 MG capsule Take 1 capsule (40 mg total) by mouth daily. 30 MINS BEFORE BREAKFAST/SUPPER 60 capsule 2  . pravastatin (PRAVACHOL) 40 MG tablet Take 1 tablet (40 mg total) by mouth daily. (Patient taking differently: Take 40 mg by mouth at bedtime. ) 90 tablet 3   No current facility-administered medications on file prior to visit.    Past Medical History  Diagnosis Date  . Abnormal EKG     NS ST-T EKG Changes, (-) Nuclear Stress Test 03/2006  . Hyperlipemia   . Duodenal ulcer 1972    transfusion required, 1 unit packed cells  . Hx of basal cell carcinoma   . S/P radiation therapy 11/29/2014 through 01/23/2015     Prostate 7800 cGy in 40 sessions, seminal vesicles 5600 cGy in 40 sessions   . B12 deficiency 06/05/2015    B12 Low NL at 271 methtylmalonic acid high  . Transfusion history     '72 -s/p surgery for duodenal ulcer  . Anemia     iron deficency  . Prostate cancer ([place]) 09/06/14    last radiation 4'16   . Skin cancer, basal cell   . Cardiac murmur     nothing to be concerned with   . Asthma   . Esophageal reflux     no currlently  . Gastric cancer ([place]) 07/18/15    invasive adenocarcinoma  . Allergy   . Gastrostomy tube in place [facility])     intermittent feeding  . Dysphagia     history throat/stomach  cancer "can swallow some, uses more  pureed type foods"  . Melanoma [facility])     Past Surgical History  Procedure Laterality Date  . Trigger finger release Left 1998  . Rotator cuff repair Right     R shoulder, [place]  . Knee arthroscopy Right     Right Knee, [place] [facility]  . Colonoscopy with polypectomy  02/2012    Dr Sharlett Iles  . Prostate biopsy  09/06/14  . Cataract extraction, bilateral Bilateral   . Vasectomy    . Eus N/A 06/14/2015    Procedure: ESOPHAGEAL ENDOSCOPIC ULTRASOUND (EUS) RADIAL;  Surgeon: Milus Banister, MD;  [facility];  Service: Endoscopy;  Laterality: N/A;  . Laparoscopy N/A 07/18/2015    Procedure: LAPAROSCOPY DIAGNOSTIC;  Surgeon: Stark Klein, MD;  [facility];  Service: General;  Laterality: N/A;  . Laparoscopic gastrectomy N/A 07/18/2015    Procedure: SUB TOTAL GASTRECTOMY;  Surgeon: Stark Klein, MD;  [facility];  Service: General;  Laterality: N/A;  . Laparoscopic gastrostomy N/A 07/18/2015    Procedure: FEEDING TUBE PLACEMENT;  Surgeon: Stark Klein, MD;  [facility];  Service: General;  Laterality: N/A;  . Esophagogastroduodenoscopy (egd) with propofol N/A 08/17/2015    Procedure: ESOPHAGOGASTRODUODENOSCOPY (EGD) WITH PROPOFOL;  Surgeon: Gatha Mayer, MD;  Location: [place];  Service: Endoscopy;  Laterality: N/A;  . Esophagogastroduodenoscopy (egd) with propofol N/A 09/12/2015    Procedure: ESOPHAGOGASTRODUODENOSCOPY (EGD) WITH PROPOFOL;  Surgeon: Gatha Mayer, MD;  [facility];  Service: Endoscopy;  Laterality: N/A;  . Balloon dilation N/A 09/12/2015    Procedure: BALLOON DILATION;  Surgeon: Gatha Mayer, MD;  [facility];  Service: Endoscopy;  Laterality: N/A;  . Esophagogastroduodenoscopy (egd) with propofol N/A 09/21/2015    Procedure: ESOPHAGOGASTRODUODENOSCOPY (EGD) WITH PROPOFOL;  Surgeon: Gatha Mayer, MD;  [facility];  Service: Endoscopy;  Laterality: N/A;  . Balloon dilation N/A 09/21/2015    Procedure: BALLOON  DILATION;  Surgeon: Gatha Mayer, MD;  [facility];  Service: Endoscopy;  Laterality: N/A;  . Esophagogastroduodenoscopy (egd) with propofol N/A 10/04/2015    Procedure: ESOPHAGOGASTRODUODENOSCOPY (EGD) WITH PROPOFOL;  Surgeon: Milus Banister, MD;  [facility];  Service: Endoscopy;  Laterality: N/A;  possible stricture  . Balloon dilation N/A 10/04/2015    Procedure: BALLOON DILATION;  Surgeon: Milus Banister, MD;  [facility];  Service: Endoscopy;  Laterality: N/A;  . Esophagogastroduodenoscopy (egd) with propofol N/A 12/10/2015    Procedure: ESOPHAGOGASTRODUODENOSCOPY (EGD) WITH PROPOFOL;  Surgeon: Gatha Mayer, MD;  [facility];  Service: Endoscopy;  Laterality: N/A;  . Balloon dilation N/A 12/10/2015    Procedure: BALLOON DILATION;  Surgeon: Gatha Mayer, MD;  [facility];  Service: Endoscopy;  Laterality: N/A;  . Melanoma excision  02-01-16    abdominal wall  . Esophagogastroduodenoscopy (egd) with propofol N/A 02/06/2016    Procedure: ESOPHAGOGASTRODUODENOSCOPY (EGD) WITH PROPOFOL;  Surgeon: Gatha Mayer, MD;  [facility];  Service: Endoscopy;  Laterality: N/A;  . [place] dilation N/A 02/06/2016    Procedure: SAVORY DILATION;  Surgeon: Gatha Mayer, MD;  [facility];  Service: Endoscopy;  Laterality: N/A;    Social History   Social History  . Marital Status: Married    Spouse Name: N/A  . Number of Children: 2  . Years of Education: N/A   Occupational History  . retired    Social History Main Topics  . Smoking status: Former Smoker -- 0.50 packs/day for 5 years    Types: Cigarettes    Quit date: 10/07/1967  . Smokeless tobacco: Never Used     Comment: smoked [place] , up to < 1 ppd  . Alcohol Use: 0.0 oz/week     Comment:  < 3 / week- none in   month  . Drug Use: No  . Sexual Activity: Not Asked   Other Topics Concern  . None   Social History Narrative   Married and retired - 1 son and 1  daughter    Lives at [place] and active golfer, tennis player   05/30/2015  Family History  Problem Relation Age of Onset  . Diabetes Father   . Heart attack Father 38  . Liver disease Father     Related to Alcohol Use   . Alcohol abuse Mother   . Cirrhosis Mother     died in her 56s  . Multiple myeloma Brother   . Colon cancer Neg Hx   . Stomach cancer Neg Hx   . Stroke Neg Hx   . Diabetes Sister     Review of Systems  Constitutional: Negative for fever and chills.  HENT: Positive for hearing loss and trouble swallowing. Negative for tinnitus.   Eyes: Negative for visual disturbance.  Respiratory: Positive for shortness of breath (exercise - moderate - strenuous - improving). Negative for cough and wheezing.   Cardiovascular: Positive for palpitations (with dumping). Negative for chest pain and leg swelling.  Gastrointestinal: Positive for nausea (with dumping) and abdominal pain (occasinal -relieved with antacid). Negative for diarrhea, constipation and blood in stool.       Occ gerd  Genitourinary: Negative for dysuria, hematuria and difficulty urinating.  Musculoskeletal: Negative for myalgias, back pain and arthralgias.  Neurological: Positive for light-headedness (changes in position). Negative for headaches.  Psychiatric/Behavioral: Negative for dysphoric mood.       Objective:   Filed Vitals:   02/28/16 0827  BP: 100/64  Pulse: 67  Temp: 98.1 F (36.7 C)  Resp: 16   Filed Weights   02/28/16 0827  Weight: 159 lb (72.122 kg)   Body mass index is 25.28 kg/(m^2).   Physical Exam Constitutional: He appears well-developed and well-nourished. No distress.  HENT:  Head: Normocephalic and atraumatic.  Right Ear: External ear normal.  Left Ear: External ear normal.  Mouth/Throat: Oropharynx is clear and moist.  Normal ear canals and TM b/l  Eyes: Conjunctivae and EOM are normal.  Neck: Neck supple. No tracheal deviation present. No thyromegaly  present.  No carotid bruit  Cardiovascular: Normal rate, regular rhythm, normal heart sounds and intact distal pulses.   No murmur heard. Pulmonary/Chest: Effort normal and breath sounds normal. No respiratory distress. He has no wheezes. He has no rales.  Abdominal: Soft. Healed scars from gastrectomy, j-tube and melanoma,  He exhibits no distension. There is no tenderness.  Genitourinary: deferred  Musculoskeletal: He exhibits no edema.  Lymphadenopathy:    He has no cervical adenopathy.  Skin: Skin is warm and dry. He is not diaphoretic.  Psychiatric: He has a normal mood and affect. His behavior is normal.         Assessment & Plan:   Wellness Exam: Immunizations prevnar due - given today, other immunizations up to date Colonoscopy  Up to date Eye exam  Up to date Hearing loss - yes, wears hearing aids Memory concerns/difficulties - none Independent of ADLs -  Fully independent   Patient received copy of preventative screening tests/immunizations recommended for the next 5-10 years.   See Problem List for Assessment and Plan of chronic medical problems.    Binnie Rail, MD

## 2016-03-05 ENCOUNTER — Ambulatory Visit: Payer: Medicare Other | Admitting: Nurse Practitioner

## 2016-03-05 ENCOUNTER — Ambulatory Visit: Payer: Medicare Other

## 2016-03-05 ENCOUNTER — Other Ambulatory Visit: Payer: Medicare Other

## 2016-03-06 ENCOUNTER — Other Ambulatory Visit: Payer: Self-pay | Admitting: Nurse Practitioner

## 2016-03-06 ENCOUNTER — Telehealth: Payer: Self-pay | Admitting: Nurse Practitioner

## 2016-03-06 ENCOUNTER — Other Ambulatory Visit (HOSPITAL_BASED_OUTPATIENT_CLINIC_OR_DEPARTMENT_OTHER): Payer: Medicare Other

## 2016-03-06 DIAGNOSIS — C161 Malignant neoplasm of fundus of stomach: Secondary | ICD-10-CM

## 2016-03-06 DIAGNOSIS — C165 Malignant neoplasm of lesser curvature of stomach, unspecified: Secondary | ICD-10-CM

## 2016-03-06 LAB — CBC WITH DIFFERENTIAL/PLATELET
BASO%: 0.5 % (ref 0.0–2.0)
BASOS ABS: 0 10*3/uL (ref 0.0–0.1)
EOS ABS: 0.1 10*3/uL (ref 0.0–0.5)
EOS%: 3.5 % (ref 0.0–7.0)
HEMATOCRIT: 26.8 % — AB (ref 38.4–49.9)
HEMOGLOBIN: 9.2 g/dL — AB (ref 13.0–17.1)
LYMPH%: 25.4 % (ref 14.0–49.0)
MCH: 38.8 pg — AB (ref 27.2–33.4)
MCHC: 34.4 g/dL (ref 32.0–36.0)
MCV: 112.7 fL — ABNORMAL HIGH (ref 79.3–98.0)
MONO#: 0.3 10*3/uL (ref 0.1–0.9)
MONO%: 12.9 % (ref 0.0–14.0)
NEUT#: 1.4 10*3/uL — ABNORMAL LOW (ref 1.5–6.5)
NEUT%: 57.7 % (ref 39.0–75.0)
PLATELETS: 66 10*3/uL — AB (ref 140–400)
RBC: 2.38 10*6/uL — ABNORMAL LOW (ref 4.20–5.82)
RDW: 20.2 % — ABNORMAL HIGH (ref 11.0–14.6)
WBC: 2.4 10*3/uL — AB (ref 4.0–10.3)
lymph#: 0.6 10*3/uL — ABNORMAL LOW (ref 0.9–3.3)

## 2016-03-06 NOTE — Telephone Encounter (Signed)
cld & spoke to pt and gave pt time & date of appt  for 6/15@10 :15

## 2016-03-10 ENCOUNTER — Encounter: Payer: Self-pay | Admitting: Physical Therapy

## 2016-03-10 ENCOUNTER — Ambulatory Visit: Payer: Medicare Other | Attending: General Surgery | Admitting: Physical Therapy

## 2016-03-10 DIAGNOSIS — M6281 Muscle weakness (generalized): Secondary | ICD-10-CM | POA: Diagnosis not present

## 2016-03-10 DIAGNOSIS — R262 Difficulty in walking, not elsewhere classified: Secondary | ICD-10-CM | POA: Insufficient documentation

## 2016-03-10 NOTE — Therapy (Signed)
[place], [place], [address] Phone: [phone]   Fax:  [phone]  Physical Therapy Evaluation  Patient Details  Name: James [name] MRN: [id number] Date of Birth: [birth year] Referring Provider: Barry Dienes  Encounter Date: 03/10/2016      PT End of Session - 03/10/16 0953    Visit Number 1   Date for PT Re-Evaluation 05/10/16   PT Start Time 0931   PT Stop Time 1015   PT Time Calculation (min) 44 min   Activity Tolerance Patient tolerated treatment well   Behavior During Therapy [facility] for tasks assessed/performed      Past Medical History  Diagnosis Date  . Abnormal EKG     NS ST-T EKG Changes, (-) Nuclear Stress Test 03/2006  . Hyperlipemia   . Duodenal ulcer 1972    transfusion required, 1 unit packed cells  . Hx of basal cell carcinoma   . S/P radiation therapy 11/29/2014 through 01/23/2015     Prostate 7800 cGy in 40 sessions, seminal vesicles 5600 cGy in 40 sessions   . B12 deficiency 06/05/2015    B12 Low NL at 271 methtylmalonic acid high  . Transfusion history     '72 -s/p surgery for duodenal ulcer  . Anemia     iron deficency  . Prostate cancer ([place]) 09/06/14    last radiation 4'16   . Skin cancer, basal cell   . Cardiac murmur     nothing to be concerned with   . Asthma   . Esophageal reflux     no currlently  . Gastric cancer ([place]) 07/18/15    invasive adenocarcinoma  . Allergy   . Gastrostomy tube in place [facility])     intermittent feeding  . Dysphagia     history throat/stomach  cancer "can swallow some, uses more pureed type foods"  . Melanoma [facility])     Past Surgical History  Procedure Laterality Date  . Trigger finger release Left 1998  . Rotator cuff repair Right     R shoulder, [place]  . Knee arthroscopy Right     Right Knee, [place] [facility]  . Colonoscopy with polypectomy  02/2012    Dr  Sharlett Iles  . Prostate biopsy  09/06/14  . Cataract extraction, bilateral Bilateral   . Vasectomy    . Eus N/A 06/14/2015    Procedure: ESOPHAGEAL ENDOSCOPIC ULTRASOUND (EUS) RADIAL;  Surgeon: Milus Banister, MD;  [facility];  Service: Endoscopy;  Laterality: N/A;  . Laparoscopy N/A 07/18/2015    Procedure: LAPAROSCOPY DIAGNOSTIC;  Surgeon: Stark Klein, MD;  [facility];  Service: General;  Laterality: N/A;  . Laparoscopic gastrectomy N/A 07/18/2015    Procedure: SUB TOTAL GASTRECTOMY;  Surgeon: Stark Klein, MD;  [facility];  Service: General;  Laterality: N/A;  . Laparoscopic gastrostomy N/A 07/18/2015    Procedure: FEEDING TUBE PLACEMENT;  Surgeon: Stark Klein, MD;  [facility];  Service: General;  Laterality: N/A;  . Esophagogastroduodenoscopy (egd) with propofol N/A 08/17/2015    Procedure: ESOPHAGOGASTRODUODENOSCOPY (EGD) WITH PROPOFOL;  Surgeon: Gatha Mayer, MD;  Location: [place];  Service: Endoscopy;  Laterality: N/A;  . Esophagogastroduodenoscopy (egd) with propofol N/A 09/12/2015    Procedure: ESOPHAGOGASTRODUODENOSCOPY (EGD) WITH PROPOFOL;  Surgeon: Gatha Mayer, MD;  [facility];  Service: Endoscopy;  Laterality: N/A;  . Balloon dilation N/A 09/12/2015    Procedure: BALLOON DILATION;  Surgeon: Gatha Mayer, MD;  [facility];  Service: Endoscopy;  Laterality: N/A;  . Esophagogastroduodenoscopy (egd) with propofol N/A 09/21/2015    Procedure: ESOPHAGOGASTRODUODENOSCOPY (EGD) WITH PROPOFOL;  Surgeon: Gatha Mayer, MD;  [facility];  Service: Endoscopy;  Laterality: N/A;  . Balloon dilation N/A 09/21/2015    Procedure: BALLOON DILATION;  Surgeon: Gatha Mayer, MD;  [facility];  Service: Endoscopy;  Laterality: N/A;  . Esophagogastroduodenoscopy (egd) with propofol N/A 10/04/2015    Procedure: ESOPHAGOGASTRODUODENOSCOPY (EGD) WITH PROPOFOL;  Surgeon: Milus Banister, MD;  [facility];   Service: Endoscopy;  Laterality: N/A;  possible stricture  . Balloon dilation N/A 10/04/2015    Procedure: BALLOON DILATION;  Surgeon: Milus Banister, MD;  [facility];  Service: Endoscopy;  Laterality: N/A;  . Esophagogastroduodenoscopy (egd) with propofol N/A 12/10/2015    Procedure: ESOPHAGOGASTRODUODENOSCOPY (EGD) WITH PROPOFOL;  Surgeon: Gatha Mayer, MD;  [facility];  Service: Endoscopy;  Laterality: N/A;  . Balloon dilation N/A 12/10/2015    Procedure: BALLOON DILATION;  Surgeon: Gatha Mayer, MD;  [facility];  Service: Endoscopy;  Laterality: N/A;  . Melanoma excision  02-01-16    abdominal wall  . Esophagogastroduodenoscopy (egd) with propofol N/A 02/06/2016    Procedure: ESOPHAGOGASTRODUODENOSCOPY (EGD) WITH PROPOFOL;  Surgeon: Gatha Mayer, MD;  [facility];  Service: Endoscopy;  Laterality: N/A;  . [place] dilation N/A 02/06/2016    Procedure: SAVORY DILATION;  Surgeon: Gatha Mayer, MD;  [facility];  Service: Endoscopy;  Laterality: N/A;    There were no vitals filed for this visit.       Subjective Assessment - 03/10/16 0935    Subjective Patient has undergone treatment for prostate and stomach cancer, had chemo and radiation with a feeding tube, all of this ended a few weeks ago.  Now decontioned.  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  He has lost about 50 # over the past year.   Patient Stated Goals play golf and tennis again without difficulty   Currently in Pain? No/denies            [facility] PT Assessment - 03/10/16 0001    Assessment   Medical Diagnosis debility   Referring Provider [place]   Onset Date/Surgical Date 02/08/16   Prior Therapy no   Precautions   Precautions None   Balance Screen   Has the patient fallen in the past 6 months No   Has the patient had a decrease in activity level because of a fear of falling?  No   Is the patient reluctant to leave their home  because of a fear of falling?  No   Home Environment   Additional Comments has stairs, was doing his own yardwork   Prior Function   Level of Independence Independent   Vocation Retired   Leisure golf 1x/week, tennis 2x/week   Posture/Postural Control   Posture Comments fwd head, rounded shoulders   ROM / Strength   AROM / PROM / Strength AROM;Strength   AROM   Overall AROM Comments ROM is WNL's    Strength   Overall Strength Comments UE's 4-/5, 4-/5 for the LE's   Flexibility   Soft Tissue Assessment /Muscle Length --  Tightness of the HS   Palpation   Palpation comment good mm contractions   Ambulation/Gait   Gait Comments no device, normal gait, slouched posture  [place] [facility] PT Treatment/Exercise - 03/10/16 0001    High Level Balance   High Level Balance Comments patient with difficulty standing on airex, difficulty with tandem stance and some difficulty with eyes closed   Exercises   Exercises Knee/Hip   Knee/Hip Exercises: Aerobic   Nustep Level 5 x 5 minutes   Knee/Hip Exercises: Machines for Strengthening   Cybex Knee Extension 5# 2x10   Cybex Knee Flexion 25# 2x10   Other Machine seated rows 20# 2x10, lats 20# 2x10                  PT Short Term Goals - 03/10/16 [id number]    PT SHORT TERM GOAL #1   Title independent with initial HEP   Time 2   Period Weeks   Status New           PT Long Term Goals - 03/10/16 0954    PT LONG TERM GOAL #1   Title return to playing tennis 2x/week   Time 8   Period Weeks   Status New   PT LONG TERM GOAL #2   Title ncrease UE and LE strength to 4/5   Time 8   Period Weeks   Status New   PT LONG TERM GOAL #3   Title tolerate 60 minutes of exercise   Time 8   Period Weeks   Status New   PT LONG TERM GOAL #4   Title be able to do single leg stance > 15 seconds   Time 8   Period Weeks   Status New             Patient will benefit from skilled therapeutic intervention in  order to improve the following deficits and impairments:     Visit Diagnosis: Muscle weakness (generalized) - Plan: PT plan of care cert/re-cert  Difficulty in walking, not elsewhere classified - Plan: PT plan of care cert/re-cert      G-Codes - [id number]    Functional Assessment Tool Used foto  53% limitation   Functional Limitation Mobility: Walking and moving around   Mobility: Walking and Moving Around Current Status [id number]) At least 40 percent but less than 60 percent impaired, limited or restricted   Mobility: Walking and Moving Around Goal Status [phone]) At least 40 percent but less than 60 percent impaired, limited or restricted       Problem List Patient Active Problem List   Diagnosis Date Noted  . Malignant melanoma ([place]) 01/08/2016  . Acute esophagitis   . Dysphagia   . Anastomotic stricture of stomach   . Esophageal stricture   . 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 Gastric cancer ([place]) 06/08/2015  . B12 deficiency 06/05/2015  . Prostate cancer ([place]) 10/24/2014  . Unspecified vitamin D deficiency 06/03/2014  . History of duodenal ulcer 12/16/2013  . History of transfusion 12/16/2013  . Basal cell cancer 09/17/2012  . HEARING DEFICIT 12/21/2009  . NONSPECIFIC ABNORMAL ELECTROCARDIOGRAM 12/21/2009  . FASTING HYPERGLYCEMIA 12/04/2008  . History of colonic polyps 12/04/2008  . HYPERLIPIDEMIA 08/23/2007    Sumner Boast., PT 03/10/2016, 10:39 AM  [place] [address] [address] [place], [place], [address] Phone: [phone]   Fax:  [phone]  Name: EDEN MANETTA MRN: [id number] Date of Birth: 11-Mar-1946

## 2016-03-13 ENCOUNTER — Ambulatory Visit: Payer: Medicare Other | Admitting: Physical Therapy

## 2016-03-13 ENCOUNTER — Encounter: Payer: Self-pay | Admitting: Physical Therapy

## 2016-03-13 DIAGNOSIS — R262 Difficulty in walking, not elsewhere classified: Secondary | ICD-10-CM

## 2016-03-13 DIAGNOSIS — M6281 Muscle weakness (generalized): Secondary | ICD-10-CM | POA: Diagnosis not present

## 2016-03-13 NOTE — Therapy (Signed)
[place], [place], [address] Phone: [phone]   Fax:  [phone]  Physical Therapy Treatment  Patient Details  Name: James [name] MRN: [id number] Date of Birth: 1946-03-29 Referring Provider: Barry Dienes  Encounter Date: 03/13/2016      PT End of Session - 03/13/16 0926    Visit Number 2   Date for PT Re-Evaluation 05/10/16   PT Start Time 0841   PT Stop Time 0926   PT Time Calculation (min) 45 min   Activity Tolerance Patient tolerated treatment well   Behavior During Therapy [facility] for tasks assessed/performed      Past Medical History  Diagnosis Date  . Abnormal EKG     NS ST-T EKG Changes, (-) Nuclear Stress Test 03/2006  . Hyperlipemia   . Duodenal ulcer 1972    transfusion required, 1 unit packed cells  . Hx of basal cell carcinoma   . S/P radiation therapy 11/29/2014 through 01/23/2015     Prostate 7800 cGy in 40 sessions, seminal vesicles 5600 cGy in 40 sessions   . B12 deficiency 06/05/2015    B12 Low NL at 271 methtylmalonic acid high  . Transfusion history     '72 -s/p surgery for duodenal ulcer  . Anemia     iron deficency  . Prostate cancer ([place]) 09/06/14    last radiation 4'16   . Skin cancer, basal cell   . Cardiac murmur     nothing to be concerned with   . Asthma   . Esophageal reflux     no currlently  . Gastric cancer ([place]) 07/18/15    invasive adenocarcinoma  . Allergy   . Gastrostomy tube in place [facility])     intermittent feeding  . Dysphagia     history throat/stomach  cancer "can swallow some, uses more pureed type foods"  . Melanoma [facility])     Past Surgical History  Procedure Laterality Date  . Trigger finger release Left 1998  . Rotator cuff repair Right     R shoulder, [place]  . Knee arthroscopy Right     Right Knee, [place] [facility]  . Colonoscopy with polypectomy  02/2012    Dr  Sharlett Iles  . Prostate biopsy  09/06/14  . Cataract extraction, bilateral Bilateral   . Vasectomy    . Eus N/A 06/14/2015    Procedure: ESOPHAGEAL ENDOSCOPIC ULTRASOUND (EUS) RADIAL;  Surgeon: Milus Banister, MD;  [facility];  Service: Endoscopy;  Laterality: N/A;  . Laparoscopy N/A 07/18/2015    Procedure: LAPAROSCOPY DIAGNOSTIC;  Surgeon: Stark Klein, MD;  [facility];  Service: General;  Laterality: N/A;  . Laparoscopic gastrectomy N/A 07/18/2015    Procedure: SUB TOTAL GASTRECTOMY;  Surgeon: Stark Klein, MD;  [facility];  Service: General;  Laterality: N/A;  . Laparoscopic gastrostomy N/A 07/18/2015    Procedure: FEEDING TUBE PLACEMENT;  Surgeon: Stark Klein, MD;  [facility];  Service: General;  Laterality: N/A;  . Esophagogastroduodenoscopy (egd) with propofol N/A 08/17/2015    Procedure: ESOPHAGOGASTRODUODENOSCOPY (EGD) WITH PROPOFOL;  Surgeon: Gatha Mayer, MD;  Location: [place];  Service: Endoscopy;  Laterality: N/A;  . Esophagogastroduodenoscopy (egd) with propofol N/A 09/12/2015    Procedure: ESOPHAGOGASTRODUODENOSCOPY (EGD) WITH PROPOFOL;  Surgeon: Gatha Mayer, MD;  [facility];  Service: Endoscopy;  Laterality: N/A;  . Balloon dilation N/A 09/12/2015    Procedure: BALLOON DILATION;  Surgeon: Gatha Mayer, MD;  [facility];  Service: Endoscopy;  Laterality: N/A;  . Esophagogastroduodenoscopy (egd) with propofol N/A 09/21/2015    Procedure: ESOPHAGOGASTRODUODENOSCOPY (EGD) WITH PROPOFOL;  Surgeon: Gatha Mayer, MD;  [facility];  Service: Endoscopy;  Laterality: N/A;  . Balloon dilation N/A 09/21/2015    Procedure: BALLOON DILATION;  Surgeon: Gatha Mayer, MD;  [facility];  Service: Endoscopy;  Laterality: N/A;  . Esophagogastroduodenoscopy (egd) with propofol N/A 10/04/2015    Procedure: ESOPHAGOGASTRODUODENOSCOPY (EGD) WITH PROPOFOL;  Surgeon: Milus Banister, MD;  [facility];   Service: Endoscopy;  Laterality: N/A;  possible stricture  . Balloon dilation N/A 10/04/2015    Procedure: BALLOON DILATION;  Surgeon: Milus Banister, MD;  [facility];  Service: Endoscopy;  Laterality: N/A;  . Esophagogastroduodenoscopy (egd) with propofol N/A 12/10/2015    Procedure: ESOPHAGOGASTRODUODENOSCOPY (EGD) WITH PROPOFOL;  Surgeon: Gatha Mayer, MD;  [facility];  Service: Endoscopy;  Laterality: N/A;  . Balloon dilation N/A 12/10/2015    Procedure: BALLOON DILATION;  Surgeon: Gatha Mayer, MD;  [facility];  Service: Endoscopy;  Laterality: N/A;  . Melanoma excision  02-01-16    abdominal wall  . Esophagogastroduodenoscopy (egd) with propofol N/A 02/06/2016    Procedure: ESOPHAGOGASTRODUODENOSCOPY (EGD) WITH PROPOFOL;  Surgeon: Gatha Mayer, MD;  [facility];  Service: Endoscopy;  Laterality: N/A;  . [place] dilation N/A 02/06/2016    Procedure: SAVORY DILATION;  Surgeon: Gatha Mayer, MD;  [facility];  Service: Endoscopy;  Laterality: N/A;    There were no vitals filed for this visit.      Subjective Assessment - 03/13/16 0842    Subjective Patient reports that he was not sore, a little tired but no problems   Currently in Pain? No/denies                         [facility] Adult PT Treatment/Exercise - 03/13/16 0001    Ambulation/Gait   Gait Comments gait around the building maintaining pace with 5# for punches, biceps curls, and overhead punches   High Level Balance   High Level Balance Comments resisted gait all direction, on airex with tband to simulate tennis swings   Knee/Hip Exercises: Aerobic   Nustep Level 5 x 6 minutes   Other Aerobic UBE constant work 35 wats x 2 minutes   Knee/Hip Exercises: Machines for Strengthening   Cybex Knee Extension 5# 2x15   Cybex Knee Flexion 25# 2x15   Cybex Leg Press 20# 2x15   Other Machine seated rows 25# 2x10, lats 25# 2x10                  PT  Short Term Goals - 03/10/16 0954    PT SHORT TERM GOAL #1   Title independent with initial HEP   Time 2   Period Weeks   Status New           PT Long Term Goals - 03/10/16 [id number]    PT LONG TERM GOAL #1   Title return to playing tennis 2x/week   Time 8   Period Weeks   Status New   PT LONG TERM GOAL #2   Title ncrease UE and LE strength to 4/5   Time 8   Period Weeks   Status New   PT LONG TERM GOAL #3   Title tolerate 60 minutes of exercise   Time 8   Period Weeks  Status New   PT LONG TERM GOAL #4   Title be able to do single leg stance > 15 seconds   Time 8   Period Weeks   Status New               Plan - 03/13/16 1137    Clinical Impression Statement Patient with prostate and stomach cancer, he underwent chemo and radiation which has debilitated him, 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  He does have some balance issues   Rehab Potential Good   PT Frequency 2x / week   PT Duration 8 weeks   PT Treatment/Interventions ADLs/Self Care Home Management;Functional mobility training;Therapeutic activities;Therapeutic exercise;Manual techniques;Patient/family education;Balance training   PT Next Visit Plan slowly add exercises for strength, balance and function   Consulted and Agree with Plan of Care Patient      Patient will benefit from skilled therapeutic intervention in order to improve the following deficits and impairments:  Abnormal gait, Cardiopulmonary status limiting activity, Decreased activity tolerance, Decreased mobility, Decreased endurance, Decreased range of motion, Difficulty walking  Visit Diagnosis: Muscle weakness (generalized)  Difficulty in walking, not elsewhere classified     Problem List Patient Active Problem List   Diagnosis Date Noted  . Malignant melanoma ([place]) 01/08/2016  . Acute esophagitis   . Dysphagia   . Anastomotic stricture of  stomach   . Esophageal stricture   . 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 Gastric cancer ([place]) 06/08/2015  . B12 deficiency 06/05/2015  . Prostate cancer ([place]) 10/24/2014  . Unspecified vitamin D deficiency 06/03/2014  . History of duodenal ulcer 12/16/2013  . History of transfusion 12/16/2013  . Basal cell cancer 09/17/2012  . HEARING DEFICIT 12/21/2009  . NONSPECIFIC ABNORMAL ELECTROCARDIOGRAM 12/21/2009  . FASTING HYPERGLYCEMIA 12/04/2008  . History of colonic polyps 12/04/2008  . HYPERLIPIDEMIA 08/23/2007    Sumner Boast., PT 03/13/2016, 11:50 AM  [place] [address] [place], [place], [address] Phone: [phone]   Fax:  [phone]  Name: James [name] MRN: [id number] Date of Birth: 1945/10/21

## 2016-03-18 ENCOUNTER — Ambulatory Visit: Payer: Medicare Other | Admitting: Physical Therapy

## 2016-03-18 ENCOUNTER — Encounter: Payer: Self-pay | Admitting: Physical Therapy

## 2016-03-18 DIAGNOSIS — R262 Difficulty in walking, not elsewhere classified: Secondary | ICD-10-CM | POA: Diagnosis not present

## 2016-03-18 DIAGNOSIS — M6281 Muscle weakness (generalized): Secondary | ICD-10-CM

## 2016-03-18 NOTE — Therapy (Signed)
[place], [place], [address] Phone: [phone]   Fax:  [phone]  Physical Therapy Treatment  Patient Details  Name: James [name] MRN: [id number] Date of Birth: March 06, 1946 Referring Provider: Barry Dienes  Encounter Date: 03/18/2016      PT End of Session - 03/18/16 0940    Visit Number 3   Date for PT Re-Evaluation 05/10/16   PT Start Time 0841   PT Stop Time 0928   PT Time Calculation (min) 47 min   Activity Tolerance Patient tolerated treatment well   Behavior During Therapy [facility] for tasks assessed/performed      Past Medical History  Diagnosis Date  . Abnormal EKG     NS ST-T EKG Changes, (-) Nuclear Stress Test 03/2006  . Hyperlipemia   . Duodenal ulcer 1972    transfusion required, 1 unit packed cells  . Hx of basal cell carcinoma   . S/P radiation therapy 11/29/2014 through 01/23/2015     Prostate 7800 cGy in 40 sessions, seminal vesicles 5600 cGy in 40 sessions   . B12 deficiency 06/05/2015    B12 Low NL at 271 methtylmalonic acid high  . Transfusion history     '72 -s/p surgery for duodenal ulcer  . Anemia     iron deficency  . Prostate cancer ([place]) 09/06/14    last radiation 4'16   . Skin cancer, basal cell   . Cardiac murmur     nothing to be concerned with   . Asthma   . Esophageal reflux     no currlently  . Gastric cancer ([place]) 07/18/15    invasive adenocarcinoma  . Allergy   . Gastrostomy tube in place [facility])     intermittent feeding  . Dysphagia     history throat/stomach  cancer "can swallow some, uses more pureed type foods"  . Melanoma [facility])     Past Surgical History  Procedure Laterality Date  . Trigger finger release Left 1998  . Rotator cuff repair Right     R shoulder, [place]  . Knee arthroscopy Right     Right Knee, [place] [facility]  . Colonoscopy with polypectomy  02/2012    Dr  Sharlett Iles  . Prostate biopsy  09/06/14  . Cataract extraction, bilateral Bilateral   . Vasectomy    . Eus N/A 06/14/2015    Procedure: ESOPHAGEAL ENDOSCOPIC ULTRASOUND (EUS) RADIAL;  Surgeon: Milus Banister, MD;  [facility];  Service: Endoscopy;  Laterality: N/A;  . Laparoscopy N/A 07/18/2015    Procedure: LAPAROSCOPY DIAGNOSTIC;  Surgeon: Stark Klein, MD;  [facility];  Service: General;  Laterality: N/A;  . Laparoscopic gastrectomy N/A 07/18/2015    Procedure: SUB TOTAL GASTRECTOMY;  Surgeon: Stark Klein, MD;  [facility];  Service: General;  Laterality: N/A;  . Laparoscopic gastrostomy N/A 07/18/2015    Procedure: FEEDING TUBE PLACEMENT;  Surgeon: Stark Klein, MD;  [facility];  Service: General;  Laterality: N/A;  . Esophagogastroduodenoscopy (egd) with propofol N/A 08/17/2015    Procedure: ESOPHAGOGASTRODUODENOSCOPY (EGD) WITH PROPOFOL;  Surgeon: Gatha Mayer, MD;  Location: [place];  Service: Endoscopy;  Laterality: N/A;  . Esophagogastroduodenoscopy (egd) with propofol N/A 09/12/2015    Procedure: ESOPHAGOGASTRODUODENOSCOPY (EGD) WITH PROPOFOL;  Surgeon: Gatha Mayer, MD;  [facility];  Service: Endoscopy;  Laterality: N/A;  . Balloon dilation N/A 09/12/2015    Procedure: BALLOON DILATION;  Surgeon: Gatha Mayer, MD;  [facility];  Service: Endoscopy;  Laterality: N/A;  . Esophagogastroduodenoscopy (egd) with propofol N/A 09/21/2015    Procedure: ESOPHAGOGASTRODUODENOSCOPY (EGD) WITH PROPOFOL;  Surgeon: Gatha Mayer, MD;  [facility];  Service: Endoscopy;  Laterality: N/A;  . Balloon dilation N/A 09/21/2015    Procedure: BALLOON DILATION;  Surgeon: Gatha Mayer, MD;  [facility];  Service: Endoscopy;  Laterality: N/A;  . Esophagogastroduodenoscopy (egd) with propofol N/A 10/04/2015    Procedure: ESOPHAGOGASTRODUODENOSCOPY (EGD) WITH PROPOFOL;  Surgeon: Milus Banister, MD;  [facility];   Service: Endoscopy;  Laterality: N/A;  possible stricture  . Balloon dilation N/A 10/04/2015    Procedure: BALLOON DILATION;  Surgeon: Milus Banister, MD;  [facility];  Service: Endoscopy;  Laterality: N/A;  . Esophagogastroduodenoscopy (egd) with propofol N/A 12/10/2015    Procedure: ESOPHAGOGASTRODUODENOSCOPY (EGD) WITH PROPOFOL;  Surgeon: Gatha Mayer, MD;  [facility];  Service: Endoscopy;  Laterality: N/A;  . Balloon dilation N/A 12/10/2015    Procedure: BALLOON DILATION;  Surgeon: Gatha Mayer, MD;  [facility];  Service: Endoscopy;  Laterality: N/A;  . Melanoma excision  02-01-16    abdominal wall  . Esophagogastroduodenoscopy (egd) with propofol N/A 02/06/2016    Procedure: ESOPHAGOGASTRODUODENOSCOPY (EGD) WITH PROPOFOL;  Surgeon: Gatha Mayer, MD;  [facility];  Service: Endoscopy;  Laterality: N/A;  . Savory dilation N/A 02/06/2016    Procedure: SAVORY DILATION;  Surgeon: Gatha Mayer, MD;  [facility];  Service: Endoscopy;  Laterality: N/A;    There were no vitals filed for this visit.      Subjective Assessment - 03/18/16 0848    Subjective I have been doing pretty good.  when I am active I do get worn down easy though   Currently in Pain? No/denies                         [facility] Adult PT Treatment/Exercise - 03/18/16 0001    Ambulation/Gait   Gait Comments gait around the building maintaining pace with 5# for punches, biceps curls, and overhead punches   High Level Balance   High Level Balance Comments resisted gait all direction, on airex with tband to simulate tennis swings, airex standing ball toss, narrow BOS, SLS on airex   Knee/Hip Exercises: Aerobic   Nustep Level 5 x 6 minutes   Knee/Hip Exercises: Machines for Strengthening   Cybex Knee Extension 5# 2x15   Cybex Knee Flexion 25# 2x15   Cybex Leg Press 40# 2x10, 20# single legs x10 each   Hip Cybex 5# hips extension and abduction 2x10 each    Other Machine seated rows 25# 2x10, lats 25# 2x10, 35# straight arm pull downs                  PT Short Term Goals - 03/18/16 0942    PT SHORT TERM GOAL #1   Title independent with initial HEP   Status Achieved           PT Long Term Goals - 03/10/16 0954    PT LONG TERM GOAL #1   Title return to playing tennis 2x/week   Time 8   Period Weeks   Status New   PT LONG TERM GOAL #2   Title ncrease UE and LE strength to 4/5   Time 8   Period Weeks   Status New   PT LONG TERM GOAL #3   Title  tolerate 60 minutes of exercise   Time 8   Period Weeks   Status New   PT LONG TERM GOAL #4   Title be able to do single leg stance > 15 seconds   Time 8   Period Weeks   Status New               Plan - 03/18/16 0941    Clinical Impression Statement Patient doing very well, reports he feels that this is making him stronger.  He reports that he is trying to walk more and be more active   PT Next Visit Plan slowly add exercises for strength, balance and function   Consulted and Agree with Plan of Care Patient      Patient will benefit from skilled therapeutic intervention in order to improve the following deficits and impairments:  Abnormal gait, Cardiopulmonary status limiting activity, Decreased activity tolerance, Decreased mobility, Decreased endurance, Decreased range of motion, Difficulty walking  Visit Diagnosis: Muscle weakness (generalized)  Difficulty in walking, not elsewhere classified     Problem List Patient Active Problem List   Diagnosis Date Noted  . Malignant melanoma ([place]) 01/08/2016  . Acute esophagitis   . Dysphagia   . Anastomotic stricture of stomach   . Esophageal stricture   . 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 Gastric cancer ([place]) 06/08/2015  . B12 deficiency 06/05/2015  . Prostate cancer ([place]) 10/24/2014  . Unspecified vitamin D deficiency 06/03/2014  . History  of duodenal ulcer 12/16/2013  . History of transfusion 12/16/2013  . Basal cell cancer 09/17/2012  . HEARING DEFICIT 12/21/2009  . NONSPECIFIC ABNORMAL ELECTROCARDIOGRAM 12/21/2009  . FASTING HYPERGLYCEMIA 12/04/2008  . History of colonic polyps 12/04/2008  . HYPERLIPIDEMIA 08/23/2007    Sumner Boast., PT 03/18/2016, 9:42 AM  [facility] [id number] [place], [place], [address] Phone: [phone]   Fax:  [phone]  Name: James [name] MRN: [id number] Date of Birth: [birth year]

## 2016-03-20 ENCOUNTER — Other Ambulatory Visit (HOSPITAL_BASED_OUTPATIENT_CLINIC_OR_DEPARTMENT_OTHER): Payer: Medicare Other

## 2016-03-20 DIAGNOSIS — C165 Malignant neoplasm of lesser curvature of stomach, unspecified: Secondary | ICD-10-CM

## 2016-03-20 DIAGNOSIS — C161 Malignant neoplasm of fundus of stomach: Secondary | ICD-10-CM | POA: Diagnosis present

## 2016-03-20 LAB — CBC WITH DIFFERENTIAL/PLATELET
BASO%: 0.6 % (ref 0.0–2.0)
Basophils Absolute: 0 10*3/uL (ref 0.0–0.1)
EOS ABS: 0.1 10*3/uL (ref 0.0–0.5)
EOS%: 3 % (ref 0.0–7.0)
HCT: 30.8 % — ABNORMAL LOW (ref 38.4–49.9)
HEMOGLOBIN: 10.5 g/dL — AB (ref 13.0–17.1)
LYMPH#: 0.7 10*3/uL — AB (ref 0.9–3.3)
LYMPH%: 29.5 % (ref 14.0–49.0)
MCH: 37.8 pg — ABNORMAL HIGH (ref 27.2–33.4)
MCHC: 34 g/dL (ref 32.0–36.0)
MCV: 111.4 fL — AB (ref 79.3–98.0)
MONO#: 0.3 10*3/uL (ref 0.1–0.9)
MONO%: 13.2 % (ref 0.0–14.0)
NEUT%: 53.7 % (ref 39.0–75.0)
NEUTROS ABS: 1.3 10*3/uL — AB (ref 1.5–6.5)
PLATELETS: 74 10*3/uL — AB (ref 140–400)
RBC: 2.77 10*6/uL — ABNORMAL LOW (ref 4.20–5.82)
RDW: 16.3 % — AB (ref 11.0–14.6)
WBC: 2.5 10*3/uL — AB (ref 4.0–10.3)

## 2016-03-21 ENCOUNTER — Ambulatory Visit: Payer: Medicare Other | Admitting: Physical Therapy

## 2016-03-21 ENCOUNTER — Encounter: Payer: Self-pay | Admitting: Physical Therapy

## 2016-03-21 DIAGNOSIS — M6281 Muscle weakness (generalized): Secondary | ICD-10-CM

## 2016-03-21 DIAGNOSIS — R262 Difficulty in walking, not elsewhere classified: Secondary | ICD-10-CM

## 2016-03-21 NOTE — Therapy (Signed)
[place], [place], [address] Phone: [phone]   Fax:  [phone]  Physical Therapy Treatment  Patient Details  Name: James [name] MRN: [id number] Date of Birth: 02/02/1946 Referring Provider: Barry Dienes  Encounter Date: 03/21/2016      PT End of Session - 03/21/16 0925    Visit Number 4   Date for PT Re-Evaluation 05/10/16   PT Start Time 0840   PT Stop Time 0925   PT Time Calculation (min) 45 min   Activity Tolerance Patient tolerated treatment well   Behavior During Therapy [facility] for tasks assessed/performed      Past Medical History  Diagnosis Date  . Abnormal EKG     NS ST-T EKG Changes, (-) Nuclear Stress Test 03/2006  . Hyperlipemia   . Duodenal ulcer 1972    transfusion required, 1 unit packed cells  . Hx of basal cell carcinoma   . S/P radiation therapy 11/29/2014 through 01/23/2015     Prostate 7800 cGy in 40 sessions, seminal vesicles 5600 cGy in 40 sessions   . B12 deficiency 06/05/2015    B12 Low NL at 271 methtylmalonic acid high  . Transfusion history     '72 -s/p surgery for duodenal ulcer  . Anemia     iron deficency  . Prostate cancer ([place]) 09/06/14    last radiation 4'16   . Skin cancer, basal cell   . Cardiac murmur     nothing to be concerned with   . Asthma   . Esophageal reflux     no currlently  . Gastric cancer ([place]) 07/18/15    invasive adenocarcinoma  . Allergy   . Gastrostomy tube in place [facility])     intermittent feeding  . Dysphagia     history throat/stomach  cancer "can swallow some, uses more pureed type foods"  . Melanoma [facility])     Past Surgical History  Procedure Laterality Date  . Trigger finger release Left 1998  . Rotator cuff repair Right     R shoulder, [place]  . Knee arthroscopy Right     Right Knee, [place] [facility]  . Colonoscopy with polypectomy  02/2012    Dr  Sharlett Iles  . Prostate biopsy  09/06/14  . Cataract extraction, bilateral Bilateral   . Vasectomy    . Eus N/A 06/14/2015    Procedure: ESOPHAGEAL ENDOSCOPIC ULTRASOUND (EUS) RADIAL;  Surgeon: Milus Banister, MD;  [facility];  Service: Endoscopy;  Laterality: N/A;  . Laparoscopy N/A 07/18/2015    Procedure: LAPAROSCOPY DIAGNOSTIC;  Surgeon: Stark Klein, MD;  [facility];  Service: General;  Laterality: N/A;  . Laparoscopic gastrectomy N/A 07/18/2015    Procedure: SUB TOTAL GASTRECTOMY;  Surgeon: Stark Klein, MD;  [facility];  Service: General;  Laterality: N/A;  . Laparoscopic gastrostomy N/A 07/18/2015    Procedure: FEEDING TUBE PLACEMENT;  Surgeon: Stark Klein, MD;  [facility];  Service: General;  Laterality: N/A;  . Esophagogastroduodenoscopy (egd) with propofol N/A 08/17/2015    Procedure: ESOPHAGOGASTRODUODENOSCOPY (EGD) WITH PROPOFOL;  Surgeon: Gatha Mayer, MD;  Location: [place];  Service: Endoscopy;  Laterality: N/A;  . Esophagogastroduodenoscopy (egd) with propofol N/A 09/12/2015    Procedure: ESOPHAGOGASTRODUODENOSCOPY (EGD) WITH PROPOFOL;  Surgeon: Gatha Mayer, MD;  [facility];  Service: Endoscopy;  Laterality: N/A;  . Balloon dilation N/A 09/12/2015    Procedure: BALLOON DILATION;  Surgeon: Gatha Mayer, MD;  [facility];  Service: Endoscopy;  Laterality: N/A;  . Esophagogastroduodenoscopy (egd) with propofol N/A 09/21/2015    Procedure: ESOPHAGOGASTRODUODENOSCOPY (EGD) WITH PROPOFOL;  Surgeon: Gatha Mayer, MD;  [facility];  Service: Endoscopy;  Laterality: N/A;  . Balloon dilation N/A 09/21/2015    Procedure: BALLOON DILATION;  Surgeon: Gatha Mayer, MD;  [facility];  Service: Endoscopy;  Laterality: N/A;  . Esophagogastroduodenoscopy (egd) with propofol N/A 10/04/2015    Procedure: ESOPHAGOGASTRODUODENOSCOPY (EGD) WITH PROPOFOL;  Surgeon: Milus Banister, MD;  [facility];   Service: Endoscopy;  Laterality: N/A;  possible stricture  . Balloon dilation N/A 10/04/2015    Procedure: BALLOON DILATION;  Surgeon: Milus Banister, MD;  [facility];  Service: Endoscopy;  Laterality: N/A;  . Esophagogastroduodenoscopy (egd) with propofol N/A 12/10/2015    Procedure: ESOPHAGOGASTRODUODENOSCOPY (EGD) WITH PROPOFOL;  Surgeon: Gatha Mayer, MD;  [facility];  Service: Endoscopy;  Laterality: N/A;  . Balloon dilation N/A 12/10/2015    Procedure: BALLOON DILATION;  Surgeon: Gatha Mayer, MD;  [facility];  Service: Endoscopy;  Laterality: N/A;  . Melanoma excision  02-01-16    abdominal wall  . Esophagogastroduodenoscopy (egd) with propofol N/A 02/06/2016    Procedure: ESOPHAGOGASTRODUODENOSCOPY (EGD) WITH PROPOFOL;  Surgeon: Gatha Mayer, MD;  [facility];  Service: Endoscopy;  Laterality: N/A;  . [place] dilation N/A 02/06/2016    Procedure: SAVORY DILATION;  Surgeon: Gatha Mayer, MD;  [facility];  Service: Endoscopy;  Laterality: N/A;    There were no vitals filed for this visit.      Subjective Assessment - 03/21/16 0849    Subjective I get fatigued with activities but doing better.   Currently in Pain? No/denies                         [facility] Adult PT Treatment/Exercise - 03/21/16 0001    High Level Balance   High Level Balance Comments on bosu balance   Knee/Hip Exercises: Aerobic   Elliptical R=5 I = 10 x 4 minutes   Nustep Level 6 x 6 minutes   Other Aerobic UBE constant work 30 wats x 4 minutes   Knee/Hip Exercises: Machines for Strengthening   Cybex Knee Extension 10# 2x15   Cybex Knee Flexion 35# 2x15   Cybex Leg Press 40# 2x10, 20# single legs x10 each   Hip Cybex 5# hips extension and abduction 2x10 each   Other Machine seated rows 25# 2x10, lats 25# 2x10, 35# straight arm pull downs                  PT Short Term Goals - 03/18/16 0942    PT SHORT TERM GOAL #1   Title  independent with initial HEP   Status Achieved           PT Long Term Goals - 03/21/16 0931    PT LONG TERM GOAL #1   Title return to playing tennis 2x/week   Status On-going   PT LONG TERM GOAL #2   Title ncrease UE and LE strength to 4/5   Status On-going   PT LONG TERM GOAL #3   Title tolerate 60 minutes of exercise   Status On-going   PT LONG TERM GOAL #4   Title be able to do single leg stance > 15 seconds   Status On-going  Plan - 03/21/16 0926    Clinical Impression Statement PAtient seemed to be a little more fatigued with activity today.  He did well with the balance today.   PT Next Visit Plan continue to add strength, function and balance   Consulted and Agree with Plan of Care Patient      Patient will benefit from skilled therapeutic intervention in order to improve the following deficits and impairments:  Abnormal gait, Cardiopulmonary status limiting activity, Decreased activity tolerance, Decreased mobility, Decreased endurance, Decreased range of motion, Difficulty walking  Visit Diagnosis: Muscle weakness (generalized)  Difficulty in walking, not elsewhere classified     Problem List Patient Active Problem List   Diagnosis Date Noted  . Malignant melanoma ([place]) 01/08/2016  . Acute esophagitis   . Dysphagia   . Anastomotic stricture of stomach   . Esophageal stricture   . 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 Gastric cancer ([place]) 06/08/2015  . B12 deficiency 06/05/2015  . Prostate cancer ([place]) 10/24/2014  . Unspecified vitamin D deficiency 06/03/2014  . History of duodenal ulcer 12/16/2013  . History of transfusion 12/16/2013  . Basal cell cancer 09/17/2012  . HEARING DEFICIT 12/21/2009  . NONSPECIFIC ABNORMAL ELECTROCARDIOGRAM 12/21/2009  . FASTING HYPERGLYCEMIA 12/04/2008  . History of colonic polyps 12/04/2008  . HYPERLIPIDEMIA 08/23/2007     Sumner Boast., PT 03/21/2016, 9:32 AM  [place] [id number] [facility] [place], [place], [address] Phone: [phone]   Fax:  [phone]  Name: ONIX BARBERI MRN: [id number] Date of Birth: 16-Dec-1945

## 2016-03-25 ENCOUNTER — Encounter: Payer: Self-pay | Admitting: Physical Therapy

## 2016-03-25 ENCOUNTER — Telehealth: Payer: Self-pay

## 2016-03-25 ENCOUNTER — Ambulatory Visit: Payer: Medicare Other | Admitting: Physical Therapy

## 2016-03-25 DIAGNOSIS — C61 Malignant neoplasm of prostate: Secondary | ICD-10-CM

## 2016-03-25 DIAGNOSIS — R262 Difficulty in walking, not elsewhere classified: Secondary | ICD-10-CM | POA: Diagnosis not present

## 2016-03-25 DIAGNOSIS — M6281 Muscle weakness (generalized): Secondary | ICD-10-CM

## 2016-03-25 NOTE — Telephone Encounter (Signed)
-----   Message from Owens Shark, NP sent at 03/25/2016  2:48 PM EDT ----- Check cbc 1 month to be sure platelets come up

## 2016-03-25 NOTE — Telephone Encounter (Signed)
Called and informed patients wife of plt count and to recheck in one month to make sure it is still coming up. Patients wife verbalized understanding and knows to call with any bleeding or any other abnormalities.

## 2016-03-25 NOTE — Therapy (Signed)
[place], [place], [address] Phone: [phone]   Fax:  [phone]  Physical Therapy Treatment  Patient Details  Name: James [name] MRN: [id number] Date of Birth: 17-Jun-1946 Referring Provider: Barry Dienes  Encounter Date: 03/25/2016      PT End of Session - 03/25/16 0923    Visit Number 5   Date for PT Re-Evaluation 05/10/16   PT Start Time 0840   PT Stop Time 0930   PT Time Calculation (min) 50 min   Activity Tolerance Patient tolerated treatment well   Behavior During Therapy [facility] for tasks assessed/performed      Past Medical History  Diagnosis Date  . Abnormal EKG     NS ST-T EKG Changes, (-) Nuclear Stress Test 03/2006  . Hyperlipemia   . Duodenal ulcer 1972    transfusion required, 1 unit packed cells  . Hx of basal cell carcinoma   . S/P radiation therapy 11/29/2014 through 01/23/2015     Prostate 7800 cGy in 40 sessions, seminal vesicles 5600 cGy in 40 sessions   . B12 deficiency 06/05/2015    B12 Low NL at 271 methtylmalonic acid high  . Transfusion history     '72 -s/p surgery for duodenal ulcer  . Anemia     iron deficency  . Prostate cancer ([place]) 09/06/14    last radiation 4'16   . Skin cancer, basal cell   . Cardiac murmur     nothing to be concerned with   . Asthma   . Esophageal reflux     no currlently  . Gastric cancer ([place]) 07/18/15    invasive adenocarcinoma  . Allergy   . Gastrostomy tube in place [facility])     intermittent feeding  . Dysphagia     history throat/stomach  cancer "can swallow some, uses more pureed type foods"  . Melanoma [facility])     Past Surgical History  Procedure Laterality Date  . Trigger finger release Left 1998  . Rotator cuff repair Right     R shoulder, [place]  . Knee arthroscopy Right     Right Knee, [place] [facility]  . Colonoscopy with polypectomy  02/2012    Dr  Sharlett Iles  . Prostate biopsy  09/06/14  . Cataract extraction, bilateral Bilateral   . Vasectomy    . Eus N/A 06/14/2015    Procedure: ESOPHAGEAL ENDOSCOPIC ULTRASOUND (EUS) RADIAL;  Surgeon: Milus Banister, MD;  [facility];  Service: Endoscopy;  Laterality: N/A;  . Laparoscopy N/A 07/18/2015    Procedure: LAPAROSCOPY DIAGNOSTIC;  Surgeon: Stark Klein, MD;  [facility];  Service: General;  Laterality: N/A;  . Laparoscopic gastrectomy N/A 07/18/2015    Procedure: SUB TOTAL GASTRECTOMY;  Surgeon: Stark Klein, MD;  [facility];  Service: General;  Laterality: N/A;  . Laparoscopic gastrostomy N/A 07/18/2015    Procedure: FEEDING TUBE PLACEMENT;  Surgeon: Stark Klein, MD;  [facility];  Service: General;  Laterality: N/A;  . Esophagogastroduodenoscopy (egd) with propofol N/A 08/17/2015    Procedure: ESOPHAGOGASTRODUODENOSCOPY (EGD) WITH PROPOFOL;  Surgeon: Gatha Mayer, MD;  Location: [place];  Service: Endoscopy;  Laterality: N/A;  . Esophagogastroduodenoscopy (egd) with propofol N/A 09/12/2015    Procedure: ESOPHAGOGASTRODUODENOSCOPY (EGD) WITH PROPOFOL;  Surgeon: Gatha Mayer, MD;  [facility];  Service: Endoscopy;  Laterality: N/A;  . Balloon dilation N/A 09/12/2015    Procedure: BALLOON DILATION;  Surgeon: Gatha Mayer, MD;  [facility];  Service: Endoscopy;  Laterality: N/A;  . Esophagogastroduodenoscopy (egd) with propofol N/A 09/21/2015    Procedure: ESOPHAGOGASTRODUODENOSCOPY (EGD) WITH PROPOFOL;  Surgeon: Gatha Mayer, MD;  [facility];  Service: Endoscopy;  Laterality: N/A;  . Balloon dilation N/A 09/21/2015    Procedure: BALLOON DILATION;  Surgeon: Gatha Mayer, MD;  [facility];  Service: Endoscopy;  Laterality: N/A;  . Esophagogastroduodenoscopy (egd) with propofol N/A 10/04/2015    Procedure: ESOPHAGOGASTRODUODENOSCOPY (EGD) WITH PROPOFOL;  Surgeon: Milus Banister, MD;  [facility];   Service: Endoscopy;  Laterality: N/A;  possible stricture  . Balloon dilation N/A 10/04/2015    Procedure: BALLOON DILATION;  Surgeon: Milus Banister, MD;  [facility];  Service: Endoscopy;  Laterality: N/A;  . Esophagogastroduodenoscopy (egd) with propofol N/A 12/10/2015    Procedure: ESOPHAGOGASTRODUODENOSCOPY (EGD) WITH PROPOFOL;  Surgeon: Gatha Mayer, MD;  [facility];  Service: Endoscopy;  Laterality: N/A;  . Balloon dilation N/A 12/10/2015    Procedure: BALLOON DILATION;  Surgeon: Gatha Mayer, MD;  [facility];  Service: Endoscopy;  Laterality: N/A;  . Melanoma excision  02-01-16    abdominal wall  . Esophagogastroduodenoscopy (egd) with propofol N/A 02/06/2016    Procedure: ESOPHAGOGASTRODUODENOSCOPY (EGD) WITH PROPOFOL;  Surgeon: Gatha Mayer, MD;  [facility];  Service: Endoscopy;  Laterality: N/A;  . Savory dilation N/A 02/06/2016    Procedure: SAVORY DILATION;  Surgeon: Gatha Mayer, MD;  [facility];  Service: Endoscopy;  Laterality: N/A;    There were no vitals filed for this visit.      Subjective Assessment - 03/25/16 0844    Subjective I was able to play 18 holes of golf with minimal fatigue   Currently in Pain? No/denies                         [facility] Adult PT Treatment/Exercise - 03/25/16 0001    High Level Balance   High Level Balance Comments on bosu balance, resisted gait all directions   Knee/Hip Exercises: Aerobic   Elliptical R=7 I = 10 x 4 minutes   Other Aerobic UBE constant work 30 wats x 4 minutes   Knee/Hip Exercises: Machines for Strengthening   Cybex Knee Extension 10# 2x15   Cybex Knee Flexion 35# 2x15   Cybex Leg Press 40# 2x10, 20# single legs x10 each   Hip Cybex 5# hips extension and abduction 2x10 each   Other Machine seated rows 25# 2x10, lats 25# 2x10, 35# straight arm pull downs, 10# chest press   Knee/Hip Exercises: Standing   Other Standing Knee Exercises ball wall  toss and ball slams, ball obliques slams 6.6# ball                  PT Short Term Goals - 03/18/16 0942    PT SHORT TERM GOAL #1   Title independent with initial HEP   Status Achieved           PT Long Term Goals - 03/25/16 0925    PT LONG TERM GOAL #1   Title return to playing tennis 2x/week   Status On-going   PT LONG TERM GOAL #3   Title tolerate 60 minutes of exercise   Status On-going               Plan - 03/25/16 0924    Clinical Impression Statement The large mm and multiple joint exercises  really do fatigue him.  Balance was better today   PT Next Visit Plan continue to add strength, function and balance   Consulted and Agree with Plan of Care Patient      Patient will benefit from skilled therapeutic intervention in order to improve the following deficits and impairments:  Abnormal gait, Cardiopulmonary status limiting activity, Decreased activity tolerance, Decreased mobility, Decreased endurance, Decreased range of motion, Difficulty walking  Visit Diagnosis: Muscle weakness (generalized)  Difficulty in walking, not elsewhere classified     Problem List Patient Active Problem List   Diagnosis Date Noted  . Malignant melanoma ([place]) 01/08/2016  . Acute esophagitis   . Dysphagia   . Anastomotic stricture of stomach   . Esophageal stricture   . 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 Gastric cancer ([place]) 06/08/2015  . B12 deficiency 06/05/2015  . Prostate cancer ([place]) 10/24/2014  . Unspecified vitamin D deficiency 06/03/2014  . History of duodenal ulcer 12/16/2013  . History of transfusion 12/16/2013  . Basal cell cancer 09/17/2012  . HEARING DEFICIT 12/21/2009  . NONSPECIFIC ABNORMAL ELECTROCARDIOGRAM 12/21/2009  . FASTING HYPERGLYCEMIA 12/04/2008  . History of colonic polyps 12/04/2008  . HYPERLIPIDEMIA 08/23/2007    Sumner Boast., PT 03/25/2016, 9:27 AM  [place] [id number] [facility] [place], [place], [address] Phone: [phone]   Fax:  [phone]  Name: James [name] MRN: [id number] Date of Birth: 02-19-1946

## 2016-03-26 ENCOUNTER — Telehealth: Payer: Self-pay | Admitting: Oncology

## 2016-03-26 NOTE — Telephone Encounter (Signed)
lvm to inform pt of lab only appt 7/20 at 1045 am per 6/20 pof

## 2016-03-27 ENCOUNTER — Encounter: Payer: Self-pay | Admitting: Physical Therapy

## 2016-03-27 ENCOUNTER — Ambulatory Visit: Payer: Medicare Other | Admitting: Physical Therapy

## 2016-03-27 DIAGNOSIS — R262 Difficulty in walking, not elsewhere classified: Secondary | ICD-10-CM

## 2016-03-27 DIAGNOSIS — M6281 Muscle weakness (generalized): Secondary | ICD-10-CM

## 2016-03-27 NOTE — Therapy (Signed)
[facility] [place], [place], [address] Phone: [phone]   Fax:  [phone]  Physical Therapy Treatment  Patient Details  Name: James [name] MRN: [id number] Date of Birth: 1946-08-19 Referring Provider: Barry Dienes  Encounter Date: 03/27/2016      PT End of Session - 03/27/16 0924    Visit Number 6   Date for PT Re-Evaluation 05/10/16   PT Start Time 0839   PT Stop Time 0925   PT Time Calculation (min) 46 min   Activity Tolerance Patient tolerated treatment well   Behavior During Therapy [facility] for tasks assessed/performed      Past Medical History  Diagnosis Date  . Abnormal EKG     NS ST-T EKG Changes, (-) Nuclear Stress Test 03/2006  . Hyperlipemia   . Duodenal ulcer 1972    transfusion required, 1 unit packed cells  . Hx of basal cell carcinoma   . S/P radiation therapy 11/29/2014 through 01/23/2015     Prostate 7800 cGy in 40 sessions, seminal vesicles 5600 cGy in 40 sessions   . B12 deficiency 06/05/2015    B12 Low NL at 271 methtylmalonic acid high  . Transfusion history     '72 -s/p surgery for duodenal ulcer  . Anemia     iron deficency  . Prostate cancer ([place]) 09/06/14    last radiation 4'16   . Skin cancer, basal cell   . Cardiac murmur     nothing to be concerned with   . Asthma   . Esophageal reflux     no currlently  . Gastric cancer ([place]) 07/18/15    invasive adenocarcinoma  . Allergy   . Gastrostomy tube in place [facility])     intermittent feeding  . Dysphagia     history throat/stomach  cancer "can swallow some, uses more pureed type foods"  . Melanoma [facility])     Past Surgical History  Procedure Laterality Date  . Trigger finger release Left 1998  . Rotator cuff repair Right     R shoulder, [place]  . Knee arthroscopy Right     Right Knee, [place] [facility]  . Colonoscopy with polypectomy  02/2012    Dr  Sharlett Iles  . Prostate biopsy  09/06/14  . Cataract extraction, bilateral Bilateral   . Vasectomy    . Eus N/A 06/14/2015    Procedure: ESOPHAGEAL ENDOSCOPIC ULTRASOUND (EUS) RADIAL;  Surgeon: Milus Banister, MD;  Location: [facility];  Service: Endoscopy;  Laterality: N/A;  . Laparoscopy N/A 07/18/2015    Procedure: LAPAROSCOPY DIAGNOSTIC;  Surgeon: Stark Klein, MD;  Location: [facility];  Service: General;  Laterality: N/A;  . Laparoscopic gastrectomy N/A 07/18/2015    Procedure: SUB TOTAL GASTRECTOMY;  Surgeon: Stark Klein, MD;  Location: [facility];  Service: General;  Laterality: N/A;  . Laparoscopic gastrostomy N/A 07/18/2015    Procedure: FEEDING TUBE PLACEMENT;  Surgeon: Stark Klein, MD;  Location: [facility];  Service: General;  Laterality: N/A;  . Esophagogastroduodenoscopy (egd) with propofol N/A 08/17/2015    Procedure: ESOPHAGOGASTRODUODENOSCOPY (EGD) WITH PROPOFOL;  Surgeon: Gatha Mayer, MD;  Location: [facility];  Service: Endoscopy;  Laterality: N/A;  . Esophagogastroduodenoscopy (egd) with propofol N/A 09/12/2015    Procedure: ESOPHAGOGASTRODUODENOSCOPY (EGD) WITH PROPOFOL;  Surgeon: Gatha Mayer, MD;  [facility];  Service: Endoscopy;  Laterality: N/A;  . Balloon dilation N/A 09/12/2015    Procedure: BALLOON DILATION;  Surgeon: Gatha Mayer, MD;  [facility];  Service: Endoscopy;  Laterality: N/A;  . Esophagogastroduodenoscopy (egd) with propofol N/A 09/21/2015    Procedure: ESOPHAGOGASTRODUODENOSCOPY (EGD) WITH PROPOFOL;  Surgeon: Gatha Mayer, MD;  [facility];  Service: Endoscopy;  Laterality: N/A;  . Balloon dilation N/A 09/21/2015    Procedure: BALLOON DILATION;  Surgeon: Gatha Mayer, MD;  [facility];  Service: Endoscopy;  Laterality: N/A;  . Esophagogastroduodenoscopy (egd) with propofol N/A 10/04/2015    Procedure: ESOPHAGOGASTRODUODENOSCOPY (EGD) WITH PROPOFOL;  Surgeon: Milus Banister, MD;  [facility];   Service: Endoscopy;  Laterality: N/A;  possible stricture  . Balloon dilation N/A 10/04/2015    Procedure: BALLOON DILATION;  Surgeon: Milus Banister, MD;  [facility];  Service: Endoscopy;  Laterality: N/A;  . Esophagogastroduodenoscopy (egd) with propofol N/A 12/10/2015    Procedure: ESOPHAGOGASTRODUODENOSCOPY (EGD) WITH PROPOFOL;  Surgeon: Gatha Mayer, MD;  [facility];  Service: Endoscopy;  Laterality: N/A;  . Balloon dilation N/A 12/10/2015    Procedure: BALLOON DILATION;  Surgeon: Gatha Mayer, MD;  [facility];  Service: Endoscopy;  Laterality: N/A;  . Melanoma excision  02-01-16    abdominal wall  . Esophagogastroduodenoscopy (egd) with propofol N/A 02/06/2016    Procedure: ESOPHAGOGASTRODUODENOSCOPY (EGD) WITH PROPOFOL;  Surgeon: Gatha Mayer, MD;  [facility];  Service: Endoscopy;  Laterality: N/A;  . [place] dilation N/A 02/06/2016    Procedure: SAVORY DILATION;  Surgeon: Gatha Mayer, MD;  [facility];  Service: Endoscopy;  Laterality: N/A;    There were no vitals filed for this visit.      Subjective Assessment - 03/27/16 0842    Subjective Was able to play tennis yesterday, reports fatigue and difficulty running.   Currently in Pain? No/denies                         [facility] Adult PT Treatment/Exercise - 03/27/16 0001    Ambulation/Gait   Gait Comments jog up hill x 3 at 60 feet, side shuffles with ball tosses   High Level Balance   High Level Balance Comments on bosu balance, resisted gait all directions   Knee/Hip Exercises: Aerobic   Elliptical R=7 I = 10 x 4 minutes   Other Aerobic UBE constant work 30 wats x 4 minutes   Knee/Hip Exercises: Machines for Strengthening   Hip Cybex 5# hips extension and abduction 2x10 each   Other Machine seated rows 25# 2x10, lats 25# 2x10, 35# straight arm pull downs, 10# chest press   Knee/Hip Exercises: Standing   Other Standing Knee Exercises ball wall toss  and ball slams, ball obliques slams 6.6# ball   Other Standing Knee Exercises circuit for LE 9" step ups x 20 followed by 10 butt touches   Knee/Hip Exercises: Seated   Sit to Sand 20 reps  with weighted ball                  PT Short Term Goals - 03/18/16 0942    PT SHORT TERM GOAL #1   Title independent with initial HEP   Status Achieved           PT Long Term Goals - 03/27/16 0925    PT LONG TERM GOAL #4   Title be able to do single leg stance > 15 seconds   Status Achieved               Plan - 03/27/16  0924    Clinical Impression Statement Doing very well, has been able to play tennis and golf, does report fatigue, reports he has decreased platelets.  Large mm and multiple mm exercises does fatigue him quickly   PT Next Visit Plan continue to add strength, function and balance   Consulted and Agree with Plan of Care Patient      Patient will benefit from skilled therapeutic intervention in order to improve the following deficits and impairments:  Abnormal gait, Cardiopulmonary status limiting activity, Decreased activity tolerance, Decreased mobility, Decreased endurance, Decreased range of motion, Difficulty walking  Visit Diagnosis: Muscle weakness (generalized)  Difficulty in walking, not elsewhere classified     Problem List Patient Active Problem List   Diagnosis Date Noted  . Malignant melanoma ([place]) 01/08/2016  . Acute esophagitis   . Dysphagia   . Anastomotic stricture of stomach   . Esophageal stricture   . 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 Gastric cancer ([place]) 06/08/2015  . B12 deficiency 06/05/2015  . Prostate cancer ([place]) 10/24/2014  . Unspecified vitamin D deficiency 06/03/2014  . History of duodenal ulcer 12/16/2013  . History of transfusion 12/16/2013  . Basal cell cancer 09/17/2012  . HEARING DEFICIT 12/21/2009  . NONSPECIFIC ABNORMAL ELECTROCARDIOGRAM  12/21/2009  . FASTING HYPERGLYCEMIA 12/04/2008  . History of colonic polyps 12/04/2008  . HYPERLIPIDEMIA 08/23/2007    Sumner Boast., PT 03/27/2016, 9:26 AM  [facility] [id number] [facility] [facility] [place], [place], [address] Phone: [phone]   Fax:  [phone]  Name: James [name] MRN: [id number] Date of Birth: [birth year]

## 2016-04-03 ENCOUNTER — Telehealth: Payer: Self-pay | Admitting: Internal Medicine

## 2016-04-03 DIAGNOSIS — R1310 Dysphagia, unspecified: Secondary | ICD-10-CM

## 2016-04-03 NOTE — Telephone Encounter (Signed)
Pt states he is having a return of dysphagia and per Dr Carlean Purl last procedure  "Repeat upper endoscopy if dysphagia returns." Dr Carlean Purl is this ok to direct book?

## 2016-04-07 DIAGNOSIS — R634 Abnormal weight loss: Secondary | ICD-10-CM | POA: Diagnosis not present

## 2016-04-07 DIAGNOSIS — C161 Malignant neoplasm of fundus of stomach: Secondary | ICD-10-CM | POA: Diagnosis not present

## 2016-04-07 DIAGNOSIS — K3189 Other diseases of stomach and duodenum: Secondary | ICD-10-CM | POA: Diagnosis not present

## 2016-04-07 DIAGNOSIS — K929 Disease of digestive system, unspecified: Secondary | ICD-10-CM | POA: Diagnosis not present

## 2016-04-08 NOTE — Telephone Encounter (Signed)
Please arrange for EGD/dilation in [place] - hopefully this week Dx: dysphagia

## 2016-04-09 NOTE — Telephone Encounter (Signed)
Patient is scheduled for 04/11/16 and he will come by this afternoon and sign paperwork

## 2016-04-09 NOTE — Addendum Note (Signed)
Addended by: Marlon Pel on: 04/09/2016 03:44 PM   Modules accepted: Orders

## 2016-04-09 NOTE — Telephone Encounter (Signed)
Left message for patient to call back  

## 2016-04-11 ENCOUNTER — Encounter: Payer: Self-pay | Admitting: Internal Medicine

## 2016-04-11 ENCOUNTER — Ambulatory Visit (AMBULATORY_SURGERY_CENTER): Payer: Medicare Other | Admitting: Internal Medicine

## 2016-04-11 VITALS — BP 121/67 | HR 58 | Temp 97.1°F | Resp 19 | Ht 66.5 in | Wt 159.0 lb

## 2016-04-11 DIAGNOSIS — K219 Gastro-esophageal reflux disease without esophagitis: Secondary | ICD-10-CM | POA: Diagnosis not present

## 2016-04-11 DIAGNOSIS — R1310 Dysphagia, unspecified: Secondary | ICD-10-CM

## 2016-04-11 DIAGNOSIS — Z886 Allergy status to analgesic agent status: Secondary | ICD-10-CM | POA: Diagnosis not present

## 2016-04-11 DIAGNOSIS — K222 Esophageal obstruction: Secondary | ICD-10-CM

## 2016-04-11 MED ORDER — SODIUM CHLORIDE 0.9 % IV SOLN: 500.0000 mL | INTRAVENOUS | Status: DC

## 2016-04-11 NOTE — Patient Instructions (Addendum)
I dilated to 20 mm. It was not as narrow starting out as in past. Let me know when you need it again.  Have a great time at [facility]!  I appreciate the opportunity to care for you. Gatha Mayer, MD, [facility]   Discharge instructions given. Handout on a dilatation diet. Resume previous medications. YOU HAD AN ENDOSCOPIC PROCEDURE TODAY AT [facility]:   Refer to the procedure report that was given to you for any specific questions about what was found during the examination.  If the procedure report does not answer your questions, please call your gastroenterologist to clarify.  If you requested that your care partner not be given the details of your procedure findings, then the procedure report has been included in a sealed envelope for you to review at your convenience later.  YOU SHOULD EXPECT: Some feelings of bloating in the abdomen. Passage of more gas than usual.  Walking can help get rid of the air that was put into your GI tract during the procedure and reduce the bloating. If you had a lower endoscopy (such as a colonoscopy or flexible sigmoidoscopy) you may notice spotting of blood in your stool or on the toilet paper. If you underwent a bowel prep for your procedure, you may not have a normal bowel movement for a few days.  Please Note:  You might notice some irritation and congestion in your nose or some drainage.  This is from the oxygen used during your procedure.  There is no need for concern and it should clear up in a day or so.  SYMPTOMS TO REPORT IMMEDIATELY:    Following upper endoscopy (EGD)  Vomiting of blood or coffee ground material  New chest pain or pain under the shoulder blades  Painful or persistently difficult swallowing  New shortness of breath  Fever of 100F or higher  Black, tarry-looking stools  For urgent or emergent issues, a gastroenterologist can be reached at any hour by calling [phone].   DIET: Your first meal  following the procedure should be a small meal and then it is ok to progress to your normal diet. Heavy or fried foods are harder to digest and may make you feel nauseous or bloated.  Likewise, meals heavy in dairy and vegetables can increase bloating.  Drink plenty of fluids but you should avoid alcoholic beverages for 24 hours.  ACTIVITY:  You should plan to take it easy for the rest of today and you should NOT DRIVE or use heavy machinery until tomorrow (because of the sedation medicines used during the test).    FOLLOW UP: Our staff will call the number listed on your records the next business day following your procedure to check on you and address any questions or concerns that you may have regarding the information given to you following your procedure. If we do not reach you, we will leave a message.  However, if you are feeling well and you are not experiencing any problems, there is no need to return our call.  We will assume that you have returned to your regular daily activities without incident.  If any biopsies were taken you will be contacted by phone or by letter within the next 1-3 weeks.  Please call us at [phone] if you have not heard about the biopsies in 3 weeks.    SIGNATURES/CONFIDENTIALITY: You and/or your care partner have signed paperwork which will be entered into your electronic medical record.  These signatures attest to the fact that that the information above on your After Visit Summary has been reviewed and is understood.  Full responsibility of the confidentiality of this discharge information lies with you and/or your care-partner.

## 2016-04-11 NOTE — Op Note (Signed)
James [name] Patient Name: James [name] Procedure Date: 04/11/2016 7:45 AM MRN: [id number] Endoscopist: Gatha Mayer , MD Age: 70 Referring MD:  Date of Birth: [birth year] Gender: Male Account #: [id number] Procedure:                Upper GI endoscopy Indications:              Therapeutic procedure, Esophageal dysphagia,                            Stricture of the esophagus, For therapy of                            esophageal stricture Medicines:                Propofol per Anesthesia, Monitored Anesthesia Care Procedure:                Pre-Anesthesia Assessment:                           - Prior to the procedure, a History and Physical                            was performed, and patient medications and                            allergies were reviewed. The patient's tolerance of                            previous anesthesia was also reviewed. The risks                            and benefits of the procedure and the sedation                            options and risks were discussed with the patient.                            All questions were answered, and informed consent                            was obtained. Prior Anticoagulants: The patient has                            taken no previous anticoagulant or antiplatelet                            agents. ASA Grade Assessment: II - A patient with                            mild systemic disease. After reviewing the risks                            and benefits, the patient was deemed in  satisfactory condition to undergo the procedure.                           After obtaining informed consent, the endoscope was                            passed under direct vision. Throughout the                            procedure, the patient's blood pressure, pulse, and                            oxygen saturations were monitored continuously. The                            Model GIF-HQ190  [id number]) scope was introduced                            through the mouth, and advanced to the second part                            of duodenum. The upper GI endoscopy was                            accomplished without difficulty. The patient                            tolerated the procedure well. Scope In: Scope Out: Findings:                 One severe benign-appearing, intrinsic stenosis was                            found 40 cm from the incisors. This measured 8 mm                            (inner diameter) x less than one cm (in length) and                            was traversed after dilation. A TTS dilator was                            passed through the scope. Dilation with a 16-17-18                            mm balloon and an 18-19-20 mm balloon dilator was                            performed to 20 mm. The dilation site was examined                            and showed complete resolution of luminal  narrowing. Estimated blood loss was minimal.                           Evidence of a proximal gastric resection were found                            in the cardia, in the gastric fundus and in the                            gastric body. This was characterized by healthy                            appearing mucosa.                           The examined duodenum was normal.                           Normal post-op gastric retroflexion. Complications:            No immediate complications. Estimated Blood Loss:     Estimated blood loss was minimal. Impression:               - Benign-appearing esophageal stenosis. Dilated.                           - A proximal gastric resection were found,                            characterized by healthy appearing mucosa.                           - Normal examined duodenum.                           - No specimens collected. Recommendation:           - Patient has a contact number available for                             emergencies. The signs and symptoms of potential                            delayed complications were discussed with the                            patient. Return to normal activities tomorrow.                            Written discharge instructions were provided to the                            patient.                           - Clear liquids x 1 hour then soft foods rest of  day. Start prior diet tomorrow.                           - Continue present medications.                           - Repeat upper endoscopy PRN as needed. Gatha Mayer, MD 04/11/2016 8:20:54 AM This report has been signed electronically.

## 2016-04-11 NOTE — Progress Notes (Signed)
Report to PACU, RN, vss, BBS= Clear.  

## 2016-04-11 NOTE — Progress Notes (Signed)
Called to room to assist during endoscopic procedure.  Patient ID and intended procedure confirmed with present staff. Received instructions for my participation in the procedure from the performing physician.  

## 2016-04-14 ENCOUNTER — Telehealth: Payer: Self-pay | Admitting: *Deleted

## 2016-04-14 NOTE — Telephone Encounter (Signed)
No answer, message left for the patient. 

## 2016-04-15 ENCOUNTER — Ambulatory Visit: Payer: Medicare Other | Admitting: Physical Therapy

## 2016-04-24 ENCOUNTER — Other Ambulatory Visit (HOSPITAL_BASED_OUTPATIENT_CLINIC_OR_DEPARTMENT_OTHER): Payer: Medicare Other

## 2016-04-24 DIAGNOSIS — C161 Malignant neoplasm of fundus of stomach: Secondary | ICD-10-CM

## 2016-04-24 DIAGNOSIS — C165 Malignant neoplasm of lesser curvature of stomach, unspecified: Secondary | ICD-10-CM

## 2016-04-24 LAB — CBC WITH DIFFERENTIAL/PLATELET
BASO%: 0.4 % (ref 0.0–2.0)
BASOS ABS: 0 10*3/uL (ref 0.0–0.1)
EOS ABS: 0.2 10*3/uL (ref 0.0–0.5)
EOS%: 6 % (ref 0.0–7.0)
HCT: 31.6 % — ABNORMAL LOW (ref 38.4–49.9)
HGB: 10.7 g/dL — ABNORMAL LOW (ref 13.0–17.1)
LYMPH%: 21.9 % (ref 14.0–49.0)
MCH: 34.8 pg — ABNORMAL HIGH (ref 27.2–33.4)
MCHC: 33.9 g/dL (ref 32.0–36.0)
MCV: 102.5 fL — AB (ref 79.3–98.0)
MONO#: 0.5 10*3/uL (ref 0.1–0.9)
MONO%: 14.3 % — AB (ref 0.0–14.0)
NEUT%: 57.4 % (ref 39.0–75.0)
NEUTROS ABS: 1.9 10*3/uL (ref 1.5–6.5)
Platelets: 96 10*3/uL — ABNORMAL LOW (ref 140–400)
RBC: 3.08 10*6/uL — AB (ref 4.20–5.82)
RDW: 13.8 % (ref 11.0–14.6)
WBC: 3.4 10*3/uL — AB (ref 4.0–10.3)
lymph#: 0.7 10*3/uL — ABNORMAL LOW (ref 0.9–3.3)

## 2016-04-25 ENCOUNTER — Ambulatory Visit: Payer: Medicare Other | Attending: General Surgery | Admitting: Physical Therapy

## 2016-04-25 ENCOUNTER — Encounter: Payer: Self-pay | Admitting: Physical Therapy

## 2016-04-25 DIAGNOSIS — R262 Difficulty in walking, not elsewhere classified: Secondary | ICD-10-CM | POA: Diagnosis not present

## 2016-04-25 DIAGNOSIS — M6281 Muscle weakness (generalized): Secondary | ICD-10-CM | POA: Diagnosis not present

## 2016-04-25 NOTE — Therapy (Signed)
[place], [place], [address] Phone: [phone]   Fax:  [phone]  Physical Therapy Treatment  Patient Details  Name: James [name] MRN: [id number] Date of Birth: 1946/09/09 Referring Provider: Barry Dienes  Encounter Date: 04/25/2016      PT End of Session - 04/25/16 0840    Visit Number 7   Date for PT Re-Evaluation 05/10/16   PT Start Time 0800   PT Stop Time 0843   PT Time Calculation (min) 43 min   Activity Tolerance Patient tolerated treatment well   Behavior During Therapy [facility] for tasks assessed/performed      Past Medical History  Diagnosis Date  . Abnormal EKG     NS ST-T EKG Changes, (-) Nuclear Stress Test 03/2006  . Duodenal ulcer 1972    transfusion required, 1 unit packed cells  . Hx of basal cell carcinoma   . S/P radiation therapy 11/29/2014 through 01/23/2015     Prostate 7800 cGy in 40 sessions, seminal vesicles 5600 cGy in 40 sessions   . B12 deficiency 06/05/2015    B12 Low NL at 271 methtylmalonic acid high  . Transfusion history     '72 -s/p surgery for duodenal ulcer  . Anemia     iron deficency  . Prostate cancer ([place]) 09/06/14    last radiation 4'16   . Skin cancer, basal cell   . Cardiac murmur     nothing to be concerned with   . Esophageal reflux     no currlently  . Gastric cancer ([place]) 07/18/15    invasive adenocarcinoma  . Allergy   . Gastrostomy tube in place [facility])     intermittent feeding  . Dysphagia     history throat/stomach  cancer "can swallow some, uses more pureed type foods"  . Melanoma ([place])   . Hyperlipemia     no per pt 04-11-16    Past Surgical History  Procedure Laterality Date  . Trigger finger release Left 1998  . Rotator cuff repair Right     R shoulder, [place]  . Knee arthroscopy Right     Right Knee, [place] [facility]  . Colonoscopy with polypectomy  02/2012   Dr Sharlett Iles  . Prostate biopsy  09/06/14  . Cataract extraction, bilateral Bilateral   . Vasectomy    . Eus N/A 06/14/2015    Procedure: ESOPHAGEAL ENDOSCOPIC ULTRASOUND (EUS) RADIAL;  Surgeon: Milus Banister, MD;  [facility]: [facility];  Service: Endoscopy;  Laterality: N/A;  . Laparoscopy N/A 07/18/2015    Procedure: LAPAROSCOPY DIAGNOSTIC;  Surgeon: Stark Klein, MD;  Location: [facility];  Service: General;  Laterality: N/A;  . Laparoscopic gastrectomy N/A 07/18/2015    Procedure: SUB TOTAL GASTRECTOMY;  Surgeon: Stark Klein, MD;  [facility];  Service: General;  Laterality: N/A;  . Laparoscopic gastrostomy N/A 07/18/2015    Procedure: FEEDING TUBE PLACEMENT;  Surgeon: Stark Klein, MD;  [facility];  Service: General;  Laterality: N/A;  . Esophagogastroduodenoscopy (egd) with propofol N/A 08/17/2015    Procedure: ESOPHAGOGASTRODUODENOSCOPY (EGD) WITH PROPOFOL;  Surgeon: Gatha Mayer, MD;  Location: [facility];  Service: Endoscopy;  Laterality: N/A;  . Esophagogastroduodenoscopy (egd) with propofol N/A 09/12/2015    Procedure: ESOPHAGOGASTRODUODENOSCOPY (EGD) WITH PROPOFOL;  Surgeon: Gatha Mayer, MD;  [facility];  Service: Endoscopy;  Laterality: N/A;  . Balloon dilation N/A 09/12/2015    Procedure: BALLOON DILATION;  Surgeon: Gatha Mayer, MD;  [facility];  Service: Endoscopy;  Laterality: N/A;  . Esophagogastroduodenoscopy (egd) with propofol N/A 09/21/2015    Procedure: ESOPHAGOGASTRODUODENOSCOPY (EGD) WITH PROPOFOL;  Surgeon: Gatha Mayer, MD;  [facility];  Service: Endoscopy;  Laterality: N/A;  . Balloon dilation N/A 09/21/2015    Procedure: BALLOON DILATION;  Surgeon: Gatha Mayer, MD;  [facility];  Service: Endoscopy;  Laterality: N/A;  . Esophagogastroduodenoscopy (egd) with propofol N/A 10/04/2015    Procedure: ESOPHAGOGASTRODUODENOSCOPY (EGD) WITH PROPOFOL;  Surgeon: Milus Banister, MD;  [facility];   Service: Endoscopy;  Laterality: N/A;  possible stricture  . Balloon dilation N/A 10/04/2015    Procedure: BALLOON DILATION;  Surgeon: Milus Banister, MD;  [facility];  Service: Endoscopy;  Laterality: N/A;  . Esophagogastroduodenoscopy (egd) with propofol N/A 12/10/2015    Procedure: ESOPHAGOGASTRODUODENOSCOPY (EGD) WITH PROPOFOL;  Surgeon: Gatha Mayer, MD;  [facility];  Service: Endoscopy;  Laterality: N/A;  . Balloon dilation N/A 12/10/2015    Procedure: BALLOON DILATION;  Surgeon: Gatha Mayer, MD;  [facility];  Service: Endoscopy;  Laterality: N/A;  . Melanoma excision  02-01-16    abdominal wall  . Esophagogastroduodenoscopy (egd) with propofol N/A 02/06/2016    Procedure: ESOPHAGOGASTRODUODENOSCOPY (EGD) WITH PROPOFOL;  Surgeon: Gatha Mayer, MD;  [facility];  Service: Endoscopy;  Laterality: N/A;  . Savory dilation N/A 02/06/2016    Procedure: SAVORY DILATION;  Surgeon: Gatha Mayer, MD;  [facility];  Service: Endoscopy;  Laterality: N/A;  . Colonoscopy    . Upper gastrointestinal endoscopy      There were no vitals filed for this visit.      Subjective Assessment - 04/25/16 0801    Subjective I have been doing very well.  My only issue has been difficulty with bursts to run while playing tennis   Currently in Pain? No/denies                         [facility] Adult PT Treatment/Exercise - 04/25/16 0001    Ambulation/Gait   Gait Comments jog up hill x 4 at 60 feet, side shuffles with ball tosses   High Level Balance   High Level Balance Comments bosu standing with ball toss   Knee/Hip Exercises: Aerobic   Elliptical R=7 I = 10 x 5 minutes   Knee/Hip Exercises: Machines for Strengthening   Cybex Knee Extension 10# 2x15   Cybex Knee Flexion 35# 2x15   Cybex Leg Press 40# 2x10, 20# single legs x10 each   Hip Cybex 5# hips extension and abduction 2x10 each   Other Machine seated rows 25# 2x10, lats 25#  2x10, 35# straight arm pull downs, 10# chest press   Knee/Hip Exercises: Sidelying   Other Sidelying Knee/Hip Exercises planks 20 seconds x 3 reps                PT Education - 04/25/16 0838    Education provided Yes   Education Details went over all exercises for gym safety and how to return to activities safely   Person(s) Educated Patient   Methods Explanation;Demonstration   Comprehension Verbalized understanding          PT Short Term Goals - 03/18/16 0942    PT SHORT TERM GOAL #1   Title independent with initial HEP   Status Achieved           PT Long Term Goals - 04/25/16  0844    PT LONG TERM GOAL #1   Title return to playing tennis 2x/week   Status Achieved   PT LONG TERM GOAL #2   Title ncrease UE and LE strength to 4/5   Status Achieved   PT LONG TERM GOAL #3   Title tolerate 60 minutes of exercise   Status Achieved   PT LONG TERM GOAL #4   Title be able to do single leg stance > 15 seconds   Status Achieved               Plan - 05-22-2016 0843    Clinical Impression Statement Patient doing great, has good understanding of how to continue with activities at gym safely and how to add exercises safely   PT Next Visit Plan discharge   Consulted and Agree with Plan of Care Patient      Patient will benefit from skilled therapeutic intervention in order to improve the following deficits and impairments:  Abnormal gait, Cardiopulmonary status limiting activity, Decreased activity tolerance, Decreased mobility, Decreased endurance, Decreased range of motion, Difficulty walking  Visit Diagnosis: Muscle weakness (generalized)  Difficulty in walking, not elsewhere classified       G-Codes - 05-22-16 0844    Functional Assessment Tool Used foto 4% limitation   Functional Limitation Mobility: Walking and moving around   Mobility: Walking and Moving Around Current Status [phone]) At least 1 percent but less than 20 percent impaired, limited or  restricted   Mobility: Walking and Moving Around Goal Status [phone]) At least 40 percent but less than 60 percent impaired, limited or restricted   Mobility: Walking and Moving Around Discharge Status [phone]) At least 1 percent but less than 20 percent impaired, limited or restricted      Problem List Patient Active Problem List   Diagnosis Date Noted  . Malignant melanoma ([place]) 01/08/2016  . Acute esophagitis   . Dysphagia   . Anastomotic stricture of stomach   . Esophageal stricture   . 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 Gastric cancer ([place]) 06/08/2015  . B12 deficiency 06/05/2015  . Prostate cancer ([place]) 10/24/2014  . Unspecified vitamin D deficiency 06/03/2014  . History of duodenal ulcer 12/16/2013  . History of transfusion 12/16/2013  . Basal cell cancer 09/17/2012  . HEARING DEFICIT 12/21/2009  . NONSPECIFIC ABNORMAL ELECTROCARDIOGRAM 12/21/2009  . FASTING HYPERGLYCEMIA 12/04/2008  . History of colonic polyps 12/04/2008  . HYPERLIPIDEMIA 08/23/2007    Sumner Boast., PT May 22, 2016, 8:46 AM  [address] [place], [place], [address] Phone: [phone]   Fax:  [phone]  Name: James [name] MRN: [id number] Date of Birth: 1946/07/26

## 2016-04-28 ENCOUNTER — Ambulatory Visit: Payer: Medicare Other | Admitting: Physical Therapy

## 2016-05-02 DIAGNOSIS — C61 Malignant neoplasm of prostate: Secondary | ICD-10-CM | POA: Diagnosis not present

## 2016-05-02 DIAGNOSIS — N5201 Erectile dysfunction due to arterial insufficiency: Secondary | ICD-10-CM | POA: Diagnosis not present

## 2016-05-15 DIAGNOSIS — B351 Tinea unguium: Secondary | ICD-10-CM | POA: Diagnosis not present

## 2016-05-15 DIAGNOSIS — D2272 Melanocytic nevi of left lower limb, including hip: Secondary | ICD-10-CM | POA: Diagnosis not present

## 2016-05-15 DIAGNOSIS — L570 Actinic keratosis: Secondary | ICD-10-CM | POA: Diagnosis not present

## 2016-05-15 DIAGNOSIS — L821 Other seborrheic keratosis: Secondary | ICD-10-CM | POA: Diagnosis not present

## 2016-05-15 DIAGNOSIS — L723 Sebaceous cyst: Secondary | ICD-10-CM | POA: Diagnosis not present

## 2016-05-15 DIAGNOSIS — Z85828 Personal history of other malignant neoplasm of skin: Secondary | ICD-10-CM | POA: Diagnosis not present

## 2016-05-15 DIAGNOSIS — Z85820 Personal history of malignant melanoma of skin: Secondary | ICD-10-CM | POA: Diagnosis not present

## 2016-05-23 ENCOUNTER — Ambulatory Visit: Payer: Medicare Other | Admitting: Nutrition

## 2016-05-23 ENCOUNTER — Ambulatory Visit (HOSPITAL_BASED_OUTPATIENT_CLINIC_OR_DEPARTMENT_OTHER): Payer: Medicare Other | Admitting: Oncology

## 2016-05-23 ENCOUNTER — Other Ambulatory Visit (HOSPITAL_BASED_OUTPATIENT_CLINIC_OR_DEPARTMENT_OTHER): Payer: Medicare Other

## 2016-05-23 VITALS — BP 120/71 | HR 56 | Temp 98.4°F | Resp 18 | Ht 66.5 in | Wt 155.8 lb

## 2016-05-23 DIAGNOSIS — E538 Deficiency of other specified B group vitamins: Secondary | ICD-10-CM | POA: Diagnosis not present

## 2016-05-23 DIAGNOSIS — C160 Malignant neoplasm of cardia: Secondary | ICD-10-CM | POA: Diagnosis not present

## 2016-05-23 DIAGNOSIS — D701 Agranulocytosis secondary to cancer chemotherapy: Secondary | ICD-10-CM | POA: Diagnosis not present

## 2016-05-23 DIAGNOSIS — C61 Malignant neoplasm of prostate: Secondary | ICD-10-CM

## 2016-05-23 DIAGNOSIS — D696 Thrombocytopenia, unspecified: Secondary | ICD-10-CM | POA: Diagnosis not present

## 2016-05-23 DIAGNOSIS — C165 Malignant neoplasm of lesser curvature of stomach, unspecified: Secondary | ICD-10-CM

## 2016-05-23 DIAGNOSIS — C161 Malignant neoplasm of fundus of stomach: Secondary | ICD-10-CM

## 2016-05-23 LAB — CBC WITH DIFFERENTIAL/PLATELET
BASO%: 0.5 % (ref 0.0–2.0)
Basophils Absolute: 0 10*3/uL (ref 0.0–0.1)
EOS ABS: 0.2 10*3/uL (ref 0.0–0.5)
EOS%: 5.5 % (ref 0.0–7.0)
HCT: 35 % — ABNORMAL LOW (ref 38.4–49.9)
HGB: 11.6 g/dL — ABNORMAL LOW (ref 13.0–17.1)
LYMPH%: 23.5 % (ref 14.0–49.0)
MCH: 32.6 pg (ref 27.2–33.4)
MCHC: 33.1 g/dL (ref 32.0–36.0)
MCV: 98.5 fL — AB (ref 79.3–98.0)
MONO#: 0.4 10*3/uL (ref 0.1–0.9)
MONO%: 13.4 % (ref 0.0–14.0)
NEUT#: 1.7 10*3/uL (ref 1.5–6.5)
NEUT%: 57.1 % (ref 39.0–75.0)
PLATELETS: 108 10*3/uL — AB (ref 140–400)
RBC: 3.56 10*6/uL — AB (ref 4.20–5.82)
RDW: 14.2 % (ref 11.0–14.6)
WBC: 3 10*3/uL — ABNORMAL LOW (ref 4.0–10.3)
lymph#: 0.7 10*3/uL — ABNORMAL LOW (ref 0.9–3.3)

## 2016-05-23 NOTE — Progress Notes (Signed)
Brief follow up with patient and wife requested by Dr. Benay Spice. Patient has had a 7 pound weight loss over the past 3 months.  He is very active playing tennis and golf.  He is eating well, usually 3 meals and 2 snacks.  He has not tolerated Ensure Enlive lately and would like an alternative.  Educated patient on strategies for increasing calories in meals and snacks.  Provided some recipes for smoothies. Recommended patient try Glucerna, Boost Glucose Control or another low sugar oral nutrition supplement. Reviewed dumping syndrome and strategies to avoid.  Questions answered and teach back method used. Patient will contact me with further questions.

## 2016-05-23 NOTE — Progress Notes (Signed)
  [place] OFFICE PROGRESS NOTE   Diagnosis: Gastric cancer  INTERVAL HISTORY:   James [name] appears well. He is exercising. He has been unable to gain weight, but is eating 3 meals per day. He has discomfort in the abdomen when he is lying on the left side.  Objective:  Vital signs in last 24 hours:  Blood pressure 120/71, pulse (!) 56, temperature 98.4 F (36.9 C), temperature source Oral, resp. rate 18, height 5' 6.5" (1.689 m), weight 155 lb 12.8 oz (70.7 kg), SpO2 100 %.    HEENT: Neck without mass Lymphatics: No cervical, supraclavicular, axillary, or inguinal nodes Resp: Lungs clear bilaterally Cardio: Regular rate and rhythm GI: No hepatosplenomegaly, no mass, firm tissue immediately inferior and to the left of the xiphoid with associated tenderness. Vascular: No leg edema     Lab Results:  Lab Results  Component Value Date   WBC 3.0 (L) 05/23/2016   HGB 11.6 (L) 05/23/2016   HCT 35.0 (L) 05/23/2016   MCV 98.5 (H) 05/23/2016   PLT 108 (L) 05/23/2016   NEUTROABS 1.7 05/23/2016      Medications: I have reviewed the patient's current medications.  Assessment/Plan: 1. Gastric cancer-adenocarcinoma of the gastric fundus/cardia ? Staging CT scans 06/07/2015 with no evidence of distant metastatic disease, tiny nonspecific pulmonary nodules and liver lesions-likely benign ? Subtotal gastrectomy and placement of a jejunostomy feeding tube 07/18/2015 for a T2,N0 tumor with negative surgical margins ? Initiation of adjuvant 5-FU/leucovorin 08/28/2015, status post 3 weekly treatments completed 09/13/2015 ? Initiation of radiation and concurrent Xeloda 10/29/2015 ? 11/26/2015 Xeloda dose reduced to 1000 mg twice daily due to progressive thrombocytopenia ? Radiation completed 12/06/2015 ? 5-FU/leucovorin resumed 12/21/2015   2. History of microcytic anemia-likely iron deficiency anemia secondary to #1  3. Vitamin B-12 deficiency-Low-level  confirmed 01/04/2016, maintained on oral vitamin B-12 replacement  4. 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 Anastomotic stricture-status post balloon dilatation 08/22/2015, status post [facility]incision dilation at [facility] 10/11/2015, repeat EGD/ dilatation by Dr. Carlean Purl 11/01/2015 and 12/10/2015  7. Mild neutropenia/thrombocytopenia secondary to chemotherapy. Progressive thrombocytopenia 11/26/2015. Xeloda dose reduced to 1000 mg twice daily. Counts stable to improved 01/04/2016.  Persistent pancytopenia-secondary to chemotherapy versus vitamin B 12 deficiency, improved  8.Erythema/tenderness at the jejunostomy feeding tube site 09/20/2015, improved  9. Feeding tube removed 01/04/2016  10.  Melanoma left lower abdominal wall status post  wide excision.     Disposition:  James [name] is in clinical remission from gastric cancer. He has persistent mild thrombocytopenia. This may still be a residual of chemotherapy and radiation. He will contact us for bleeding. He will return for an office visit and CBC in 4 months.  He met with the Colon nutritionist today.  Betsy Coder, MD  05/23/2016  10:42 AM

## 2016-05-24 ENCOUNTER — Encounter: Payer: Self-pay | Admitting: Internal Medicine

## 2016-05-24 DIAGNOSIS — D696 Thrombocytopenia, unspecified: Secondary | ICD-10-CM | POA: Insufficient documentation

## 2016-05-27 ENCOUNTER — Telehealth: Payer: Self-pay | Admitting: Oncology

## 2016-05-27 NOTE — Telephone Encounter (Signed)
CALLED PATIENT TO CONF APPTS PER 05/23/16 LOS. L/M. APPT LTR AND SCHD MAILED. 05/27/16

## 2016-06-19 ENCOUNTER — Ambulatory Visit: Payer: Medicare Other | Admitting: Gastroenterology

## 2016-06-19 ENCOUNTER — Ambulatory Visit: Payer: Medicare Other | Admitting: Nurse Practitioner

## 2016-07-05 DIAGNOSIS — Z23 Encounter for immunization: Secondary | ICD-10-CM | POA: Diagnosis not present

## 2016-07-22 ENCOUNTER — Ambulatory Visit: Payer: Self-pay | Admitting: Radiation Oncology

## 2016-08-07 DIAGNOSIS — Z961 Presence of intraocular lens: Secondary | ICD-10-CM | POA: Diagnosis not present

## 2016-08-21 DIAGNOSIS — L814 Other melanin hyperpigmentation: Secondary | ICD-10-CM | POA: Diagnosis not present

## 2016-08-21 DIAGNOSIS — Z85828 Personal history of other malignant neoplasm of skin: Secondary | ICD-10-CM | POA: Diagnosis not present

## 2016-08-21 DIAGNOSIS — L853 Xerosis cutis: Secondary | ICD-10-CM | POA: Diagnosis not present

## 2016-08-21 DIAGNOSIS — D692 Other nonthrombocytopenic purpura: Secondary | ICD-10-CM | POA: Diagnosis not present

## 2016-08-21 DIAGNOSIS — L821 Other seborrheic keratosis: Secondary | ICD-10-CM | POA: Diagnosis not present

## 2016-08-21 DIAGNOSIS — L570 Actinic keratosis: Secondary | ICD-10-CM | POA: Diagnosis not present

## 2016-08-21 DIAGNOSIS — Z85820 Personal history of malignant melanoma of skin: Secondary | ICD-10-CM | POA: Diagnosis not present

## 2016-08-21 DIAGNOSIS — D1801 Hemangioma of skin and subcutaneous tissue: Secondary | ICD-10-CM | POA: Diagnosis not present

## 2016-08-21 DIAGNOSIS — L720 Epidermal cyst: Secondary | ICD-10-CM | POA: Diagnosis not present

## 2016-08-21 DIAGNOSIS — D2262 Melanocytic nevi of left upper limb, including shoulder: Secondary | ICD-10-CM | POA: Diagnosis not present

## 2016-08-21 DIAGNOSIS — D225 Melanocytic nevi of trunk: Secondary | ICD-10-CM | POA: Diagnosis not present

## 2016-08-26 ENCOUNTER — Other Ambulatory Visit: Payer: Self-pay | Admitting: Internal Medicine

## 2016-09-16 ENCOUNTER — Telehealth: Payer: Self-pay | Admitting: Oncology

## 2016-09-16 ENCOUNTER — Ambulatory Visit (HOSPITAL_BASED_OUTPATIENT_CLINIC_OR_DEPARTMENT_OTHER): Payer: Medicare Other | Admitting: Oncology

## 2016-09-16 ENCOUNTER — Ambulatory Visit (HOSPITAL_BASED_OUTPATIENT_CLINIC_OR_DEPARTMENT_OTHER): Payer: Medicare Other

## 2016-09-16 VITALS — BP 110/54 | HR 79 | Temp 98.2°F | Resp 17 | Ht 66.5 in | Wt 153.6 lb

## 2016-09-16 DIAGNOSIS — C161 Malignant neoplasm of fundus of stomach: Secondary | ICD-10-CM

## 2016-09-16 DIAGNOSIS — D701 Agranulocytosis secondary to cancer chemotherapy: Secondary | ICD-10-CM

## 2016-09-16 DIAGNOSIS — D500 Iron deficiency anemia secondary to blood loss (chronic): Secondary | ICD-10-CM | POA: Diagnosis not present

## 2016-09-16 DIAGNOSIS — R1310 Dysphagia, unspecified: Secondary | ICD-10-CM | POA: Diagnosis not present

## 2016-09-16 DIAGNOSIS — C160 Malignant neoplasm of cardia: Secondary | ICD-10-CM

## 2016-09-16 LAB — CBC WITH DIFFERENTIAL/PLATELET
BASO%: 0.3 % (ref 0.0–2.0)
BASOS ABS: 0 10*3/uL (ref 0.0–0.1)
EOS%: 2.8 % (ref 0.0–7.0)
Eosinophils Absolute: 0.1 10*3/uL (ref 0.0–0.5)
HCT: 24.4 % — ABNORMAL LOW (ref 38.4–49.9)
HGB: 8.2 g/dL — ABNORMAL LOW (ref 13.0–17.1)
LYMPH%: 16.7 % (ref 14.0–49.0)
MCH: 32.8 pg (ref 27.2–33.4)
MCHC: 33.6 g/dL (ref 32.0–36.0)
MCV: 97.6 fL (ref 79.3–98.0)
MONO#: 0.3 10*3/uL (ref 0.1–0.9)
MONO%: 10.2 % (ref 0.0–14.0)
NEUT#: 2.3 10*3/uL (ref 1.5–6.5)
NEUT%: 70 % (ref 39.0–75.0)
Platelets: 99 10*3/uL — ABNORMAL LOW (ref 140–400)
RBC: 2.5 10*6/uL — ABNORMAL LOW (ref 4.20–5.82)
RDW: 16.4 % — ABNORMAL HIGH (ref 11.0–14.6)
WBC: 3.2 10*3/uL — ABNORMAL LOW (ref 4.0–10.3)
lymph#: 0.5 10*3/uL — ABNORMAL LOW (ref 0.9–3.3)

## 2016-09-16 NOTE — Progress Notes (Signed)
[place] OFFICE PROGRESS NOTE   Diagnosis: Gastric cancer  INTERVAL HISTORY:   James [name] returns as scheduled. He reports dysphagia on the when he does not chew solids well. He has noted dark stools for the past 2 weeks. He reports the stool is black/brown. No other complaint. He is playing tennis. He was seen at the [place] on 08/26/2016. A CBC down the hemoglobin 11.6, MCV 93.2, platelets 110,000, and the white count returned at 2.66. The methylmalonic acid level returned in the normal range at 129. The transferrin saturation was normal at 35.1%.  Objective:  Vital signs in last 24 hours:  Blood pressure (!) 110/54, pulse 79, temperature 98.2 F (36.8 C), temperature source Oral, resp. rate 17, height 5' 6.5" (1.689 m), weight 153 lb 9.6 oz (69.7 kg), SpO2 99 %.    HEENT: Neck without mass, the conjunctiva are pale Lymphatics: No cervical, supraclavicular, axillary, or inguinal nodes Resp: Coarse rhonchi at the left posterior chest, no respiratory distress Cardio: Regular rate and rhythm GI: No hepatosplenomegaly, no mass, nontender Vascular: No leg edema  Lab Results:  Lab Results  Component Value Date   WBC 3.2 (L) 09/16/2016   HGB 8.2 (L) 09/16/2016   HCT 24.4 (L) 09/16/2016   MCV 97.6 09/16/2016   PLT 99 (L) 09/16/2016   NEUTROABS 2.3 09/16/2016     Medications: I have reviewed the patient's current medications.  Assessment/Plan: 1. Gastric cancer-adenocarcinoma of the gastric fundus/cardia ? Staging CT scans 06/07/2015 with no evidence of distant metastatic disease, tiny nonspecific pulmonary nodules and liver lesions-likely benign ? Subtotal gastrectomy and placement of a jejunostomy feeding tube 07/18/2015 for a T2,N0 tumor with negative surgical margins ? Initiation of adjuvant 5-FU/leucovorin 08/28/2015, status post 3 weekly treatments completed 09/13/2015 ? Initiation of radiation and concurrent Xeloda 10/29/2015 ? 11/26/2015 Xeloda dose  reduced to 1000 mg twice daily due to progressive thrombocytopenia ? Radiation completed 12/06/2015 ? 5-FU/leucovorin resumed 12/21/2015   2. History of microcytic anemia-likely iron deficiency anemia secondary to #1  Progressive anemia 09/16/2016-GI bleeding?  3. Vitamin B-12 deficiency-Low-level confirmed 01/04/2016, maintained on oral vitamin B-12 replacement  4. 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 Anastomotic stricture-status post balloon dilatation 08/22/2015, status post electroincision dilation at [facility] 10/11/2015, repeat EGD/ dilatation by Dr. Carlean Purl 11/01/2015 and 12/10/2015  7. Mild neutropenia/thrombocytopenia secondary to chemotherapy. Progressive thrombocytopenia 11/26/2015. Xeloda dose reduced to 1000 mg twice daily. Counts stable to improved 01/04/2016.  Persistent pancytopenia-secondary to chemotherapy versus vitamin B 12 deficiency  8.Erythema/tenderness at the jejunostomy feeding tube site 09/20/2015, improved  9. Feeding tube removed 01/04/2016  10. Melanoma left lower abdominal wall status post  wide excision.    Disposition:  James [name] remains in clinical remission from gastric cancer. He has experienced an acute drop in the hemoglobin compared to a CBC obtained at the [facility] on 08/26/2016. He reports dark stool. We will arrange for a repeat CBC and red cell transfusion as indicated on 09/18/2016. He will return a set of stool Hemoccult cards on 09/18/2016. We will schedule upon with Dr. Carlean Purl if the stool is heme positive.  James [name] will contact us for symptoms of anemia. He will return for an office visit in one month. We will see him sooner as needed.  He has persistent lymphopenia and thrombocytopenia following the course of chemotherapy and radiation. I suspect these cytopenias are treatment  related.  Betsy Coder, MD  09/16/2016  4:41 PM

## 2016-09-16 NOTE — Telephone Encounter (Signed)
Blood transfusion scheduled @ [facility] for 09/18/16 @ 11 a.m. Per Andria Frames. Labs also scheduled for 09/18/16 @ 9:30 a.m. Appointments scheduled per 09/16/16 los. A copy of the AVS report and appointment schedule, was given to the patient, per 09/16/16 los.

## 2016-09-17 ENCOUNTER — Telehealth: Payer: Self-pay | Admitting: *Deleted

## 2016-09-17 ENCOUNTER — Ambulatory Visit (HOSPITAL_COMMUNITY)
Admission: RE | Admit: 2016-09-17 | Discharge: 2016-09-17 | Disposition: A | Discharge status: Home / Self Care | Payer: Medicare Other | Source: Ambulatory Visit | Attending: Oncology | Admitting: Oncology

## 2016-09-17 ENCOUNTER — Other Ambulatory Visit: Payer: Self-pay | Admitting: *Deleted

## 2016-09-17 DIAGNOSIS — D500 Iron deficiency anemia secondary to blood loss (chronic): Secondary | ICD-10-CM | POA: Insufficient documentation

## 2016-09-17 NOTE — Telephone Encounter (Signed)
-----   Message from Festus Holts sent at 09/16/2016  4:16 PM EST ----- Regarding: PLEASE ADD TRANSFUSION Please add transfusion per 09/16/16 los.   Thanks

## 2016-09-17 NOTE — Progress Notes (Signed)
OK - let me know about hemoccults  I am off starting Thurs PM but not "gone" until Saturday - and the next week  We will do what is needed - if heme + will set up an EGD I suspect

## 2016-09-18 ENCOUNTER — Ambulatory Visit (HOSPITAL_BASED_OUTPATIENT_CLINIC_OR_DEPARTMENT_OTHER): Payer: Medicare Other

## 2016-09-18 ENCOUNTER — Ambulatory Visit (HOSPITAL_COMMUNITY)
Admission: RE | Admit: 2016-09-18 | Discharge: 2016-09-18 | Disposition: A | Discharge status: Home / Self Care | Payer: Medicare Other | Source: Ambulatory Visit | Attending: Internal Medicine | Admitting: Internal Medicine

## 2016-09-18 DIAGNOSIS — D500 Iron deficiency anemia secondary to blood loss (chronic): Secondary | ICD-10-CM | POA: Diagnosis not present

## 2016-09-18 DIAGNOSIS — C161 Malignant neoplasm of fundus of stomach: Secondary | ICD-10-CM | POA: Diagnosis present

## 2016-09-18 LAB — CBC WITH DIFFERENTIAL/PLATELET
BASO%: 0.5 % (ref 0.0–2.0)
BASOS ABS: 0 10*3/uL (ref 0.0–0.1)
EOS%: 3.2 % (ref 0.0–7.0)
Eosinophils Absolute: 0.1 10*3/uL (ref 0.0–0.5)
HCT: 22.9 % — ABNORMAL LOW (ref 38.4–49.9)
HEMOGLOBIN: 7.7 g/dL — AB (ref 13.0–17.1)
LYMPH%: 26.8 % (ref 14.0–49.0)
MCH: 32.4 pg (ref 27.2–33.4)
MCHC: 33.6 g/dL (ref 32.0–36.0)
MCV: 96.2 fL (ref 79.3–98.0)
MONO#: 0.3 10*3/uL (ref 0.1–0.9)
MONO%: 13.2 % (ref 0.0–14.0)
NEUT#: 1.2 10*3/uL — ABNORMAL LOW (ref 1.5–6.5)
NEUT%: 56.3 % (ref 39.0–75.0)
Platelets: 95 10*3/uL — ABNORMAL LOW (ref 140–400)
RBC: 2.38 10*6/uL — AB (ref 4.20–5.82)
RDW: 16.2 % — AB (ref 11.0–14.6)
WBC: 2.2 10*3/uL — ABNORMAL LOW (ref 4.0–10.3)
lymph#: 0.6 10*3/uL — ABNORMAL LOW (ref 0.9–3.3)

## 2016-09-18 LAB — FECAL OCCULT BLOOD, GUAIAC: Occult Blood: POSITIVE

## 2016-09-18 LAB — PREPARE RBC (CROSSMATCH)

## 2016-09-18 MED ORDER — SODIUM CHLORIDE 0.9 % IV SOLN: 250.0000 mL | Freq: Once | INTRAVENOUS | Status: DC

## 2016-09-18 NOTE — Progress Notes (Signed)
He needs an EGD [facility] ok  I can do when I am back vs asking a partner to do  Dx: heme + anemia/melena

## 2016-09-18 NOTE — Progress Notes (Signed)
Patient arrived to [facility] for transfusion of 2 units of PRBC. No reaction noted. Vital signs remained stable through out the procedure. IV access was established and discontinued. Pt is ambulatory. Discharge education and  instructions provided. Patient discharged to home.  James [name], [facility]

## 2016-09-18 NOTE — Progress Notes (Signed)
Am out of off and unavailable - Have sent info to my RN already to see about getting him an EGD  I will cc dan to see if he can help also - he dilated his stricture in the past

## 2016-09-19 LAB — TYPE AND SCREEN
BLOOD PRODUCT EXPIRATION DATE: 201712212359
Blood Product Expiration Date: 201712282359
ISSUE DATE / TIME: 201712141031
ISSUE DATE / TIME: 201712141031
UNIT TYPE AND RH: 9500
Unit Type and Rh: 9500

## 2016-09-23 ENCOUNTER — Ambulatory Visit (AMBULATORY_SURGERY_CENTER): Payer: Self-pay | Admitting: *Deleted

## 2016-09-23 ENCOUNTER — Telehealth: Payer: Self-pay

## 2016-09-23 ENCOUNTER — Other Ambulatory Visit: Payer: Self-pay | Admitting: *Deleted

## 2016-09-23 VITALS — Ht 67.0 in | Wt 152.0 lb

## 2016-09-23 DIAGNOSIS — C160 Malignant neoplasm of cardia: Secondary | ICD-10-CM

## 2016-09-23 DIAGNOSIS — R195 Other fecal abnormalities: Secondary | ICD-10-CM

## 2016-09-23 DIAGNOSIS — C169 Malignant neoplasm of stomach, unspecified: Secondary | ICD-10-CM

## 2016-09-23 NOTE — Progress Notes (Signed)
No egg or soy allergy. No anesthesia problems.  No home O2.  No diet meds.  

## 2016-09-23 NOTE — Telephone Encounter (Signed)
Pt had 2 units blood on 12/14. He had heme + stools on 12/14. He has appt for endoscopy on 12/28 with Dr Carlean Purl. He has been having off and on pain in his abdomen. Last night and today his stomach has been hurting after every time he eats. He has labs scheduled 12/20 at 0830. Wife is asking that he be seen after labs. S/w Cyndee bacon and he will see [facility]. inbasket sent. cmet and t&h added to labs.

## 2016-09-24 ENCOUNTER — Other Ambulatory Visit (HOSPITAL_BASED_OUTPATIENT_CLINIC_OR_DEPARTMENT_OTHER): Payer: Medicare Other

## 2016-09-24 ENCOUNTER — Encounter: Payer: Medicare Other | Admitting: Nurse Practitioner

## 2016-09-24 DIAGNOSIS — C161 Malignant neoplasm of fundus of stomach: Secondary | ICD-10-CM

## 2016-09-24 DIAGNOSIS — C160 Malignant neoplasm of cardia: Secondary | ICD-10-CM

## 2016-09-24 DIAGNOSIS — C169 Malignant neoplasm of stomach, unspecified: Secondary | ICD-10-CM

## 2016-09-24 LAB — CBC WITH DIFFERENTIAL/PLATELET
BASO%: 0.4 % (ref 0.0–2.0)
BASOS ABS: 0 10*3/uL (ref 0.0–0.1)
EOS ABS: 0.1 10*3/uL (ref 0.0–0.5)
EOS%: 4.6 % (ref 0.0–7.0)
HEMATOCRIT: 31.6 % — AB (ref 38.4–49.9)
HEMOGLOBIN: 10.7 g/dL — AB (ref 13.0–17.1)
LYMPH#: 0.6 10*3/uL — AB (ref 0.9–3.3)
LYMPH%: 24.5 % (ref 14.0–49.0)
MCH: 31.3 pg (ref 27.2–33.4)
MCHC: 33.9 g/dL (ref 32.0–36.0)
MCV: 92.4 fL (ref 79.3–98.0)
MONO#: 0.4 10*3/uL (ref 0.1–0.9)
MONO%: 15.2 % — ABNORMAL HIGH (ref 0.0–14.0)
NEUT#: 1.3 10*3/uL — ABNORMAL LOW (ref 1.5–6.5)
NEUT%: 55.3 % (ref 39.0–75.0)
PLATELETS: 79 10*3/uL — AB (ref 140–400)
RBC: 3.42 10*6/uL — ABNORMAL LOW (ref 4.20–5.82)
RDW: 16.1 % — AB (ref 11.0–14.6)
WBC: 2.4 10*3/uL — ABNORMAL LOW (ref 4.0–10.3)

## 2016-09-24 LAB — COMPREHENSIVE METABOLIC PANEL
ALT: 11 U/L (ref 0–55)
ANION GAP: 6 meq/L (ref 3–11)
AST: 21 U/L (ref 5–34)
Albumin: 3.9 g/dL (ref 3.5–5.0)
Alkaline Phosphatase: 80 U/L (ref 40–150)
BUN: 13.3 mg/dL (ref 7.0–26.0)
CHLORIDE: 106 meq/L (ref 98–109)
CO2: 26 meq/L (ref 22–29)
CREATININE: 0.7 mg/dL (ref 0.7–1.3)
Calcium: 8.9 mg/dL (ref 8.4–10.4)
Glucose: 97 mg/dl (ref 70–140)
Potassium: 4 mEq/L (ref 3.5–5.1)
Sodium: 139 mEq/L (ref 136–145)
Total Bilirubin: 0.61 mg/dL (ref 0.20–1.20)
Total Protein: 6.6 g/dL (ref 6.4–8.3)

## 2016-09-24 NOTE — Telephone Encounter (Signed)
Received call from lab. Pt states he feels better today and is cancelling appt in [facility] for today. Reviewed labs. Has EGD scheduled for 10/02/16 with Dr. Carlean Purl

## 2016-10-02 ENCOUNTER — Encounter: Payer: Self-pay | Admitting: Internal Medicine

## 2016-10-02 ENCOUNTER — Other Ambulatory Visit (INDEPENDENT_AMBULATORY_CARE_PROVIDER_SITE_OTHER): Payer: Medicare Other

## 2016-10-02 ENCOUNTER — Ambulatory Visit (AMBULATORY_SURGERY_CENTER): Payer: Medicare Other | Admitting: Internal Medicine

## 2016-10-02 VITALS — BP 128/62 | HR 52 | Temp 97.3°F | Resp 18 | Ht 67.0 in | Wt 152.0 lb

## 2016-10-02 DIAGNOSIS — K31819 Angiodysplasia of stomach and duodenum without bleeding: Secondary | ICD-10-CM | POA: Diagnosis not present

## 2016-10-02 DIAGNOSIS — K222 Esophageal obstruction: Secondary | ICD-10-CM | POA: Diagnosis not present

## 2016-10-02 DIAGNOSIS — R195 Other fecal abnormalities: Secondary | ICD-10-CM

## 2016-10-02 DIAGNOSIS — K209 Esophagitis, unspecified: Secondary | ICD-10-CM | POA: Diagnosis not present

## 2016-10-02 DIAGNOSIS — K921 Melena: Secondary | ICD-10-CM

## 2016-10-02 DIAGNOSIS — K2951 Unspecified chronic gastritis with bleeding: Secondary | ICD-10-CM | POA: Diagnosis not present

## 2016-10-02 DIAGNOSIS — K219 Gastro-esophageal reflux disease without esophagitis: Secondary | ICD-10-CM | POA: Diagnosis not present

## 2016-10-02 LAB — CBC WITH DIFFERENTIAL/PLATELET
BASOS ABS: 0 10*3/uL (ref 0.0–0.1)
Basophils Relative: 0.3 % (ref 0.0–3.0)
EOS ABS: 0.1 10*3/uL (ref 0.0–0.7)
Eosinophils Relative: 3.3 % (ref 0.0–5.0)
HEMATOCRIT: 29.6 % — AB (ref 39.0–52.0)
HEMOGLOBIN: 10.2 g/dL — AB (ref 13.0–17.0)
LYMPHS PCT: 20.1 % (ref 12.0–46.0)
Lymphs Abs: 0.5 10*3/uL — ABNORMAL LOW (ref 0.7–4.0)
MCHC: 34.4 g/dL (ref 30.0–36.0)
MCV: 92.6 fl (ref 78.0–100.0)
Monocytes Absolute: 0.3 10*3/uL (ref 0.1–1.0)
Monocytes Relative: 12.1 % — ABNORMAL HIGH (ref 3.0–12.0)
Neutro Abs: 1.6 10*3/uL (ref 1.4–7.7)
Neutrophils Relative %: 64.2 % (ref 43.0–77.0)
PLATELETS: 106 10*3/uL — AB (ref 150.0–400.0)
RBC: 3.19 Mil/uL — AB (ref 4.22–5.81)
RDW: 16.6 % — ABNORMAL HIGH (ref 11.5–15.5)
WBC: 2.6 10*3/uL — AB (ref 4.0–10.5)

## 2016-10-02 MED ORDER — SODIUM CHLORIDE 0.9 % IV SOLN: 500.0000 mL | INTRAVENOUS | Status: DC

## 2016-10-02 NOTE — Progress Notes (Signed)
Specimens were sent [facility] per Dr. Wilkie Aye request.  MAW

## 2016-10-02 NOTE — Progress Notes (Signed)
Report to PACU, RN, vss, BBS= Clear.  

## 2016-10-02 NOTE — Progress Notes (Signed)
Called to room to assist during endoscopic procedure.  Patient ID and intended procedure confirmed with present staff. Received instructions for my participation in the procedure from the performing physician.  

## 2016-10-02 NOTE — Op Note (Signed)
[place] Patient Name: James [name] Procedure Date: 10/02/2016 1:36 PM MRN: [id number] Endoscopist: Gatha Mayer , MD Age: 70 Referring MD:  Date of Birth: May 03, 1946 Gender: Male Account #: [id number] Procedure:                Upper GI endoscopy Indications:              [place] Medicines:                Propofol per Anesthesia, Monitored Anesthesia Care Procedure:                Pre-Anesthesia Assessment:                           - Prior to the procedure, a History and Physical                            was performed, and patient medications and                            allergies were reviewed. The patient's tolerance of                            previous anesthesia was also reviewed. The risks                            and benefits of the procedure and the sedation                            options and risks were discussed with the patient.                            All questions were answered, and informed consent                            was obtained. Prior Anticoagulants: The patient has                            taken no previous anticoagulant or antiplatelet                            agents. ASA Grade Assessment: II - A patient with                            mild systemic disease. After reviewing the risks                            and benefits, the patient was deemed in                            satisfactory condition to undergo the procedure.                           After obtaining informed consent, the endoscope was  passed under direct vision. Throughout the                            procedure, the patient's blood pressure, pulse, and                            oxygen saturations were monitored continuously. The                            Model GIF-HQ190 [id number]) scope was introduced                            through the mouth, and advanced to the second part                            of duodenum. The upper GI  endoscopy was                            accomplished without difficulty. The patient                            tolerated the procedure well. Scope In: Scope Out: Findings:                 Multiple small bleeding angioectasias were found in                            the gastric antrum. Biopsies were taken with a cold                            forceps for histology. Verification of patient                            identification for the specimen was done. Estimated                            blood loss was minimal.                           Evidence of a previous surgical anastomosis was                            found in the cardia, in the gastric fundus and in                            the gastric body.                           One moderate post-surgical stenosis was found. This                            measured 1.3 cm (inner diameter) and was traversed.                            Biopsies were taken  with a cold forceps for                            histology. Verification of patient identification                            for the specimen was done. Estimated blood loss was                            minimal.                           The exam was otherwise without abnormality. Complications:            No immediate complications. Estimated Blood Loss:     Estimated blood loss was minimal. Impression:               - Multiple bleeding angioectasias in the stomach.                            Biopsied.                           - A previous surgical anastomosis was found.                           - Esophageal stenosis. Biopsied.                           - The examination was otherwise normal. Recommendation:           - Patient has a contact number available for                            emergencies. The signs and symptoms of potential                            delayed complications were discussed with the                            patient. Return to normal activities  tomorrow.                            Written discharge instructions were provided to the                            patient.                           - Resume previous diet.                           - Continue present medications.                           - Await pathology results.                           -  I THINK HE WILL NEED ABLATIVE THERAPY OF THE                            ANGIOECTASIA (PROBABLY FROM XRT) IN THE STOMACH.                           WILL GET A CBC AGAIN TODAY ALSO. Gatha Mayer, MD 10/02/2016 2:11:17 PM This report has been signed electronically.

## 2016-10-02 NOTE — Patient Instructions (Addendum)
You are leaking blood from the stomach - I think it is related to radiation damage that we sometimes see. There was an inflamed area at the anastomosis also - took biopsies to check.  I think we will need to cauterize the stomach lining to stop the blood from leaking.  I will let you know soon.  Will get a blood count today also.  I appreciate the opportunity to care for you. Gatha Mayer, MD, FACG YOU HAD AN ENDOSCOPIC PROCEDURE TODAY AT [facility]:   Refer to the procedure report that was given to you for any specific questions about what was found during the examination.  If the procedure report does not answer your questions, please call your gastroenterologist to clarify.  If you requested that your care partner not be given the details of your procedure findings, then the procedure report has been included in a sealed envelope for you to review at your convenience later.  YOU SHOULD EXPECT: Some feelings of bloating in the abdomen. Passage of more gas than usual.  Walking can help get rid of the air that was put into your GI tract during the procedure and reduce the bloating. If you had a lower endoscopy (such as a colonoscopy or flexible sigmoidoscopy) you may notice spotting of blood in your stool or on the toilet paper. If you underwent a bowel prep for your procedure, you may not have a normal bowel movement for a few days.  Please Note:  You might notice some irritation and congestion in your nose or some drainage.  This is from the oxygen used during your procedure.  There is no need for concern and it should clear up in a day or so.  SYMPTOMS TO REPORT IMMEDIATELY:   Following upper endoscopy (EGD)  Vomiting of blood or coffee ground material  New chest pain or pain under the shoulder blades  Painful or persistently difficult swallowing  New shortness of breath  Fever of 100F or higher  Black, tarry-looking stools  For urgent or emergent issues,  a gastroenterologist can be reached at any hour by calling [phone].   DIET:  We do recommend a small meal at first, but then you may proceed to your regular diet.  Drink plenty of fluids but you should avoid alcoholic beverages for 24 hours.  ACTIVITY:  You should plan to take it easy for the rest of today and you should NOT DRIVE or use heavy machinery until tomorrow (because of the sedation medicines used during the test).    FOLLOW UP: Our staff will call the number listed on your records the next business day following your procedure to check on you and address any questions or concerns that you may have regarding the information given to you following your procedure. If we do not reach you, we will leave a message.  However, if you are feeling well and you are not experiencing any problems, there is no need to return our call.  We will assume that you have returned to your regular daily activities without incident.  If any biopsies were taken you will be contacted by phone or by letter within the next 1-3 weeks.  Please call us at [phone] if you have not heard about the biopsies in 3 weeks.    SIGNATURES/CONFIDENTIALITY: You and/or your care partner have signed paperwork which will be entered into your electronic medical record.  These signatures attest to the fact that that the  information above on your After Visit Summary has been reviewed and is understood.  Full responsibility of the confidentiality of this discharge information lies with you and/or your care-partner.

## 2016-10-03 ENCOUNTER — Other Ambulatory Visit: Payer: Self-pay

## 2016-10-03 ENCOUNTER — Telehealth: Payer: Self-pay | Admitting: *Deleted

## 2016-10-03 DIAGNOSIS — K31819 Angiodysplasia of stomach and duodenum without bleeding: Secondary | ICD-10-CM

## 2016-10-03 DIAGNOSIS — K222 Esophageal obstruction: Secondary | ICD-10-CM

## 2016-10-03 NOTE — Telephone Encounter (Signed)
  Follow up Call-  Call back number 10/02/2016 04/11/2016 11/01/2015 08/29/2015 08/22/2015 06/05/2015  Post procedure Call Back phone  # [phone]  Permission to leave phone message Yes Yes Yes Yes Yes Yes  Some recent data might be hidden     Patient questions:  Do you have a fever, pain , or abdominal swelling? No. Pain Score  0 *  Have you tolerated food without any problems? Yes.    Have you been able to return to your normal activities? Yes.    Do you have any questions about your discharge instructions: Diet   No. Medications  No. Follow up visit  No.  Do you have questions or concerns about your Care? No.  Actions: * If pain score is 4 or above: No action needed, pain <4.  Stated I did great"

## 2016-10-03 NOTE — Progress Notes (Signed)
Tell hom hgb was down just slightly Also - I have not seen gastric and esophageal biopsies yet but suggest we go ahead and book a slot for MAC EGD w/ balloon esophageal dilation and APC tx of gastric AVM's next week at hospital Thx

## 2016-10-06 HISTORY — PX: COLONOSCOPY: SHX174

## 2016-10-07 ENCOUNTER — Telehealth: Payer: Self-pay | Admitting: Internal Medicine

## 2016-10-07 ENCOUNTER — Encounter (HOSPITAL_COMMUNITY): Payer: Self-pay | Admitting: *Deleted

## 2016-10-07 NOTE — Progress Notes (Signed)
No cancer on biopsies Plan for ablation later this week

## 2016-10-08 NOTE — Telephone Encounter (Signed)
See lab results for additional details.  

## 2016-10-10 ENCOUNTER — Ambulatory Visit (HOSPITAL_COMMUNITY): Payer: Medicare Other | Admitting: Anesthesiology

## 2016-10-10 ENCOUNTER — Encounter (HOSPITAL_COMMUNITY)
Admission: RE | Disposition: A | Discharge status: Home / Self Care | Payer: Self-pay | Source: Ambulatory Visit | Attending: Internal Medicine

## 2016-10-10 ENCOUNTER — Other Ambulatory Visit: Payer: Self-pay

## 2016-10-10 ENCOUNTER — Encounter (HOSPITAL_COMMUNITY): Payer: Self-pay | Admitting: *Deleted

## 2016-10-10 ENCOUNTER — Ambulatory Visit (HOSPITAL_COMMUNITY)
Admission: RE | Admit: 2016-10-10 | Discharge: 2016-10-10 | Disposition: A | Discharge status: Home / Self Care | Payer: Medicare Other | Source: Ambulatory Visit | Attending: Internal Medicine | Admitting: Internal Medicine

## 2016-10-10 DIAGNOSIS — E785 Hyperlipidemia, unspecified: Secondary | ICD-10-CM | POA: Insufficient documentation

## 2016-10-10 DIAGNOSIS — K31811 Angiodysplasia of stomach and duodenum with bleeding: Secondary | ICD-10-CM

## 2016-10-10 DIAGNOSIS — R011 Cardiac murmur, unspecified: Secondary | ICD-10-CM | POA: Insufficient documentation

## 2016-10-10 DIAGNOSIS — Z885 Allergy status to narcotic agent status: Secondary | ICD-10-CM | POA: Insufficient documentation

## 2016-10-10 DIAGNOSIS — E538 Deficiency of other specified B group vitamins: Secondary | ICD-10-CM | POA: Insufficient documentation

## 2016-10-10 DIAGNOSIS — Z85820 Personal history of malignant melanoma of skin: Secondary | ICD-10-CM | POA: Insufficient documentation

## 2016-10-10 DIAGNOSIS — F329 Major depressive disorder, single episode, unspecified: Secondary | ICD-10-CM | POA: Insufficient documentation

## 2016-10-10 DIAGNOSIS — Z85028 Personal history of other malignant neoplasm of stomach: Secondary | ICD-10-CM | POA: Diagnosis not present

## 2016-10-10 DIAGNOSIS — K921 Melena: Secondary | ICD-10-CM

## 2016-10-10 DIAGNOSIS — D649 Anemia, unspecified: Secondary | ICD-10-CM | POA: Diagnosis not present

## 2016-10-10 DIAGNOSIS — D62 Acute posthemorrhagic anemia: Secondary | ICD-10-CM

## 2016-10-10 DIAGNOSIS — Z8711 Personal history of peptic ulcer disease: Secondary | ICD-10-CM | POA: Insufficient documentation

## 2016-10-10 DIAGNOSIS — Z8719 Personal history of other diseases of the digestive system: Secondary | ICD-10-CM | POA: Diagnosis not present

## 2016-10-10 DIAGNOSIS — K222 Esophageal obstruction: Secondary | ICD-10-CM | POA: Diagnosis not present

## 2016-10-10 DIAGNOSIS — R1310 Dysphagia, unspecified: Secondary | ICD-10-CM | POA: Diagnosis not present

## 2016-10-10 DIAGNOSIS — Z923 Personal history of irradiation: Secondary | ICD-10-CM | POA: Insufficient documentation

## 2016-10-10 DIAGNOSIS — K31819 Angiodysplasia of stomach and duodenum without bleeding: Secondary | ICD-10-CM

## 2016-10-10 DIAGNOSIS — Z87891 Personal history of nicotine dependence: Secondary | ICD-10-CM | POA: Insufficient documentation

## 2016-10-10 DIAGNOSIS — Z8546 Personal history of malignant neoplasm of prostate: Secondary | ICD-10-CM | POA: Diagnosis not present

## 2016-10-10 HISTORY — PX: ESOPHAGOGASTRODUODENOSCOPY (EGD) WITH PROPOFOL: SHX5813

## 2016-10-10 HISTORY — PX: HOT HEMOSTASIS: SHX5433

## 2016-10-10 HISTORY — PX: BALLOON DILATION: SHX5330

## 2016-10-10 SURGERY — ESOPHAGOGASTRODUODENOSCOPY (EGD) WITH PROPOFOL
Anesthesia: Monitor Anesthesia Care

## 2016-10-10 MED ORDER — FERROUS SULFATE 325 (65 FE) MG PO TABS: 325.0000 mg | ORAL_TABLET | Freq: Every day | ORAL | 3 refills | Status: DC

## 2016-10-10 MED ORDER — ONDANSETRON HCL 4 MG/2ML IJ SOLN
INTRAMUSCULAR | Status: AC
  Filled 2016-10-10: qty 2

## 2016-10-10 MED ORDER — PROPOFOL 10 MG/ML IV BOLUS
INTRAVENOUS | Status: DC | PRN
  Administered 2016-10-10 (×3): 20 mg via INTRAVENOUS
  Administered 2016-10-10: 50 mg via INTRAVENOUS

## 2016-10-10 MED ORDER — LACTATED RINGERS IV SOLN
INTRAVENOUS | Status: DC
  Administered 2016-10-10: 12:00:00 via INTRAVENOUS

## 2016-10-10 MED ORDER — ONDANSETRON HCL 4 MG/2ML IJ SOLN
4.0000 mg | Freq: Once | INTRAMUSCULAR | Status: AC
  Administered 2016-10-10: 4 mg via INTRAVENOUS

## 2016-10-10 MED ORDER — PROPOFOL 500 MG/50ML IV EMUL
INTRAVENOUS | Status: DC | PRN
  Administered 2016-10-10: 150 ug/kg/min via INTRAVENOUS

## 2016-10-10 MED ORDER — LIDOCAINE HCL (CARDIAC) 20 MG/ML IV SOLN
INTRAVENOUS | Status: DC | PRN
  Administered 2016-10-10: 100 mg via INTRATRACHEAL

## 2016-10-10 MED ORDER — PROPOFOL 10 MG/ML IV BOLUS
INTRAVENOUS | Status: AC
  Filled 2016-10-10: qty 40

## 2016-10-10 MED ORDER — SODIUM CHLORIDE 0.9 % IV SOLN: INTRAVENOUS | Status: DC

## 2016-10-10 MED ORDER — LIDOCAINE HCL 2 % IJ SOLN
INTRAMUSCULAR | Status: AC
  Filled 2016-10-10: qty 20

## 2016-10-10 SURGICAL SUPPLY — 14 items

## 2016-10-10 NOTE — Anesthesia Postprocedure Evaluation (Signed)
Anesthesia Post Note  Patient: James [name]  Procedure(s) Performed: Procedure(s) (LRB): ESOPHAGOGASTRODUODENOSCOPY (EGD) WITH PROPOFOL (N/A) BALLOON DILATION (N/A) HOT HEMOSTASIS (ARGON PLASMA COAGULATION/BICAP) (N/A)  Patient location during evaluation: Endoscopy Anesthesia Type: MAC Level of consciousness: awake and alert Pain management: pain level controlled Vital Signs Assessment: post-procedure vital signs reviewed and stable Respiratory status: spontaneous breathing and respiratory function stable Cardiovascular status: stable Anesthetic complications: no       Last Vitals:  Vitals:   10/10/16 1355 10/10/16 1400  BP: 128/75 (!) 149/76  Pulse: 68 60  Resp: 19 18  Temp:      Last Pain:  Vitals:   10/10/16 1400  TempSrc:   PainSc: 2                  [place]

## 2016-10-10 NOTE — Op Note (Signed)
[facility] Patient Name: James [name] Procedure Date: 10/10/2016 MRN: [id number] Attending MD: Gatha Mayer , MD Date of Birth: [birth year] CSN: [id number] Age: 71 Admit Type: Outpatient Procedure:                Upper GI endoscopy Indications:              Dysphagia, Melena Providers:                Gatha Mayer, MD, Cleda Daub, RN, Corliss Parish, Technician Referring MD:              Medicines:                Propofol per Anesthesia, Monitored Anesthesia Care Complications:            No immediate complications. Estimated Blood Loss:     Estimated blood loss was minimal. Procedure:                Pre-Anesthesia Assessment:                           - Prior to the procedure, a History and Physical                            was performed, and patient medications and                            allergies were reviewed. The patient's tolerance of                            previous anesthesia was also reviewed. The risks                            and benefits of the procedure and the sedation                            options and risks were discussed with the patient.                            All questions were answered, and informed consent                            was obtained. Prior Anticoagulants: The patient has                            taken no previous anticoagulant or antiplatelet                            agents. ASA Grade Assessment: III - A patient with                            severe systemic disease. After reviewing the risks  and benefits, the patient was deemed in                            satisfactory condition to undergo the procedure.                           After obtaining informed consent, the endoscope was                            passed under direct vision. Throughout the                            procedure, the patient's blood pressure, pulse, and      oxygen saturations were monitored continuously. The                            Endoscope was introduced through the mouth, and                            advanced to the second part of duodenum. The upper                            GI endoscopy was accomplished without difficulty.                            The patient tolerated the procedure well. Scope In: Scope Out: Findings:      One moderate (circumferential scarring or stenosis; an endoscope may       pass) benign-appearing, intrinsic stenosis was found at the       gastroesophageal junction. This measured 1.1 cm (inner diameter) x less       than one cm (in length) and was traversed. A TTS dilator was passed       through the scope. Dilation with a 15-16.5-18 mm balloon and an 18-19-20       mm balloon dilator was performed to 20 mm. The dilation site was       examined and showed moderate improvement in luminal narrowing. Estimated       blood loss was minimal.      Multiple small bleeding angioectasias were found in the entire examined       stomach. Coagulation for hemostasis using argon plasma was successful.       Estimated blood loss was minimal.      A deformity was found in the cardia.      The exam was otherwise without abnormality.      The cardia and gastric fundus were normal on retroflexion. Impression:               - Benign-appearing esophageal stenosis. Dilated.                           - Multiple bleeding angioectasias in the stomach.                            Treated with argon plasma coagulation (APC).                           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                           - The examination was otherwise normal.                           - No specimens collected. Moderate Sedation:      N/A- Per Anesthesia Care Recommendation:           - Patient has a contact number available for                            emergencies. The signs and symptoms of potential                             delayed complications were discussed with the                            patient. Return to normal activities tomorrow.                            Written discharge instructions were provided to the                            patient.                           - Clear liquids x 1 hour then soft foods rest of                            day. Start prior diet tomorrow.                           - Continue present medications.                           - Will cancel 1/11 appointment with me                           CBC and ferritin re: blood loss anemia 1/9 or 1/10                            at my office lab                           Rescope prn                           - Will need to stay be on ferrous sulfate and have                            periodic monitoring of CBC.                           May need parenteral iron?                           -  CC Drs. Julieanne Manson, John Moody Procedure Code(s):        --- Professional ---                           [phone], Esophagogastroduodenoscopy, flexible,                            transoral; with control of bleeding, any method                           43249, Esophagogastroduodenoscopy, flexible,                            transoral; with transendoscopic balloon dilation of                            esophagus (less than 30 mm diameter) Diagnosis Code(s):        --- Professional ---                           K22.2, Esophageal obstruction                           K31.811, Angiodysplasia of stomach and duodenum                            with bleeding                           K91.89, Other postprocedural complications and                            disorders of digestive system                           R13.10, Dysphagia, unspecified                           K92.1, Melena (includes Hematochezia) CPT copyright 2016 American Medical Association. All rights reserved. The codes documented in this report are preliminary and upon coder review  may  be revised to meet current compliance requirements. Gatha Mayer, MD 10/10/2016 1:43:36 PM This report has been signed electronically. Number of Addenda: 0

## 2016-10-10 NOTE — Transfer of Care (Signed)
Immediate Anesthesia Transfer of Care Note  Patient: James [name]  Procedure(s) Performed: Procedure(s) with comments: ESOPHAGOGASTRODUODENOSCOPY (EGD) WITH PROPOFOL (N/A) - needs APC  BALLOON DILATION (N/A) HOT HEMOSTASIS (ARGON PLASMA COAGULATION/BICAP) (N/A)  Patient Location: PACU  Anesthesia Type:MAC  Level of Consciousness: Patient easily awoken, sedated, comfortable, cooperative, following commands, responds to stimulation.   Airway & Oxygen Therapy: Patient spontaneously breathing, ventilating well, oxygen via simple oxygen mask.  Post-op Assessment: Report given to PACU RN, vital signs reviewed and stable, moving all extremities.   Post vital signs: Reviewed and stable.  Complications: No apparent anesthesia complications  Last Vitals:  Vitals:   10/10/16 1144  BP: 115/67  Pulse: 65  Resp: 15  Temp: 36.7 C    Last Pain:  Vitals:   10/10/16 1144  TempSrc: Oral         Complications: No apparent anesthesia complications

## 2016-10-10 NOTE — Discharge Instructions (Signed)
° °  I dilated the stricture to 20 mm and ablated the gastric lining where the abnormal blood vessels were.  I will cancel your 1/11 appointment.  Please come to the lab at my office 1/9 or 1/10 and have a blood count drawn.  I appreciate the opportunity to care for you. Gatha Mayer, MD, FACG  YOU HAD AN ENDOSCOPIC PROCEDURE TODAY: Refer to the procedure report and other information in the discharge instructions given to you for any specific questions about what was found during the examination. If this information does not answer your questions, please call Dr. Celesta Aver office at [phone] to clarify.   YOU SHOULD EXPECT: Some feelings of bloating in the abdomen. Passage of more gas than usual. Walking can help get rid of the air that was put into your GI tract during the procedure and reduce the bloating. If you had a lower endoscopy (such as a colonoscopy or flexible sigmoidoscopy) you may notice spotting of blood in your stool or on the toilet paper. Some abdominal soreness may be present for a day or two, also.  DIET:  Stay on liquids until 3 PM then try soft foods and advance as tolerated.  ACTIVITY: Your care partner should take you home directly after the procedure. You should plan to take it easy, moving slowly for the rest of the day. You can resume normal activity the day after the procedure however YOU SHOULD NOT DRIVE, use power tools, machinery or perform tasks that involve climbing or major physical exertion for 24 hours (because of the sedation medicines used during the test).   SYMPTOMS TO REPORT IMMEDIATELY: A gastroenterologist can be reached at any hour. Please call [phone]  for any of the following symptoms:  Following lower endoscopy (colonoscopy, flexible sigmoidoscopy) Excessive amounts of blood in the stool  Significant tenderness, worsening of abdominal pains  Swelling of the abdomen that is new, acute  Fever of 100 or higher  Following upper  endoscopy (EGD, EUS, ERCP, esophageal dilation) Vomiting of blood or coffee ground material  New, significant abdominal pain  New, significant chest pain or pain under the shoulder blades  Painful or persistently difficult swallowing  New shortness of breath  Black, tarry-looking or red, bloody stools  FOLLOW UP:  If any biopsies were taken you will be contacted by phone or by letter within the next 1-3 weeks. Call [phone]  if you have not heard about the biopsies in 3 weeks.  Please also call with any specific questions about appointments or follow up tests.

## 2016-10-10 NOTE — Progress Notes (Signed)
Patient complains of nausea with dry heaving. Dr. Carlean Purl at bedside. Zofran 4mg  IV x1 dose ordered, this was administered, patient reports some relief at this time.

## 2016-10-10 NOTE — H&P (Signed)
[facility] History and Physical   Primary Care Physician:  Binnie Rail, MD   Reason for Procedure:   dilate GE junction stricture and ablate gastric xrt angiodysplasias  Plan:    EGD, dilation, APC ablation The risks and benefits as well as alternatives of endoscopic procedure(s) have been discussed and reviewed. All questions answered. The patient agrees to proceed.      HPI: James [name] is a 71 y.o. 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  Past Medical History:  Diagnosis Date  . Abnormal EKG    NS ST-T EKG Changes, (-) Nuclear Stress Test 03/2006  . Allergy   . Anemia    iron deficency  . B12 deficiency 06/05/2015   B12 Low NL at 271 methtylmalonic acid high  . Cardiac murmur    nothing to be concerned with   . Cataract    extractions  . Duodenal ulcer 1972   transfusion required, 1 unit packed cells  . Dysphagia    history throat/stomach  cancer "can swallow some, uses more pureed type foods"  . Esophageal reflux    no currlently  . 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 Gastrostomy tube in place [facility])    intermittent feeding  . Hx of basal cell carcinoma   . Hyperlipemia    no per pt 04-11-16  . Melanoma ([place])   . Prostate cancer ([place]) 09/06/14   last radiation 4'16   . S/P radiation therapy 11/29/2014 through 01/23/2015    Prostate 7800 cGy in 40 sessions, seminal vesicles 5600 cGy in 40 sessions   . Skin cancer, basal cell   . Transfusion history    '72 -s/p surgery for duodenal ulcer, 12/17    Past Surgical History:  Procedure Laterality Date  . BALLOON DILATION N/A 09/12/2015   Procedure: BALLOON DILATION;  Surgeon: Gatha Mayer, MD;  Location: [facility];  Service: Endoscopy;  Laterality: N/A;  . BALLOON DILATION N/A 09/21/2015   Procedure: BALLOON DILATION;  Surgeon: Gatha Mayer, MD;  Location: [facility];  Service: Endoscopy;  Laterality: N/A;  . BALLOON DILATION N/A 10/04/2015   Procedure: BALLOON DILATION;  Surgeon: Milus Banister, MD;  Location: [facility];  Service: Endoscopy;  Laterality: N/A;  . BALLOON DILATION N/A 12/10/2015   Procedure: BALLOON DILATION;  Surgeon: Gatha Mayer, MD;  Location: [facility];  Service: Endoscopy;  Laterality: N/A;  . CATARACT EXTRACTION, BILATERAL Bilateral   . COLONOSCOPY    . colonoscopy with polypectomy  02/2012   Dr Sharlett Iles  . ESOPHAGOGASTRODUODENOSCOPY (EGD) WITH PROPOFOL N/A 08/17/2015   Procedure: ESOPHAGOGASTRODUODENOSCOPY (EGD) WITH PROPOFOL;  Surgeon: Gatha Mayer, MD;  Location: [facility];  Service: Endoscopy;  Laterality: N/A;  . ESOPHAGOGASTRODUODENOSCOPY (EGD) WITH PROPOFOL N/A 09/12/2015   Procedure: ESOPHAGOGASTRODUODENOSCOPY (EGD) WITH PROPOFOL;  Surgeon: Gatha Mayer, MD;  Location: [facility];  Service: Endoscopy;  Laterality: N/A;  . ESOPHAGOGASTRODUODENOSCOPY (EGD) WITH PROPOFOL N/A 09/21/2015   Procedure: ESOPHAGOGASTRODUODENOSCOPY (EGD) WITH PROPOFOL;  Surgeon: Gatha Mayer, MD;  Location: [facility];  Service: Endoscopy;  Laterality: N/A;  . ESOPHAGOGASTRODUODENOSCOPY (EGD) WITH PROPOFOL N/A 10/04/2015   Procedure: ESOPHAGOGASTRODUODENOSCOPY (EGD) WITH PROPOFOL;  Surgeon: Milus Banister, MD;  Location: [facility];  Service: Endoscopy;  Laterality: N/A;  possible stricture  . ESOPHAGOGASTRODUODENOSCOPY (EGD) WITH PROPOFOL N/A 12/10/2015   Procedure: ESOPHAGOGASTRODUODENOSCOPY (EGD) WITH PROPOFOL;  Surgeon: Gatha Mayer, MD;  [facility];  Service: Endoscopy;  Laterality: N/A;  . ESOPHAGOGASTRODUODENOSCOPY (EGD) WITH PROPOFOL N/A 02/06/2016   Procedure: ESOPHAGOGASTRODUODENOSCOPY (EGD) WITH PROPOFOL;  Surgeon: Gatha Mayer, MD;  Location: [facility];  Service: Endoscopy;  Laterality: N/A;  . EUS N/A 06/14/2015   Procedure: ESOPHAGEAL ENDOSCOPIC ULTRASOUND (EUS)  RADIAL;  Surgeon: Milus Banister, MD;  Location: [facility];  Service: Endoscopy;  Laterality: N/A;  . KNEE ARTHROSCOPY Right    Right Knee, [place] [facility]  . LAPAROSCOPIC GASTRECTOMY N/A 07/18/2015   Procedure: SUB TOTAL GASTRECTOMY;  Surgeon: Stark Klein, MD;  Location: [facility];  Service: General;  Laterality: N/A;  . LAPAROSCOPIC GASTROSTOMY N/A 07/18/2015   Procedure: FEEDING TUBE PLACEMENT;  Surgeon: Stark Klein, MD;  Location: [facility];  Service: General;  Laterality: N/A;  . LAPAROSCOPY N/A 07/18/2015   Procedure: LAPAROSCOPY DIAGNOSTIC;  Surgeon: Stark Klein, MD;  Location: [facility];  Service: General;  Laterality: N/A;  . MELANOMA EXCISION  02-01-16   abdominal wall  . PROSTATE BIOPSY  09/06/14  . ROTATOR CUFF REPAIR Right    R shoulder, [place]  . SAVORY DILATION N/A 02/06/2016   Procedure: SAVORY DILATION;  Surgeon: Gatha Mayer, MD;  Location: [facility];  Service: Endoscopy;  Laterality: N/A;  . TRIGGER FINGER RELEASE Left 1998  . UPPER GASTROINTESTINAL ENDOSCOPY     x14  . VASECTOMY      Prior to Admission medications   Medication Sig Start Date End Date Taking? Authorizing Provider  acetaminophen (TYLENOL) 500 MG tablet Take 500 mg by mouth every 6 (six) hours as needed (For pain.).   Yes Historical Provider, MD  cyanocobalamin 1000 MCG tablet Take 1,000 mcg by mouth daily.   Yes Historical Provider, MD  Multiple Vitamin (MULTIVITAMIN WITH MINERALS) TABS tablet Take 1 tablet by mouth daily.   Yes Historical Provider, MD  omeprazole (PRILOSEC) 40 MG capsule take 1 capsule by mouth once daily 30 MINUTES BEFORE BREAKFAST/SUPPER.  OKAY TO OPEN AND SPRINKLE ON APPLESAUCE 08/26/16  Yes Gatha Mayer, MD  pravastatin (PRAVACHOL) 40 MG tablet Take 1 tablet (40 mg total) by mouth daily. Patient taking differently: Take 40 mg by mouth at bedtime.  12/10/15  Yes Gatha Mayer, MD    Current Facility-Administered Medications  Medication Dose Route Frequency Provider Last Rate Last  Dose  . 0.9 %  sodium chloride infusion   Intravenous Continuous Gatha Mayer, MD      . lactated ringers infusion   Intravenous Continuous Duane Boston, MD 10 mL/hr at 10/10/16 1158      Allergies as of 10/03/2016 - Review Complete 10/02/2016  Allergen Reaction Noted  . Codeine Nausea And Vomiting     Family History  Problem Relation Age of Onset  . Diabetes Father   . Heart attack Father 84  . Liver disease Father     Related to Alcohol Use   . Alcohol abuse Mother   . Cirrhosis Mother     died in her 35s  . Multiple myeloma Brother   . Diabetes Sister   . Colon cancer Neg Hx   . Stomach cancer Neg Hx   . Stroke Neg Hx   . Esophageal cancer Neg Hx   . Rectal cancer Neg Hx     Social History   Social History  . Marital status: Married    Spouse name: N/A  . Number of children: 2  . Years of education: N/A   Occupational History  . retired  Social History Main Topics  . Smoking status: Former Smoker    Packs/day: 0.50    Years: 5.00    Types: Cigarettes    Quit date: 10/07/1967  . Smokeless tobacco: Never Used     Comment: smoked [place] , up to < 1 ppd  . Alcohol use No  . Drug use: No  . Sexual activity: Not on file   Other Topics Concern  . Not on file   Social History Narrative   Married and retired - 1 son and 1 daughter    Lives at [place] and active golfer, tennis player   05/30/2015       Review of Systems:  All other review of systems negative except as mentioned in the HPI.  Physical Exam: Vital signs in last 24 hours: Temp:  [98 F (36.7 C)] 98 F (36.7 C) (01/05 1144) Pulse Rate:  [65] 65 (01/05 1144) Resp:  [15] 15 (01/05 1144) BP: (115)/(67) 115/67 (01/05 1144) SpO2:  [99 %] 99 % (01/05 1144) Weight:  [150 lb (68 kg)] 150 lb (68 kg) (01/05 1144)   General:   Alert,  Well-developed, well-nourished, pleasant and cooperative in NAD Lungs:  Clear throughout to auscultation.   Heart:  Regular rate and rhythm; no murmurs,  clicks, rubs,  or gallops. Abdomen:  Soft, nontender and nondistended. Normal bowel sounds.   Neuro/Psych:  Alert and cooperative. Normal mood and affect. A and O x 3   @Tameca Jerez  Simonne Maffucci, MD, [facility] [phone] (pager) 10/10/2016 12:52 PM@

## 2016-10-10 NOTE — Anesthesia Preprocedure Evaluation (Signed)
Anesthesia Evaluation  Patient identified by MRN, date of birth, ID band Patient awake    Reviewed: Allergy & Precautions, H&P , NPO status , Patient's Chart, lab work & pertinent test results  History of Anesthesia Complications Negative for: history of anesthetic complications  Airway Mallampati: II  TM Distance: >3 FB Neck ROM: full    Dental no notable dental hx. (+) Dental Advisory Given, Caps,    Pulmonary asthma , former smoker,    Pulmonary exam normal breath sounds clear to auscultation       Cardiovascular Exercise Tolerance: Good (-) hypertension(-) angina(-) Past MI negative cardio ROS Normal cardiovascular exam+ Valvular Problems/Murmurs  Rhythm:regular Rate:Normal  Normal Stress 2008   Neuro/Psych Depression negative neurological ROS  negative psych ROS   GI/Hepatic Neg liver ROS, PUD, GERD  Medicated and Controlled,Stomach cancer,SP resection    Endo/Other  negative endocrine ROS  Renal/GU negative Renal ROS  negative genitourinary   Musculoskeletal negative musculoskeletal ROS (+)   Abdominal   Peds  Hematology  (+) Blood dyscrasia, anemia , 9/28   Anesthesia Other Findings   Reproductive/Obstetrics                             Anesthesia Physical  Anesthesia Plan  ASA: III  Anesthesia Plan: MAC   Post-op Pain Management:    Induction: Intravenous  Airway Management Planned: Nasal Cannula  Additional Equipment:   Intra-op Plan:   Post-operative Plan:   Informed Consent: I have reviewed the patients History and Physical, chart, labs and discussed the procedure including the risks, benefits and alternatives for the proposed anesthesia with the patient or authorized representative who has indicated his/her understanding and acceptance.   Dental advisory given  Plan Discussed with:   Anesthesia Plan Comments:         Anesthesia Quick Evaluation

## 2016-10-13 ENCOUNTER — Telehealth: Payer: Self-pay | Admitting: Internal Medicine

## 2016-10-13 ENCOUNTER — Encounter (HOSPITAL_COMMUNITY): Payer: Self-pay | Admitting: Internal Medicine

## 2016-10-13 NOTE — Telephone Encounter (Signed)
Patient notified to come for labs 1/9 or 1/10

## 2016-10-15 ENCOUNTER — Ambulatory Visit (HOSPITAL_BASED_OUTPATIENT_CLINIC_OR_DEPARTMENT_OTHER): Payer: Medicare Other | Admitting: Nurse Practitioner

## 2016-10-15 ENCOUNTER — Other Ambulatory Visit (HOSPITAL_BASED_OUTPATIENT_CLINIC_OR_DEPARTMENT_OTHER): Payer: Medicare Other

## 2016-10-15 VITALS — BP 124/70 | HR 63 | Temp 98.5°F | Resp 18 | Wt 153.4 lb

## 2016-10-15 DIAGNOSIS — E538 Deficiency of other specified B group vitamins: Secondary | ICD-10-CM

## 2016-10-15 DIAGNOSIS — C61 Malignant neoplasm of prostate: Secondary | ICD-10-CM | POA: Diagnosis not present

## 2016-10-15 DIAGNOSIS — D500 Iron deficiency anemia secondary to blood loss (chronic): Secondary | ICD-10-CM

## 2016-10-15 DIAGNOSIS — C161 Malignant neoplasm of fundus of stomach: Secondary | ICD-10-CM | POA: Diagnosis present

## 2016-10-15 DIAGNOSIS — C165 Malignant neoplasm of lesser curvature of stomach, unspecified: Secondary | ICD-10-CM

## 2016-10-15 LAB — CBC WITH DIFFERENTIAL/PLATELET
BASO%: 0.5 % (ref 0.0–2.0)
BASOS ABS: 0 10*3/uL (ref 0.0–0.1)
EOS ABS: 0.1 10*3/uL (ref 0.0–0.5)
EOS%: 3.4 % (ref 0.0–7.0)
HEMATOCRIT: 29.6 % — AB (ref 38.4–49.9)
HEMOGLOBIN: 10 g/dL — AB (ref 13.0–17.1)
LYMPH#: 0.7 10*3/uL — AB (ref 0.9–3.3)
LYMPH%: 23.9 % (ref 14.0–49.0)
MCH: 31.1 pg (ref 27.2–33.4)
MCHC: 33.9 g/dL (ref 32.0–36.0)
MCV: 91.8 fL (ref 79.3–98.0)
MONO#: 0.3 10*3/uL (ref 0.1–0.9)
MONO%: 11.3 % (ref 0.0–14.0)
NEUT#: 1.7 10*3/uL (ref 1.5–6.5)
NEUT%: 60.9 % (ref 39.0–75.0)
Platelets: 122 10*3/uL — ABNORMAL LOW (ref 140–400)
RBC: 3.23 10*6/uL — ABNORMAL LOW (ref 4.20–5.82)
RDW: 16.3 % — AB (ref 11.0–14.6)
WBC: 2.8 10*3/uL — ABNORMAL LOW (ref 4.0–10.3)

## 2016-10-15 NOTE — Progress Notes (Addendum)
[place] OFFICE PROGRESS NOTE   Diagnosis:  Gastric cancer  INTERVAL HISTORY:   James [name] returns as scheduled. He underwent an upper endoscopy on 10/02/2016 with findings of multiple small bleeding angioectasias in the gastric antrum. Biopsies were obtained with gastric antrum biopsy showing chronic gastritis with focal intestinal metaplasia and esophageal gastric anastomosis biopsy showing acute and chronic inflammation with intestinal metaplasia. No malignancy was identified. Esophageal stenosis noted. On 10/10/2016 he underwent an upper endoscopy with findings of a benign-appearing intrinsic stenosis at the gastroesophageal junction which was dilated. Multiple small bleeding angioectasias were found in the entire examined stomach, treated with argon plasma coagulation.  James [name] is feeling much better. No further dysphagia. He is tolerating a regular diet. Appetite has improved. No dark stools. He is taking ferrous sulfate 325 mg daily.  Objective:  Vital signs in last 24 hours:  Blood pressure 124/70, pulse 63, temperature 98.5 F (36.9 C), temperature source Oral, resp. rate 18, weight 153 lb 6.4 oz (69.6 kg), SpO2 100 %.    HEENT: No thrush or ulcers. Resp: Lungs clear bilaterally. Cardio: Regular rate and rhythm. GI: Abdomen soft and nontender. No organomegaly. No mass. Vascular: No leg edema.    Lab Results:  Lab Results  Component Value Date   WBC 2.8 (L) 10/15/2016   HGB 10.0 (L) 10/15/2016   HCT 29.6 (L) 10/15/2016   MCV 91.8 10/15/2016   PLT 122 (L) 10/15/2016   NEUTROABS 1.7 10/15/2016    Imaging:  No results found.  Medications: I have reviewed the patient's current medications.  Assessment/Plan: 1. Gastric cancer-adenocarcinoma of the gastric fundus/cardia ? Staging CT scans 06/07/2015 with no evidence of distant metastatic disease, tiny nonspecific pulmonary nodules and liver lesions-likely benign ? Subtotal gastrectomy and  placement of a jejunostomy feeding tube 07/18/2015 for a T2,N0 tumor with negative surgical margins ? Initiation of adjuvant 5-FU/leucovorin 08/28/2015, status post 3 weekly treatments completed 09/13/2015 ? Initiation of radiation and concurrent Xeloda 10/29/2015 ? 11/26/2015 Xeloda dose reduced to 1000 mg twice daily due to progressive thrombocytopenia ? Radiation completed 12/06/2015 ? 5-FU/leucovorin resumed 12/21/2015   2. History of microcytic anemia-likely iron deficiency anemia secondary to #1  Progressive anemia 09/16/2016-GI bleeding?  Upper endoscopy 10/02/2016 with findings of multiple small bleeding angioectasias status post argon plasma coagulation on 10/10/2016  3. Vitamin B-12 deficiency-Low-level confirmed 01/04/2016, maintained on oral vitamin B-12 replacement  4. 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 Carlean Purl 11/01/2015 and 12/10/2015; EGD 10/02/2016 with finding of esophageal stenosis at the gastroesophageal junction status post dilation 10/10/2016  7. Mild neutropenia/thrombocytopenia secondary to chemotherapy. Progressive thrombocytopenia 11/26/2015. Xeloda dose reduced to 1000 mg twice daily. Counts stable to improved 01/04/2016.  Persistent pancytopenia-secondary to chemotherapy versus vitamin B 12 deficiency  8.Erythema/tenderness at the jejunostomy feeding tube site 09/20/2015, improved  9. Feeding tube removed 01/04/2016  10. Melanoma left lower abdominal wall status post wide excision.   Disposition: James [name] appears stable. Recent upper endoscopy showed multiple small bleeding angioectasias. He underwent plasma argon coagulation on 10/10/2016. Hemoglobin has been  stable over the past 3 weeks. He will increase ferrous sulfate from once daily to twice daily. He will return for a repeat CBC in 3 weeks. We will follow-up on the ferritin from today. If the hemoglobin has  not improved significantly in 3 weeks we will consider IV iron. He will return for labs and a follow-up visit in 6 weeks.  Patient seen with Dr. Benay Spice.  James [name] ANP/GNP-BC   10/15/2016  12:16 PM  this was a shared visit with James [name]. James [name] was noted to have probable radiation-induced oozing from the stomach on endoscopy 10/10/2016. The hemoglobin is stable. He will increase the oral iron and return for a CBC in 3 weeks.  James [name], M.D.

## 2016-10-16 ENCOUNTER — Ambulatory Visit: Payer: Medicare Other | Admitting: Internal Medicine

## 2016-10-17 DIAGNOSIS — M25512 Pain in left shoulder: Secondary | ICD-10-CM | POA: Diagnosis not present

## 2016-10-17 DIAGNOSIS — K222 Esophageal obstruction: Secondary | ICD-10-CM | POA: Diagnosis not present

## 2016-10-17 DIAGNOSIS — M7542 Impingement syndrome of left shoulder: Secondary | ICD-10-CM | POA: Diagnosis not present

## 2016-10-17 DIAGNOSIS — G8929 Other chronic pain: Secondary | ICD-10-CM | POA: Diagnosis not present

## 2016-10-17 DIAGNOSIS — C161 Malignant neoplasm of fundus of stomach: Secondary | ICD-10-CM | POA: Diagnosis not present

## 2016-10-19 ENCOUNTER — Telehealth: Payer: Self-pay | Admitting: Oncology

## 2016-10-19 NOTE — Telephone Encounter (Signed)
S/w pt's wife, gave appts for 11/05/16 + 11/27/16.

## 2016-10-21 DIAGNOSIS — C61 Malignant neoplasm of prostate: Secondary | ICD-10-CM | POA: Diagnosis not present

## 2016-10-27 DIAGNOSIS — N5201 Erectile dysfunction due to arterial insufficiency: Secondary | ICD-10-CM | POA: Diagnosis not present

## 2016-10-27 DIAGNOSIS — C61 Malignant neoplasm of prostate: Secondary | ICD-10-CM | POA: Diagnosis not present

## 2016-10-27 DIAGNOSIS — N486 Induration penis plastica: Secondary | ICD-10-CM | POA: Diagnosis not present

## 2016-10-28 IMAGING — CT CT CHEST W/ CM
2 of 5 series · 16 of 46 positions shown, 18 images · IV contrast (OMNIPAQUE)
Comparison: 06/07/2015.  08/15/2015 upper GI.

CLINICAL DATA: Gastric cancer diagnosed [DATE]. Chemotherapy
complete. Radiation to start [DATE]. Gastric surgery. Trouble
swallowing since 09/03/2015. Feeding tube.

EXAM:
EXAM
CT CHEST AND ABDOMEN WITH CONTRAST
TECHNIQUE: Multidetector CT imaging of the chest and abdomen was performed
following the standard protocol during bolus administration of
intravenous contrast.
CONTRAST:  100mL OMNIPAQUE IOHEXOL 300 MG/ML  SOLN

[Series 2: ca with st · axial · 0.73mm/px · z∈[+987,+1377]mm · 13 of 90 slices shown, 15 images]
[im 6/90  soft-tissue]
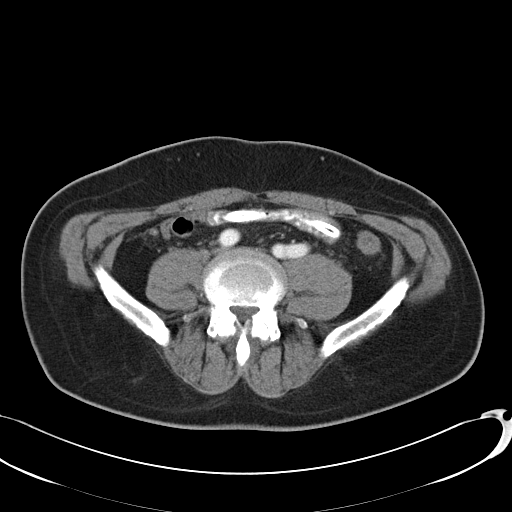
[im 6/90  bone]
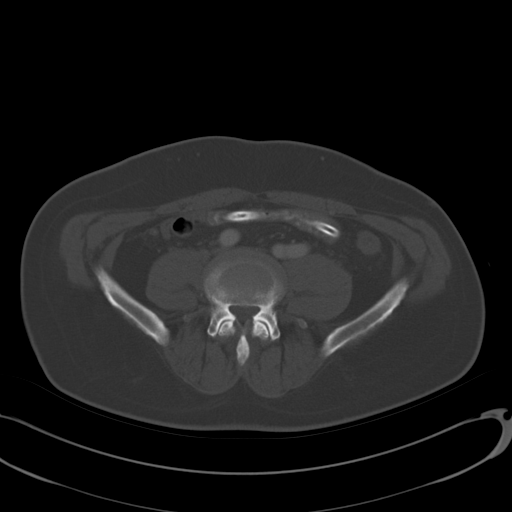
[im 12/90  soft-tissue]
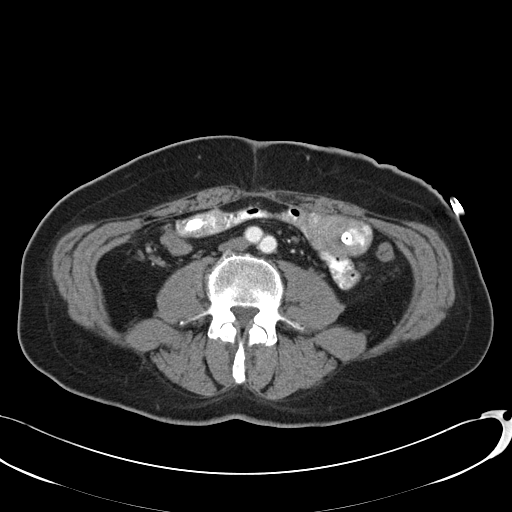
[im 18/90  soft-tissue]
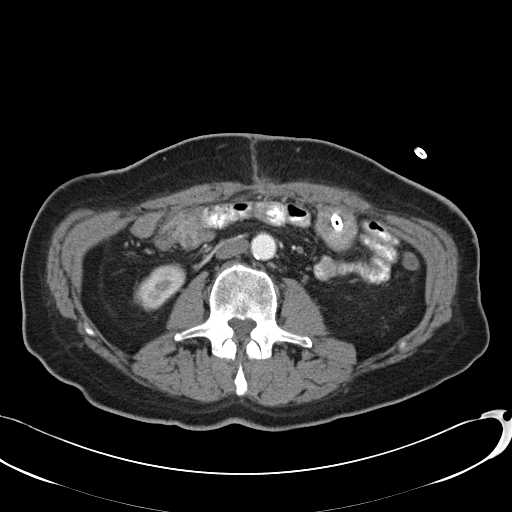
[im 24/90  soft-tissue]
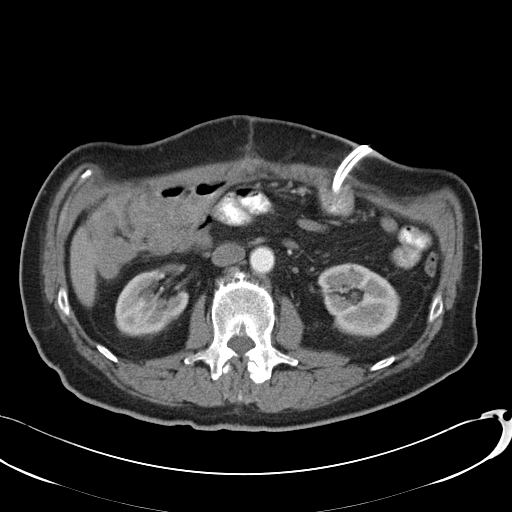
[im 30/90  soft-tissue]
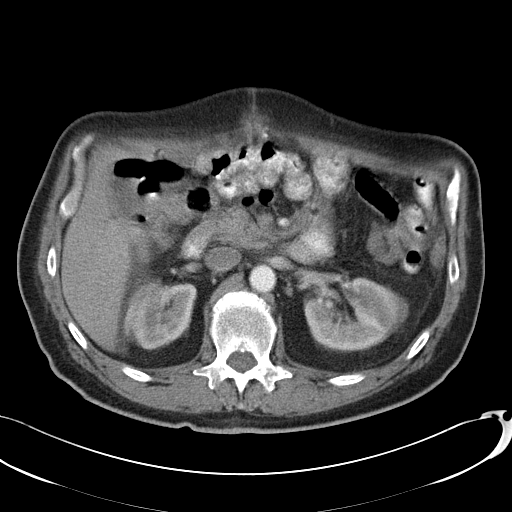
[im 36/90  soft-tissue]
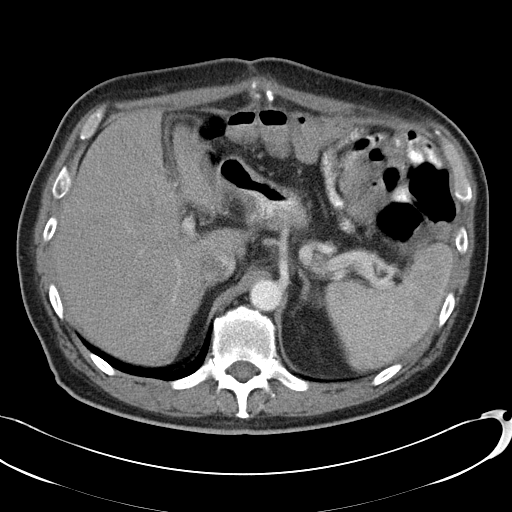
[im 48/90  soft-tissue]
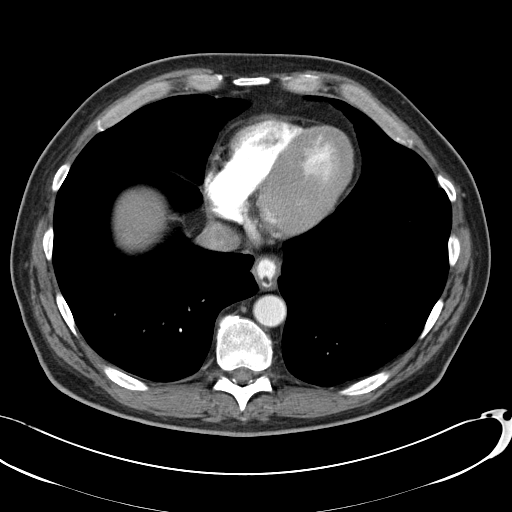
[im 54/90  soft-tissue]
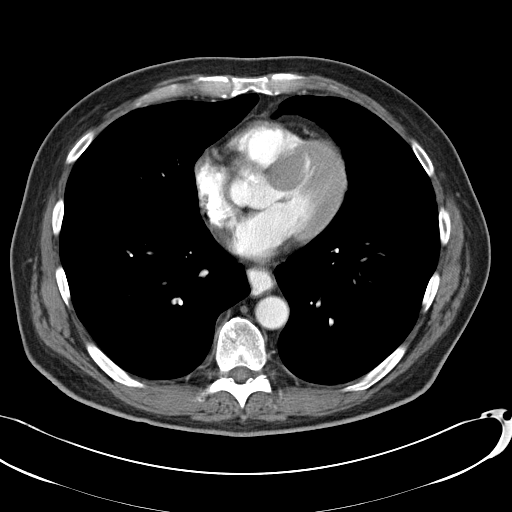
[im 60/90  soft-tissue]
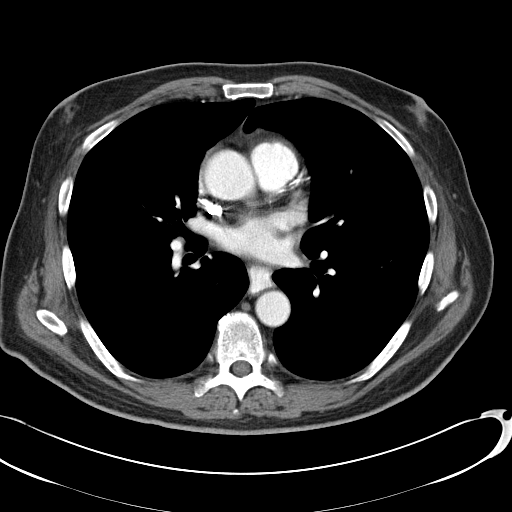
[im 60/90  bone]
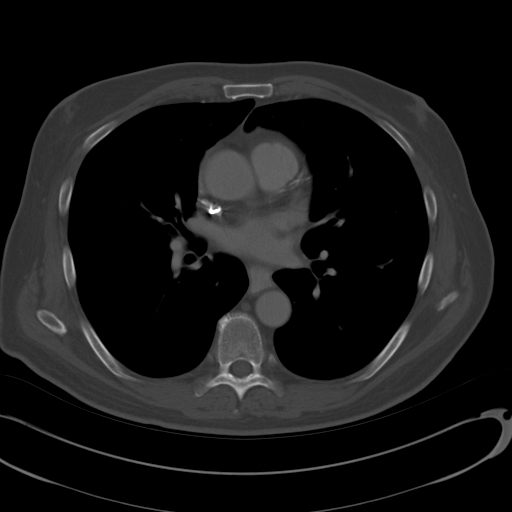
[im 66/90  soft-tissue]
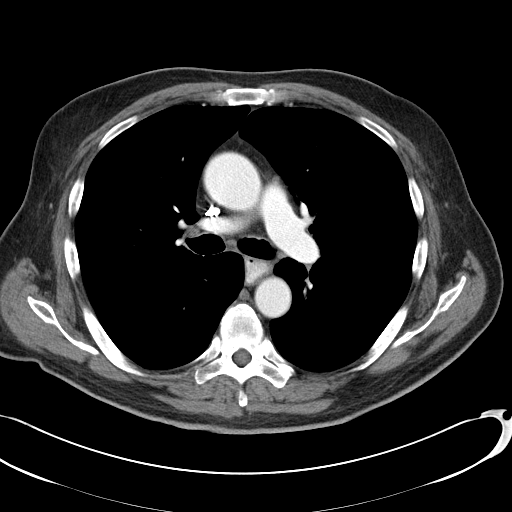
[im 72/90  soft-tissue]
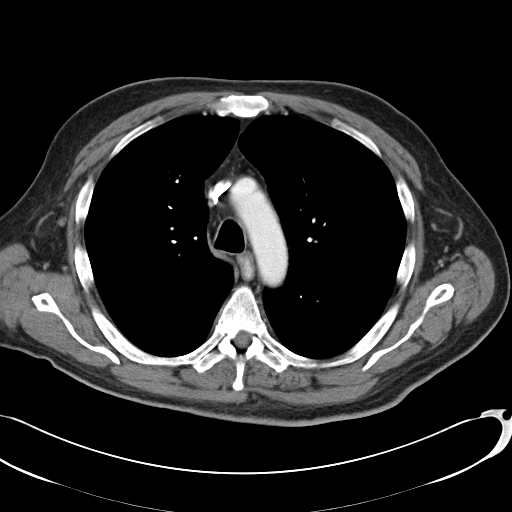
[im 78/90  soft-tissue]
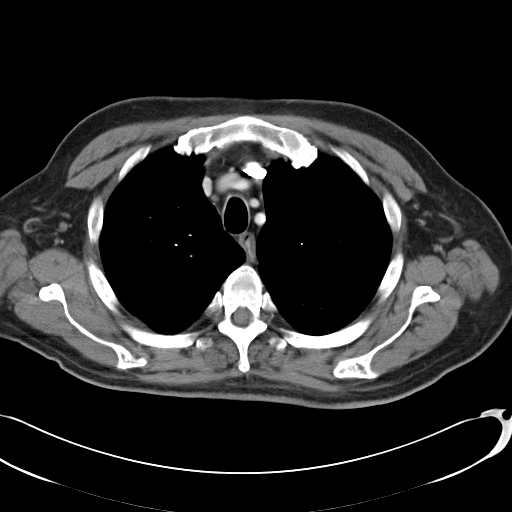
[im 84/90  soft-tissue]
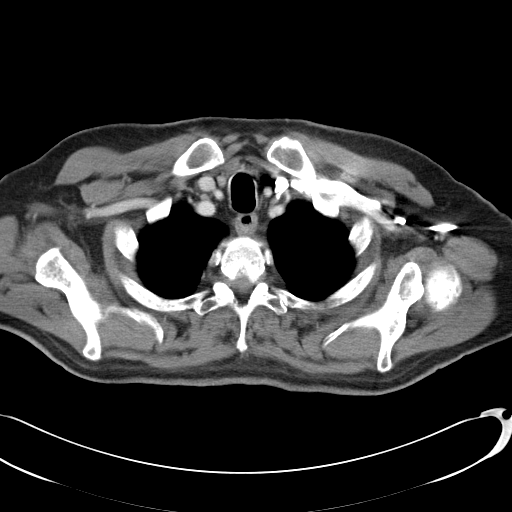

[Series 602: <mpr thick range> · coronal · 0.88mm/px · 3 of 90 slices shown]
[im 30/90  soft-tissue]
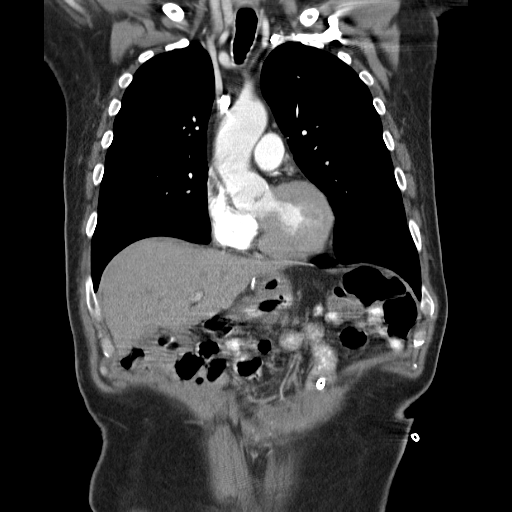
[im 40/90  soft-tissue]
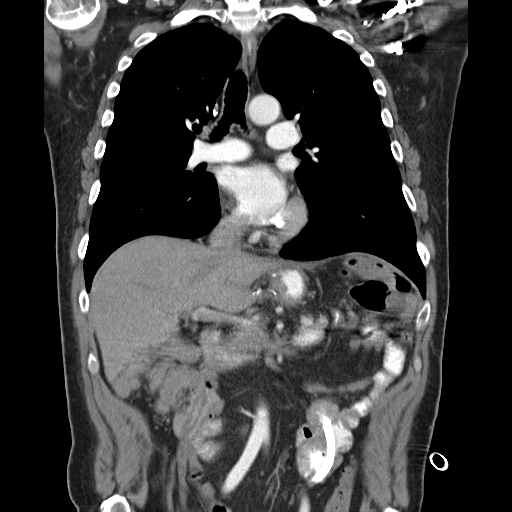
[im 50/90  soft-tissue]
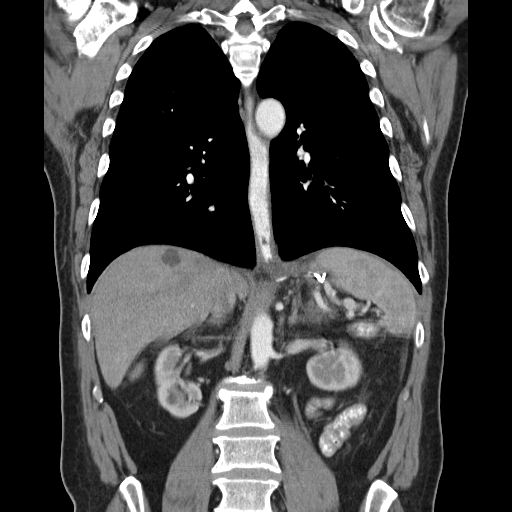

[16 of 46 positions shown; findings below may reference images not displayed]

FINDINGS: CT CHEST FINDINGS

Mediastinum/Nodes: No supraclavicular adenopathy. Bilateral mild
gynecomastia. Normal heart size, without pericardial effusion.
Multivessel coronary artery atherosclerosis. No central pulmonary
embolism, on this non-dedicated study.

No mediastinal or hilar adenopathy.

The esophagus is mildly dilated and contrast filled.

Lungs/Pleura: No pleural fluid. Minimal pleural or subpleural
irregularity in the left lower lobe on image 24/series 4 is
unchanged.

A 1 mm posterior left upper lobe pulmonary nodule on image 27 is
also similar.

No new or enlarging nodules.

Musculoskeletal: No acute osseous abnormality.

CT ABDOMEN FINDINGS

Hepatobiliary: Motion degradation throughout the abdomen on portal
venous phase images. Hepatic dome cyst or complex cyst is slightly
irregular at 1.5 cm, unchanged. Given motion degradation, no new or
enlarging liver lesions are identified. Normal gallbladder, without
biliary ductal dilatation.

Pancreas: Normal, without mass or ductal dilatation.

Spleen: Normal in size, without focal abnormality.

Adrenals/Urinary Tract: Normal adrenal glands. Bilateral fluid
density renal lesions which are likely cysts. No hydronephrosis.

Stomach/Bowel: Partial gastrectomy. Lesser curvature gastric mass
has presumably been resected. Contrast is identified within the
gastric remnant and small bowel loops, consistent with improved to
resolved obstruction at the site. No gross extraluminal air
identified.

Scattered colonic diverticula. A left-sided jejunostomy tube is
seen. Small bowel loops are otherwise unremarkable.

Vascular/Lymphatic: Aortic and branch vessel atherosclerosis. No
retroperitoneal or retrocrural adenopathy. No gastrohepatic or
hepatoduodenal ligament adenopathy.

Other: No ascites.  No evidence of omental or peritoneal disease.

Musculoskeletal: No acute osseous abnormality.
IMPRESSION: 1. Since the prior CT, interval partial gastrectomy and presumed
resection of gastric primary. Mildly dilated, contrast filled
esophagus suggests a component of partial obstruction or
dysmotility. Improvement in high-grade obstruction since the
08/15/2015 esophagram.
2. Motion degraded evaluation of the abdomen. Given this factor, no
convincing evidence of metastatic disease.
3.  Atherosclerosis, including within the coronary arteries.
4. Minimal left-sided pulmonary nodularity is unchanged and favored
to be benign. Recommend attention on follow-up.

## 2016-11-05 ENCOUNTER — Other Ambulatory Visit (HOSPITAL_BASED_OUTPATIENT_CLINIC_OR_DEPARTMENT_OTHER): Payer: Medicare Other

## 2016-11-05 DIAGNOSIS — C165 Malignant neoplasm of lesser curvature of stomach, unspecified: Secondary | ICD-10-CM

## 2016-11-05 LAB — CBC WITH DIFFERENTIAL/PLATELET
BASO%: 0.2 % (ref 0.0–2.0)
Basophils Absolute: 0 10*3/uL (ref 0.0–0.1)
EOS ABS: 0.1 10*3/uL (ref 0.0–0.5)
EOS%: 1.2 % (ref 0.0–7.0)
HEMATOCRIT: 36.3 % — AB (ref 38.4–49.9)
HGB: 12.1 g/dL — ABNORMAL LOW (ref 13.0–17.1)
LYMPH#: 0.8 10*3/uL — AB (ref 0.9–3.3)
LYMPH%: 18.4 % (ref 14.0–49.0)
MCH: 30.4 pg (ref 27.2–33.4)
MCHC: 33.3 g/dL (ref 32.0–36.0)
MCV: 91.2 fL (ref 79.3–98.0)
MONO#: 0.4 10*3/uL (ref 0.1–0.9)
MONO%: 10.1 % (ref 0.0–14.0)
NEUT#: 2.9 10*3/uL (ref 1.5–6.5)
NEUT%: 70.1 % (ref 39.0–75.0)
PLATELETS: 100 10*3/uL — AB (ref 140–400)
RBC: 3.98 10*6/uL — ABNORMAL LOW (ref 4.20–5.82)
RDW: 16 % — ABNORMAL HIGH (ref 11.0–14.6)
WBC: 4.1 10*3/uL (ref 4.0–10.3)

## 2016-11-05 LAB — FERRITIN: Ferritin: 26 ng/ml (ref 22–316)

## 2016-11-07 ENCOUNTER — Other Ambulatory Visit: Payer: Self-pay | Admitting: Nurse Practitioner

## 2016-11-07 ENCOUNTER — Telehealth: Payer: Self-pay | Admitting: Medical Oncology

## 2016-11-07 DIAGNOSIS — C165 Malignant neoplasm of lesser curvature of stomach, unspecified: Secondary | ICD-10-CM

## 2016-11-07 NOTE — Telephone Encounter (Signed)
Pt notified . He is taking iron tablet once a day. Should he double up on his iron? Per Lattie Haw T.  I told pt  he can take iron tablet bid.

## 2016-11-07 NOTE — Telephone Encounter (Signed)
-----   Message from Owens Shark, NP sent at 11/07/2016 11:53 AM EST ----- Please let him know ferritin is in low normal range and confirm if he is taking iron.

## 2016-11-25 DIAGNOSIS — M7542 Impingement syndrome of left shoulder: Secondary | ICD-10-CM | POA: Diagnosis not present

## 2016-11-27 ENCOUNTER — Other Ambulatory Visit (HOSPITAL_BASED_OUTPATIENT_CLINIC_OR_DEPARTMENT_OTHER): Payer: Medicare Other

## 2016-11-27 ENCOUNTER — Ambulatory Visit (HOSPITAL_BASED_OUTPATIENT_CLINIC_OR_DEPARTMENT_OTHER): Payer: Medicare Other | Admitting: Oncology

## 2016-11-27 VITALS — BP 121/76 | HR 60 | Temp 98.7°F | Resp 18 | Wt 150.4 lb

## 2016-11-27 DIAGNOSIS — D701 Agranulocytosis secondary to cancer chemotherapy: Secondary | ICD-10-CM

## 2016-11-27 DIAGNOSIS — D509 Iron deficiency anemia, unspecified: Secondary | ICD-10-CM | POA: Diagnosis not present

## 2016-11-27 DIAGNOSIS — E538 Deficiency of other specified B group vitamins: Secondary | ICD-10-CM | POA: Diagnosis not present

## 2016-11-27 DIAGNOSIS — C160 Malignant neoplasm of cardia: Secondary | ICD-10-CM

## 2016-11-27 DIAGNOSIS — C165 Malignant neoplasm of lesser curvature of stomach, unspecified: Secondary | ICD-10-CM

## 2016-11-27 DIAGNOSIS — D500 Iron deficiency anemia secondary to blood loss (chronic): Secondary | ICD-10-CM

## 2016-11-27 LAB — CBC WITH DIFFERENTIAL/PLATELET
BASO%: 0.6 % (ref 0.0–2.0)
Basophils Absolute: 0 10*3/uL (ref 0.0–0.1)
EOS%: 3.7 % (ref 0.0–7.0)
Eosinophils Absolute: 0.1 10*3/uL (ref 0.0–0.5)
HCT: 34.6 % — ABNORMAL LOW (ref 38.4–49.9)
HEMOGLOBIN: 11.8 g/dL — AB (ref 13.0–17.1)
LYMPH#: 0.9 10*3/uL (ref 0.9–3.3)
LYMPH%: 26.1 % (ref 14.0–49.0)
MCH: 30.9 pg (ref 27.2–33.4)
MCHC: 34.1 g/dL (ref 32.0–36.0)
MCV: 90.6 fL (ref 79.3–98.0)
MONO#: 0.4 10*3/uL (ref 0.1–0.9)
MONO%: 10.9 % (ref 0.0–14.0)
NEUT%: 58.7 % (ref 39.0–75.0)
NEUTROS ABS: 2 10*3/uL (ref 1.5–6.5)
NRBC: 0 % (ref 0–0)
Platelets: 80 10*3/uL — ABNORMAL LOW (ref 140–400)
RBC: 3.82 10*6/uL — ABNORMAL LOW (ref 4.20–5.82)
RDW: 16.8 % — AB (ref 11.0–14.6)
WBC: 3.5 10*3/uL — AB (ref 4.0–10.3)

## 2016-11-27 LAB — FERRITIN: Ferritin: 29 ng/ml (ref 22–316)

## 2016-11-27 NOTE — Progress Notes (Signed)
[name]   Diagnosis: Gastric cancer, iron deficiency anemia  INTERVAL HISTORY:   James [name] returns as scheduled. He reports a good energy level. He is taking iron twice daily. He is playing tennis and golf. He has started working with an Editor, commissioning. No significant dysphagia. He underwent an upper endoscopy 10/10/2016 to Dr. Carlean Purl. An esophageal stricture was dilated. Multiple bleeding "angiogenic ectasias "were found in the stomach. Coagulation was performed with an argon laser.  Objective:  Vital signs in last 24 hours:  Blood pressure 121/76, pulse 60, temperature 98.7 F (37.1 C), temperature source Oral, resp. rate 18, weight 150 lb 7 oz (68.2 kg), SpO2 100 %.    HEENT: Neck without mass Lymphatics: No cervical, supraclavicular, or axillary nodes Resp: Lungs clear bilaterally Cardio: Regular rate and rhythm GI: No hepatosplenomegaly, no mass, tender at the upper aspect of the midline scar Vascular: No leg edema   Lab Results:  Lab Results  Component Value Date   WBC 3.5 (L) 11/27/2016   HGB 11.8 (L) 11/27/2016   HCT 34.6 (L) 11/27/2016   MCV 90.6 11/27/2016   PLT 80 (L) 11/27/2016   NEUTROABS 2.0 11/27/2016   11/05/2016: Ferritin-26 Medications: I have reviewed the patient's current medications.  Assessment/Plan: 1. Gastric cancer-adenocarcinoma of the gastric fundus/cardia ? Staging CT scans 06/07/2015 with no evidence of distant metastatic disease, tiny nonspecific pulmonary nodules and liver lesions-likely benign ? Subtotal gastrectomy and placement of a jejunostomy feeding tube 07/18/2015 for a T2,N0 tumor with negative surgical margins ? Initiation of adjuvant 5-FU/leucovorin 08/28/2015, status post 3 weekly treatments completed 09/13/2015 ? Initiation of radiation and concurrent Xeloda 10/29/2015 ? 11/26/2015 Xeloda dose reduced to 1000 mg twice daily due to progressive thrombocytopenia ? Radiation completed  12/06/2015 ? 5-FU/leucovorin resumed 12/21/2015   2. 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  Upper endoscopy 10/02/2016 with findings of multiple small bleeding angioectasias status post argon plasma coagulation on 10/10/2016  3. Vitamin B-12 deficiency-Low-level confirmed 01/04/2016, maintained on oral vitamin B-12 replacement  4. 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 Carlean Purl 11/01/2015 and 12/10/2015; EGD 10/02/2016 with finding of esophageal stenosis at the gastroesophageal junction status post dilation 10/10/2016  7. Mild neutropenia/thrombocytopenia secondary to chemotherapy. Progressive thrombocytopenia 11/26/2015. Xeloda dose reduced to 1000 mg twice daily. Counts stable to improved 01/04/2016.  Persistent pancytopenia-secondary to chemotherapy versus vitamin B 12 deficiency  8.Erythema/tenderness at the jejunostomy feeding tube site 09/20/2015, improved  9. Feeding tube removed 01/04/2016  10. Melanoma left lower abdominal wall status post wide excision.    Disposition:   James [name] remains in clinical remission from gastric cancer. The hemoglobin has stabilized following in the laser coagulation procedure 10/10/2016. He continues iron. We will follow-up on the ferritin level from today. He will return for a lab visit in 2 months and an office visit in 4 months.  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 He will contact us for bleeding.  He requested a referral to a  specific nutritionist at [facility]. We made a referral today.  25 minutes were spent with the patient today. The majority of the time was used for counseling  and coordination of care.  Betsy Coder, MD  11/27/2016  11:38 AM

## 2016-12-06 DIAGNOSIS — M7542 Impingement syndrome of left shoulder: Secondary | ICD-10-CM | POA: Diagnosis not present

## 2016-12-12 DIAGNOSIS — M7542 Impingement syndrome of left shoulder: Secondary | ICD-10-CM | POA: Diagnosis not present

## 2016-12-12 DIAGNOSIS — M19012 Primary osteoarthritis, left shoulder: Secondary | ICD-10-CM | POA: Diagnosis not present

## 2016-12-31 DIAGNOSIS — M19012 Primary osteoarthritis, left shoulder: Secondary | ICD-10-CM | POA: Diagnosis not present

## 2017-01-01 ENCOUNTER — Other Ambulatory Visit: Payer: Self-pay | Admitting: Orthopedic Surgery

## 2017-01-01 DIAGNOSIS — M19012 Primary osteoarthritis, left shoulder: Secondary | ICD-10-CM

## 2017-01-06 ENCOUNTER — Ambulatory Visit
Admission: RE | Admit: 2017-01-06 | Discharge: 2017-01-06 | Disposition: A | Discharge status: Home / Self Care | Payer: Medicare Other | Source: Ambulatory Visit | Attending: Orthopedic Surgery | Admitting: Orthopedic Surgery

## 2017-01-06 DIAGNOSIS — M19012 Primary osteoarthritis, left shoulder: Secondary | ICD-10-CM

## 2017-01-07 DIAGNOSIS — L853 Xerosis cutis: Secondary | ICD-10-CM | POA: Diagnosis not present

## 2017-01-07 DIAGNOSIS — Z85828 Personal history of other malignant neoplasm of skin: Secondary | ICD-10-CM | POA: Diagnosis not present

## 2017-01-07 DIAGNOSIS — D692 Other nonthrombocytopenic purpura: Secondary | ICD-10-CM | POA: Diagnosis not present

## 2017-01-07 DIAGNOSIS — D1801 Hemangioma of skin and subcutaneous tissue: Secondary | ICD-10-CM | POA: Diagnosis not present

## 2017-01-07 DIAGNOSIS — L821 Other seborrheic keratosis: Secondary | ICD-10-CM | POA: Diagnosis not present

## 2017-01-07 DIAGNOSIS — L570 Actinic keratosis: Secondary | ICD-10-CM | POA: Diagnosis not present

## 2017-01-07 DIAGNOSIS — D2272 Melanocytic nevi of left lower limb, including hip: Secondary | ICD-10-CM | POA: Diagnosis not present

## 2017-01-07 DIAGNOSIS — Z85820 Personal history of malignant melanoma of skin: Secondary | ICD-10-CM | POA: Diagnosis not present

## 2017-01-26 ENCOUNTER — Other Ambulatory Visit: Payer: Medicare Other

## 2017-01-26 ENCOUNTER — Other Ambulatory Visit (HOSPITAL_BASED_OUTPATIENT_CLINIC_OR_DEPARTMENT_OTHER): Payer: Medicare Other

## 2017-01-26 DIAGNOSIS — D500 Iron deficiency anemia secondary to blood loss (chronic): Secondary | ICD-10-CM

## 2017-01-26 DIAGNOSIS — C160 Malignant neoplasm of cardia: Secondary | ICD-10-CM

## 2017-01-26 LAB — CBC WITH DIFFERENTIAL/PLATELET
BASO%: 1 % (ref 0.0–2.0)
Basophils Absolute: 0 10*3/uL (ref 0.0–0.1)
EOS ABS: 0.1 10*3/uL (ref 0.0–0.5)
EOS%: 4.6 % (ref 0.0–7.0)
HEMATOCRIT: 37.4 % — AB (ref 38.4–49.9)
HGB: 12.6 g/dL — ABNORMAL LOW (ref 13.0–17.1)
LYMPH#: 0.6 10*3/uL — AB (ref 0.9–3.3)
LYMPH%: 20.8 % (ref 14.0–49.0)
MCH: 32.4 pg (ref 27.2–33.4)
MCHC: 33.7 g/dL (ref 32.0–36.0)
MCV: 96.2 fL (ref 79.3–98.0)
MONO#: 0.4 10*3/uL (ref 0.1–0.9)
MONO%: 13.3 % (ref 0.0–14.0)
NEUT%: 60.3 % (ref 39.0–75.0)
NEUTROS ABS: 1.8 10*3/uL (ref 1.5–6.5)
PLATELETS: 122 10*3/uL — AB (ref 140–400)
RBC: 3.89 10*6/uL — ABNORMAL LOW (ref 4.20–5.82)
RDW: 16.4 % — ABNORMAL HIGH (ref 11.0–14.6)
WBC: 3 10*3/uL — ABNORMAL LOW (ref 4.0–10.3)

## 2017-01-27 LAB — FERRITIN: FERRITIN: 40 ng/mL (ref 22–316)

## 2017-03-11 ENCOUNTER — Telehealth: Payer: Self-pay | Admitting: *Deleted

## 2017-03-11 DIAGNOSIS — E7849 Other hyperlipidemia: Secondary | ICD-10-CM

## 2017-03-11 MED ORDER — PRAVASTATIN SODIUM 40 MG PO TABS: 40.0000 mg | ORAL_TABLET | Freq: Every day | ORAL | 0 refills | Status: DC

## 2017-03-11 NOTE — Telephone Encounter (Signed)
Pt scheduled an appointment on 03/24/2017 with Dr Quay Burow. He said that he is out of this medication and would like a refill to get him through until he is in to see Dr Quay Burow if possible.

## 2017-03-11 NOTE — Addendum Note (Signed)
Addended by: Earnstine Regal on: 03/11/2017 02:43 PM   Modules accepted: Orders

## 2017-03-11 NOTE — Telephone Encounter (Signed)
Pt left msg on triage requesting refill on his Pravastatin. Called pt back no naswer LMOM w/need OV for refills. Pt haven't seen MD since 02/2016, also need labs...James [name]

## 2017-03-11 NOTE — Telephone Encounter (Signed)
Sent 30 day script to pof...Johny Chess

## 2017-03-24 ENCOUNTER — Ambulatory Visit (INDEPENDENT_AMBULATORY_CARE_PROVIDER_SITE_OTHER): Payer: Medicare Other | Admitting: Internal Medicine

## 2017-03-24 ENCOUNTER — Encounter: Payer: Self-pay | Admitting: Internal Medicine

## 2017-03-24 VITALS — BP 112/70 | HR 63 | Temp 98.5°F | Resp 16 | Ht 67.0 in | Wt 149.0 lb

## 2017-03-24 DIAGNOSIS — D696 Thrombocytopenia, unspecified: Secondary | ICD-10-CM

## 2017-03-24 DIAGNOSIS — Z1211 Encounter for screening for malignant neoplasm of colon: Secondary | ICD-10-CM | POA: Diagnosis not present

## 2017-03-24 DIAGNOSIS — E782 Mixed hyperlipidemia: Secondary | ICD-10-CM | POA: Diagnosis not present

## 2017-03-24 DIAGNOSIS — Z0000 Encounter for general adult medical examination without abnormal findings: Secondary | ICD-10-CM | POA: Diagnosis not present

## 2017-03-24 DIAGNOSIS — K222 Esophageal obstruction: Secondary | ICD-10-CM

## 2017-03-24 MED ORDER — PRAVASTATIN SODIUM 40 MG PO TABS: 40.0000 mg | ORAL_TABLET | Freq: Every day | ORAL | 3 refills | Status: DC

## 2017-03-24 MED ORDER — OMEPRAZOLE 40 MG PO CPDR: DELAYED_RELEASE_CAPSULE | ORAL | 3 refills | Status: DC

## 2017-03-24 NOTE — Patient Instructions (Addendum)
  [name] , Thank you for taking time to come for your Medicare Wellness Visit. I appreciate your ongoing commitment to your health goals. Please review the following plan we discussed and let me know if I can assist you in the future.   These are the goals we discussed: Goals    None      This is a list of the screening recommended for you and due dates:  Health Maintenance  Topic Date Due  . Colon Cancer Screening  02/22/2017  . Flu Shot  05/06/2017  . Tetanus Vaccine  12/22/2019  .  Hepatitis C: One time screening is recommended by Center for Disease Control  (CDC) for  adults born from [birth year] through 1966.   Completed  . Pneumonia vaccines  Completed     Test(s) ordered today. Your results will be released to [place] (or called to you) after review, usually within 72hours after test completion. If any changes need to be made, you will be notified at that same time.  All other Health Maintenance issues reviewed.   All recommended immunizations and age-appropriate screenings are up-to-date or discussed.  No immunizations administered today.   Medications reviewed and updated.    No changes recommended at this time.  Your prescription(s) have been submitted to your pharmacy. Please take as directed and contact our office if you believe you are having problem(s) with the medication(s).    Please followup in one year, sooner if needed

## 2017-03-24 NOTE — Progress Notes (Signed)
Subjective:    Patient ID: James [name], male    DOB: [birth year], 71 y.o.   MRN: [id number]  HPI Here for a subsequent medicare wellness exam and follow up of his chronic medical problems  I have personally reviewed and have noted 1.The patient's medical and social history 2.Their use of alcohol, tobacco or illicit drugs 3.Their current medications and supplements 4.The patient's functional ability including ADL's, fall risks, home safety risks and hearing or visual impairment. 5.Diet and physical activities 6.Evidence for depression or mood disorders 7.Care team reviewed and updated (available in snapshot)   H/o gastric cancer:  He is following with oncology and in remission.    Iron def anemia/ UGIB:  He had an EGD the end of last year and was found to have bleeding angioectasias.  He is taking iron twice daily.  His Hgb has been stable.   Mild thrombocytopenia/neutropenia:  Secondary to chemotherapy.  He is following with oncology.    Hyperlipidemia: He is taking his medication daily. He is compliant with a low fat/cholesterol diet. He is exercising regularly. He denies myalgias.   Esophageal stricture, bleeding angioectasias, h/o gastric cancer:  He is taking his medication daily as prescribed.  He denies any GERD symptoms and feels his GERD is well controlled.  He denies difficulty swallowing.    Are there smokers in your home (other than you)? No  Risk Factors Exercise: tennis, golf, weights Dietary issues discussed: eats 6 meals a day, well balanced  Cardiac risk factors: advanced age, hyperlipidemia  Depression Screen  Have you felt down, depressed or hopeless? No  Have you felt little interest or pleasure in doing things?  No  Activities of Daily Living In your present state of health, do you have any difficulty performing the following activities?:  Driving? No Managing money?  No Feeding  yourself? No Getting from bed to chair? No Climbing a flight of stairs? No Preparing food and eating?: No Bathing or showering? No Getting dressed: No Getting to/using the toilet? No Moving around from place to place: No In the past year have you fallen or had a near fall?: No   Are you sexually active?  No  Do you have more than one partner?  N/A  Hearing Difficulties: yes Do you often ask people to speak up or repeat themselves? yes Do you experience ringing or noises in your ears? no Do you have difficulty understanding soft or whispered voices? yes Vision:              Any change in vision:  No              Up to date with eye exam:   yes Memory:  Do you feel that you have a problem with memory? No  Do you often misplace items? No  Do you feel safe at home?  Yes  Cognitive Testing  Alert, Orientated? Yes  Normal Appearance? Yes  Recall of three objects?  Yes  Can perform simple calculations? Yes  Displays appropriate judgment? Yes  Can read the correct time from a watch face? Yes   Advanced Directives have been discussed with the patient? Yes     Medications and allergies reviewed with patient and updated if appropriate.  Patient Active Problem List   Diagnosis Date Noted  . Gastric AVM   . Thrombocytopenia ([place]) 05/24/2016  . Malignant melanoma ([place]) 01/08/2016  . Acute esophagitis   . Dysphagia   . Anastomotic stricture of stomach   .  Esophageal stricture   . 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 Gastric cancer ([place]) 06/08/2015  . B12 deficiency 06/05/2015  . Prostate cancer ([place]) 10/24/2014  . Unspecified vitamin D deficiency 06/03/2014  . History of duodenal ulcer 12/16/2013  . History of transfusion 12/16/2013  . Basal cell cancer 09/17/2012  . HEARING DEFICIT 12/21/2009  . NONSPECIFIC ABNORMAL ELECTROCARDIOGRAM 12/21/2009  . FASTING HYPERGLYCEMIA 12/04/2008  . History of colonic polyps  12/04/2008  . HYPERLIPIDEMIA 08/23/2007    Current Outpatient Prescriptions on File Prior to Visit  Medication Sig Dispense Refill  . acetaminophen (TYLENOL) 500 MG tablet Take 500 mg by mouth every 6 (six) hours as needed (For pain.).    [name] cyanocobalamin 1000 MCG tablet Take 1,000 mcg by mouth daily.    . ferrous sulfate 325 (65 FE) MG tablet Take 1 tablet (325 mg total) by mouth daily with breakfast. (Patient taking differently: Take 325 mg by mouth 2 (two) times daily with a meal. )  3  . Multiple Vitamin (MULTIVITAMIN WITH MINERALS) TABS tablet Take 1 tablet by mouth daily.    [name] omeprazole (PRILOSEC) 40 MG capsule take 1 capsule by mouth once daily 30 MINUTES BEFORE BREAKFAST/SUPPER.  OKAY TO OPEN AND SPRINKLE ON APPLESAUCE 60 capsule 2  . pravastatin (PRAVACHOL) 40 MG tablet Take 1 tablet (40 mg total) by mouth at bedtime. Must keep June appt for future refills 30 tablet 0   No current facility-administered medications on file prior to visit.     Past Medical History:  Diagnosis Date  . Abnormal EKG    NS ST-T EKG Changes, (-) Nuclear Stress Test 03/2006  . Allergy   . Anemia    iron deficency  . B12 deficiency 06/05/2015   B12 Low NL at 271 methtylmalonic acid high  . Cardiac murmur    nothing to be concerned with   . Cataract    extractions  . Duodenal ulcer 1972   transfusion required, 1 unit packed cells  . Dysphagia    history throat/stomach  cancer "can swallow some, uses more pureed type foods"  . Esophageal reflux    no currlently  . Gastric cancer ([place]) 07/18/15   invasive adenocarcinoma  . Gastrostomy tube in place [facility])    intermittent feeding  . Hx of basal cell carcinoma   . Hyperlipemia    no per pt 04-11-16  . Melanoma ([place])   . Prostate cancer ([place]) 09/06/14   last radiation 4'16   . S/P radiation therapy 11/29/2014 through 01/23/2015    Prostate 7800 cGy in 40 sessions, seminal vesicles 5600 cGy in 40  sessions   . Skin cancer, basal cell   . Transfusion history    '72 -s/p surgery for duodenal ulcer, 12/17    Past Surgical History:  Procedure Laterality Date  . BALLOON DILATION N/A 09/12/2015   Procedure: BALLOON DILATION;  Surgeon: Gatha Mayer, MD;  Location: [facility];  Service: Endoscopy;  Laterality: N/A;  . BALLOON DILATION N/A 09/21/2015   Procedure: BALLOON DILATION;  Surgeon: Gatha Mayer, MD;  [facility];  Service: Endoscopy;  Laterality: N/A;  . BALLOON DILATION N/A 10/04/2015   Procedure: BALLOON DILATION;  Surgeon: Milus Banister, MD;  [facility];  Service: Endoscopy;  Laterality: N/A;  . BALLOON DILATION N/A 12/10/2015   Procedure: BALLOON DILATION;  Surgeon: Gatha Mayer, MD;  [facility];  Service: Endoscopy;  Laterality: N/A;  .  BALLOON DILATION N/A 10/10/2016   Procedure: BALLOON DILATION;  Surgeon: Gatha Mayer, MD;  [facility];  Service: Endoscopy;  Laterality: N/A;  . CATARACT EXTRACTION, BILATERAL Bilateral   . COLONOSCOPY    . colonoscopy with polypectomy  02/2012   Dr Sharlett Iles  . ESOPHAGOGASTRODUODENOSCOPY (EGD) WITH PROPOFOL N/A 08/17/2015   Procedure: ESOPHAGOGASTRODUODENOSCOPY (EGD) WITH PROPOFOL;  Surgeon: Gatha Mayer, MD;  Location: [place];  Service: Endoscopy;  Laterality: N/A;  . ESOPHAGOGASTRODUODENOSCOPY (EGD) WITH PROPOFOL N/A 09/12/2015   Procedure: ESOPHAGOGASTRODUODENOSCOPY (EGD) WITH PROPOFOL;  Surgeon: Gatha Mayer, MD;  [facility];  Service: Endoscopy;  Laterality: N/A;  . ESOPHAGOGASTRODUODENOSCOPY (EGD) WITH PROPOFOL N/A 09/21/2015   Procedure: ESOPHAGOGASTRODUODENOSCOPY (EGD) WITH PROPOFOL;  Surgeon: Gatha Mayer, MD;  [facility];  Service: Endoscopy;  Laterality: N/A;  . ESOPHAGOGASTRODUODENOSCOPY (EGD) WITH PROPOFOL N/A 10/04/2015   Procedure: ESOPHAGOGASTRODUODENOSCOPY (EGD) WITH PROPOFOL;  Surgeon: Milus Banister, MD;  [facility];  Service: Endoscopy;  Laterality: N/A;  possible stricture  . ESOPHAGOGASTRODUODENOSCOPY (EGD) WITH PROPOFOL N/A 12/10/2015   Procedure: ESOPHAGOGASTRODUODENOSCOPY (EGD) WITH PROPOFOL;  Surgeon: Gatha Mayer, MD;  [facility];  Service: Endoscopy;  Laterality: N/A;  . ESOPHAGOGASTRODUODENOSCOPY (EGD) WITH PROPOFOL N/A 02/06/2016   Procedure: ESOPHAGOGASTRODUODENOSCOPY (EGD) WITH PROPOFOL;  Surgeon: Gatha Mayer, MD;  [facility];  Service: Endoscopy;  Laterality: N/A;  . ESOPHAGOGASTRODUODENOSCOPY (EGD) WITH PROPOFOL N/A 10/10/2016   Procedure: ESOPHAGOGASTRODUODENOSCOPY (EGD) WITH PROPOFOL;  Surgeon: Gatha Mayer, MD;  [facility];  Service: Endoscopy;  Laterality: N/A;  needs APC   . EUS N/A 06/14/2015   Procedure: ESOPHAGEAL ENDOSCOPIC ULTRASOUND (EUS) RADIAL;  Surgeon: Milus Banister, MD;  [facility];  Service: Endoscopy;  Laterality: N/A;  . HOT HEMOSTASIS N/A 10/10/2016   Procedure: HOT HEMOSTASIS (ARGON PLASMA COAGULATION/BICAP);  Surgeon: Gatha Mayer, MD;  [facility];  Service: Endoscopy;  Laterality: N/A;  . KNEE ARTHROSCOPY Right    Right Knee, [place] [facility]  . LAPAROSCOPIC GASTRECTOMY N/A 07/18/2015   Procedure: SUB TOTAL GASTRECTOMY;  Surgeon: Stark Klein, MD;  [facility];  Service: General;  Laterality: N/A;  . LAPAROSCOPIC GASTROSTOMY N/A 07/18/2015   Procedure: FEEDING TUBE PLACEMENT;  Surgeon: Stark Klein, MD;  [facility];  Service: General;  Laterality: N/A;  . LAPAROSCOPY N/A 07/18/2015   Procedure: LAPAROSCOPY DIAGNOSTIC;  Surgeon: Stark Klein, MD;  [facility];  Service: General;  Laterality: N/A;  . MELANOMA EXCISION  02-01-16   abdominal wall  . PROSTATE BIOPSY  09/06/14  . ROTATOR CUFF REPAIR Right    R shoulder, [place]  . SAVORY DILATION N/A 02/06/2016   Procedure: SAVORY DILATION;  Surgeon: Gatha Mayer, MD;  [facility];  Service: Endoscopy;  Laterality: N/A;  .  TRIGGER FINGER RELEASE Left 1998  . UPPER GASTROINTESTINAL ENDOSCOPY     x14  . VASECTOMY      Social History   Social History  . Marital status: Married    Spouse name: N/A  . Number of children: 2  . Years of education: N/A   Occupational History  . retired    Social History Main Topics  . Smoking status: Former Smoker    Packs/day: 0.50    Years: 5.00    Types: Cigarettes    Quit date: 10/07/1967  . Smokeless tobacco: Never Used     Comment: smoked [place] , up to < 1 ppd  . Alcohol use No  .  Drug use: No  . Sexual activity: Not on file   Other Topics Concern  . Not on file   Social History Narrative   Married and retired - 1 son and 1 daughter    Lives at [place] and active golfer, tennis player   05/30/2015       Family History  Problem Relation Age of Onset  . Diabetes Father   . Heart attack Father 6  . Liver disease Father        Related to Alcohol Use   . Alcohol abuse Mother   . Cirrhosis Mother        died in her 37s  . Multiple myeloma Brother   . Diabetes Sister   . Colon cancer Neg Hx   . Stomach cancer Neg Hx   . Stroke Neg Hx   . Esophageal cancer Neg Hx   . Rectal cancer Neg Hx     Review of Systems  Constitutional: Negative for chills and fever.  HENT: Positive for congestion (yellow mucus) and hearing loss. Negative for ear pain, sinus pain, sinus pressure, sore throat, tinnitus and trouble swallowing.   Eyes: Negative for visual disturbance.  Respiratory: Positive for cough (occ productive). Negative for shortness of breath and wheezing.   Cardiovascular: Negative for chest pain, palpitations and leg swelling.  Gastrointestinal: Negative for abdominal pain, blood in stool, constipation, diarrhea and nausea.       No GERD  Genitourinary: Negative for dysuria and hematuria.  Musculoskeletal: Positive for arthralgias (left shoulder pain).  Skin: Negative for color change and rash.  Neurological: Positive for dizziness (occ).  Negative for light-headedness and headaches.  Psychiatric/Behavioral: Negative for dysphoric mood. The patient is not nervous/anxious.        Objective:   Vitals:   03/24/17 1414  BP: 112/70  Pulse: 63  Resp: 16  Temp: 98.5 F (36.9 C)   Wt Readings from Last 3 Encounters:  03/24/17 149 lb (67.6 kg)  11/27/16 150 lb 7 oz (68.2 kg)  10/15/16 153 lb 6.4 oz (69.6 kg)   Body mass index is 23.34 kg/m.   Physical Exam    Constitutional: Appears well-developed and well-nourished. No distress.  HENT:  Head: Normocephalic and atraumatic.  Neck: Neck supple. No tracheal deviation present. No thyromegaly present.  No cervical lymphadenopathy, b/l ear canals and TM normal.  No oropharynx erythema Cardiovascular: Normal rate, regular rhythm and normal heart sounds.   No murmur heard. No carotid bruit .  No edema Pulmonary/Chest: Effort normal and breath sounds normal. No respiratory distress. No has no wheezes. No rales.  Abdomen: soft, non tender, non distended Skin: Skin is warm and dry. Not diaphoretic.  Psychiatric: Normal mood and affect. Behavior is normal.      Assessment & Plan:   Wellness Exam: Immunizations   Discussed shingrix, others up to date Colonoscopy  Last done 2013 - due this year Eye exam  Up to date  Hearing loss   Yes  Memory concerns/difficulties  none Independent of ADLs  fully Stressed the importance of regular exercise, which he does   Patient received copy of preventative screening tests/immunizations recommended for the next 5-10 years.   See Problem List for Assessment and Plan of chronic medical problems.

## 2017-03-24 NOTE — Assessment & Plan Note (Signed)
Secondary to chemo and radiation Monitored by oncology

## 2017-03-24 NOTE — Assessment & Plan Note (Signed)
Check lipid panel  Continue daily statin Regular exercise and healthy diet encouraged  

## 2017-03-24 NOTE — Assessment & Plan Note (Signed)
No gerd symptoms No difficulty swallowing Continue omeprazole 40 mg BID

## 2017-03-26 ENCOUNTER — Ambulatory Visit
Admission: RE | Admit: 2017-03-26 | Discharge: 2017-03-26 | Disposition: A | Discharge status: Home / Self Care | Payer: Medicare Other | Source: Ambulatory Visit | Attending: Orthopedic Surgery | Admitting: Orthopedic Surgery

## 2017-03-26 ENCOUNTER — Other Ambulatory Visit: Payer: Self-pay | Admitting: Orthopedic Surgery

## 2017-03-26 DIAGNOSIS — M25512 Pain in left shoulder: Secondary | ICD-10-CM

## 2017-03-27 ENCOUNTER — Telehealth: Payer: Self-pay | Admitting: Oncology

## 2017-03-27 ENCOUNTER — Other Ambulatory Visit (HOSPITAL_BASED_OUTPATIENT_CLINIC_OR_DEPARTMENT_OTHER): Payer: Medicare Other

## 2017-03-27 ENCOUNTER — Other Ambulatory Visit: Payer: Self-pay | Admitting: Internal Medicine

## 2017-03-27 ENCOUNTER — Ambulatory Visit (HOSPITAL_BASED_OUTPATIENT_CLINIC_OR_DEPARTMENT_OTHER): Payer: Medicare Other | Admitting: Oncology

## 2017-03-27 VITALS — BP 108/77 | HR 56 | Temp 98.5°F | Resp 18 | Ht 67.0 in | Wt 150.4 lb

## 2017-03-27 DIAGNOSIS — D696 Thrombocytopenia, unspecified: Secondary | ICD-10-CM

## 2017-03-27 DIAGNOSIS — C160 Malignant neoplasm of cardia: Secondary | ICD-10-CM

## 2017-03-27 DIAGNOSIS — D500 Iron deficiency anemia secondary to blood loss (chronic): Secondary | ICD-10-CM

## 2017-03-27 DIAGNOSIS — E782 Mixed hyperlipidemia: Secondary | ICD-10-CM | POA: Diagnosis not present

## 2017-03-27 LAB — CBC WITH DIFFERENTIAL/PLATELET
BASO%: 0.4 % (ref 0.0–2.0)
Basophils Absolute: 0 10*3/uL (ref 0.0–0.1)
EOS%: 4.2 % (ref 0.0–7.0)
Eosinophils Absolute: 0.1 10*3/uL (ref 0.0–0.5)
HEMATOCRIT: 35.4 % — AB (ref 38.4–49.9)
HGB: 12.2 g/dL — ABNORMAL LOW (ref 13.0–17.1)
LYMPH#: 0.6 10*3/uL — AB (ref 0.9–3.3)
LYMPH%: 22.7 % (ref 14.0–49.0)
MCH: 33.2 pg (ref 27.2–33.4)
MCHC: 34.5 g/dL (ref 32.0–36.0)
MCV: 96.2 fL (ref 79.3–98.0)
MONO#: 0.3 10*3/uL (ref 0.1–0.9)
MONO%: 9.6 % (ref 0.0–14.0)
NEUT#: 1.6 10*3/uL (ref 1.5–6.5)
NEUT%: 63.1 % (ref 39.0–75.0)
PLATELETS: 99 10*3/uL — AB (ref 140–400)
RBC: 3.68 10*6/uL — AB (ref 4.20–5.82)
RDW: 14 % (ref 11.0–14.6)
WBC: 2.6 10*3/uL — AB (ref 4.0–10.3)

## 2017-03-27 LAB — FERRITIN: Ferritin: 68 ng/mL (ref 22–316)

## 2017-03-27 NOTE — Telephone Encounter (Signed)
Scheduled appt per 6/22 los - Gave patient AVS and calender per los.  

## 2017-03-27 NOTE — Progress Notes (Signed)
James [name]   Diagnosis: Gastric cancer  INTERVAL HISTORY:   James [name] returns as scheduled. He feels well. He is taking iron. He is golfing. He does not have dysphagia if he chews his food well. He is scheduled for left shoulder surgery in August. He had a recent cold with chest and sinus congestion.  Objective:  Vital signs in last 24 hours:  Blood pressure 108/77, pulse (!) 56, temperature 98.5 F (36.9 C), temperature source Oral, resp. rate 18, height 5\' 7"  (1.702 m), weight 150 lb 6.4 oz (68.2 kg), SpO2 100 %.    HEENT: Neck without mass Lymphatics: No cervical, supraclavicular, or axillary nodes Resp: Scattered rhonchi inspiratory rhonchi at the left greater than the right lower posterior chest that cleared after several respirations. End inspiratory bronchial sounds at the left upper posterior chest, no respiratory distress. The lungs are clear anteriorly Cardio: Regular rate and rhythm GI: No hepatosplenomegaly, no mass, no ascites, tender at the subxiphoid region in the area of apparent scar tissue Vascular: No leg edema   Lab Results:  Lab Results  Component Value Date   WBC 2.6 (L) 03/27/2017   HGB 12.2 (L) 03/27/2017   HCT 35.4 (L) 03/27/2017   MCV 96.2 03/27/2017   PLT 99 (L) 03/27/2017   NEUTROABS 1.6 03/27/2017    Medications: I have reviewed the patient's current medications.  Assessment/Plan: 1. Gastric cancer-adenocarcinoma of the gastric fundus/cardia ? Staging CT scans 06/07/2015 with no evidence of distant metastatic disease, tiny nonspecific pulmonary nodules and liver lesions-likely benign ? Subtotal gastrectomy and placement of a jejunostomy feeding tube 07/18/2015 for a T2,N0 tumor with negative surgical margins ? Initiation of adjuvant 5-FU/leucovorin 08/28/2015, status post 3 weekly treatments completed 09/13/2015 ? Initiation of radiation and concurrent Xeloda 10/29/2015 ? 11/26/2015 Xeloda dose reduced to  1000 mg twice daily due to progressive thrombocytopenia ? Radiation completed 12/06/2015 ? 5-FU/leucovorin resumed 12/21/2015   2. 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  Upper endoscopy 10/02/2016 with findings of multiple small bleeding angioectasiasstatus post argon plasma coagulation on 10/10/2016  3. Vitamin B-12 deficiency-Low-level confirmed 01/04/2016, maintained on oral vitamin B-12 replacement  4. 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 Anastomotic stricture-status post balloon dilatation 08/22/2015, status post electroincision dilation at [facility] 10/11/2015, repeat EGD/ dilatation by Dr. Carlean Purl 11/01/2015 and 12/10/2015; EGD 10/02/2016 with finding of esophageal stenosis at the gastroesophageal junction status post dilation 10/10/2016  7. Mild neutropenia/thrombocytopenia secondary to chemotherapy. Progressive thrombocytopenia 11/26/2015. Xeloda dose reduced to 1000 mg twice daily. Counts stable to improved 01/04/2016.  Persistent mild thrombocytopenia  8.Erythema/tenderness at the jejunostomy feeding tube site 09/20/2015, improved  9. Feeding tube removed 01/04/2016  10. Melanoma left lower abdominal wall status post wide excision.   Disposition:  James [name] remains in clinical remission from gastric cancer. The hemoglobin is stable. He will continue iron. He will return for an office visit in 4 months. He will contact us in the interim for new symptoms. He has persistent mild thrombocytopenia, possibly related to treatment for the gastric cancer, ITP, or underlying liver disease. The lymphopenia is likely secondary to radiation.  15 minutes were spent with the patient today. The majority of the  time was used for counseling and coordination of care.  James Romberg, MD  03/27/2017  12:13 PM

## 2017-03-31 ENCOUNTER — Encounter: Payer: Self-pay | Admitting: Internal Medicine

## 2017-03-31 LAB — LIPID PANEL W/O CHOL/HDL RATIO
CHOLESTEROL TOTAL: 129 mg/dL (ref 100–199)
HDL: 52 mg/dL (ref >39)
LDL Calculated: 57 mg/dL (ref 0–99)
TRIGLYCERIDES: 101 mg/dL (ref 0–149)
VLDL Cholesterol Cal: 20 mg/dL (ref 5–40)

## 2017-03-31 LAB — COMPREHENSIVE METABOLIC PANEL
ALK PHOS: 83 IU/L (ref 39–117)
ALT: 8 IU/L (ref 0–44)
AST: 23 IU/L (ref 0–40)
Albumin/Globulin Ratio: 2.1 (ref 1.2–2.2)
Albumin: 4.2 g/dL (ref 3.5–4.8)
BUN/Creatinine Ratio: 21 (ref 10–24)
BUN: 15 mg/dL (ref 8–27)
Bilirubin Total: 0.2 mg/dL (ref 0.0–1.2)
CO2: 21 mmol/L (ref 20–29)
CREATININE: 0.73 mg/dL — AB (ref 0.76–1.27)
Calcium: 9 mg/dL (ref 8.6–10.2)
Chloride: 105 mmol/L (ref 96–106)
GFR calc Af Amer: 107 mL/min/{1.73_m2} (ref >59)
GFR calc non Af Amer: 93 mL/min/{1.73_m2} (ref >59)
GLOBULIN, TOTAL: 2 g/dL (ref 1.5–4.5)
Glucose: 119 mg/dL — ABNORMAL HIGH (ref 65–99)
POTASSIUM: 4.2 mmol/L (ref 3.5–5.2)
SODIUM: 139 mmol/L (ref 134–144)
Total Protein: 6.2 g/dL (ref 6.0–8.5)

## 2017-03-31 LAB — BASIC METABOLIC PANEL
BUN: 15 (ref 4–21)
Glucose: 119

## 2017-03-31 LAB — SPECIMEN STATUS REPORT

## 2017-03-31 NOTE — Progress Notes (Unsigned)
Results entered and sent to scan  

## 2017-04-20 DIAGNOSIS — M19012 Primary osteoarthritis, left shoulder: Secondary | ICD-10-CM | POA: Diagnosis not present

## 2017-04-30 NOTE — Pre-Procedure Instructions (Signed)
CEVIN RUBINSTEIN  04/30/2017      [id number], [place] [address] Phone: [phone] Fax: [phone]    Your procedure is scheduled on May 07, 2017.  Report to [facility] Admitting at 530 AM.  Call this number if you have problems the morning of surgery:  [phone]   Remember:  Do not eat food or drink liquids after midnight.  Take these medicines the morning of surgery with A SIP OF WATER acetaminphen (tylenol), omeprazole (prilosec).  7 days prior to surgery STOP taking any Aspirin, Aleve, Naproxen, Ibuprofen, Motrin, Advil, Goody's, BC's, all herbal medications, fish oil, and all vitamins  Do not wear jewelry, make-up or nail polish.  Do not wear lotions, powders, or perfumes, or deoderant.  Men may shave face and neck.  Do not bring valuables to the hospital.  [facility] is not responsible for any belongings or valuables.  Contacts, dentures or bridgework may not be worn into surgery.  Leave your suitcase in the car.  After surgery it may be brought to your room.  For patients admitted to the hospital, discharge time will be determined by your treatment team.  Patients discharged the day of surgery will not be allowed to drive home.    Special instructions:   [place]- Preparing For Surgery  Before surgery, you can play an important role. Because skin is not sterile, your skin needs to be as free of germs as possible. You can reduce the number of germs on your skin by washing with CHG (chlorahexidine gluconate) Soap before surgery.  CHG is an antiseptic cleaner which kills germs and bonds with the skin to continue killing germs even after washing.  Please do not use if you have an allergy to CHG or antibacterial soaps. If your skin becomes reddened/irritated stop using the CHG.  Do not shave (including legs and underarms) for at least 48 hours prior to first CHG  shower. It is OK to shave your face.  Please follow these instructions carefully.   1. Shower the NIGHT BEFORE SURGERY and the MORNING OF SURGERY with CHG.   2. If you chose to wash your hair, wash your hair first as usual with your normal shampoo.  3. After you shampoo, rinse your hair and body thoroughly to remove the shampoo.  4. Use CHG as you would any other liquid soap. You can apply CHG directly to the skin and wash gently with a scrungie or a clean washcloth.   5. Apply the CHG Soap to your body ONLY FROM THE NECK DOWN.  Do not use on open wounds or open sores. Avoid contact with your eyes, ears, mouth and genitals (private parts). Wash genitals (private parts) with your normal soap.  6. Wash thoroughly, paying special attention to the area where your surgery will be performed.  7. Thoroughly rinse your body with warm water from the neck down.  8. DO NOT shower/wash with your normal soap after using and rinsing off the CHG Soap.  9. Pat yourself dry with a CLEAN TOWEL.   10. Wear CLEAN PAJAMAS   11. Place CLEAN SHEETS on your bed the night of your first shower and DO NOT SLEEP WITH PETS.    Day of Surgery: Do not apply any deodorants/lotions. Please wear clean clothes to the hospital/surgery center.     Please read over the following fact sheets that you were given. Pain  Booklet, Coughing and Deep Breathing, MRSA Information and Surgical Site Infection Prevention

## 2017-05-01 ENCOUNTER — Other Ambulatory Visit: Payer: Self-pay

## 2017-05-01 ENCOUNTER — Encounter (HOSPITAL_COMMUNITY): Payer: Self-pay

## 2017-05-01 ENCOUNTER — Encounter (HOSPITAL_COMMUNITY)
Admission: RE | Admit: 2017-05-01 | Discharge: 2017-05-01 | Disposition: A | Discharge status: Home / Self Care | Payer: Medicare Other | Source: Ambulatory Visit | Attending: Orthopedic Surgery | Admitting: Orthopedic Surgery

## 2017-05-01 DIAGNOSIS — R011 Cardiac murmur, unspecified: Secondary | ICD-10-CM | POA: Insufficient documentation

## 2017-05-01 DIAGNOSIS — K219 Gastro-esophageal reflux disease without esophagitis: Secondary | ICD-10-CM | POA: Insufficient documentation

## 2017-05-01 DIAGNOSIS — Z923 Personal history of irradiation: Secondary | ICD-10-CM | POA: Insufficient documentation

## 2017-05-01 DIAGNOSIS — D696 Thrombocytopenia, unspecified: Secondary | ICD-10-CM | POA: Diagnosis not present

## 2017-05-01 DIAGNOSIS — R0600 Dyspnea, unspecified: Secondary | ICD-10-CM | POA: Diagnosis not present

## 2017-05-01 DIAGNOSIS — Z79899 Other long term (current) drug therapy: Secondary | ICD-10-CM | POA: Insufficient documentation

## 2017-05-01 DIAGNOSIS — Z8500 Personal history of malignant neoplasm of unspecified digestive organ: Secondary | ICD-10-CM | POA: Diagnosis not present

## 2017-05-01 DIAGNOSIS — Z85028 Personal history of other malignant neoplasm of stomach: Secondary | ICD-10-CM | POA: Diagnosis not present

## 2017-05-01 DIAGNOSIS — Z885 Allergy status to narcotic agent status: Secondary | ICD-10-CM | POA: Diagnosis not present

## 2017-05-01 DIAGNOSIS — Z01812 Encounter for preprocedural laboratory examination: Secondary | ICD-10-CM | POA: Diagnosis not present

## 2017-05-01 DIAGNOSIS — Z8546 Personal history of malignant neoplasm of prostate: Secondary | ICD-10-CM | POA: Diagnosis not present

## 2017-05-01 DIAGNOSIS — D649 Anemia, unspecified: Secondary | ICD-10-CM | POA: Diagnosis not present

## 2017-05-01 DIAGNOSIS — Z9852 Vasectomy status: Secondary | ICD-10-CM | POA: Diagnosis not present

## 2017-05-01 DIAGNOSIS — R1310 Dysphagia, unspecified: Secondary | ICD-10-CM | POA: Insufficient documentation

## 2017-05-01 DIAGNOSIS — Z903 Acquired absence of stomach [part of]: Secondary | ICD-10-CM | POA: Diagnosis not present

## 2017-05-01 DIAGNOSIS — E785 Hyperlipidemia, unspecified: Secondary | ICD-10-CM | POA: Insufficient documentation

## 2017-05-01 DIAGNOSIS — Z85828 Personal history of other malignant neoplasm of skin: Secondary | ICD-10-CM | POA: Diagnosis not present

## 2017-05-01 DIAGNOSIS — Z87891 Personal history of nicotine dependence: Secondary | ICD-10-CM | POA: Diagnosis not present

## 2017-05-01 DIAGNOSIS — M19012 Primary osteoarthritis, left shoulder: Secondary | ICD-10-CM | POA: Diagnosis not present

## 2017-05-01 DIAGNOSIS — Z807 Family history of other malignant neoplasms of lymphoid, hematopoietic and related tissues: Secondary | ICD-10-CM | POA: Diagnosis not present

## 2017-05-01 DIAGNOSIS — Z85820 Personal history of malignant melanoma of skin: Secondary | ICD-10-CM | POA: Diagnosis not present

## 2017-05-01 HISTORY — DX: Unspecified osteoarthritis, unspecified site: M19.90

## 2017-05-01 HISTORY — DX: Dyspnea, unspecified: R06.00

## 2017-05-01 LAB — SURGICAL PCR SCREEN
MRSA, PCR: NEGATIVE
Staphylococcus aureus: NEGATIVE

## 2017-05-01 LAB — CBC
HCT: 37.7 % — ABNORMAL LOW (ref 39.0–52.0)
HEMOGLOBIN: 13 g/dL (ref 13.0–17.0)
MCH: 32.2 pg (ref 26.0–34.0)
MCHC: 34.5 g/dL (ref 30.0–36.0)
MCV: 93.3 fL (ref 78.0–100.0)
PLATELETS: 92 10*3/uL — AB (ref 150–400)
RBC: 4.04 MIL/uL — AB (ref 4.22–5.81)
RDW: 14 % (ref 11.5–15.5)
WBC: 4.1 10*3/uL (ref 4.0–10.5)

## 2017-05-01 NOTE — Progress Notes (Addendum)
PCP is Dr. Billey Gosling States he is not sure if he has ever seen a cardiologist or not.  Denies ever having a card cath,  or echo. Plans to have an echo done at the [place] in [facility] July 31. Release signed so we can get the report. Stress test noted form 2008 Denies any chest pain.

## 2017-05-04 NOTE — Progress Notes (Addendum)
Anesthesia Chart Review: Patient is a 71 year old male scheduled for left total shoulder arthroplasty versus reverse shoulder arthroplasty on 05/07/17 by Dr. Onnie Graham.  History includes former smoker (quit '69), murmur, GERD, HLD, skin cancer (BCC, melanoma), prostate cancer s/p radiation (last 01/2015), gastric cancer (adenocarcinoma) s/p sub total gastrectomy with feeding jejunostomy tube 07/18/15 (dysphagia; anastomotic stricture s/p multiple balloon dilatations), GI bleed/duodenal ulcer s/p transfusion, anemia, dyspnea,   - PCP is listed as Dr. Billey Gosling. He also receives some care through the [facility]. - GI is Dr. Silvano Rusk. - HEM-ONC is Dr. Betsy Coder, last visit 03/27/17. He wrote that patient was in clinical remission from gastric cancer. He thought patient's persistent mild thrombocytopenia, possible related to treatment for gastric cancer, ITP, or underlying liver disease. 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 4 month follow-up recommended.    Meds include 65 Fe, Prilosec, pravastatin, cyanocobalamin, MVI.   EKG 05/01/17: SB at 55 bpm, ST/T wave abnormality, consider inferolateral ischemia. Inferolateral ischemic changes are new when compared to tracings from 02/26/15 and 12/05/08. He denied chest pain at PAT.   Patient reports he is having an echocardiogram on 05/05/17 at [facility].   Normal nuclear stress test, EF 66% on 03/25/07 (see CV Procedures tab). By report, ST/T changes were non-specific.    Preoperative CBC noted. H/H 13.0/37.7. PLT 92K, range of 79-122K since 09/2016. His last CMET seen was on 03/27/17 and showed Cr 0.73, BUN 15, AST 23, ALT 8, total bilirubin 0.2, Alk Phos 83. I did not enter order for pre-operative BMET/CMET since patient had overall unremarkable CMET 03/27/17 and has no history of HTN, CKD, or liver disease. I called PLT count to Velvet at Dr. Susie Cassette office.  I reviewed EKGs with anesthesiologist Dr. Jenita Seashore on 05/01/17. Patient had  already left PAT. With new EKG changes, recommend preoperative cardiology clearance. (Left voice message with Velvet on 05/01/17.) I followed-up with Velvet today. She notify patient and follow-up on who he is seeing and specifics about testing at the [facility] this week. Chart will be left for follow-up.  [facility]/Anesthesiology Phone [phone] 05/04/2017 3:57 PM  Addendum: I received records from the [facility]. Patient was seen by Dr. Mahlon Gammon on 02/26/17 for primary care follow-up. Patient reported some episodes of dizziness upon standing from sitting or lying position, but no syncope or falls. Dizziness was felt likely due to orthostasis, but echo and EKG also ordered. Due to inferolateral T wave abnormality, a stress test was also ordered which was non-ischemic (see below).   Echo 05/05/17: Summary: 1. The left ventricle is normal in size. There is mild concentric LVH. Upper septal hypertrophy (sigmoid septum), normal variant. Left ventricular systolic function is normal. Ejection fraction 60-65% by visual estimation. No regional wall motion abnormalities noted. Grade 1 diastolic dysfunction, abnormal relaxation pattern.  2. The right ventricle is normal in size and function.  3. The left atrial volume is mildly increased. Right atrial size is normal.  4. No significant valvular stenosis is or regurgitation (trace MR/TR). 5. RVSP is normal. RVSP is estimated at 23-28 mmHg.  Nuclear stress test 03/11/17: Conclusions: 1. Myocardial perfusion stress study is normal. There is no evidence for infarct or myocardial ischemia. 2. Overall left ventricle systolic function is normal. The left ventricular end-diastolic volume is 92 mL. There were all calculated left ventricular ejection fraction is 68%. No left ventricular regional wall motion abnormalities. 3. [facility] scoring: Exercise time of 5:04 minute; maximum ST deviation of  0.5 mm. No angina. Resulting  score is 3. The score predicts a moderate risk of cardiac events. 4. Low cardiovascular risk study.  Based on recent normal stress test and echo findings, I anticipate that he can proceed as planned if no acute changes.   [facility] Short Stay Center/Anesthesiology Phone [phone] 05/06/2017 5:39 PM

## 2017-05-06 ENCOUNTER — Encounter: Payer: Self-pay | Admitting: Internal Medicine

## 2017-05-06 ENCOUNTER — Encounter (HOSPITAL_COMMUNITY): Payer: Self-pay | Admitting: Anesthesiology

## 2017-05-06 MED ORDER — CEFAZOLIN SODIUM-DEXTROSE 2-4 GM/100ML-% IV SOLN
2.0000 g | INTRAVENOUS | Status: AC
  Administered 2017-05-07: 2 g via INTRAVENOUS
  Filled 2017-05-06: qty 100

## 2017-05-06 MED ORDER — SODIUM CHLORIDE 0.9 % IV SOLN
1000.0000 mg | INTRAVENOUS | Status: AC
  Administered 2017-05-07: 1000 mg via INTRAVENOUS
  Filled 2017-05-06: qty 10

## 2017-05-06 NOTE — Anesthesia Preprocedure Evaluation (Addendum)
Anesthesia Evaluation  Patient identified by MRN, date of birth, ID band Patient awake    Reviewed: Allergy & Precautions, NPO status , Patient's Chart, lab work & pertinent test results  Airway Mallampati: II  TM Distance: >3 FB Neck ROM: Full    Dental no notable dental hx. (+) Caps,    Pulmonary shortness of breath and with exertion, former smoker,    Pulmonary exam normal breath sounds clear to auscultation       Cardiovascular negative cardio ROS Normal cardiovascular exam+ Valvular Problems/Murmurs  Rhythm:Regular Rate:Normal  Abnormal EKG NS ST-T wave changes noted on most recent EKG Had Cardiac w/u recently and cleared for surgery   Neuro/Psych negative neurological ROS  negative psych ROS   GI/Hepatic Neg liver ROS, PUD, GERD  Medicated and Controlled,Hx/o gastric Ca Hx/o esophageal stricture   Endo/Other  Hyperlipidemia   Renal/GU negative Renal ROS   Hx/o prostate Ca    Musculoskeletal  (+) Arthritis , Osteoarthritis,  OA left shoulder Hx/o melanoma   Abdominal   Peds  Hematology  (+) anemia ,   Anesthesia Other Findings   Reproductive/Obstetrics                            Anesthesia Physical Anesthesia Plan  ASA: III  Anesthesia Plan: General   Post-op Pain Management:  Regional for Post-op pain   Induction: Intravenous  PONV Risk Score and Plan: 3 and Ondansetron, Dexamethasone, Promethazine and Treatment may vary due to age or medical condition  Airway Management Planned: Oral ETT  Additional Equipment:   Intra-op Plan:   Post-operative Plan: Extubation in OR  Informed Consent: I have reviewed the patients History and Physical, chart, labs and discussed the procedure including the risks, benefits and alternatives for the proposed anesthesia with the patient or authorized representative who has indicated his/her understanding and acceptance.   Dental  advisory given  Plan Discussed with: CRNA, Anesthesiologist and Surgeon  Anesthesia Plan Comments:        Anesthesia Quick Evaluation

## 2017-05-07 ENCOUNTER — Inpatient Hospital Stay (HOSPITAL_COMMUNITY)
Admission: RE | Admit: 2017-05-07 | Discharge: 2017-05-08 | DRG: 483 | Disposition: A | Discharge status: Home / Self Care | Payer: Medicare Other | Source: Ambulatory Visit | Attending: Orthopedic Surgery | Admitting: Orthopedic Surgery

## 2017-05-07 ENCOUNTER — Encounter (HOSPITAL_COMMUNITY)
Admission: RE | Disposition: A | Discharge status: Home / Self Care | Payer: Self-pay | Source: Ambulatory Visit | Attending: Orthopedic Surgery

## 2017-05-07 ENCOUNTER — Inpatient Hospital Stay (HOSPITAL_COMMUNITY): Payer: Medicare Other | Admitting: Anesthesiology

## 2017-05-07 ENCOUNTER — Encounter (HOSPITAL_COMMUNITY): Payer: Self-pay | Admitting: Urology

## 2017-05-07 ENCOUNTER — Inpatient Hospital Stay (HOSPITAL_COMMUNITY): Payer: Medicare Other | Admitting: Vascular Surgery

## 2017-05-07 DIAGNOSIS — Z8546 Personal history of malignant neoplasm of prostate: Secondary | ICD-10-CM

## 2017-05-07 DIAGNOSIS — M19012 Primary osteoarthritis, left shoulder: Secondary | ICD-10-CM | POA: Diagnosis present

## 2017-05-07 DIAGNOSIS — Z923 Personal history of irradiation: Secondary | ICD-10-CM

## 2017-05-07 DIAGNOSIS — Z9852 Vasectomy status: Secondary | ICD-10-CM

## 2017-05-07 DIAGNOSIS — E785 Hyperlipidemia, unspecified: Secondary | ICD-10-CM | POA: Diagnosis present

## 2017-05-07 DIAGNOSIS — Z85028 Personal history of other malignant neoplasm of stomach: Secondary | ICD-10-CM | POA: Diagnosis not present

## 2017-05-07 DIAGNOSIS — Z885 Allergy status to narcotic agent status: Secondary | ICD-10-CM

## 2017-05-07 DIAGNOSIS — Z87891 Personal history of nicotine dependence: Secondary | ICD-10-CM | POA: Diagnosis not present

## 2017-05-07 DIAGNOSIS — Z96619 Presence of unspecified artificial shoulder joint: Secondary | ICD-10-CM

## 2017-05-07 DIAGNOSIS — K219 Gastro-esophageal reflux disease without esophagitis: Secondary | ICD-10-CM | POA: Diagnosis present

## 2017-05-07 DIAGNOSIS — Z903 Acquired absence of stomach [part of]: Secondary | ICD-10-CM | POA: Diagnosis not present

## 2017-05-07 DIAGNOSIS — G8918 Other acute postprocedural pain: Secondary | ICD-10-CM | POA: Diagnosis not present

## 2017-05-07 DIAGNOSIS — Z807 Family history of other malignant neoplasms of lymphoid, hematopoietic and related tissues: Secondary | ICD-10-CM | POA: Diagnosis not present

## 2017-05-07 DIAGNOSIS — Z85820 Personal history of malignant melanoma of skin: Secondary | ICD-10-CM

## 2017-05-07 HISTORY — PX: TOTAL SHOULDER ARTHROPLASTY: SHX126

## 2017-05-07 SURGERY — ARTHROPLASTY, SHOULDER, TOTAL
Anesthesia: General | Site: Shoulder | Laterality: Left

## 2017-05-07 MED ORDER — MAGNESIUM CITRATE PO SOLN: 1.0000 | Freq: Once | ORAL | Status: DC | PRN

## 2017-05-07 MED ORDER — OXYCODONE-ACETAMINOPHEN 5-325 MG PO TABS: 1.0000 | ORAL_TABLET | ORAL | 0 refills | Status: DC | PRN

## 2017-05-07 MED ORDER — PROMETHAZINE HCL 25 MG/ML IJ SOLN: 6.2500 mg | INTRAMUSCULAR | Status: DC | PRN

## 2017-05-07 MED ORDER — PHENYLEPHRINE HCL 10 MG/ML IJ SOLN
INTRAVENOUS | Status: DC | PRN
  Administered 2017-05-07: 20 ug/min via INTRAVENOUS

## 2017-05-07 MED ORDER — LACTATED RINGERS IV SOLN
INTRAVENOUS | Status: DC | PRN
  Administered 2017-05-07: 07:00:00 via INTRAVENOUS

## 2017-05-07 MED ORDER — DIPHENHYDRAMINE HCL 12.5 MG/5ML PO ELIX: 12.5000 mg | ORAL_SOLUTION | ORAL | Status: DC | PRN

## 2017-05-07 MED ORDER — ROCURONIUM BROMIDE 100 MG/10ML IV SOLN
INTRAVENOUS | Status: DC | PRN
  Administered 2017-05-07: 50 mg via INTRAVENOUS

## 2017-05-07 MED ORDER — ACETAMINOPHEN 325 MG PO TABS
650.0000 mg | ORAL_TABLET | Freq: Four times a day (QID) | ORAL | Status: DC | PRN
  Administered 2017-05-07: 650 mg via ORAL
  Filled 2017-05-07: qty 2

## 2017-05-07 MED ORDER — PROPOFOL 10 MG/ML IV BOLUS
INTRAVENOUS | Status: DC | PRN
  Administered 2017-05-07: 120 mg via INTRAVENOUS

## 2017-05-07 MED ORDER — MENTHOL 3 MG MT LOZG: 1.0000 | LOZENGE | OROMUCOSAL | Status: DC | PRN

## 2017-05-07 MED ORDER — 0.9 % SODIUM CHLORIDE (POUR BTL) OPTIME
TOPICAL | Status: DC | PRN
  Administered 2017-05-07: 1000 mL

## 2017-05-07 MED ORDER — DOCUSATE SODIUM 100 MG PO CAPS
100.0000 mg | ORAL_CAPSULE | Freq: Two times a day (BID) | ORAL | Status: DC
  Administered 2017-05-07 – 2017-05-08 (×2): 100 mg via ORAL
  Filled 2017-05-07 (×2): qty 1

## 2017-05-07 MED ORDER — MIDAZOLAM HCL 2 MG/2ML IJ SOLN
INTRAMUSCULAR | Status: AC
  Filled 2017-05-07: qty 2

## 2017-05-07 MED ORDER — MEPERIDINE HCL 25 MG/ML IJ SOLN: 6.2500 mg | INTRAMUSCULAR | Status: DC | PRN

## 2017-05-07 MED ORDER — PHENYLEPHRINE HCL 10 MG/ML IJ SOLN
INTRAMUSCULAR | Status: DC | PRN
  Administered 2017-05-07 (×3): 80 ug via INTRAVENOUS

## 2017-05-07 MED ORDER — CEFAZOLIN SODIUM-DEXTROSE 2-4 GM/100ML-% IV SOLN
2.0000 g | Freq: Four times a day (QID) | INTRAVENOUS | Status: AC
  Administered 2017-05-07 – 2017-05-08 (×3): 2 g via INTRAVENOUS
  Filled 2017-05-07 (×3): qty 100

## 2017-05-07 MED ORDER — HYDROMORPHONE HCL 1 MG/ML IJ SOLN
1.0000 mg | INTRAMUSCULAR | Status: DC | PRN
  Administered 2017-05-07 – 2017-05-08 (×3): 1 mg via INTRAVENOUS
  Filled 2017-05-07 (×3): qty 1

## 2017-05-07 MED ORDER — DIAZEPAM 5 MG PO TABS
5.0000 mg | ORAL_TABLET | Freq: Four times a day (QID) | ORAL | Status: DC | PRN
  Administered 2017-05-07: 5 mg via ORAL
  Filled 2017-05-07: qty 1

## 2017-05-07 MED ORDER — DIAZEPAM 5 MG PO TABS: 2.5000 mg | ORAL_TABLET | Freq: Four times a day (QID) | ORAL | 1 refills | Status: DC | PRN

## 2017-05-07 MED ORDER — METOCLOPRAMIDE HCL 5 MG PO TABS: 5.0000 mg | ORAL_TABLET | Freq: Three times a day (TID) | ORAL | Status: DC | PRN

## 2017-05-07 MED ORDER — ONDANSETRON HCL 4 MG/2ML IJ SOLN: 4.0000 mg | Freq: Four times a day (QID) | INTRAMUSCULAR | Status: DC | PRN

## 2017-05-07 MED ORDER — POLYETHYLENE GLYCOL 3350 17 G PO PACK: 17.0000 g | PACK | Freq: Every day | ORAL | Status: DC | PRN

## 2017-05-07 MED ORDER — OXYCODONE HCL 5 MG PO TABS
5.0000 mg | ORAL_TABLET | ORAL | Status: DC | PRN
Start: 2017-05-07 — End: 2017-05-08
  Administered 2017-05-08 (×2): 10 mg via ORAL
  Filled 2017-05-07 (×2): qty 2

## 2017-05-07 MED ORDER — PRAVASTATIN SODIUM 40 MG PO TABS
40.0000 mg | ORAL_TABLET | Freq: Every day | ORAL | Status: DC
  Administered 2017-05-07: 40 mg via ORAL
  Filled 2017-05-07: qty 1

## 2017-05-07 MED ORDER — FENTANYL CITRATE (PF) 100 MCG/2ML IJ SOLN
INTRAMUSCULAR | Status: DC | PRN
  Administered 2017-05-07: 50 ug via INTRAVENOUS
  Administered 2017-05-07: 100 ug via INTRAVENOUS

## 2017-05-07 MED ORDER — KETOROLAC TROMETHAMINE 15 MG/ML IJ SOLN
7.5000 mg | Freq: Four times a day (QID) | INTRAMUSCULAR | Status: AC
  Administered 2017-05-07 – 2017-05-08 (×3): 7.5 mg via INTRAVENOUS
  Filled 2017-05-07 (×4): qty 1

## 2017-05-07 MED ORDER — ACETAMINOPHEN 650 MG RE SUPP: 650.0000 mg | Freq: Four times a day (QID) | RECTAL | Status: DC | PRN

## 2017-05-07 MED ORDER — GLYCOPYRROLATE 0.2 MG/ML IJ SOLN
INTRAMUSCULAR | Status: DC | PRN
  Administered 2017-05-07: 0.2 mg via INTRAVENOUS

## 2017-05-07 MED ORDER — BISACODYL 5 MG PO TBEC: 5.0000 mg | DELAYED_RELEASE_TABLET | Freq: Every day | ORAL | Status: DC | PRN

## 2017-05-07 MED ORDER — SUGAMMADEX SODIUM 200 MG/2ML IV SOLN
INTRAVENOUS | Status: DC | PRN
  Administered 2017-05-07: 200 mg via INTRAVENOUS

## 2017-05-07 MED ORDER — FENTANYL CITRATE (PF) 100 MCG/2ML IJ SOLN: 25.0000 ug | INTRAMUSCULAR | Status: DC | PRN

## 2017-05-07 MED ORDER — ALUM & MAG HYDROXIDE-SIMETH 200-200-20 MG/5ML PO SUSP: 30.0000 mL | ORAL | Status: DC | PRN

## 2017-05-07 MED ORDER — CHLORHEXIDINE GLUCONATE 4 % EX LIQD: 60.0000 mL | Freq: Once | CUTANEOUS | Status: DC

## 2017-05-07 MED ORDER — MIDAZOLAM HCL 5 MG/5ML IJ SOLN
INTRAMUSCULAR | Status: DC | PRN
  Administered 2017-05-07: 2 mg via INTRAVENOUS

## 2017-05-07 MED ORDER — LACTATED RINGERS IV SOLN
INTRAVENOUS | Status: DC
  Administered 2017-05-07: 11:00:00 via INTRAVENOUS

## 2017-05-07 MED ORDER — FENTANYL CITRATE (PF) 250 MCG/5ML IJ SOLN
INTRAMUSCULAR | Status: AC
  Filled 2017-05-07: qty 5

## 2017-05-07 MED ORDER — ONDANSETRON HCL 4 MG PO TABS
4.0000 mg | ORAL_TABLET | Freq: Four times a day (QID) | ORAL | Status: DC | PRN
  Administered 2017-05-07 – 2017-05-08 (×2): 4 mg via ORAL
  Filled 2017-05-07 (×2): qty 1

## 2017-05-07 MED ORDER — PANTOPRAZOLE SODIUM 40 MG PO TBEC
40.0000 mg | DELAYED_RELEASE_TABLET | Freq: Every day | ORAL | Status: DC
  Administered 2017-05-08: 40 mg via ORAL
  Filled 2017-05-07: qty 1

## 2017-05-07 MED ORDER — METOCLOPRAMIDE HCL 5 MG/ML IJ SOLN: 5.0000 mg | Freq: Three times a day (TID) | INTRAMUSCULAR | Status: DC | PRN

## 2017-05-07 MED ORDER — ONDANSETRON HCL 4 MG PO TABS: 4.0000 mg | ORAL_TABLET | Freq: Three times a day (TID) | ORAL | 0 refills | Status: DC | PRN

## 2017-05-07 MED ORDER — BUPIVACAINE-EPINEPHRINE (PF) 0.5% -1:200000 IJ SOLN
INTRAMUSCULAR | Status: DC | PRN
  Administered 2017-05-07: 30 mL via PERINEURAL

## 2017-05-07 MED ORDER — PROPOFOL 10 MG/ML IV BOLUS
INTRAVENOUS | Status: AC
  Filled 2017-05-07: qty 20

## 2017-05-07 MED ORDER — PHENOL 1.4 % MT LIQD: 1.0000 | OROMUCOSAL | Status: DC | PRN

## 2017-05-07 MED ORDER — LIDOCAINE HCL (CARDIAC) 20 MG/ML IV SOLN
INTRAVENOUS | Status: DC | PRN
  Administered 2017-05-07: 80 mg via INTRAVENOUS

## 2017-05-07 SURGICAL SUPPLY — 68 items
BASEPLATE GLENOID SHLDR SM (Shoulder) ×3 IMPLANT
BLADE SAW SGTL 83.5X18.5 (BLADE) ×3 IMPLANT
CALIBRATOR GLENOID VIP 5-D (SYSTAGENIX WOUND MANAGEMENT) ×3 IMPLANT
COVER SURGICAL LIGHT HANDLE (MISCELLANEOUS) ×3 IMPLANT
CUP SUT UNIV REVERS 39 NEU (Shoulder) ×3 IMPLANT
DERMABOND ADHESIVE PROPEN (GAUZE/BANDAGES/DRESSINGS)
DERMABOND ADVANCED (GAUZE/BANDAGES/DRESSINGS) ×2
DERMABOND ADVANCED .7 DNX12 (GAUZE/BANDAGES/DRESSINGS) ×1 IMPLANT
DERMABOND ADVANCED .7 DNX6 (GAUZE/BANDAGES/DRESSINGS) IMPLANT
DRAPE ORTHO SPLIT 77X108 STRL (DRAPES) ×4
DRAPE SURG 17X11 SM STRL (DRAPES) ×3 IMPLANT
DRAPE SURG ORHT 6 SPLT 77X108 (DRAPES) ×2 IMPLANT
DRAPE U-SHAPE 47X51 STRL (DRAPES) ×3 IMPLANT
DRSG AQUACEL AG ADV 3.5X10 (GAUZE/BANDAGES/DRESSINGS) ×3 IMPLANT
DURAPREP 26ML APPLICATOR (WOUND CARE) ×6 IMPLANT
ELECT BLADE 4.0 EZ CLEAN MEGAD (MISCELLANEOUS) ×3
ELECT CAUTERY BLADE 6.4 (BLADE) ×3 IMPLANT
ELECT REM PT RETURN 9FT ADLT (ELECTROSURGICAL) ×3
ELECTRODE BLDE 4.0 EZ CLN MEGD (MISCELLANEOUS) ×1 IMPLANT
ELECTRODE REM PT RTRN 9FT ADLT (ELECTROSURGICAL) ×1 IMPLANT
FACESHIELD WRAPAROUND (MASK) ×12 IMPLANT
GLENOSPHERE LAT 39+4 SHOULDER (Shoulder) ×3 IMPLANT
GLOVE BIO SURGEON STRL SZ7.5 (GLOVE) ×3 IMPLANT
GLOVE BIO SURGEON STRL SZ8 (GLOVE) ×3 IMPLANT
GLOVE EUDERMIC 7 POWDERFREE (GLOVE) ×3 IMPLANT
GLOVE SS BIOGEL STRL SZ 7.5 (GLOVE) ×1 IMPLANT
GLOVE SUPERSENSE BIOGEL SZ 7.5 (GLOVE) ×2
GOWN STRL REUS W/ TWL LRG LVL3 (GOWN DISPOSABLE) ×1 IMPLANT
GOWN STRL REUS W/ TWL XL LVL3 (GOWN DISPOSABLE) ×2 IMPLANT
GOWN STRL REUS W/TWL LRG LVL3 (GOWN DISPOSABLE) ×2
GOWN STRL REUS W/TWL XL LVL3 (GOWN DISPOSABLE) ×4
INSERT HUMERAL MED 39/ +3 (Shoulder) ×1 IMPLANT
INSERT MEDIUM HUMERAL 39/ +3 (Shoulder) ×2 IMPLANT
KIT BASIN OR (CUSTOM PROCEDURE TRAY) ×3 IMPLANT
KIT ROOM TURNOVER OR (KITS) ×3 IMPLANT
MANIFOLD NEPTUNE II (INSTRUMENTS) ×3 IMPLANT
NDL SUT .5 MAYO 1.404X.05X (NEEDLE) IMPLANT
NDL SUT 6 .5 CRC .975X.05 MAYO (NEEDLE) IMPLANT
NEEDLE MAYO TAPER (NEEDLE)
NEEDLE TAPERED W/ NITINOL LOOP (MISCELLANEOUS) ×3 IMPLANT
NS IRRIG 1000ML POUR BTL (IV SOLUTION) ×3 IMPLANT
PACK SHOULDER (CUSTOM PROCEDURE TRAY) ×3 IMPLANT
PAD ARMBOARD 7.5X6 YLW CONV (MISCELLANEOUS) ×6 IMPLANT
RESTRAINT HEAD UNIVERSAL NS (MISCELLANEOUS) ×3 IMPLANT
SCREW CENTRAL NONLOCK 6.5X20MM (Shoulder) ×3 IMPLANT
SCREW LOCK PERIPHERAL 30MM (Shoulder) ×6 IMPLANT
SET PIN UNIVERSAL REVERSE (SET/KITS/TRAYS/PACK) ×3 IMPLANT
SLING ARM FOAM STRAP LRG (SOFTGOODS) ×3 IMPLANT
SLING ARM XL FOAM STRAP (SOFTGOODS) IMPLANT
SMARTMIX MINI TOWER (MISCELLANEOUS)
SPACER REVERSE UNI 39/ +6MM (Shoulder) ×1 IMPLANT
SPACER SHLD UNI REV 39 +6 (Shoulder) ×2 IMPLANT
SPONGE LAP 18X18 X RAY DECT (DISPOSABLE) ×3 IMPLANT
SPONGE LAP 4X18 X RAY DECT (DISPOSABLE) IMPLANT
STEM HUMERAL UNI REVERS SZ9 (Stem) ×3 IMPLANT
SUCTION FRAZIER HANDLE 10FR (MISCELLANEOUS) ×2
SUCTION TUBE FRAZIER 10FR DISP (MISCELLANEOUS) ×1 IMPLANT
SUT FIBERWIRE #2 38 T-5 BLUE (SUTURE) ×6
SUT MNCRL AB 3-0 PS2 18 (SUTURE) ×3 IMPLANT
SUT MON AB 2-0 CT1 36 (SUTURE) ×3 IMPLANT
SUT VIC AB 1 CT1 27 (SUTURE) ×4
SUT VIC AB 1 CT1 27XBRD ANBCTR (SUTURE) ×2 IMPLANT
SUTURE FIBERWR #2 38 T-5 BLUE (SUTURE) ×2 IMPLANT
SYR CONTROL 10ML LL (SYRINGE) IMPLANT
TOWEL OR 17X24 6PK STRL BLUE (TOWEL DISPOSABLE) IMPLANT
TOWEL OR 17X26 10 PK STRL BLUE (TOWEL DISPOSABLE) IMPLANT
TOWER SMARTMIX MINI (MISCELLANEOUS) IMPLANT
WATER STERILE IRR 1000ML POUR (IV SOLUTION) IMPLANT

## 2017-05-07 NOTE — Anesthesia Procedure Notes (Signed)
Procedure Name: Intubation Date/Time: 05/07/2017 7:39 AM Performed by: Ollen Bowl Pre-anesthesia Checklist: Patient identified, Emergency Drugs available, Suction available, Patient being monitored and Timeout performed Patient Re-evaluated:Patient Re-evaluated prior to induction Oxygen Delivery Method: Circle system utilized Preoxygenation: Pre-oxygenation with 100% oxygen Induction Type: IV induction Ventilation: Mask ventilation without difficulty Laryngoscope Size: Miller and 3 Grade View: Grade I Tube type: Oral Tube size: 7.5 mm Number of attempts: 1 Airway Equipment and Method: Patient positioned with wedge pillow and Stylet Placement Confirmation: ETT inserted through vocal cords under direct vision,  positive ETCO2 and breath sounds checked- equal and bilateral Secured at: 22 cm Tube secured with: Tape Dental Injury: Teeth and Oropharynx as per pre-operative assessment

## 2017-05-07 NOTE — H&P (Signed)
James [name]    Chief Complaint: Left shoulder osteoarthritis  HPI: The patient is a 71 y.o. 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 Abnormal EKG    NS ST-T EKG Changes, (-) Nuclear Stress Test 03/2006  . Allergy   . Anemia    iron deficency  . Arthritis   . B12 deficiency 06/05/2015   B12 Low NL at 271 methtylmalonic acid high  . Cardiac murmur    nothing to be concerned with   . Cataract    extractions  . Duodenal ulcer 1972   transfusion required, 1 unit packed cells  . Dysphagia    history throat/stomach  cancer "can swallow some, uses more pureed type foods"  . Dyspnea    at times  . Esophageal reflux    no currlently  . Gastric cancer ([place]) 07/18/15   invasive adenocarcinoma  . Gastrostomy tube in place [facility])    intermittent feeding  . Hx of basal cell carcinoma   . Hyperlipemia    no per pt 04-11-16  . Melanoma ([place])   . Prostate cancer ([place]) 09/06/14   last radiation 4'16   . S/P radiation therapy 11/29/2014 through 01/23/2015    Prostate 7800 cGy in 40 sessions, seminal vesicles 5600 cGy in 40 sessions   . Skin cancer, basal cell   . Transfusion history    '72 -s/p surgery for duodenal ulcer, 12/17    Past Surgical History:  Procedure Laterality Date  . BALLOON DILATION N/A 09/12/2015   Procedure: BALLOON DILATION;  Surgeon: Gatha Mayer, MD;  Location: [facility];  Service: Endoscopy;  Laterality: N/A;  . BALLOON DILATION N/A 09/21/2015   Procedure: BALLOON DILATION;  Surgeon: Gatha Mayer, MD;  Location: [facility];  Service: Endoscopy;  Laterality: N/A;  . BALLOON DILATION N/A 10/04/2015   Procedure: BALLOON DILATION;  Surgeon: Milus Banister, MD;  Location: [facility];  Service: Endoscopy;  Laterality: N/A;  . BALLOON DILATION N/A 12/10/2015   Procedure: BALLOON DILATION;  Surgeon: Gatha Mayer, MD;   Location: [facility];  Service: Endoscopy;  Laterality: N/A;  . BALLOON DILATION N/A 10/10/2016   Procedure: BALLOON DILATION;  Surgeon: Gatha Mayer, MD;  Location: [facility];  Service: Endoscopy;  Laterality: N/A;  . CATARACT EXTRACTION, BILATERAL Bilateral   . COLONOSCOPY    . colonoscopy with polypectomy  02/2012   Dr Sharlett Iles  . ESOPHAGOGASTRODUODENOSCOPY (EGD) WITH PROPOFOL N/A 08/17/2015   Procedure: ESOPHAGOGASTRODUODENOSCOPY (EGD) WITH PROPOFOL;  Surgeon: Gatha Mayer, MD;  Location: [place];  Service: Endoscopy;  Laterality: N/A;  . ESOPHAGOGASTRODUODENOSCOPY (EGD) WITH PROPOFOL N/A 09/12/2015   Procedure: ESOPHAGOGASTRODUODENOSCOPY (EGD) WITH PROPOFOL;  Surgeon: Gatha Mayer, MD;  [facility];  Service: Endoscopy;  Laterality: N/A;  . ESOPHAGOGASTRODUODENOSCOPY (EGD) WITH PROPOFOL N/A 09/21/2015   Procedure: ESOPHAGOGASTRODUODENOSCOPY (EGD) WITH PROPOFOL;  Surgeon: Gatha Mayer, MD;  [facility];  Service: Endoscopy;  Laterality: N/A;  . ESOPHAGOGASTRODUODENOSCOPY (EGD) WITH PROPOFOL N/A 10/04/2015   Procedure: ESOPHAGOGASTRODUODENOSCOPY (EGD) WITH PROPOFOL;  Surgeon: Milus Banister, MD;  [facility];  Service: Endoscopy;  Laterality: N/A;  possible stricture  . ESOPHAGOGASTRODUODENOSCOPY (EGD) WITH PROPOFOL N/A 12/10/2015   Procedure: ESOPHAGOGASTRODUODENOSCOPY (EGD) WITH PROPOFOL;  Surgeon: Gatha Mayer, MD;  [facility];  Service: Endoscopy;  Laterality: N/A;  . ESOPHAGOGASTRODUODENOSCOPY (EGD) WITH PROPOFOL N/A 02/06/2016   Procedure: ESOPHAGOGASTRODUODENOSCOPY (EGD) WITH PROPOFOL;  Surgeon: Glendell Docker  Simonne Maffucci, MD;  [facility];  Service: Endoscopy;  Laterality: N/A;  . ESOPHAGOGASTRODUODENOSCOPY (EGD) WITH PROPOFOL N/A 10/10/2016   Procedure: ESOPHAGOGASTRODUODENOSCOPY (EGD) WITH PROPOFOL;  Surgeon: Gatha Mayer, MD;  [facility];  Service: Endoscopy;  Laterality: N/A;  needs APC   . EUS N/A 06/14/2015    Procedure: ESOPHAGEAL ENDOSCOPIC ULTRASOUND (EUS) RADIAL;  Surgeon: Milus Banister, MD;  [facility];  Service: Endoscopy;  Laterality: N/A;  . HOT HEMOSTASIS N/A 10/10/2016   Procedure: HOT HEMOSTASIS (ARGON PLASMA COAGULATION/BICAP);  Surgeon: Gatha Mayer, MD;  Location[facility];  Service: Endoscopy;  Laterality: N/A;  . KNEE ARTHROSCOPY Right    Right Knee, [place] [facility]  . LAPAROSCOPIC GASTRECTOMY N/A 07/18/2015   Procedure: SUB TOTAL GASTRECTOMY;  Surgeon: Stark Klein, MD;  Location: [facility];  Service: General;  Laterality: N/A;  . LAPAROSCOPIC GASTROSTOMY N/A 07/18/2015   Procedure: FEEDING TUBE PLACEMENT;  Surgeon: Stark Klein, MD;  Location: [facility];  Service: General;  Laterality: N/A;  . LAPAROSCOPY N/A 07/18/2015   Procedure: LAPAROSCOPY DIAGNOSTIC;  Surgeon: Stark Klein, MD;  Location: [facility];  Service: General;  Laterality: N/A;  . MELANOMA EXCISION  02-01-16   abdominal wall  . PROSTATE BIOPSY  09/06/14  . ROTATOR CUFF REPAIR Right    R shoulder, [place]  . SAVORY DILATION N/A 02/06/2016   Procedure: SAVORY DILATION;  Surgeon: Gatha Mayer, MD;  Location: [facility];  Service: Endoscopy;  Laterality: N/A;  . TRIGGER FINGER RELEASE Left 1998  . UPPER GASTROINTESTINAL ENDOSCOPY     x14  . VASECTOMY      Family History  Problem Relation Age of Onset  . Diabetes Father   . Heart attack Father 41  . Liver disease Father        Related to Alcohol Use   . Alcohol abuse Mother   . Cirrhosis Mother        died in her 19s  . Multiple myeloma Brother   . Diabetes Sister   . Colon cancer Neg Hx   . Stomach cancer Neg Hx   . Stroke Neg Hx   . Esophageal cancer Neg Hx   . Rectal cancer Neg Hx     Social History:  reports that he quit smoking about 49 years ago. His smoking use included Cigarettes. He has a 2.50 pack-year smoking history. He has never used smokeless tobacco. He reports that he drinks alcohol. He reports that he does not use  drugs.   Medications Prior to Admission  Medication Sig Dispense Refill  . acetaminophen (TYLENOL) 500 MG tablet Take 1,000 mg by mouth every 6 (six) hours as needed for mild pain.     . cyanocobalamin 1000 MCG tablet Take 1,000 mcg by mouth every other day.     . ferrous sulfate 325 (65 FE) MG tablet Take 1 tablet (325 mg total) by mouth daily with breakfast. (Patient taking differently: Take 325 mg by mouth 2 (two) times daily with a meal. )  3  . Multiple Vitamin (MULTIVITAMIN WITH MINERALS) TABS tablet Take 1 tablet by mouth every other day.     [name] omeprazole (PRILOSEC) 40 MG capsule take 1 capsule by mouth once daily 30 MINUTES BEFORE BREAKFAST/SUPPER.  OKAY TO OPEN AND SPRINKLE ON APPLESAUCE (Patient taking differently: Take 40 mg by mouth daily. take 1 capsule by mouth once daily 30 MINUTES BEFORE BREAKFAST/SUPPER.  OKAY TO OPEN AND SPRINKLE ON APPLESAUCE) 180 capsule 3  . pravastatin (  PRAVACHOL) 40 MG tablet Take 1 tablet (40 mg total) by mouth at bedtime. Must keep June appt for future refills 90 tablet 3     Physical Exam: left shoulder with painful and restricted motion as noted at recent office visits  Vitals  Temp:  [97.8 F (36.6 C)] 97.8 F (36.6 C) (08/02 0623) Pulse Rate:  [59] 59 (08/02 0623) Resp:  [20] 20 (08/02 0623) BP: (117)/(78) 117/78 (08/02 0623) SpO2:  [98 %] 98 % (08/02 0623) Weight:  [68.5 kg (151 lb)] 68.5 kg (151 lb) (08/02 3716)  Assessment/Plan  Impression: Left shoulder osteoarthritis   Plan of Action: Procedure(s): LEFT TOTAL SHOULDER ARTHROPLASTY VERSUS REVERSE SHOULDER ARTHROPLASTY  Kleber Crean M Boyde Grieco 05/07/2017, 7:17 AM Contact # [phone]

## 2017-05-07 NOTE — Anesthesia Procedure Notes (Signed)
Anesthesia Regional Block: Interscalene brachial plexus block   Pre-Anesthetic Checklist: ,, timeout performed, Correct Patient, Correct Site, Correct Laterality, Correct Procedure, Correct Position, site marked, Risks and benefits discussed,  Surgical consent,  Pre-op evaluation,  At surgeon's request and post-op pain management  Laterality: Left  Prep: chloraprep       Needles:  Injection technique: Single-shot  Needle Type: Echogenic Stimulator Needle     Needle Length: 9cm  Needle Gauge: 21   Needle insertion depth: 4 cm   Additional Needles:   Procedures: ultrasound guided,,,,,,,,  Narrative:  Start time: 05/07/2017 7:16 AM End time: 05/07/2017 7:21 AM Injection made incrementally with aspirations every 5 mL.  Performed by: Personally  Anesthesiologist: Josephine Igo  Additional Notes: Timeout performed. Patient sedated. Relevant anatomy ID'd using [place]. Incremental 2-24ml injection of LA with frequent aspiration. Patient tolerated procedure well.

## 2017-05-07 NOTE — Discharge Instructions (Signed)

## 2017-05-07 NOTE — Transfer of Care (Signed)
Immediate Anesthesia Transfer of Care Note  Patient: James [name]  Procedure(s) Performed: Procedure(s): LEFT REVERSE TOTAL SHOULDER ARTHROPLASTY (Left)  Patient Location: PACU  Anesthesia Type:GA combined with regional for post-op pain  Level of Consciousness: awake, alert  and oriented  Airway & Oxygen Therapy: Patient Spontanous Breathing and Patient connected to nasal cannula oxygen  Post-op Assessment: Report given to RN and Post -op Vital signs reviewed and stable  Post vital signs: Reviewed and stable  Last Vitals:  Vitals:   05/07/17 0623 05/07/17 0940  BP: 117/78   Pulse: (!) 59   Resp: 20   Temp: 36.6 C (!) (P) 36.3 C    Last Pain:  Vitals:   05/07/17 0940  TempSrc:   PainSc: (P) 0-No pain         Complications: No apparent anesthesia complications

## 2017-05-07 NOTE — Anesthesia Postprocedure Evaluation (Signed)
Anesthesia Post Note  Patient: James [name]  Procedure(s) Performed: Procedure(s) (LRB): LEFT REVERSE TOTAL SHOULDER ARTHROPLASTY (Left)     Patient location during evaluation: PACU Anesthesia Type: General Level of consciousness: awake and alert Pain management: pain level controlled Vital Signs Assessment: post-procedure vital signs reviewed and stable Respiratory status: spontaneous breathing, nonlabored ventilation and respiratory function stable Cardiovascular status: blood pressure returned to baseline and stable Postop Assessment: no signs of nausea or vomiting Anesthetic complications: no    Last Vitals:  Vitals:   05/07/17 1025 05/07/17 1053  BP: 116/69   Pulse: (!) 55   Resp: 15   Temp:  (!) 36.1 C    Last Pain:  Vitals:   05/07/17 1053  TempSrc:   PainSc: 0-No pain                 Ashe Graybeal A.

## 2017-05-07 NOTE — Op Note (Signed)
05/07/2017  9:35 AM  PATIENT:   James [name]  71 y.o. male  PRE-OPERATIVE DIAGNOSIS:  Left shoulder osteoarthritis   POST-OPERATIVE DIAGNOSIS:  same  PROCEDURE:  L RSA #9 stem, +6 spacer, +3 poly on medium metaphysis, 39/+4 glenosphere  SURGEON:  Ashritha Desrosiers, Metta Clines M.D.  ASSISTANTS: [place] pac   ANESTHESIA:   GET + ISB  EBL: 150  SPECIMEN:  none  Drains: none   PATIENT DISPOSITION:  PACU - hemodynamically stable.    PLAN OF CARE: Admit for overnight observation  Dictation# [id number]   Contact # [phone]

## 2017-05-08 ENCOUNTER — Encounter (HOSPITAL_COMMUNITY): Payer: Self-pay | Admitting: Orthopedic Surgery

## 2017-05-08 MED ORDER — SODIUM CHLORIDE 0.9 % IV BOLUS (SEPSIS)
500.0000 mL | Freq: Once | INTRAVENOUS | Status: AC
  Administered 2017-05-08: 500 mL via INTRAVENOUS

## 2017-05-08 NOTE — Progress Notes (Signed)
James [name]  MRN: [id number] DOB/Age: 1946/07/22 71 y.o. Physician: Ander Slade, M.D. 1 Day Post-Op Procedure(s) (LRB): LEFT REVERSE TOTAL SHOULDER ARTHROPLASTY (Left)  Subjective: Difficult night, little rest, block wore off at 2am, now c/o nausea after po meds Vital Signs Temp:  [97 F (36.1 C)-98.5 F (36.9 C)] 98.5 F (36.9 C) (08/03 0356) Pulse Rate:  [54-69] 66 (08/03 0500) Resp:  [0-21] 18 (08/02 2128) BP: (86-116)/(48-69) 104/59 (08/03 0500) SpO2:  [92 %-100 %] 92 % (08/03 0410)  Lab Results No results for input(s): WBC, HGB, HCT, PLT in the last 72 hours. BMET No results for input(s): NA, K, CL, CO2, GLUCOSE, BUN, CREATININE, CALCIUM in the last 72 hours. INR  Date Value Ref Range Status  07/19/2015 1.21 0.00 - 1.49 Final     Exam  Dressing dry, compartments soft, n/v intact distally  Plan D/c home today, repeat 500cc NS bolus prior to d/c. F/u 2 weeks Tamica Covell M Bowyn Mercier 05/08/2017, 8:04 AM    Contact # [phone]

## 2017-05-08 NOTE — Evaluation (Signed)
Occupational Therapy Evaluation Patient Details Name: James [name] MRN: [id number] DOB: [birth year] Today's Date: 05/08/2017    History of Present Illness Pt is a 71 y.o. male s/p L TSA. PMHx: Arthritis, Cataract, Dysphagia, Gastric cancer, Melanoma, Prostate cancer, R knee arthroscopy, R rotator cuff repair.   Clinical Impression   Pt reports he was independent with ADL PTA. Currently pt min guard for functional mobility and mod assist overall for ADL. Session limited this AM by pain and nausea; plan for OT to return later today for further ADL and LUE exercises education-pt and wife aware. Pt planning to d/c home with 24/7 supervision from family. Pt would benefit from continued skilled OT to address established goals.    Follow Up Recommendations  DC plan and follow up therapy as arranged by surgeon;Supervision/Assistance - 24 hour (initially)    Equipment Recommendations  None recommended by OT    Recommendations for Other Services       Precautions / Restrictions Precautions Precautions: Shoulder Type of Shoulder Precautions: Active protocol: A/PROM FF 90, ABD 60, no resisted internal rotation. AROM elbow/wrist/hand OK. Shoulder Interventions: Shoulder sling/immobilizer;At all times;Off for dressing/bathing/exercises Precaution Booklet Issued: Yes (comment) Precaution Comments: Educated pt and wife on shoulder precautions. Required Braces or Orthoses: Sling Restrictions Weight Bearing Restrictions: Yes LUE Weight Bearing: Non weight bearing      Mobility Bed Mobility Overal bed mobility: Needs Assistance Bed Mobility: Supine to Sit;Sit to Supine     Supine to sit: Supervision;HOB elevated Sit to supine: Supervision;HOB elevated   General bed mobility comments: no assist needed. HOB elevated with use of bed rail.   Transfers Overall transfer level: Needs assistance Equipment used: None Transfers: Sit to/from Stand Sit to Stand: Min guard         General  transfer comment: min guard for safety. Pt c/o nausea, no dizziness.    Balance Overall balance assessment: Needs assistance Sitting-balance support: Feet supported;No upper extremity supported Sitting balance-Leahy Scale: Good     Standing balance support: No upper extremity supported;During functional activity Standing balance-Leahy Scale: Good                             ADL either performed or assessed with clinical judgement   ADL Overall ADL's : Needs assistance/impaired Eating/Feeding: Set up;Bed level   Grooming: Minimal assistance;Sitting   Upper Body Bathing: Moderate assistance;Sitting   Lower Body Bathing: Minimal assistance;Sit to/from stand   Upper Body Dressing : Moderate assistance;Sitting   Lower Body Dressing: Moderate assistance;Sit to/from stand   Toilet Transfer: Min guard;Ambulation           Functional mobility during ADLs: Min guard General ADL Comments: Educated pt and wife on proper positioning of LUE in bed, LUE NWB, sling management and wear schedule, ice for edema and pain, UB bathing/dressing technique.     Vision         Perception     Praxis      Pertinent Vitals/Pain Pain Assessment: 0-10 Pain Score: 6  Pain Location: L shoulder, +nausea Pain Descriptors / Indicators: Aching;Sore Pain Intervention(s): Monitored during session;Limited activity within patient's tolerance;Premedicated before session;Ice applied     Hand Dominance Right   Extremity/Trunk Assessment Upper Extremity Assessment Upper Extremity Assessment: LUE deficits/detail LUE Deficits / Details: able to wiggle fingers, reports no sensation deficits LUE: Unable to fully assess due to pain;Unable to fully assess due to immobilization   Lower Extremity Assessment Lower Extremity Assessment:  Overall [facility] for tasks assessed   Cervical / Trunk Assessment Cervical / Trunk Assessment: Normal   Communication Communication Communication: No  difficulties   Cognition Arousal/Alertness: Lethargic;Suspect due to medications Behavior During Therapy: Flat affect Overall Cognitive Status: Within Functional Limits for tasks assessed                                     General Comments       Exercises Exercises: Shoulder   Shoulder Instructions Shoulder Instructions Donning/doffing shirt without moving shoulder: Moderate assistance (educated pt and wife) Method for sponge bathing under operated UE: Moderate assistance (educated pt and wife) Donning/doffing sling/immobilizer: Moderate assistance (educated pt and wife) Correct positioning of sling/immobilizer: Minimal assistance (educated pt and wife) Sling wearing schedule (on at all times/off for ADL's): Supervision/safety (educated pt and wife) Proper positioning of operated UE when showering: Minimal assistance (educated pt and wife) Positioning of UE while sleeping: Minimal assistance (educated pt and wife)    Home Living Family/patient expects to be discharged to:: Private residence Living Arrangements: Spouse/significant other Available Help at Discharge: Family;Available 24 hours/day Type of Home: House Home Access: Stairs to enter [employer] of Steps: 1   Home Layout: One level     Bathroom Shower/Tub: Occupational psychologist: Standard     Home Equipment: Shower seat          Prior Functioning/Environment Level of Independence: Independent        Comments: playing golf and tennis        OT Problem List: Decreased strength;Decreased range of motion;Decreased activity tolerance;Impaired balance (sitting and/or standing);Decreased knowledge of use of DME or AE;Decreased knowledge of precautions;Pain;Increased edema;Impaired UE functional use      OT Treatment/Interventions: Self-care/ADL training;Therapeutic exercise;Energy conservation;DME and/or AE instruction;Therapeutic activities;Patient/family education;Balance  training    OT Goals(Current goals can be found in the care plan section) Acute Rehab OT Goals Patient Stated Goal: feel better OT Goal Formulation: With patient/family Time For Goal Achievement: 05/22/17 Potential to Achieve Goals: Good ADL Goals Pt Will Perform Upper Body Bathing: with supervision;sitting Pt Will Perform Upper Body Dressing: with supervision;sitting Pt Will Transfer to Toilet: with supervision;ambulating;regular height toilet Additional ADL Goal #1: Pt will independently perform LUE A/PROM exercises. Additional ADL Goal #2: Pt/caregiver will independently don/doff sling.  OT Frequency: Min 3X/week   Barriers to D/C:            Co-evaluation              AM-PAC PT "6 Clicks" Daily Activity     Outcome Measure Help from another person eating meals?: A Little Help from another person taking care of personal grooming?: A Little Help from another person toileting, which includes using toliet, bedpan, or urinal?: A Little Help from another person bathing (including washing, rinsing, drying)?: A Lot Help from another person to put on and taking off regular upper body clothing?: A Lot Help from another person to put on and taking off regular lower body clothing?: A Lot 6 Click Score: 15   End of Session Equipment Utilized During Treatment: Other (comment) (sling)  Activity Tolerance: Patient limited by lethargy;Other (comment) (nausea) Patient left: in bed;with call bell/phone within reach;with family/visitor present  OT Visit Diagnosis: Unsteadiness on feet (R26.81);Pain Pain - Right/Left: Left Pain - part of body: Shoulder  Time: 1552-0802 OT Time Calculation (min): 16 min Charges:  OT General Charges $OT Visit: 1 Procedure OT Evaluation $OT Eval Moderate Complexity: 1 Procedure G-Codes:     Mel Almond A. Ulice Brilliant, M.S., OTR/L Pager: [place] 05/08/2017, 9:08 AM

## 2017-05-08 NOTE — Progress Notes (Signed)
Pt discharge instructions reviewed with pt and wife. Pt and wife verbalize understanding. Pt belongings with pt. Pt wife is driving him home. Pt discharged via wheelchair. Pt is not in distress.

## 2017-05-08 NOTE — Op Note (Signed)
[name], [name] NO.:  MEDICAL RECORD NO.:  [id number]  LOCATION:                                 FACILITY:  PHYSICIAN:  Metta Clines. Joshuajames Moehring, M.D.       DATE OF BIRTH:  DATE OF PROCEDURE:  05/07/2017 DATE OF DISCHARGE:                              OPERATIVE REPORT   PREOPERATIVE DIAGNOSIS:  End-stage left shoulder osteoarthritis.  POSTOPERATIVE DIAGNOSIS:  End-stage left shoulder osteoarthritis.  PROCEDURE:  Left reverse shoulder arthroplasty utilizing a press-fit size 9 Arthrex stem, a +6 spacer, +3 poly on the medial metaphysis and a 39 +4 glenosphere on a small baseplate.  SURGEON:  Metta Clines. Mariaelena Cade, M.D.  Terrence DupontOlivia Mackie A. Shuford, P.A.-C.  ANESTHESIA:  General endotracheal as well as interscalene block.  ESTIMATED BLOOD LOSS:  150 mL.  DRAINS:  None.  HISTORY:  James [name] is a 71 year old gentleman who has had chronic and progressive increasing left shoulder pain with associated functional limitations related to end-stage glenohumeral arthritis.  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 His preoperative evaluation included CT scan for evaluation of the glenoid version which showed 37 degrees of retroversion and given the degree of retroversion, it was felt that he would benefit from the use of a reverse prosthesis to help with management of the soft tissues and reestablish proper alignment about the joint.  I preoperatively counseled James [name] regarding treatment options and potential risks versus benefits thereof.  Possible surgical complications were all reviewed including bleeding, infection, neurovascular injury, persistent pain, loss of motion, anesthetic complication, failure of the implant, and possible need for additional surgery.  He understands and accepts and agrees with the planned procedure.  DESCRIPTION OF PROCEDURE:  After undergoing routine preop evaluation, the patient  received prophylactic antibiotics.  An interscalene block was established in the holding area by the Anesthesia Department. Placed supine on the operating table.  Underwent smooth induction of a general endotracheal anesthesia.  Placed in the beach-chair position and appropriately padded and protected.  Left shoulder examination under anesthesia did reveal nearly full motion.  Left upper extremity was then sterilely prepped and draped in standard fashion.  Time-out was called. An anterior deltopectoral approach to the left shoulder was made through a 10 cm incision.  Skin flaps were elevated.  Dissection carried deeply. Cephalic vein taken laterally with the deltoid.  Pectoralis major retracted medially and the upper centimeter of the pectoralis major tendon was tenotomized to enhance exposure.  There were a number of varicosities about the cephalic vein and ultimately, we did perform a ligation of the vein distally during the exposure.  The conjoint tendon was identified and retracted medially.  The biceps tendon was then unroofed, tenotomized for later tenodesis.  We then divided the subscapularis away from the lesser tuberosity and then tagged the free margin with a pair of grasping #2 FiberWire sutures.  We split the rotator interval at the base of the coracoid and retracted the subscapularis medially.  We then divided the capsular attachments from the anterior-inferior and inferior aspects of the humeral neck, allowing complete delivery of  the humeral head through the wound, which showed severe bony deformity and complete loss of articular cartilage.  Using our extramedullary guide, we performed the humeral head resection, matched the native retroversion approximately 30 degrees.  We then removed osteophytes from the humeral neck.  We placed a medium metal cap over the cut surface of the proximal humerus, and at this point, we then exposed the glenoid with combination of Fukuda,  pitchfork, and snake tongue retractors.  We performed a circumferential labral resection, gained complete visualization of the periphery of the glenoid, and this did indeed show severe retroversion.  We then utilized the Arthrex guide which had been preset based on our CT scan measurements, and this allowed [place] to place a guide pin into the center of the glenoid with correction of the severe retroversion and this was then reamed centrally followed by a peripheral reamer and we repeated this process 3 times, such that we ultimately got down to a stable bony bed with corrected version and had 70% to 80% bony contours exposed with our reaming with just a very small posterior deficiency, perhaps 1 mm.  At this point, we then placed our small glenoid base plate and set our central lag screw followed by inferior and superior locking screws, all of which obtained excellent bony purchase and fixation.  I performed peripheral reaming of the margins of the glenoid and then impacted our 39 +4 glenosphere with excellent fit and fixation.  At this point, we then returned our attention to the proximal humerus where we performed hand reaming the canal up to size 7 and broached up to size 9 which had excellent fit and fixation.  The alignment allowed a central reaming peg and the metaphysis was then reamed.  We then assembled a trial.  This was impacted and reduction showed good soft tissue balance, good stability of our construct.  Our trial stem was then removed.  The final stem was then assembled on the back table and the final implant was then impacted into the canal, obtaining excellent interference fit and fixation.  We then performed a series of trial reductions and ultimately felt that a total of +9 extension off the implant was appropriate and at this time, we placed our +6 metaphyseal extension, +3 poly, and this then allowed [place] to perform a final reduction which showed excellent soft  tissue balance and showed excellent stability of our construct.  The wound was copiously irrigated.  Hemostasis was obtained.  The subscapularis was then repaired back to the lesser tuberosity region using eyelets on the collar of our humeral stem.  The biceps tendon was then tenodesed at the upper border of the pectoralis major tendon.  Copious irrigation was then again completed.  The deltopectoral interval was then reapproximated with a series of figure-of-eight #1 Vicryl sutures.  2-0 Monocryl was used for the subcu layer, intracuticular 3-0 Monocryl for the skin followed by Dermabond and Aquacel dressing.  Left arm was placed in a sling.  The patient was awakened, extubated, and taken to the recovery room in stable condition.  Tracy A. Shuford, P.A.-C., was used as an Environmental consultant throughout this case, was essential for help with positioning the patient, positioning the extremity, tissue manipulation, retraction, implantation of prosthesis, wound closure, and intraoperative decision making.     Metta Clines. Natalyia Innes, M.D.     KMS/MEDQ  D:  05/07/2017  T:  05/07/2017  Job:  [id number]

## 2017-05-08 NOTE — Progress Notes (Signed)
Occupational Therapy Treatment Patient Details Name: James [name] MRN: [id number] DOB: 1945-10-27 Today's Date: 05/08/2017    History of present illness Pt is a 71 y.o. male s/p L TSA. PMHx: Arthritis, Cataract, Dysphagia, Gastric cancer, Melanoma, Prostate cancer, R knee arthroscopy, R rotator cuff repair.   OT comments  Pt with improved arousal and mobility this session. Pt able to perform UB/LB dressing with min-mod assist from his wife. Educated pt and wife on LUE exercises; pt tolerated x10 each well. D/c plan remains appropriate. Will continue to follow acutely.   Follow Up Recommendations  DC plan and follow up therapy as arranged by surgeon;Supervision/Assistance - 24 hour (initially)    Equipment Recommendations  None recommended by OT    Recommendations for Other Services      Precautions / Restrictions Precautions Precautions: Shoulder Type of Shoulder Precautions: Active protocol: A/PROM FF 90, ABD 60, no resisted internal rotation. AROM elbow/wrist/hand OK. Shoulder Interventions: Shoulder sling/immobilizer;At all times;Off for dressing/bathing/exercises Precaution Booklet Issued: Yes (comment) Precaution Comments: Educated pt and wife on shoulder precautions. Required Braces or Orthoses: Sling Restrictions Weight Bearing Restrictions: Yes LUE Weight Bearing: Non weight bearing       Mobility Bed Mobility Overal bed mobility: Modified Independent        Transfers Overall transfer level: Needs assistance Equipment used: None Transfers: Sit to/from Stand Sit to Stand: Supervision         General transfer comment: for safety, improved balance from this AM    Balance Overall balance assessment: No apparent balance deficits (not formally assessed) Sitting-balance support: Feet supported;No upper extremity supported Sitting balance-Leahy Scale: Good     Standing balance support: No upper extremity supported;During functional activity Standing  balance-Leahy Scale: Good                             ADL either performed or assessed with clinical judgement   ADL Overall ADL's : Needs assistance/impaired             Upper Body Dressing : Minimal assistance;Sitting Upper Body Dressing Details (indicate cue type and reason): Good technique with wife assisting Lower Body Dressing: Moderate assistance;Sit to/from stand Lower Body Dressing Details (indicate cue type and reason): wife assisting as needed Toilet Transfer: Supervision/safety;Ambulation           Functional mobility during ADLs: Supervision/safety General ADL Comments: Educated pt and wife on proper positioning of LUE in bed, LUE NWB, sling management and wear schedule, ice for edema and pain, UB bathing/dressing technique.     Vision       Perception     Praxis      Cognition Arousal/Alertness: Awake/alert Behavior During Therapy: WFL for tasks assessed/performed Overall Cognitive Status: Within Functional Limits for tasks assessed                                          Exercises Exercises: Shoulder Shoulder Exercises Shoulder Flexion: AROM;Self ROM;Left;10 reps;Seated (0-30 degrees) Shoulder ABduction: AROM;Self ROM;Left;10 reps;Seated (0-45 degrees) Elbow Flexion: AROM;Left;10 reps;Seated Wrist Flexion: AROM;Left;10 reps;Seated Digit Composite Flexion: AROM;Left;10 reps;Seated   Shoulder Instructions Shoulder Instructions Donning/doffing shirt without moving shoulder: Minimal assistance;Caregiver independent with task Method for sponge bathing under operated UE: Minimal assistance;Caregiver independent with task Donning/doffing sling/immobilizer: Moderate assistance;Caregiver independent with task Correct positioning of sling/immobilizer: Supervision/safety;Caregiver independent with task;Patient able to independently  direct caregiver ROM for elbow, wrist and digits of operated UE: Supervision/safety;Caregiver  independent with task Sling wearing schedule (on at all times/off for ADL's): Supervision/safety;Caregiver independent with task Proper positioning of operated UE when showering: Minimal assistance;Caregiver independent with task Positioning of UE while sleeping: Minimal assistance;Caregiver independent with task     General Comments      Pertinent Vitals/ Pain       Pain Assessment: Faces Pain Score: 6  Faces Pain Scale: Hurts little more Pain Location: L shoulder with exercises Pain Descriptors / Indicators: Grimacing;Guarding Pain Intervention(s): Limited activity within patient's tolerance;Monitored during session;Repositioned;Ice applied  Home Living           Prior Functioning/Environment    Frequency  Min 3X/week        Progress Toward Goals  OT Goals(current goals can now be found in the care plan section)  Progress towards OT goals: Progressing toward goals  Acute Rehab OT Goals Patient Stated Goal: go home OT Goal Formulation: With patient/family Time For Goal Achievement: 05/22/17 Potential to Achieve Goals: Good ADL Goals Pt Will Perform Upper Body Bathing: with supervision;sitting Pt Will Perform Upper Body Dressing: with supervision;sitting Pt Will Transfer to Toilet: with supervision;ambulating;regular height toilet Additional ADL Goal #1: Pt will independently perform LUE A/PROM exercises. Additional ADL Goal #2: Pt/caregiver will independently don/doff sling.  Plan Discharge plan remains appropriate    Co-evaluation                 AM-PAC PT "6 Clicks" Daily Activity     Outcome Measure   Help from another person eating meals?: A Little Help from another person taking care of personal grooming?: A Little Help from another person toileting, which includes using toliet, bedpan, or urinal?: A Little Help from another person bathing (including washing, rinsing, drying)?: A Little Help from another person to put on and taking off regular  upper body clothing?: A Little Help from another person to put on and taking off regular lower body clothing?: A Lot 6 Click Score: 17    End of Session Equipment Utilized During Treatment: Other (comment) (sling)  OT Visit Diagnosis: Unsteadiness on feet (R26.81);Pain Pain - Right/Left: Left Pain - part of body: Shoulder   Activity Tolerance Patient tolerated treatment well   Patient Left in chair;with call bell/phone within reach;with family/visitor present   Nurse Communication Other (comment) (pt ready for d/c from OT standpoint)        Time: 8341-9622 OT Time Calculation (min): 24 min  Charges: OT General Charges $OT Visit: 1 Procedure OT Evaluation $OT Eval Moderate Complexity: 1 Procedure OT Treatments $Self Care/Home Management : 8-22 mins $Therapeutic Exercise: 8-22 mins  Froylan Hobby A. Ulice Brilliant, M.S., OTR/L Pager: [place] 05/08/2017, 12:09 PM

## 2017-05-08 NOTE — Progress Notes (Signed)
Pt. Manual BP 86/58, at 0415, asymptomatic, MD notified, verbal order of 500 ml bolus from Doctor Scot Dock, B. Initiated, call light within reach, will continue to assess and monitor patient.

## 2017-05-08 NOTE — Progress Notes (Signed)
Pt.  BP  104/59 at 0530 after 500 ml bolus completed, will continue to monitor.

## 2017-05-08 NOTE — Progress Notes (Signed)
RN gave pt 500 cc NS bolus per MD verbal order.

## 2017-05-15 DIAGNOSIS — C161 Malignant neoplasm of fundus of stomach: Secondary | ICD-10-CM | POA: Diagnosis not present

## 2017-05-18 DIAGNOSIS — Z96612 Presence of left artificial shoulder joint: Secondary | ICD-10-CM | POA: Diagnosis not present

## 2017-05-18 DIAGNOSIS — Z471 Aftercare following joint replacement surgery: Secondary | ICD-10-CM | POA: Diagnosis not present

## 2017-05-18 NOTE — Discharge Summary (Signed)
PATIENT ID:      James [name]  MRN:     [id number] DOB/AGE:    Mar 12, 1946 / 71 y.o.     DISCHARGE SUMMARY  ADMISSION DATE:    05/07/2017 DISCHARGE DATE:  05/08/2017  ADMISSION DIAGNOSIS: Left shoulder osteoarthritis  Past Medical History:  Diagnosis Date  . Abnormal EKG    NS ST-T EKG Changes, (-) Nuclear Stress Test 03/2006  . Allergy   . Anemia    iron deficency  . Arthritis   . B12 deficiency 06/05/2015   B12 Low NL at 271 methtylmalonic acid high  . Cardiac murmur    nothing to be concerned with   . Cataract    extractions  . Duodenal ulcer 1972   transfusion required, 1 unit packed cells  . Dysphagia    history throat/stomach  cancer "can swallow some, uses more pureed type foods"  . Dyspnea    at times  . Esophageal reflux    no currlently  . Gastric cancer ([employer]) 07/18/15   invasive adenocarcinoma  . Gastrostomy tube in place [facility])    intermittent feeding  . Hx of basal cell carcinoma   . Hyperlipemia    no per pt 04-11-16  . Melanoma ([place])   . Prostate cancer ([place]) 09/06/14   last radiation 4'16   . S/P radiation therapy 11/29/2014 through 01/23/2015    Prostate 7800 cGy in 40 sessions, seminal vesicles 5600 cGy in 40 sessions   . Skin cancer, basal cell   . Transfusion history    '72 -s/p surgery for duodenal ulcer, 12/17    DISCHARGE DIAGNOSIS:   Active Problems:   S/p reverse total shoulder arthroplasty   PROCEDURE: Procedure(s): LEFT REVERSE TOTAL SHOULDER ARTHROPLASTY on 05/07/2017  CONSULTS:    HISTORY:  See H&P in chart.  HOSPITAL COURSE:  James [name] is a 71 y.o. admitted on 05/07/2017 with a diagnosis of Left shoulder osteoarthritis .  They were brought to the operating room on 05/07/2017 and underwent Procedure(s): LEFT REVERSE TOTAL SHOULDER ARTHROPLASTY.    They were given perioperative antibiotics:  Anti-infectives    Start     Dose/Rate Route Frequency Ordered Stop   05/07/17 1400  ceFAZolin (ANCEF) IVPB 2g/100 mL premix     2 g 200 mL/hr over 30 Minutes Intravenous Every 6 hours 05/07/17 1107 05/08/17 0315   05/07/17 0600  ceFAZolin (ANCEF) IVPB 2g/100 mL premix     2 g 200 mL/hr over 30 Minutes Intravenous To ShortStay Surgical 05/06/17 0756 05/07/17 0745    .  Patient underwent the above named procedure and tolerated it well. The following day they were hemodynamically stable and pain was controlled on oral analgesics. They were neurovascularly intact to the operative extremity. OT was ordered and worked with patient per protocol. They were medically and orthopaedically stable for discharge on 05/08/2017.     DIAGNOSTIC STUDIES:  RECENT RADIOGRAPHIC STUDIES :  No results found.  RECENT VITAL SIGNS:  No data found. [name]  RECENT EKG RESULTS:    Orders placed or performed during the hospital encounter of 05/07/17  . EKG 12-Lead  . EKG 12-Lead    DISCHARGE INSTRUCTIONS:  Discharge Instructions    Call MD / Call 911    Complete by:  As directed    If you experience chest pain or shortness of breath, CALL 911 and be transported to the hospital emergency room.  If you develope a fever above 101 F, pus (white drainage) or increased drainage  or redness at the wound, or calf pain, call your surgeon's office.   Constipation Prevention    Complete by:  As directed    Drink plenty of fluids.  Prune juice may be helpful.  You may use a stool softener, such as Colace (over the counter) 100 mg twice a day.  Use MiraLax (over the counter) for constipation as needed.   Diet - low sodium heart healthy    Complete by:  As directed    Increase activity slowly as tolerated    Complete by:  As directed       DISCHARGE MEDICATIONS:   Allergies as of 05/08/2017      Reactions   Codeine Nausea And Vomiting      Medication List    TAKE these medications   acetaminophen 500 MG tablet Commonly known as:  TYLENOL Take 1,000 mg by mouth every 6 (six) hours as  needed for mild pain.   cyanocobalamin 1000 MCG tablet Take 1,000 mcg by mouth every other day.   diazepam 5 MG tablet Commonly known as:  VALIUM Take 0.5-1 tablets (2.5-5 mg total) by mouth every 6 (six) hours as needed for muscle spasms or sedation.   ferrous sulfate 325 (65 FE) MG tablet Take 1 tablet (325 mg total) by mouth daily with breakfast. What changed:  when to take this   multivitamin with minerals Tabs tablet Take 1 tablet by mouth every other day.   omeprazole 40 MG capsule Commonly known as:  PRILOSEC take 1 capsule by mouth once daily 30 MINUTES BEFORE BREAKFAST/SUPPER.  OKAY TO OPEN AND SPRINKLE ON APPLESAUCE What changed:  how much to take  how to take this  when to take this  additional instructions   ondansetron 4 MG tablet Commonly known as:  ZOFRAN Take 1 tablet (4 mg total) by mouth every 8 (eight) hours as needed for nausea or vomiting.   oxyCODONE-acetaminophen 5-325 MG tablet Commonly known as:  PERCOCET Take 1-2 tablets by mouth every 4 (four) hours as needed.   pravastatin 40 MG tablet Commonly known as:  PRAVACHOL Take 1 tablet (40 mg total) by mouth at bedtime. Must keep June appt for future refills       FOLLOW UP VISIT:   Follow-up Information    Justice Britain, MD.   Specialty:  Orthopedic Surgery Why:  call to be seen in 10-14 days Contact information: [address] [place] [address] [phone]           DISCHARGE TO: home   DISCHARGE CONDITION:  Festus Barren for Dr. Justice Britain 05/18/2017, 11:44 AM

## 2017-05-27 ENCOUNTER — Encounter: Payer: Self-pay | Admitting: Physical Therapy

## 2017-05-27 ENCOUNTER — Ambulatory Visit: Payer: Medicare Other | Attending: Orthopedic Surgery | Admitting: Physical Therapy

## 2017-05-27 DIAGNOSIS — M25612 Stiffness of left shoulder, not elsewhere classified: Secondary | ICD-10-CM | POA: Insufficient documentation

## 2017-05-27 DIAGNOSIS — R2232 Localized swelling, mass and lump, left upper limb: Secondary | ICD-10-CM | POA: Diagnosis not present

## 2017-05-27 DIAGNOSIS — M6281 Muscle weakness (generalized): Secondary | ICD-10-CM | POA: Diagnosis not present

## 2017-05-27 DIAGNOSIS — R262 Difficulty in walking, not elsewhere classified: Secondary | ICD-10-CM | POA: Diagnosis not present

## 2017-05-27 DIAGNOSIS — M25512 Pain in left shoulder: Secondary | ICD-10-CM | POA: Insufficient documentation

## 2017-05-27 NOTE — Therapy (Signed)
[address] [place], [place], [address] Phone: [phone]   Fax:  [phone]  Physical Therapy Evaluation  Patient Details  Name: James [name] MRN: [id number] Date of Birth: 1946-06-21 Referring Provider: Onnie Graham  Encounter Date: 05/27/2017      PT End of Session - 05/27/17 1110    Visit Number 1   Date for PT Re-Evaluation 07/27/17   PT Start Time 0930   PT Stop Time 1013   PT Time Calculation (min) 43 min   Activity Tolerance Patient limited by pain   Behavior During Therapy [facility] for tasks assessed/performed      Past Medical History:  Diagnosis Date  . Abnormal EKG    NS ST-T EKG Changes, (-) Nuclear Stress Test 03/2006  . Allergy   . Anemia    iron deficency  . Arthritis   . B12 deficiency 06/05/2015   B12 Low NL at 271 methtylmalonic acid high  . Cardiac murmur    nothing to be concerned with   . Cataract    extractions  . Duodenal ulcer 1972   transfusion required, 1 unit packed cells  . Dysphagia    history throat/stomach  cancer "can swallow some, uses more pureed type foods"  . Dyspnea    at times  . Esophageal reflux    no currlently  . Gastric cancer ([place]) 07/18/15   invasive adenocarcinoma  . Gastrostomy tube in place [facility])    intermittent feeding  . Hx of basal cell carcinoma   . Hyperlipemia    no per pt 04-11-16  . Melanoma ([place])   . Prostate cancer ([place]) 09/06/14   last radiation 4'16   . S/P radiation therapy 11/29/2014 through 01/23/2015    Prostate 7800 cGy in 40 sessions, seminal vesicles 5600 cGy in 40 sessions   . Skin cancer, basal cell   . Transfusion history    '72 -s/p surgery for duodenal ulcer, 12/17    Past Surgical History:  Procedure Laterality Date  . BALLOON DILATION N/A 09/12/2015   Procedure: BALLOON DILATION;  Surgeon: Gatha Mayer, MD;  Location: [facility];  Service:  Endoscopy;  Laterality: N/A;  . BALLOON DILATION N/A 09/21/2015   Procedure: BALLOON DILATION;  Surgeon: Gatha Mayer, MD;  [facility];  Service: Endoscopy;  Laterality: N/A;  . BALLOON DILATION N/A 10/04/2015   Procedure: BALLOON DILATION;  Surgeon: Milus Banister, MD;  [facility];  Service: Endoscopy;  Laterality: N/A;  . BALLOON DILATION N/A 12/10/2015   Procedure: BALLOON DILATION;  Surgeon: Gatha Mayer, MD;  [facility];  Service: Endoscopy;  Laterality: N/A;  . BALLOON DILATION N/A 10/10/2016   Procedure: BALLOON DILATION;  Surgeon: Gatha Mayer, MD;  [facility];  Service: Endoscopy;  Laterality: N/A;  . CATARACT EXTRACTION, BILATERAL Bilateral   . COLONOSCOPY    . colonoscopy with polypectomy  02/2012   Dr Sharlett Iles  . ESOPHAGOGASTRODUODENOSCOPY (EGD) WITH PROPOFOL N/A 08/17/2015   Procedure: ESOPHAGOGASTRODUODENOSCOPY (EGD) WITH PROPOFOL;  Surgeon: Gatha Mayer, MD;  Location: [place];  Service: Endoscopy;  Laterality: N/A;  . ESOPHAGOGASTRODUODENOSCOPY (EGD) WITH PROPOFOL N/A 09/12/2015   Procedure: ESOPHAGOGASTRODUODENOSCOPY (EGD) WITH PROPOFOL;  Surgeon: Gatha Mayer, MD;  [facility];  Service: Endoscopy;  Laterality: N/A;  . ESOPHAGOGASTRODUODENOSCOPY (EGD) WITH PROPOFOL N/A 09/21/2015   Procedure: ESOPHAGOGASTRODUODENOSCOPY (EGD) WITH PROPOFOL;  Surgeon: Gatha Mayer, MD;  [facility];  Service: Endoscopy;  Laterality: N/A;  . ESOPHAGOGASTRODUODENOSCOPY (EGD) WITH PROPOFOL N/A 10/04/2015   Procedure: ESOPHAGOGASTRODUODENOSCOPY (EGD) WITH PROPOFOL;  Surgeon: Milus Banister, MD;  [facility];  Service: Endoscopy;  Laterality: N/A;  possible stricture  . ESOPHAGOGASTRODUODENOSCOPY (EGD) WITH PROPOFOL N/A 12/10/2015   Procedure: ESOPHAGOGASTRODUODENOSCOPY (EGD) WITH PROPOFOL;  Surgeon: Gatha Mayer, MD;  [facility];  Service: Endoscopy;  Laterality: N/A;  .  ESOPHAGOGASTRODUODENOSCOPY (EGD) WITH PROPOFOL N/A 02/06/2016   Procedure: ESOPHAGOGASTRODUODENOSCOPY (EGD) WITH PROPOFOL;  Surgeon: Gatha Mayer, MD;  [facility];  Service: Endoscopy;  Laterality: N/A;  . ESOPHAGOGASTRODUODENOSCOPY (EGD) WITH PROPOFOL N/A 10/10/2016   Procedure: ESOPHAGOGASTRODUODENOSCOPY (EGD) WITH PROPOFOL;  Surgeon: Gatha Mayer, MD;  [facility];  Service: Endoscopy;  Laterality: N/A;  needs APC   . EUS N/A 06/14/2015   Procedure: ESOPHAGEAL ENDOSCOPIC ULTRASOUND (EUS) RADIAL;  Surgeon: Milus Banister, MD;  [facility];  Service: Endoscopy;  Laterality: N/A;  . HOT HEMOSTASIS N/A 10/10/2016   Procedure: HOT HEMOSTASIS (ARGON PLASMA COAGULATION/BICAP);  Surgeon: Gatha Mayer, MD;  [facility];  Service: Endoscopy;  Laterality: N/A;  . KNEE ARTHROSCOPY Right    Right Knee, [place] [facility]  . LAPAROSCOPIC GASTRECTOMY N/A 07/18/2015   Procedure: SUB TOTAL GASTRECTOMY;  Surgeon: Stark Klein, MD;  [facility];  Service: General;  Laterality: N/A;  . LAPAROSCOPIC GASTROSTOMY N/A 07/18/2015   Procedure: FEEDING TUBE PLACEMENT;  Surgeon: Stark Klein, MD;  [facility];  Service: General;  Laterality: N/A;  . LAPAROSCOPY N/A 07/18/2015   Procedure: LAPAROSCOPY DIAGNOSTIC;  Surgeon: Stark Klein, MD;  [facility];  Service: General;  Laterality: N/A;  . MELANOMA EXCISION  02-01-16   abdominal wall  . PROSTATE BIOPSY  09/06/14  . ROTATOR CUFF REPAIR Right    R shoulder, [place]  . SAVORY DILATION N/A 02/06/2016   Procedure: SAVORY DILATION;  Surgeon: Gatha Mayer, MD;  [facility];  Service: Endoscopy;  Laterality: N/A;  . TOTAL SHOULDER ARTHROPLASTY Left 05/07/2017   Procedure: LEFT REVERSE TOTAL SHOULDER ARTHROPLASTY;  Surgeon: Justice Britain, MD;  Location: [place];  Service: Orthopedics;  Laterality: Left;  . TRIGGER FINGER RELEASE Left 1998  . UPPER GASTROINTESTINAL ENDOSCOPY     x14  . VASECTOMY      There were  no vitals filed for this visit.       Subjective Assessment - 05/27/17 0933    Subjective Patient reports that he had been having left shoulder pain for years, reports that he was starting to lose his ROM and was having difficulty sleeping.  Had a reverse TSR on 05/07/17.  He is in a sling and reports that he has been doing the exercises that were given to him.     Limitations Lifting;House hold activities   Patient Stated Goals have less pain, better motions and golf and tennis   Currently in Pain? Yes   Pain Score 1    Pain Location Shoulder   Pain Orientation Left   Pain Descriptors / Indicators Aching;Sore   Pain Type Surgical pain   Pain Onset 1 to 4 weeks ago   Pain Frequency Constant   Aggravating Factors  motions of the arm, sometimes just sitting, 7/10   Pain Relieving Factors rest, ice, sling, Tylenol pain can be 0/10   Effect of Pain on Daily Activities limits all ADL's            [facility] PT Assessment - 05/27/17 0001  Assessment   Medical Diagnosis s/p left reverse TSR   Referring Provider Supple   Onset Date/Surgical Date 05/07/17   Hand Dominance Right   Prior Therapy no     Precautions   Precautions None     Balance Screen   Has the patient fallen in the past 6 months No   Has the patient had a decrease in activity level because of a fear of falling?  No   Is the patient reluctant to leave their home because of a fear of falling?  No     Home Environment   Additional Comments was doing some yardwork, gardening     Prior Function   Level of Independence Independent   Vocation Retired   Leisure play golf and tennis 1x/week     Observation/Other Assessments-Edema    Edema --  some edema and bruising of the left shoulder     Posture/Postural Control   Posture Comments fwd head, rounded shoulders, in sling     ROM / Strength   AROM / PROM / Strength PROM     PROM   Overall PROM Comments left shoulder extension 22 degrees from full, possibly due  to sling   PROM Assessment Site Shoulder   Right/Left Shoulder Left   Left Shoulder Flexion 70 Degrees   Left Shoulder ABduction 65 Degrees   Left Shoulder Internal Rotation 30 Degrees   Left Shoulder External Rotation 10 Degrees            Objective measurements completed on examination: See above findings.                  PT Education - 05/27/17 1109    Education provided Yes   Education Details PROM of flexion and ER, shoulder shrugs, scapular retraction and elbow extension   Person(s) Educated Patient;Spouse   Methods Explanation;Demonstration;Tactile cues;Verbal cues;Handout   Comprehension Verbalized understanding;Returned demonstration;Verbal cues required          PT Short Term Goals - 05/27/17 1126      PT SHORT TERM GOAL #1   Title independent with initial HEP   Time 2   Period Weeks   Status New           PT Long Term Goals - 05/27/17 1126      PT LONG TERM GOAL #1   Title return to playing tennis 1x/week   Time 12   Period Weeks   Status New     PT LONG TERM GOAL #2   Title Increase UE 4/5   Time 8   Period Weeks   Status New     PT LONG TERM GOAL #3   Title increase AROM of left shoulder flexion to 110 degrees   Time 8   Period Weeks   Status New     PT LONG TERM GOAL #4   Title decrease pain 50%   Time 8   Period Weeks   Status New     PT LONG TERM GOAL #5   Title dress and do hair without difficulty   Time 8   Period Weeks   Status New                Plan - 05/27/17 1111    Clinical Impression Statement Patient reports that he had left shoulder pain for a number of years, he is an avid Curator.  He underwent a left reverse total shoulder replacement on 05/07/17.  See MD protocol scanned  in chart to follow.   Clinical Presentation Evolving   Clinical Presentation due to: recent surgery   Clinical Decision Making Low   Rehab Potential Good   PT Frequency 2x / week   PT Duration 8 weeks    PT Treatment/Interventions ADLs/Self Care Home Management;Cryotherapy;Electrical Stimulation;Patient/family education;Therapeutic exercise;Therapeutic activities;Manual techniques;Vasopneumatic Device   PT Next Visit Plan follow MD protocol for reverse total shoulder   Consulted and Agree with Plan of Care Patient      Patient will benefit from skilled therapeutic intervention in order to improve the following deficits and impairments:  Decreased strength, Increased edema, Postural dysfunction, Decreased scar mobility, Pain, Increased muscle spasms, Impaired UE functional use, Decreased range of motion  Visit Diagnosis: Acute pain of left shoulder - Plan: PT plan of care cert/re-cert  Stiffness of left shoulder, not elsewhere classified - Plan: PT plan of care cert/re-cert  Localized swelling, mass and lump, left upper limb - Plan: PT plan of care cert/re-cert      G-Codes - 62/95/28 1149    Functional Assessment Tool Used (Outpatient Only) foto 77% limitation   Functional Limitation Carrying, moving and handling objects   Carrying, Moving and Handling Objects Current Status (U1324) At least 60 percent but less than 80 percent impaired, limited or restricted   Carrying, Moving and Handling Objects Goal Status (M0102) At least 40 percent but less than 60 percent impaired, limited or restricted       Problem List Patient Active Problem List   Diagnosis Date Noted  . 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 Gastric AVM   . Thrombocytopenia ([place]) 05/24/2016  . Malignant melanoma ([place]) 01/08/2016  . Acute esophagitis   . Dysphagia   . Anastomotic stricture of stomach   . Esophageal stricture   . 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 Gastric cancer ([place]) 06/08/2015  . B12 deficiency 06/05/2015  . Prostate cancer ([place]) 10/24/2014  . Unspecified vitamin D deficiency 06/03/2014  . History of transfusion 12/16/2013  . Basal cell cancer  09/17/2012  . HEARING DEFICIT 12/21/2009  . NONSPECIFIC ABNORMAL ELECTROCARDIOGRAM 12/21/2009  . History of colonic polyps 12/04/2008  . HYPERLIPIDEMIA 08/23/2007    Sumner Boast., PT 05/27/2017, 11:52 AM  [address] [place], [place], [address] Phone: [phone]   Fax:  [phone]  Name: James [name] MRN: [id number] Date of Birth: [birth year]

## 2017-06-02 ENCOUNTER — Ambulatory Visit: Payer: Medicare Other | Admitting: Physical Therapy

## 2017-06-02 ENCOUNTER — Encounter: Payer: Self-pay | Admitting: Physical Therapy

## 2017-06-02 DIAGNOSIS — M25512 Pain in left shoulder: Secondary | ICD-10-CM | POA: Diagnosis not present

## 2017-06-02 DIAGNOSIS — M6281 Muscle weakness (generalized): Secondary | ICD-10-CM

## 2017-06-02 DIAGNOSIS — R2232 Localized swelling, mass and lump, left upper limb: Secondary | ICD-10-CM | POA: Diagnosis not present

## 2017-06-02 DIAGNOSIS — M25612 Stiffness of left shoulder, not elsewhere classified: Secondary | ICD-10-CM | POA: Diagnosis not present

## 2017-06-02 DIAGNOSIS — R262 Difficulty in walking, not elsewhere classified: Secondary | ICD-10-CM | POA: Diagnosis not present

## 2017-06-02 NOTE — Therapy (Signed)
[place], [place], [address] Phone: [phone]   Fax:  [phone]  Physical Therapy Treatment  Patient Details  Name: James [name] MRN: [id number] Date of Birth: 1946/07/27 Referring Provider: Onnie Graham  Encounter Date: 06/02/2017      PT End of Session - 06/02/17 1051    Visit Number 2   Date for PT Re-Evaluation 07/27/17   PT Start Time 8250   PT Stop Time 1101   PT Time Calculation (min) 46 min   Activity Tolerance Patient tolerated treatment well   Behavior During Therapy [facility] for tasks assessed/performed      Past Medical History:  Diagnosis Date  . Abnormal EKG    NS ST-T EKG Changes, (-) Nuclear Stress Test 03/2006  . Allergy   . Anemia    iron deficency  . Arthritis   . B12 deficiency 06/05/2015   B12 Low NL at 271 methtylmalonic acid high  . Cardiac murmur    nothing to be concerned with   . Cataract    extractions  . Duodenal ulcer 1972   transfusion required, 1 unit packed cells  . Dysphagia    history throat/stomach  cancer "can swallow some, uses more pureed type foods"  . Dyspnea    at times  . Esophageal reflux    no currlently  . Gastric cancer ([place]) 07/18/15   invasive adenocarcinoma  . Gastrostomy tube in place [facility])    intermittent feeding  . Hx of basal cell carcinoma   . Hyperlipemia    no per pt 04-11-16  . Melanoma ([place])   . Prostate cancer ([place]) 09/06/14   last radiation 4'16   . S/P radiation therapy 11/29/2014 through 01/23/2015    Prostate 7800 cGy in 40 sessions, seminal vesicles 5600 cGy in 40 sessions   . Skin cancer, basal cell   . Transfusion history    '72 -s/p surgery for duodenal ulcer, 12/17    Past Surgical History:  Procedure Laterality Date  . BALLOON DILATION N/A 09/12/2015   Procedure: BALLOON DILATION;  Surgeon: Gatha Mayer, MD;  [facility];   Service: Endoscopy;  Laterality: N/A;  . BALLOON DILATION N/A 09/21/2015   Procedure: BALLOON DILATION;  Surgeon: Gatha Mayer, MD;  [facility];  Service: Endoscopy;  Laterality: N/A;  . BALLOON DILATION N/A 10/04/2015   Procedure: BALLOON DILATION;  Surgeon: Milus Banister, MD;  [facility];  Service: Endoscopy;  Laterality: N/A;  . BALLOON DILATION N/A 12/10/2015   Procedure: BALLOON DILATION;  Surgeon: Gatha Mayer, MD;  [facility];  Service: Endoscopy;  Laterality: N/A;  . BALLOON DILATION N/A 10/10/2016   Procedure: BALLOON DILATION;  Surgeon: Gatha Mayer, MD;  [facility];  Service: Endoscopy;  Laterality: N/A;  . CATARACT EXTRACTION, BILATERAL Bilateral   . COLONOSCOPY    . colonoscopy with polypectomy  02/2012   Dr Sharlett Iles  . ESOPHAGOGASTRODUODENOSCOPY (EGD) WITH PROPOFOL N/A 08/17/2015   Procedure: ESOPHAGOGASTRODUODENOSCOPY (EGD) WITH PROPOFOL;  Surgeon: Gatha Mayer, MD;  Location: [place];  Service: Endoscopy;  Laterality: N/A;  . ESOPHAGOGASTRODUODENOSCOPY (EGD) WITH PROPOFOL N/A 09/12/2015   Procedure: ESOPHAGOGASTRODUODENOSCOPY (EGD) WITH PROPOFOL;  Surgeon: Gatha Mayer, MD;  [facility];  Service: Endoscopy;  Laterality: N/A;  . ESOPHAGOGASTRODUODENOSCOPY (EGD) WITH PROPOFOL N/A 09/21/2015   Procedure: ESOPHAGOGASTRODUODENOSCOPY (EGD) WITH PROPOFOL;  Surgeon: Gatha Mayer, MD;  [facility];  Service: Endoscopy;  Laterality: N/A;  . ESOPHAGOGASTRODUODENOSCOPY (EGD) WITH PROPOFOL N/A 10/04/2015   Procedure: ESOPHAGOGASTRODUODENOSCOPY (EGD) WITH PROPOFOL;  Surgeon: Milus Banister, MD;  [facility];  Service: Endoscopy;  Laterality: N/A;  possible stricture  . ESOPHAGOGASTRODUODENOSCOPY (EGD) WITH PROPOFOL N/A 12/10/2015   Procedure: ESOPHAGOGASTRODUODENOSCOPY (EGD) WITH PROPOFOL;  Surgeon: Gatha Mayer, MD;  [facility];  Service: Endoscopy;  Laterality: N/A;  .  ESOPHAGOGASTRODUODENOSCOPY (EGD) WITH PROPOFOL N/A 02/06/2016   Procedure: ESOPHAGOGASTRODUODENOSCOPY (EGD) WITH PROPOFOL;  Surgeon: Gatha Mayer, MD;  [facility];  Service: Endoscopy;  Laterality: N/A;  . ESOPHAGOGASTRODUODENOSCOPY (EGD) WITH PROPOFOL N/A 10/10/2016   Procedure: ESOPHAGOGASTRODUODENOSCOPY (EGD) WITH PROPOFOL;  Surgeon: Gatha Mayer, MD;  [facility];  Service: Endoscopy;  Laterality: N/A;  needs APC   . EUS N/A 06/14/2015   Procedure: ESOPHAGEAL ENDOSCOPIC ULTRASOUND (EUS) RADIAL;  Surgeon: Milus Banister, MD;  [facility];  Service: Endoscopy;  Laterality: N/A;  . HOT HEMOSTASIS N/A 10/10/2016   Procedure: HOT HEMOSTASIS (ARGON PLASMA COAGULATION/BICAP);  Surgeon: Gatha Mayer, MD;  [facility];  Service: Endoscopy;  Laterality: N/A;  . KNEE ARTHROSCOPY Right    Right Knee, [place] [facility]  . LAPAROSCOPIC GASTRECTOMY N/A 07/18/2015   Procedure: SUB TOTAL GASTRECTOMY;  Surgeon: Stark Klein, MD;  [facility];  Service: General;  Laterality: N/A;  . LAPAROSCOPIC GASTROSTOMY N/A 07/18/2015   Procedure: FEEDING TUBE PLACEMENT;  Surgeon: Stark Klein, MD;  [facility];  Service: General;  Laterality: N/A;  . LAPAROSCOPY N/A 07/18/2015   Procedure: LAPAROSCOPY DIAGNOSTIC;  Surgeon: Stark Klein, MD;  [facility];  Service: General;  Laterality: N/A;  . MELANOMA EXCISION  02-01-16   abdominal wall  . PROSTATE BIOPSY  09/06/14  . ROTATOR CUFF REPAIR Right    R shoulder, [place]  . SAVORY DILATION N/A 02/06/2016   Procedure: SAVORY DILATION;  Surgeon: Gatha Mayer, MD;  [facility];  Service: Endoscopy;  Laterality: N/A;  . TOTAL SHOULDER ARTHROPLASTY Left 05/07/2017   Procedure: LEFT REVERSE TOTAL SHOULDER ARTHROPLASTY;  Surgeon: Justice Britain, MD;  Location: [place];  Service: Orthopedics;  Laterality: Left;  . TRIGGER FINGER RELEASE Left 1998  . UPPER GASTROINTESTINAL ENDOSCOPY     x14  . VASECTOMY      There were  no vitals filed for this visit.      Subjective Assessment - 06/02/17 1017    Subjective "We are doing ok, Doing my exercises 3 times a day" Pt reports tylenol and ice helps   Currently in Pain? No/denies   Pain Score 0-No pain                         [facility] Adult PT Treatment/Exercise - 06/02/17 0001      Exercises   Exercises Shoulder     Shoulder Exercises: Supine   External Rotation Left;AAROM;10 reps  x2   Flexion AAROM;10 reps  x2   ABduction 10 reps;AAROM;Left  x2     Shoulder Exercises: Isometric Strengthening   External Rotation 5X5"  x2   ABduction 5X5"  x2     Modalities   Modalities Vasopneumatic     Vasopneumatic   Number Minutes Vasopneumatic  10 minutes   Vasopnuematic Location  Shoulder   Vasopneumatic Pressure Low   Vasopneumatic Temperature  33     Manual Therapy   Manual Therapy Passive ROM   Passive ROM L shoulder ER 30, abd 60, flex  90                  PT Short Term Goals - 05/27/17 1126      PT SHORT TERM GOAL #1   Title independent with initial HEP   Time 2   Period Weeks   Status New           PT Long Term Goals - 05/27/17 1126      PT LONG TERM GOAL #1   Title return to playing tennis 1x/week   Time 12   Period Weeks   Status New     PT LONG TERM GOAL #2   Title Increase UE 4/5   Time 8   Period Weeks   Status New     PT LONG TERM GOAL #3   Title increase AROM of left shoulder flexion to 110 degrees   Time 8   Period Weeks   Status New     PT LONG TERM GOAL #4   Title decrease pain 50%   Time 8   Period Weeks   Status New     PT LONG TERM GOAL #5   Title dress and do hair without difficulty   Time 8   Period Weeks   Status New               Plan - 06/02/17 1051    Clinical Impression Statement Pt tolerated an initial progression to exercises, all interventions contained to protocol limitations. He does repots a little pain with L shoulder flexion to 90.    PT  Frequency 2x / week   PT Duration 8 weeks   PT Treatment/Interventions ADLs/Self Care Home Management;Cryotherapy;Electrical Stimulation;Patient/family education;Therapeutic exercise;Therapeutic activities;Manual techniques;Vasopneumatic Device   PT Next Visit Plan follow MD protocol for reverse total shoulder      Patient will benefit from skilled therapeutic intervention in order to improve the following deficits and impairments:  Decreased strength, Increased edema, Postural dysfunction, Decreased scar mobility, Pain, Increased muscle spasms, Impaired UE functional use, Decreased range of motion  Visit Diagnosis: Acute pain of left shoulder  Stiffness of left shoulder, not elsewhere classified  Localized swelling, mass and lump, left upper limb  Muscle weakness (generalized)  Difficulty in walking, not elsewhere classified     Problem List Patient Active Problem List   Diagnosis Date Noted  . 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 Gastric AVM   . Thrombocytopenia ([place]) 05/24/2016  . Malignant melanoma ([place]) 01/08/2016  . Acute esophagitis   . Dysphagia   . Anastomotic stricture of stomach   . Esophageal stricture   . 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 Gastric cancer ([place]) 06/08/2015  . B12 deficiency 06/05/2015  . Prostate cancer ([place]) 10/24/2014  . Unspecified vitamin D deficiency 06/03/2014  . History of transfusion 12/16/2013  . Basal cell cancer 09/17/2012  . HEARING DEFICIT 12/21/2009  . NONSPECIFIC ABNORMAL ELECTROCARDIOGRAM 12/21/2009  . History of colonic polyps 12/04/2008  . HYPERLIPIDEMIA 08/23/2007    Scot Jun, PTA 06/02/2017, 10:53 AM   [place], [place], [address] Phone: [phone]   Fax:  [phone]  Name: James [name] MRN: [id number] Date of Birth: 1946/06/01

## 2017-06-04 ENCOUNTER — Encounter: Payer: Medicare Other | Admitting: Physical Therapy

## 2017-06-05 ENCOUNTER — Ambulatory Visit: Payer: Medicare Other | Admitting: Physical Therapy

## 2017-06-05 ENCOUNTER — Encounter: Payer: Self-pay | Admitting: Physical Therapy

## 2017-06-05 DIAGNOSIS — M25612 Stiffness of left shoulder, not elsewhere classified: Secondary | ICD-10-CM | POA: Diagnosis not present

## 2017-06-05 DIAGNOSIS — M25512 Pain in left shoulder: Secondary | ICD-10-CM | POA: Diagnosis not present

## 2017-06-05 DIAGNOSIS — R2232 Localized swelling, mass and lump, left upper limb: Secondary | ICD-10-CM | POA: Diagnosis not present

## 2017-06-05 DIAGNOSIS — R262 Difficulty in walking, not elsewhere classified: Secondary | ICD-10-CM | POA: Diagnosis not present

## 2017-06-05 DIAGNOSIS — M6281 Muscle weakness (generalized): Secondary | ICD-10-CM | POA: Diagnosis not present

## 2017-06-05 NOTE — Therapy (Signed)
[place], [place], [address] Phone: [phone]   Fax:  [phone]  Physical Therapy Treatment  Patient Details  Name: James [name] MRN: [id number] Date of Birth: 01-26-1946 Referring Provider: Onnie Graham  Encounter Date: 06/05/2017      PT End of Session - 06/05/17 0828    Visit Number 3   Date for PT Re-Evaluation 07/27/17   PT Start Time 0756   PT Stop Time 0846   PT Time Calculation (min) 50 min   Activity Tolerance Patient tolerated treatment well   Behavior During Therapy [facility] for tasks assessed/performed      Past Medical History:  Diagnosis Date  . Abnormal EKG    NS ST-T EKG Changes, (-) Nuclear Stress Test 03/2006  . Allergy   . Anemia    iron deficency  . Arthritis   . B12 deficiency 06/05/2015   B12 Low NL at 271 methtylmalonic acid high  . Cardiac murmur    nothing to be concerned with   . Cataract    extractions  . Duodenal ulcer 1972   transfusion required, 1 unit packed cells  . Dysphagia    history throat/stomach  cancer "can swallow some, uses more pureed type foods"  . Dyspnea    at times  . Esophageal reflux    no currlently  . Gastric cancer ([place]) 07/18/15   invasive adenocarcinoma  . Gastrostomy tube in place [facility])    intermittent feeding  . Hx of basal cell carcinoma   . Hyperlipemia    no per pt 04-11-16  . Melanoma ([place])   . Prostate cancer ([place]) 09/06/14   last radiation 4'16   . S/P radiation therapy 11/29/2014 through 01/23/2015    Prostate 7800 cGy in 40 sessions, seminal vesicles 5600 cGy in 40 sessions   . Skin cancer, basal cell   . Transfusion history    '72 -s/p surgery for duodenal ulcer, 12/17    Past Surgical History:  Procedure Laterality Date  . BALLOON DILATION N/A 09/12/2015   Procedure: BALLOON DILATION;  Surgeon: Gatha Mayer, MD;  [facility];   Service: Endoscopy;  Laterality: N/A;  . BALLOON DILATION N/A 09/21/2015   Procedure: BALLOON DILATION;  Surgeon: Gatha Mayer, MD;  [facility];  Service: Endoscopy;  Laterality: N/A;  . BALLOON DILATION N/A 10/04/2015   Procedure: BALLOON DILATION;  Surgeon: Milus Banister, MD;  [facility];  Service: Endoscopy;  Laterality: N/A;  . BALLOON DILATION N/A 12/10/2015   Procedure: BALLOON DILATION;  Surgeon: Gatha Mayer, MD;  [facility];  Service: Endoscopy;  Laterality: N/A;  . BALLOON DILATION N/A 10/10/2016   Procedure: BALLOON DILATION;  Surgeon: Gatha Mayer, MD;  [facility];  Service: Endoscopy;  Laterality: N/A;  . CATARACT EXTRACTION, BILATERAL Bilateral   . COLONOSCOPY    . colonoscopy with polypectomy  02/2012   Dr Sharlett Iles  . ESOPHAGOGASTRODUODENOSCOPY (EGD) WITH PROPOFOL N/A 08/17/2015   Procedure: ESOPHAGOGASTRODUODENOSCOPY (EGD) WITH PROPOFOL;  Surgeon: Gatha Mayer, MD;  Location: [place];  Service: Endoscopy;  Laterality: N/A;  . ESOPHAGOGASTRODUODENOSCOPY (EGD) WITH PROPOFOL N/A 09/12/2015   Procedure: ESOPHAGOGASTRODUODENOSCOPY (EGD) WITH PROPOFOL;  Surgeon: Gatha Mayer, MD;  [facility];  Service: Endoscopy;  Laterality: N/A;  . ESOPHAGOGASTRODUODENOSCOPY (EGD) WITH PROPOFOL N/A 09/21/2015   Procedure: ESOPHAGOGASTRODUODENOSCOPY (EGD) WITH PROPOFOL;  Surgeon: Gatha Mayer, MD;  [facility];  Service: Endoscopy;  Laterality: N/A;  . ESOPHAGOGASTRODUODENOSCOPY (EGD) WITH PROPOFOL N/A 10/04/2015   Procedure: ESOPHAGOGASTRODUODENOSCOPY (EGD) WITH PROPOFOL;  Surgeon: Milus Banister, MD;  [facility];  Service: Endoscopy;  Laterality: N/A;  possible stricture  . ESOPHAGOGASTRODUODENOSCOPY (EGD) WITH PROPOFOL N/A 12/10/2015   Procedure: ESOPHAGOGASTRODUODENOSCOPY (EGD) WITH PROPOFOL;  Surgeon: Gatha Mayer, MD;  [facility];  Service: Endoscopy;  Laterality: N/A;  .  ESOPHAGOGASTRODUODENOSCOPY (EGD) WITH PROPOFOL N/A 02/06/2016   Procedure: ESOPHAGOGASTRODUODENOSCOPY (EGD) WITH PROPOFOL;  Surgeon: Gatha Mayer, MD;  [facility];  Service: Endoscopy;  Laterality: N/A;  . ESOPHAGOGASTRODUODENOSCOPY (EGD) WITH PROPOFOL N/A 10/10/2016   Procedure: ESOPHAGOGASTRODUODENOSCOPY (EGD) WITH PROPOFOL;  Surgeon: Gatha Mayer, MD;  [facility];  Service: Endoscopy;  Laterality: N/A;  needs APC   . EUS N/A 06/14/2015   Procedure: ESOPHAGEAL ENDOSCOPIC ULTRASOUND (EUS) RADIAL;  Surgeon: Milus Banister, MD;  [facility];  Service: Endoscopy;  Laterality: N/A;  . HOT HEMOSTASIS N/A 10/10/2016   Procedure: HOT HEMOSTASIS (ARGON PLASMA COAGULATION/BICAP);  Surgeon: Gatha Mayer, MD;  [facility];  Service: Endoscopy;  Laterality: N/A;  . KNEE ARTHROSCOPY Right    Right Knee, [place] [facility]  . LAPAROSCOPIC GASTRECTOMY N/A 07/18/2015   Procedure: SUB TOTAL GASTRECTOMY;  Surgeon: Stark Klein, MD;  [facility];  Service: General;  Laterality: N/A;  . LAPAROSCOPIC GASTROSTOMY N/A 07/18/2015   Procedure: FEEDING TUBE PLACEMENT;  Surgeon: Stark Klein, MD;  [facility];  Service: General;  Laterality: N/A;  . LAPAROSCOPY N/A 07/18/2015   Procedure: LAPAROSCOPY DIAGNOSTIC;  Surgeon: Stark Klein, MD;  [facility];  Service: General;  Laterality: N/A;  . MELANOMA EXCISION  02-01-16   abdominal wall  . PROSTATE BIOPSY  09/06/14  . ROTATOR CUFF REPAIR Right    R shoulder, [place]  . SAVORY DILATION N/A 02/06/2016   Procedure: SAVORY DILATION;  Surgeon: Gatha Mayer, MD;  [facility];  Service: Endoscopy;  Laterality: N/A;  . TOTAL SHOULDER ARTHROPLASTY Left 05/07/2017   Procedure: LEFT REVERSE TOTAL SHOULDER ARTHROPLASTY;  Surgeon: Justice Britain, MD;  Location: [place];  Service: Orthopedics;  Laterality: Left;  . TRIGGER FINGER RELEASE Left 1998  . UPPER GASTROINTESTINAL ENDOSCOPY     x14  . VASECTOMY      There were  no vitals filed for this visit.      Subjective Assessment - 06/05/17 0759    Subjective I was pretty sore after the last treatment.   Currently in Pain? Yes   Pain Score 1    Pain Location Shoulder   Pain Orientation Left                         [facility] Adult PT Treatment/Exercise - 06/05/17 0001      Shoulder Exercises: Standing   Other Standing Exercises ball rolling on mat table   Other Standing Exercises Upper cuts and hitchhikers x 20 each     Shoulder Exercises: Pulleys   Flexion 2 minutes   Flexion Limitations 90 degrees per protocol   ABduction 2 minutes   ABduction Limitations 60 degrees per protocol   Other Pulley Exercises ER 30 degrees per protocol 2 minutes     Shoulder Exercises: ROM/Strengthening   UBE (Upper Arm Bike) level 2 x 4 minutes     Shoulder Exercises: Isometric Strengthening   External Rotation 5X5";5X10"   ABduction 5X10"     Modalities   Modalities Vasopneumatic  Vasopneumatic   Number Minutes Vasopneumatic  10 minutes   Vasopnuematic Location  Shoulder   Vasopneumatic Pressure Medium   Vasopneumatic Temperature  33     Manual Therapy   Manual Therapy Passive ROM   Passive ROM L shoulder ER 30, abd 60, flex 90                  PT Short Term Goals - 06/05/17 0831      PT SHORT TERM GOAL #1   Title independent with initial HEP   Status Achieved           PT Long Term Goals - 05/27/17 1126      PT LONG TERM GOAL #1   Title return to playing tennis 1x/week   Time 12   Period Weeks   Status New     PT LONG TERM GOAL #2   Title Increase UE 4/5   Time 8   Period Weeks   Status New     PT LONG TERM GOAL #3   Title increase AROM of left shoulder flexion to 110 degrees   Time 8   Period Weeks   Status New     PT LONG TERM GOAL #4   Title decrease pain 50%   Time 8   Period Weeks   Status New     PT LONG TERM GOAL #5   Title dress and do hair without difficulty   Time 8   Period Weeks    Status New               Plan - 06/05/17 0829    Clinical Impression Statement Patient at the protocol limit ranges for his time frame, today is 4 weeks post op, next week we can advance to the 4-8 week range of protocol   PT Next Visit Plan next visit advance to week4-8 rand of protocol   Consulted and Agree with Plan of Care Patient      Patient will benefit from skilled therapeutic intervention in order to improve the following deficits and impairments:  Decreased strength, Increased edema, Postural dysfunction, Decreased scar mobility, Pain, Increased muscle spasms, Impaired UE functional use, Decreased range of motion  Visit Diagnosis: Acute pain of left shoulder  Stiffness of left shoulder, not elsewhere classified  Localized swelling, mass and lump, left upper limb     Problem List Patient Active Problem List   Diagnosis Date Noted  . 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 Gastric AVM   . Thrombocytopenia ([place]) 05/24/2016  . Malignant melanoma ([place]) 01/08/2016  . Acute esophagitis   . Dysphagia   . Anastomotic stricture of stomach   . Esophageal stricture   . 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 Gastric cancer ([place]) 06/08/2015  . B12 deficiency 06/05/2015  . Prostate cancer ([place]) 10/24/2014  . Unspecified vitamin D deficiency 06/03/2014  . History of transfusion 12/16/2013  . Basal cell cancer 09/17/2012  . HEARING DEFICIT 12/21/2009  . NONSPECIFIC ABNORMAL ELECTROCARDIOGRAM 12/21/2009  . History of colonic polyps 12/04/2008  . HYPERLIPIDEMIA 08/23/2007    Saranda Legrande W.,PT 06/05/2017, 8:32 AM  [place] [address] [place], [place], [address] Phone: [phone]   Fax:  [phone]  Name: James [name] MRN: [id number] Date of Birth: 12/27/1945

## 2017-06-09 ENCOUNTER — Ambulatory Visit: Payer: Medicare Other | Attending: Orthopedic Surgery | Admitting: Physical Therapy

## 2017-06-09 ENCOUNTER — Encounter: Payer: Self-pay | Admitting: Physical Therapy

## 2017-06-09 DIAGNOSIS — M25612 Stiffness of left shoulder, not elsewhere classified: Secondary | ICD-10-CM | POA: Diagnosis not present

## 2017-06-09 DIAGNOSIS — M25512 Pain in left shoulder: Secondary | ICD-10-CM | POA: Diagnosis not present

## 2017-06-09 DIAGNOSIS — R2232 Localized swelling, mass and lump, left upper limb: Secondary | ICD-10-CM | POA: Insufficient documentation

## 2017-06-09 NOTE — Therapy (Signed)
[place], [place], [address] Phone: [phone]   Fax:  [phone]  Physical Therapy Treatment  Patient Details  Name: James [name] MRN: [id number] Date of Birth: Dec 12, 1945 Referring Provider: Onnie Graham  Encounter Date: 06/09/2017      PT End of Session - 06/09/17 1049    Visit Number 4   Date for PT Re-Evaluation 07/27/17   PT Start Time 1013   PT Stop Time 1110   PT Time Calculation (min) 57 min   Activity Tolerance Patient tolerated treatment well   Behavior During Therapy [facility] for tasks assessed/performed      Past Medical History:  Diagnosis Date  . Abnormal EKG    NS ST-T EKG Changes, (-) Nuclear Stress Test 03/2006  . Allergy   . Anemia    iron deficency  . Arthritis   . B12 deficiency 06/05/2015   B12 Low NL at 271 methtylmalonic acid high  . Cardiac murmur    nothing to be concerned with   . Cataract    extractions  . Duodenal ulcer 1972   transfusion required, 1 unit packed cells  . Dysphagia    history throat/stomach  cancer "can swallow some, uses more pureed type foods"  . Dyspnea    at times  . Esophageal reflux    no currlently  . Gastric cancer ([place]) 07/18/15   invasive adenocarcinoma  . Gastrostomy tube in place [facility])    intermittent feeding  . Hx of basal cell carcinoma   . Hyperlipemia    no per pt 04-11-16  . Melanoma ([place])   . Prostate cancer ([place]) 09/06/14   last radiation 4'16   . S/P radiation therapy 11/29/2014 through 01/23/2015    Prostate 7800 cGy in 40 sessions, seminal vesicles 5600 cGy in 40 sessions   . Skin cancer, basal cell   . Transfusion history    '72 -s/p surgery for duodenal ulcer, 12/17    Past Surgical History:  Procedure Laterality Date  . BALLOON DILATION N/A 09/12/2015   Procedure: BALLOON DILATION;  Surgeon: Gatha Mayer, MD;  [facility];   Service: Endoscopy;  Laterality: N/A;  . BALLOON DILATION N/A 09/21/2015   Procedure: BALLOON DILATION;  Surgeon: Gatha Mayer, MD;  [facility];  Service: Endoscopy;  Laterality: N/A;  . BALLOON DILATION N/A 10/04/2015   Procedure: BALLOON DILATION;  Surgeon: Milus Banister, MD;  [facility];  Service: Endoscopy;  Laterality: N/A;  . BALLOON DILATION N/A 12/10/2015   Procedure: BALLOON DILATION;  Surgeon: Gatha Mayer, MD;  [facility];  Service: Endoscopy;  Laterality: N/A;  . BALLOON DILATION N/A 10/10/2016   Procedure: BALLOON DILATION;  Surgeon: Gatha Mayer, MD;  [facility];  Service: Endoscopy;  Laterality: N/A;  . CATARACT EXTRACTION, BILATERAL Bilateral   . COLONOSCOPY    . colonoscopy with polypectomy  02/2012   Dr Sharlett Iles  . ESOPHAGOGASTRODUODENOSCOPY (EGD) WITH PROPOFOL N/A 08/17/2015   Procedure: ESOPHAGOGASTRODUODENOSCOPY (EGD) WITH PROPOFOL;  Surgeon: Gatha Mayer, MD;  Location: [place];  Service: Endoscopy;  Laterality: N/A;  . ESOPHAGOGASTRODUODENOSCOPY (EGD) WITH PROPOFOL N/A 09/12/2015   Procedure: ESOPHAGOGASTRODUODENOSCOPY (EGD) WITH PROPOFOL;  Surgeon: Gatha Mayer, MD;  [facility];  Service: Endoscopy;  Laterality: N/A;  . ESOPHAGOGASTRODUODENOSCOPY (EGD) WITH PROPOFOL N/A 09/21/2015   Procedure: ESOPHAGOGASTRODUODENOSCOPY (EGD) WITH PROPOFOL;  Surgeon: Gatha Mayer, MD;  [facility];  Service: Endoscopy;  Laterality: N/A;  . ESOPHAGOGASTRODUODENOSCOPY (EGD) WITH PROPOFOL N/A 10/04/2015   Procedure: ESOPHAGOGASTRODUODENOSCOPY (EGD) WITH PROPOFOL;  Surgeon: Milus Banister, MD;  [facility];  Service: Endoscopy;  Laterality: N/A;  possible stricture  . ESOPHAGOGASTRODUODENOSCOPY (EGD) WITH PROPOFOL N/A 12/10/2015   Procedure: ESOPHAGOGASTRODUODENOSCOPY (EGD) WITH PROPOFOL;  Surgeon: Gatha Mayer, MD;  [facility];  Service: Endoscopy;  Laterality: N/A;  .  ESOPHAGOGASTRODUODENOSCOPY (EGD) WITH PROPOFOL N/A 02/06/2016   Procedure: ESOPHAGOGASTRODUODENOSCOPY (EGD) WITH PROPOFOL;  Surgeon: Gatha Mayer, MD;  [facility];  Service: Endoscopy;  Laterality: N/A;  . ESOPHAGOGASTRODUODENOSCOPY (EGD) WITH PROPOFOL N/A 10/10/2016   Procedure: ESOPHAGOGASTRODUODENOSCOPY (EGD) WITH PROPOFOL;  Surgeon: Gatha Mayer, MD;  [facility];  Service: Endoscopy;  Laterality: N/A;  needs APC   . EUS N/A 06/14/2015   Procedure: ESOPHAGEAL ENDOSCOPIC ULTRASOUND (EUS) RADIAL;  Surgeon: Milus Banister, MD;  [facility];  Service: Endoscopy;  Laterality: N/A;  . HOT HEMOSTASIS N/A 10/10/2016   Procedure: HOT HEMOSTASIS (ARGON PLASMA COAGULATION/BICAP);  Surgeon: Gatha Mayer, MD;  [facility];  Service: Endoscopy;  Laterality: N/A;  . KNEE ARTHROSCOPY Right    Right Knee, [place] [facility]  . LAPAROSCOPIC GASTRECTOMY N/A 07/18/2015   Procedure: SUB TOTAL GASTRECTOMY;  Surgeon: Stark Klein, MD;  [facility];  Service: General;  Laterality: N/A;  . LAPAROSCOPIC GASTROSTOMY N/A 07/18/2015   Procedure: FEEDING TUBE PLACEMENT;  Surgeon: Stark Klein, MD;  [facility];  Service: General;  Laterality: N/A;  . LAPAROSCOPY N/A 07/18/2015   Procedure: LAPAROSCOPY DIAGNOSTIC;  Surgeon: Stark Klein, MD;  [facility];  Service: General;  Laterality: N/A;  . MELANOMA EXCISION  02-01-16   abdominal wall  . PROSTATE BIOPSY  09/06/14  . ROTATOR CUFF REPAIR Right    R shoulder, [place]  . SAVORY DILATION N/A 02/06/2016   Procedure: SAVORY DILATION;  Surgeon: Gatha Mayer, MD;  [facility];  Service: Endoscopy;  Laterality: N/A;  . TOTAL SHOULDER ARTHROPLASTY Left 05/07/2017   Procedure: LEFT REVERSE TOTAL SHOULDER ARTHROPLASTY;  Surgeon: Justice Britain, MD;  Location: [place];  Service: Orthopedics;  Laterality: Left;  . TRIGGER FINGER RELEASE Left 1998  . UPPER GASTROINTESTINAL ENDOSCOPY     x14  . VASECTOMY      There were  no vitals filed for this visit.      Subjective Assessment - 06/09/17 1015    Subjective I have stopped using the sling.  Feeling pretty good, walking about 3 miles a day now   Currently in Pain? No/denies                         [facility] Adult PT Treatment/Exercise - 06/09/17 0001      Shoulder Exercises: Standing   Other Standing Exercises ball rolling on mat table   Other Standing Exercises Upper cuts and hitchhikers x 20 each     Shoulder Exercises: Pulleys   Flexion 2 minutes   Flexion Limitations 90 degrees per protocol   ABduction 2 minutes   ABduction Limitations 60 degrees per protocol   Other Pulley Exercises ER 30 degrees per protocol 2 minutes     Shoulder Exercises: ROM/Strengthening   UBE (Upper Arm Bike) level 2 x 6 minutes   Other ROM/Strengthening Exercises 15# triceps, 2# biceps     Shoulder Exercises: Isometric Strengthening   External Rotation 5X10"   ABduction 5X10"     Vasopneumatic   Number Minutes  Vasopneumatic  15 minutes   Vasopnuematic Location  Shoulder   Vasopneumatic Pressure Medium   Vasopneumatic Temperature  33     Manual Therapy   Manual Therapy Passive ROM   Passive ROM L shoulder ER 30, abd 60, flex 90                PT Education - 06/09/17 1048    Education provided Yes   Education Details Isometric HEP   Person(s) Educated Patient   Methods Explanation;Demonstration;Tactile cues;Verbal cues;Handout   Comprehension Verbalized understanding;Returned demonstration          PT Short Term Goals - 06/05/17 0831      PT SHORT TERM GOAL #1   Title independent with initial HEP   Status Achieved           PT Long Term Goals - 05/27/17 1126      PT LONG TERM GOAL #1   Title return to playing tennis 1x/week   Time 12   Period Weeks   Status New     PT LONG TERM GOAL #2   Title Increase UE 4/5   Time 8   Period Weeks   Status New     PT LONG TERM GOAL #3   Title increase AROM of left shoulder  flexion to 110 degrees   Time 8   Period Weeks   Status New     PT LONG TERM GOAL #4   Title decrease pain 50%   Time 8   Period Weeks   Status New     PT LONG TERM GOAL #5   Title dress and do hair without difficulty   Time 8   Period Weeks   Status New               Plan - 06/09/17 1049    Clinical Impression Statement Patient doing very well with his ROM, has stopped wearing the sling.  Bumped up the ROM limitation of the protocol today to ER 45, Flexion 115 and abduction 90 degrees   PT Next Visit Plan work in the 4-8 week protocol area   Consulted and Agree with Plan of Care Patient      Patient will benefit from skilled therapeutic intervention in order to improve the following deficits and impairments:  Decreased strength, Increased edema, Postural dysfunction, Decreased scar mobility, Pain, Increased muscle spasms, Impaired UE functional use, Decreased range of motion  Visit Diagnosis: Acute pain of left shoulder  Stiffness of left shoulder, not elsewhere classified  Localized swelling, mass and lump, left upper limb     Problem List Patient Active Problem List   Diagnosis Date Noted  . 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 Gastric AVM   . Thrombocytopenia ([place]) 05/24/2016  . Malignant melanoma ([place]) 01/08/2016  . Acute esophagitis   . Dysphagia   . Anastomotic stricture of stomach   . Esophageal stricture   . 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 Gastric cancer ([place]) 06/08/2015  . B12 deficiency 06/05/2015  . Prostate cancer ([place]) 10/24/2014  . Unspecified vitamin D deficiency 06/03/2014  . History of transfusion 12/16/2013  . Basal cell cancer 09/17/2012  . HEARING DEFICIT 12/21/2009  . NONSPECIFIC ABNORMAL ELECTROCARDIOGRAM 12/21/2009  . History of colonic polyps 12/04/2008  . HYPERLIPIDEMIA 08/23/2007    Sumner Boast., PT 06/09/2017, 10:52 AM  [place] [address] [facility] [place], [place], [address] Phone: [phone]  Fax:  [phone]  Name: James [name] MRN: [id number] Date of Birth: [birth year]

## 2017-06-11 ENCOUNTER — Ambulatory Visit: Payer: Medicare Other | Admitting: Physical Therapy

## 2017-06-11 ENCOUNTER — Encounter: Payer: Medicare Other | Admitting: Physical Therapy

## 2017-06-11 ENCOUNTER — Encounter: Payer: Self-pay | Admitting: Physical Therapy

## 2017-06-11 DIAGNOSIS — M25612 Stiffness of left shoulder, not elsewhere classified: Secondary | ICD-10-CM

## 2017-06-11 DIAGNOSIS — R2232 Localized swelling, mass and lump, left upper limb: Secondary | ICD-10-CM | POA: Diagnosis not present

## 2017-06-11 DIAGNOSIS — M25512 Pain in left shoulder: Secondary | ICD-10-CM

## 2017-06-11 NOTE — Therapy (Signed)
[place], [place], [address] Phone: [phone]   Fax:  [phone]  Physical Therapy Treatment  Patient Details  Name: James [name] MRN: [id number] Date of Birth: [birth year] Referring Provider: Onnie Graham  Encounter Date: 06/11/2017      PT End of Session - 06/11/17 1347    Visit Number 5   Date for PT Re-Evaluation 07/27/17   PT Start Time 1310   PT Stop Time 1359   PT Time Calculation (min) 49 min   Activity Tolerance Patient tolerated treatment well   Behavior During Therapy [facility] for tasks assessed/performed      Past Medical History:  Diagnosis Date  . Abnormal EKG    NS ST-T EKG Changes, (-) Nuclear Stress Test 03/2006  . Allergy   . Anemia    iron deficency  . Arthritis   . B12 deficiency 06/05/2015   B12 Low NL at 271 methtylmalonic acid high  . Cardiac murmur    nothing to be concerned with   . Cataract    extractions  . Duodenal ulcer 1972   transfusion required, 1 unit packed cells  . Dysphagia    history throat/stomach  cancer "can swallow some, uses more pureed type foods"  . Dyspnea    at times  . Esophageal reflux    no currlently  . Gastric cancer ([place]) 07/18/15   invasive adenocarcinoma  . Gastrostomy tube in place [facility])    intermittent feeding  . Hx of basal cell carcinoma   . Hyperlipemia    no per pt 04-11-16  . Melanoma ([place])   . Prostate cancer ([place]) 09/06/14   last radiation 4'16   . S/P radiation therapy 11/29/2014 through 01/23/2015    Prostate 7800 cGy in 40 sessions, seminal vesicles 5600 cGy in 40 sessions   . Skin cancer, basal cell   . Transfusion history    '72 -s/p surgery for duodenal ulcer, 12/17    Past Surgical History:  Procedure Laterality Date  . BALLOON DILATION N/A 09/12/2015   Procedure: BALLOON DILATION;  Surgeon: Gatha Mayer, MD;  [facility];   Service: Endoscopy;  Laterality: N/A;  . BALLOON DILATION N/A 09/21/2015   Procedure: BALLOON DILATION;  Surgeon: Gatha Mayer, MD;  [facility];  Service: Endoscopy;  Laterality: N/A;  . BALLOON DILATION N/A 10/04/2015   Procedure: BALLOON DILATION;  Surgeon: Milus Banister, MD;  [facility];  Service: Endoscopy;  Laterality: N/A;  . BALLOON DILATION N/A 12/10/2015   Procedure: BALLOON DILATION;  Surgeon: Gatha Mayer, MD;  [facility];  Service: Endoscopy;  Laterality: N/A;  . BALLOON DILATION N/A 10/10/2016   Procedure: BALLOON DILATION;  Surgeon: Gatha Mayer, MD;  [facility];  Service: Endoscopy;  Laterality: N/A;  . CATARACT EXTRACTION, BILATERAL Bilateral   . COLONOSCOPY    . colonoscopy with polypectomy  02/2012   Dr Sharlett Iles  . ESOPHAGOGASTRODUODENOSCOPY (EGD) WITH PROPOFOL N/A 08/17/2015   Procedure: ESOPHAGOGASTRODUODENOSCOPY (EGD) WITH PROPOFOL;  Surgeon: Gatha Mayer, MD;  Location: [place];  Service: Endoscopy;  Laterality: N/A;  . ESOPHAGOGASTRODUODENOSCOPY (EGD) WITH PROPOFOL N/A 09/12/2015   Procedure: ESOPHAGOGASTRODUODENOSCOPY (EGD) WITH PROPOFOL;  Surgeon: Gatha Mayer, MD;  [facility];  Service: Endoscopy;  Laterality: N/A;  . ESOPHAGOGASTRODUODENOSCOPY (EGD) WITH PROPOFOL N/A 09/21/2015   Procedure: ESOPHAGOGASTRODUODENOSCOPY (EGD) WITH PROPOFOL;  Surgeon: Gatha Mayer, MD;  [facility];  Service: Endoscopy;  Laterality: N/A;  . ESOPHAGOGASTRODUODENOSCOPY (EGD) WITH PROPOFOL N/A 10/04/2015   Procedure: ESOPHAGOGASTRODUODENOSCOPY (EGD) WITH PROPOFOL;  Surgeon: Milus Banister, MD;  [facility];  Service: Endoscopy;  Laterality: N/A;  possible stricture  . ESOPHAGOGASTRODUODENOSCOPY (EGD) WITH PROPOFOL N/A 12/10/2015   Procedure: ESOPHAGOGASTRODUODENOSCOPY (EGD) WITH PROPOFOL;  Surgeon: Gatha Mayer, MD;  [facility];  Service: Endoscopy;  Laterality: N/A;  .  ESOPHAGOGASTRODUODENOSCOPY (EGD) WITH PROPOFOL N/A 02/06/2016   Procedure: ESOPHAGOGASTRODUODENOSCOPY (EGD) WITH PROPOFOL;  Surgeon: Gatha Mayer, MD;  [facility];  Service: Endoscopy;  Laterality: N/A;  . ESOPHAGOGASTRODUODENOSCOPY (EGD) WITH PROPOFOL N/A 10/10/2016   Procedure: ESOPHAGOGASTRODUODENOSCOPY (EGD) WITH PROPOFOL;  Surgeon: Gatha Mayer, MD;  [facility];  Service: Endoscopy;  Laterality: N/A;  needs APC   . EUS N/A 06/14/2015   Procedure: ESOPHAGEAL ENDOSCOPIC ULTRASOUND (EUS) RADIAL;  Surgeon: Milus Banister, MD;  [facility];  Service: Endoscopy;  Laterality: N/A;  . HOT HEMOSTASIS N/A 10/10/2016   Procedure: HOT HEMOSTASIS (ARGON PLASMA COAGULATION/BICAP);  Surgeon: Gatha Mayer, MD;  [facility];  Service: Endoscopy;  Laterality: N/A;  . KNEE ARTHROSCOPY Right    Right Knee, [place] [facility]  . LAPAROSCOPIC GASTRECTOMY N/A 07/18/2015   Procedure: SUB TOTAL GASTRECTOMY;  Surgeon: Stark Klein, MD;  [facility];  Service: General;  Laterality: N/A;  . LAPAROSCOPIC GASTROSTOMY N/A 07/18/2015   Procedure: FEEDING TUBE PLACEMENT;  Surgeon: Stark Klein, MD;  [facility];  Service: General;  Laterality: N/A;  . LAPAROSCOPY N/A 07/18/2015   Procedure: LAPAROSCOPY DIAGNOSTIC;  Surgeon: Stark Klein, MD;  [facility];  Service: General;  Laterality: N/A;  . MELANOMA EXCISION  02-01-16   abdominal wall  . PROSTATE BIOPSY  09/06/14  . ROTATOR CUFF REPAIR Right    R shoulder, [place]  . SAVORY DILATION N/A 02/06/2016   Procedure: SAVORY DILATION;  Surgeon: Gatha Mayer, MD;  [facility];  Service: Endoscopy;  Laterality: N/A;  . TOTAL SHOULDER ARTHROPLASTY Left 05/07/2017   Procedure: LEFT REVERSE TOTAL SHOULDER ARTHROPLASTY;  Surgeon: Justice Britain, MD;  Location: [place];  Service: Orthopedics;  Laterality: Left;  . TRIGGER FINGER RELEASE Left 1998  . UPPER GASTROINTESTINAL ENDOSCOPY     x14  . VASECTOMY      There were  no vitals filed for this visit.      Subjective Assessment - 06/11/17 1311    Subjective I am a little stiff right now, I just walked 3 miles.   Currently in Pain? No/denies            [facility] PT Assessment - 06/11/17 0001      PROM   Left Shoulder Flexion 100 Degrees   Left Shoulder ABduction 90 Degrees   Left Shoulder Internal Rotation 34 Degrees   Left Shoulder External Rotation 40 Degrees                     [facility] Adult PT Treatment/Exercise - 06/11/17 0001      Shoulder Exercises: Supine   External Rotation Left;AAROM;10 reps   Flexion AAROM;10 reps   ABduction 10 reps;AAROM;Left     Shoulder Exercises: Standing   Extension 20 reps;Theraband   Theraband Level (Shoulder Extension) Level 2 (Red)   Row 20 reps;Theraband   Theraband Level (Shoulder Row) Level 2 (Red)   Other Standing Exercises ball rolling on mat table   Other Standing Exercises Upper cuts and hitchhikers x 20 each, wand exercises for  flexion, ER and abduction, 1# bicep curls, 1# shrugs     Shoulder Exercises: ROM/Strengthening   UBE (Upper Arm Bike) level 3 x 6 minutes     Vasopneumatic   Number Minutes Vasopneumatic  15 minutes   Vasopnuematic Location  Shoulder   Vasopneumatic Pressure Medium   Vasopneumatic Temperature  33     Manual Therapy   Manual Therapy Passive ROM   Passive ROM left shoulder ER 45, abduction 90, and flexion to 115 degree limits per protocol                  PT Short Term Goals - 06/05/17 0831      PT SHORT TERM GOAL #1   Title independent with initial HEP   Status Achieved           PT Long Term Goals - 06/11/17 1348      PT LONG TERM GOAL #4   Title decrease pain 50%   Status Partially Met     PT LONG TERM GOAL #5   Title dress and do hair without difficulty   Status Partially Met               Plan - 06/11/17 1347    Clinical Impression Statement Still doing well, starting to push the ROM gently, we are at the protocl  limits for ER and abduction, he is tight with flexion, passively to 100 degrees versus 115 degrees allowed per protocol   PT Next Visit Plan work in the 4-8 week protocol area   Consulted and Agree with Plan of Care Patient      Patient will benefit from skilled therapeutic intervention in order to improve the following deficits and impairments:  Decreased strength, Increased edema, Postural dysfunction, Decreased scar mobility, Pain, Increased muscle spasms, Impaired UE functional use, Decreased range of motion  Visit Diagnosis: Acute pain of left shoulder  Stiffness of left shoulder, not elsewhere classified  Localized swelling, mass and lump, left upper limb     Problem List Patient Active Problem List   Diagnosis Date Noted  . 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 Gastric AVM   . Thrombocytopenia ([place]) 05/24/2016  . Malignant melanoma ([place]) 01/08/2016  . Acute esophagitis   . Dysphagia   . Anastomotic stricture of stomach   . Esophageal stricture   . 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 Gastric cancer ([place]) 06/08/2015  . B12 deficiency 06/05/2015  . Prostate cancer ([place]) 10/24/2014  . Unspecified vitamin D deficiency 06/03/2014  . History of transfusion 12/16/2013  . Basal cell cancer 09/17/2012  . HEARING DEFICIT 12/21/2009  . NONSPECIFIC ABNORMAL ELECTROCARDIOGRAM 12/21/2009  . History of colonic polyps 12/04/2008  . HYPERLIPIDEMIA 08/23/2007    Sumner Boast., PT 06/11/2017, 1:49 PM  [place] [address] [place], [place], [address] Phone: [phone]   Fax:  [phone]  Name: VIREN LEBEAU MRN: [id number] Date of Birth: 1945/12/04

## 2017-06-12 ENCOUNTER — Telehealth: Payer: Self-pay | Admitting: Internal Medicine

## 2017-06-12 NOTE — Telephone Encounter (Signed)
Left message re: EGD and ablation of gastric AVM's  Dr. Barry Dienes suggested he may need this  Gave him my cell # to call me back

## 2017-06-15 NOTE — Telephone Encounter (Signed)
Patient left me a message. His hemoglobin is improving and he and I both think that this is a good thing and that repeat EGD unlikely to be needed. He is going to see his hematologist oncologist soon and he will let me know if anything changes otherwise would anticipate a routine colonoscopy only.

## 2017-06-16 ENCOUNTER — Ambulatory Visit: Payer: Medicare Other | Admitting: Physical Therapy

## 2017-06-19 ENCOUNTER — Encounter: Payer: Self-pay | Admitting: Physical Therapy

## 2017-06-19 ENCOUNTER — Ambulatory Visit: Payer: Medicare Other | Admitting: Physical Therapy

## 2017-06-19 DIAGNOSIS — M25612 Stiffness of left shoulder, not elsewhere classified: Secondary | ICD-10-CM

## 2017-06-19 DIAGNOSIS — Z96612 Presence of left artificial shoulder joint: Secondary | ICD-10-CM | POA: Diagnosis not present

## 2017-06-19 DIAGNOSIS — M25512 Pain in left shoulder: Secondary | ICD-10-CM | POA: Diagnosis not present

## 2017-06-19 DIAGNOSIS — R2232 Localized swelling, mass and lump, left upper limb: Secondary | ICD-10-CM

## 2017-06-19 DIAGNOSIS — Z471 Aftercare following joint replacement surgery: Secondary | ICD-10-CM | POA: Diagnosis not present

## 2017-06-19 NOTE — Therapy (Signed)
[place] [place], [place], [address] Phone: [phone]   Fax:  [phone]  Physical Therapy Treatment  Patient Details  Name: James [name] MRN: [id number] Date of Birth: [birth year] Referring Provider: Onnie Graham  Encounter Date: 06/19/2017      PT End of Session - 06/19/17 0929    Visit Number 6   Date for PT Re-Evaluation 07/27/17   PT Start Time 0801   PT Stop Time 0858   PT Time Calculation (min) 57 min   Activity Tolerance Patient tolerated treatment well   Behavior During Therapy [facility] for tasks assessed/performed      Past Medical History:  Diagnosis Date  . Abnormal EKG    NS ST-T EKG Changes, (-) Nuclear Stress Test 03/2006  . Allergy   . Anemia    iron deficency  . Arthritis   . B12 deficiency 06/05/2015   B12 Low NL at 271 methtylmalonic acid high  . Cardiac murmur    nothing to be concerned with   . Cataract    extractions  . Duodenal ulcer 1972   transfusion required, 1 unit packed cells  . Dysphagia    history throat/stomach  cancer "can swallow some, uses more pureed type foods"  . Dyspnea    at times  . Esophageal reflux    no currlently  . Gastric cancer ([place]) 07/18/15   invasive adenocarcinoma  . Gastrostomy tube in place [facility])    intermittent feeding  . Hx of basal cell carcinoma   . Hyperlipemia    no per pt 04-11-16  . Melanoma ([place])   . Prostate cancer ([place]) 09/06/14   last radiation 4'16   . S/P radiation therapy 11/29/2014 through 01/23/2015    Prostate 7800 cGy in 40 sessions, seminal vesicles 5600 cGy in 40 sessions   . Skin cancer, basal cell   . Transfusion history    '72 -s/p surgery for duodenal ulcer, 12/17    Past Surgical History:  Procedure Laterality Date  . BALLOON DILATION N/A 09/12/2015   Procedure: BALLOON DILATION;  Surgeon: Gatha Mayer, MD;  [facility];   Service: Endoscopy;  Laterality: N/A;  . BALLOON DILATION N/A 09/21/2015   Procedure: BALLOON DILATION;  Surgeon: Gatha Mayer, MD;  [facility];  Service: Endoscopy;  Laterality: N/A;  . BALLOON DILATION N/A 10/04/2015   Procedure: BALLOON DILATION;  Surgeon: Milus Banister, MD;  [facility];  Service: Endoscopy;  Laterality: N/A;  . BALLOON DILATION N/A 12/10/2015   Procedure: BALLOON DILATION;  Surgeon: Gatha Mayer, MD;  [facility];  Service: Endoscopy;  Laterality: N/A;  . BALLOON DILATION N/A 10/10/2016   Procedure: BALLOON DILATION;  Surgeon: Gatha Mayer, MD;  [facility];  Service: Endoscopy;  Laterality: N/A;  . CATARACT EXTRACTION, BILATERAL Bilateral   . COLONOSCOPY    . colonoscopy with polypectomy  02/2012   Dr Sharlett Iles  . ESOPHAGOGASTRODUODENOSCOPY (EGD) WITH PROPOFOL N/A 08/17/2015   Procedure: ESOPHAGOGASTRODUODENOSCOPY (EGD) WITH PROPOFOL;  Surgeon: Gatha Mayer, MD;  Location: [place];  Service: Endoscopy;  Laterality: N/A;  . ESOPHAGOGASTRODUODENOSCOPY (EGD) WITH PROPOFOL N/A 09/12/2015   Procedure: ESOPHAGOGASTRODUODENOSCOPY (EGD) WITH PROPOFOL;  Surgeon: Gatha Mayer, MD;  [facility];  Service: Endoscopy;  Laterality: N/A;  . ESOPHAGOGASTRODUODENOSCOPY (EGD) WITH PROPOFOL N/A 09/21/2015   Procedure: ESOPHAGOGASTRODUODENOSCOPY (EGD) WITH PROPOFOL;  Surgeon: Gatha Mayer, MD;  [facility];  Service: Endoscopy;  Laterality: N/A;  . ESOPHAGOGASTRODUODENOSCOPY (EGD) WITH PROPOFOL N/A 10/04/2015   Procedure: ESOPHAGOGASTRODUODENOSCOPY (EGD) WITH PROPOFOL;  Surgeon: Milus Banister, MD;  [facility];  Service: Endoscopy;  Laterality: N/A;  possible stricture  . ESOPHAGOGASTRODUODENOSCOPY (EGD) WITH PROPOFOL N/A 12/10/2015   Procedure: ESOPHAGOGASTRODUODENOSCOPY (EGD) WITH PROPOFOL;  Surgeon: Gatha Mayer, MD;  [facility];  Service: Endoscopy;  Laterality: N/A;  .  ESOPHAGOGASTRODUODENOSCOPY (EGD) WITH PROPOFOL N/A 02/06/2016   Procedure: ESOPHAGOGASTRODUODENOSCOPY (EGD) WITH PROPOFOL;  Surgeon: Gatha Mayer, MD;  [facility];  Service: Endoscopy;  Laterality: N/A;  . ESOPHAGOGASTRODUODENOSCOPY (EGD) WITH PROPOFOL N/A 10/10/2016   Procedure: ESOPHAGOGASTRODUODENOSCOPY (EGD) WITH PROPOFOL;  Surgeon: Gatha Mayer, MD;  [facility];  Service: Endoscopy;  Laterality: N/A;  needs APC   . EUS N/A 06/14/2015   Procedure: ESOPHAGEAL ENDOSCOPIC ULTRASOUND (EUS) RADIAL;  Surgeon: Milus Banister, MD;  [facility];  Service: Endoscopy;  Laterality: N/A;  . HOT HEMOSTASIS N/A 10/10/2016   Procedure: HOT HEMOSTASIS (ARGON PLASMA COAGULATION/BICAP);  Surgeon: Gatha Mayer, MD;  [facility];  Service: Endoscopy;  Laterality: N/A;  . KNEE ARTHROSCOPY Right    Right Knee, [place] [facility]  . LAPAROSCOPIC GASTRECTOMY N/A 07/18/2015   Procedure: SUB TOTAL GASTRECTOMY;  Surgeon: Stark Klein, MD;  [facility];  Service: General;  Laterality: N/A;  . LAPAROSCOPIC GASTROSTOMY N/A 07/18/2015   Procedure: FEEDING TUBE PLACEMENT;  Surgeon: Stark Klein, MD;  [facility];  Service: General;  Laterality: N/A;  . LAPAROSCOPY N/A 07/18/2015   Procedure: LAPAROSCOPY DIAGNOSTIC;  Surgeon: Stark Klein, MD;  [facility];  Service: General;  Laterality: N/A;  . MELANOMA EXCISION  02-01-16   abdominal wall  . PROSTATE BIOPSY  09/06/14  . ROTATOR CUFF REPAIR Right    R shoulder, [place]  . SAVORY DILATION N/A 02/06/2016   Procedure: SAVORY DILATION;  Surgeon: Gatha Mayer, MD;  [facility];  Service: Endoscopy;  Laterality: N/A;  . TOTAL SHOULDER ARTHROPLASTY Left 05/07/2017   Procedure: LEFT REVERSE TOTAL SHOULDER ARTHROPLASTY;  Surgeon: Justice Britain, MD;  Location: [place];  Service: Orthopedics;  Laterality: Left;  . TRIGGER FINGER RELEASE Left 1998  . UPPER GASTROINTESTINAL ENDOSCOPY     x14  . VASECTOMY      There were  no vitals filed for this visit.      Subjective Assessment - 06/19/17 0804    Subjective I get a little pain now and then, otherwise I am doing well.  I see the Dr. today.   Currently in Pain? Yes   Pain Score 1    Pain Location Shoulder   Pain Orientation Left   Aggravating Factors  certain times, unsure why, "I need to use ice more at home"            [facility] PT Assessment - 06/19/17 0001      ROM / Strength   AROM / PROM / Strength AROM     AROM   AROM Assessment Site Shoulder   Right/Left Shoulder Left   Left Shoulder Flexion 110 Degrees   Left Shoulder ABduction 90 Degrees   Left Shoulder External Rotation 50 Degrees                     [facility] Adult PT Treatment/Exercise - 06/19/17 0001      Shoulder Exercises: Supine   External Rotation Left;AAROM;10 reps   Flexion AAROM;10 reps   ABduction 10 reps;AAROM;Left  Shoulder Exercises: Standing   Other Standing Exercises ball rolling on mat table   Other Standing Exercises Upper cuts and hitchhikers x 20 each, wand exercises for flexion, ER and abduction, 1# bicep curls, 1# shrugs     Shoulder Exercises: Pulleys   Flexion 2 minutes   Flexion Limitations 115 degrees per protocol   ABduction 2 minutes   ABduction Limitations 90 degrees per protocol   Other Pulley Exercises ER 45 degrees per protocol 2 minutes     Shoulder Exercises: ROM/Strengthening   UBE (Upper Arm Bike) level 3 x 6 minutes   Other ROM/Strengthening Exercises Wand exercises     Vasopneumatic   Number Minutes Vasopneumatic  15 minutes   Vasopnuematic Location  Shoulder   Vasopneumatic Pressure Medium   Vasopneumatic Temperature  33     Manual Therapy   Manual Therapy Passive ROM   Passive ROM left shoulder ER 45, abduction 90, and flexion to 115 degree limits per protocol                  PT Short Term Goals - 06/05/17 0831      PT SHORT TERM GOAL #1   Title independent with initial HEP   Status Achieved            PT Long Term Goals - 06/19/17 0940      PT LONG TERM GOAL #1   Title return to playing tennis 1x/week   Status On-going     PT LONG TERM GOAL #2   Title Increase UE 4/5   Status On-going     PT LONG TERM GOAL #3   Title increase AROM of left shoulder flexion to 110 degrees   Status Partially Met     PT LONG TERM GOAL #4   Title decrease pain 50%   Status Partially Met     PT LONG TERM GOAL #5   Title dress and do hair without difficulty   Status Achieved               Plan - 06/19/17 0930    Clinical Impression Statement Patient is doing very well, he is at the end range of his protocol.  Reporting very little  pain. Reports able to dress and do hair without difficulty.  Occasional pain at times.   PT Next Visit Plan work in the 4-8 week protocol area   Consulted and Agree with Plan of Care Patient      Patient will benefit from skilled therapeutic intervention in order to improve the following deficits and impairments:  Decreased strength, Increased edema, Postural dysfunction, Decreased scar mobility, Pain, Increased muscle spasms, Impaired UE functional use, Decreased range of motion  Visit Diagnosis: Acute pain of left shoulder  Stiffness of left shoulder, not elsewhere classified  Localized swelling, mass and lump, left upper limb     Problem List Patient Active Problem List   Diagnosis Date Noted  . 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 Gastric AVM   . Thrombocytopenia ([place]) 05/24/2016  . Malignant melanoma ([place]) 01/08/2016  . Acute esophagitis   . Dysphagia   . Anastomotic stricture of stomach   . Esophageal stricture   . 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 Gastric cancer ([place]) 06/08/2015  . B12 deficiency 06/05/2015  . Prostate cancer ([place]) 10/24/2014  . Unspecified vitamin D deficiency 06/03/2014  . History of transfusion 12/16/2013  . Basal cell cancer 09/17/2012  . HEARING DEFICIT  12/21/2009  .  NONSPECIFIC ABNORMAL ELECTROCARDIOGRAM 12/21/2009  . History of colonic polyps 12/04/2008  . HYPERLIPIDEMIA 08/23/2007    Sumner Boast., PT 06/19/2017, 9:41 AM  [address] [place], [place], [address] Phone: [phone]   Fax:  [phone]  Name: James [name] MRN: [id number] Date of Birth: 1946/02/22

## 2017-06-23 ENCOUNTER — Ambulatory Visit: Payer: Medicare Other | Admitting: Physical Therapy

## 2017-06-26 ENCOUNTER — Encounter: Payer: Self-pay | Admitting: Physical Therapy

## 2017-06-26 ENCOUNTER — Ambulatory Visit: Payer: Medicare Other | Admitting: Physical Therapy

## 2017-06-26 DIAGNOSIS — M25612 Stiffness of left shoulder, not elsewhere classified: Secondary | ICD-10-CM | POA: Diagnosis not present

## 2017-06-26 DIAGNOSIS — R2232 Localized swelling, mass and lump, left upper limb: Secondary | ICD-10-CM

## 2017-06-26 DIAGNOSIS — M25512 Pain in left shoulder: Secondary | ICD-10-CM | POA: Diagnosis not present

## 2017-06-26 NOTE — Therapy (Signed)
[place] [address] [place], [place], [address] Phone: [phone]   Fax:  [phone]  Physical Therapy Treatment  Patient Details  Name: James [name] MRN: [id number] Date of Birth: 1946-08-07 Referring Provider: Onnie Graham  Encounter Date: 06/26/2017      PT End of Session - 06/26/17 0925    Visit Number 7   Date for PT Re-Evaluation 07/27/17   PT Start Time 4235   PT Stop Time 0841   PT Time Calculation (min) 44 min   Activity Tolerance Patient tolerated treatment well   Behavior During Therapy [facility] for tasks assessed/performed      Past Medical History:  Diagnosis Date  . Abnormal EKG    NS ST-T EKG Changes, (-) Nuclear Stress Test 03/2006  . Allergy   . Anemia    iron deficency  . Arthritis   . B12 deficiency 06/05/2015   B12 Low NL at 271 methtylmalonic acid high  . Cardiac murmur    nothing to be concerned with   . Cataract    extractions  . Duodenal ulcer 1972   transfusion required, 1 unit packed cells  . Dysphagia    history throat/stomach  cancer "can swallow some, uses more pureed type foods"  . Dyspnea    at times  . Esophageal reflux    no currlently  . Gastric cancer ([place]) 07/18/15   invasive adenocarcinoma  . Gastrostomy tube in place [facility])    intermittent feeding  . Hx of basal cell carcinoma   . Hyperlipemia    no per pt 04-11-16  . Melanoma ([place])   . Prostate cancer ([place]) 09/06/14   last radiation 4'16   . S/P radiation therapy 11/29/2014 through 01/23/2015    Prostate 7800 cGy in 40 sessions, seminal vesicles 5600 cGy in 40 sessions   . Skin cancer, basal cell   . Transfusion history    '72 -s/p surgery for duodenal ulcer, 12/17    Past Surgical History:  Procedure Laterality Date  . BALLOON DILATION N/A 09/12/2015   Procedure: BALLOON DILATION;  Surgeon: Gatha Mayer, MD;  [facility];   Service: Endoscopy;  Laterality: N/A;  . BALLOON DILATION N/A 09/21/2015   Procedure: BALLOON DILATION;  Surgeon: Gatha Mayer, MD;  [facility];  Service: Endoscopy;  Laterality: N/A;  . BALLOON DILATION N/A 10/04/2015   Procedure: BALLOON DILATION;  Surgeon: Milus Banister, MD;  [facility];  Service: Endoscopy;  Laterality: N/A;  . BALLOON DILATION N/A 12/10/2015   Procedure: BALLOON DILATION;  Surgeon: Gatha Mayer, MD;  [facility];  Service: Endoscopy;  Laterality: N/A;  . BALLOON DILATION N/A 10/10/2016   Procedure: BALLOON DILATION;  Surgeon: Gatha Mayer, MD;  [facility];  Service: Endoscopy;  Laterality: N/A;  . CATARACT EXTRACTION, BILATERAL Bilateral   . COLONOSCOPY    . colonoscopy with polypectomy  02/2012   Dr Sharlett Iles  . ESOPHAGOGASTRODUODENOSCOPY (EGD) WITH PROPOFOL N/A 08/17/2015   Procedure: ESOPHAGOGASTRODUODENOSCOPY (EGD) WITH PROPOFOL;  Surgeon: Gatha Mayer, MD;  Location: [place];  Service: Endoscopy;  Laterality: N/A;  . ESOPHAGOGASTRODUODENOSCOPY (EGD) WITH PROPOFOL N/A 09/12/2015   Procedure: ESOPHAGOGASTRODUODENOSCOPY (EGD) WITH PROPOFOL;  Surgeon: Gatha Mayer, MD;  [facility];  Service: Endoscopy;  Laterality: N/A;  . ESOPHAGOGASTRODUODENOSCOPY (EGD) WITH PROPOFOL N/A 09/21/2015   Procedure: ESOPHAGOGASTRODUODENOSCOPY (EGD) WITH PROPOFOL;  Surgeon: Gatha Mayer, MD;  [facility];  Service: Endoscopy;  Laterality: N/A;  . ESOPHAGOGASTRODUODENOSCOPY (EGD) WITH PROPOFOL N/A 10/04/2015   Procedure: ESOPHAGOGASTRODUODENOSCOPY (EGD) WITH PROPOFOL;  Surgeon: Milus Banister, MD;  [facility];  Service: Endoscopy;  Laterality: N/A;  possible stricture  . ESOPHAGOGASTRODUODENOSCOPY (EGD) WITH PROPOFOL N/A 12/10/2015   Procedure: ESOPHAGOGASTRODUODENOSCOPY (EGD) WITH PROPOFOL;  Surgeon: Gatha Mayer, MD;  [facility];  Service: Endoscopy;  Laterality: N/A;  .  ESOPHAGOGASTRODUODENOSCOPY (EGD) WITH PROPOFOL N/A 02/06/2016   Procedure: ESOPHAGOGASTRODUODENOSCOPY (EGD) WITH PROPOFOL;  Surgeon: Gatha Mayer, MD;  [facility];  Service: Endoscopy;  Laterality: N/A;  . ESOPHAGOGASTRODUODENOSCOPY (EGD) WITH PROPOFOL N/A 10/10/2016   Procedure: ESOPHAGOGASTRODUODENOSCOPY (EGD) WITH PROPOFOL;  Surgeon: Gatha Mayer, MD;  [facility];  Service: Endoscopy;  Laterality: N/A;  needs APC   . EUS N/A 06/14/2015   Procedure: ESOPHAGEAL ENDOSCOPIC ULTRASOUND (EUS) RADIAL;  Surgeon: Milus Banister, MD;  [facility];  Service: Endoscopy;  Laterality: N/A;  . HOT HEMOSTASIS N/A 10/10/2016   Procedure: HOT HEMOSTASIS (ARGON PLASMA COAGULATION/BICAP);  Surgeon: Gatha Mayer, MD;  [facility];  Service: Endoscopy;  Laterality: N/A;  . KNEE ARTHROSCOPY Right    Right Knee, [place] [facility]  . LAPAROSCOPIC GASTRECTOMY N/A 07/18/2015   Procedure: SUB TOTAL GASTRECTOMY;  Surgeon: Stark Klein, MD;  [facility];  Service: General;  Laterality: N/A;  . LAPAROSCOPIC GASTROSTOMY N/A 07/18/2015   Procedure: FEEDING TUBE PLACEMENT;  Surgeon: Stark Klein, MD;  [facility];  Service: General;  Laterality: N/A;  . LAPAROSCOPY N/A 07/18/2015   Procedure: LAPAROSCOPY DIAGNOSTIC;  Surgeon: Stark Klein, MD;  [facility];  Service: General;  Laterality: N/A;  . MELANOMA EXCISION  02-01-16   abdominal wall  . PROSTATE BIOPSY  09/06/14  . ROTATOR CUFF REPAIR Right    R shoulder, [place]  . SAVORY DILATION N/A 02/06/2016   Procedure: SAVORY DILATION;  Surgeon: Gatha Mayer, MD;  [facility];  Service: Endoscopy;  Laterality: N/A;  . TOTAL SHOULDER ARTHROPLASTY Left 05/07/2017   Procedure: LEFT REVERSE TOTAL SHOULDER ARTHROPLASTY;  Surgeon: Justice Britain, MD;  Location: [facility];  Service: Orthopedics;  Laterality: Left;  . TRIGGER FINGER RELEASE Left 1998  . UPPER GASTROINTESTINAL ENDOSCOPY     x14  . VASECTOMY      There were  no vitals filed for this visit.      Subjective Assessment - 06/26/17 0800    Subjective I saw the MD, he felt like I was doing very well.  He feels like I can put and chip.   Currently in Pain? No/denies                         [facility] Adult PT Treatment/Exercise - 06/26/17 0001      Shoulder Exercises: Standing   Extension 20 reps;Theraband   Theraband Level (Shoulder Extension) Level 2 (Red)   Row 20 reps;Theraband   Theraband Level (Shoulder Row) Level 2 (Red)   Other Standing Exercises ball throwing 3 ways, small golf swings    Other Standing Exercises Upper cuts and hitchhikers x 20 each, wand exercises for flexion, ER and abduction, 1# bicep curls, 1# shrugs     Shoulder Exercises: ROM/Strengthening   UBE (Upper Arm Bike) Constant Work 25 watts x 5 minutes   Wall Pushups 20 reps   "W" Arms 20 reps   Other ROM/Strengthening Exercises Wand exercises   Other ROM/Strengthening Exercises 15# triceps, 2# biceps, money exercise  Manual Therapy   Manual Therapy Passive ROM   Passive ROM left shoulder ER 45, abduction 90, and flexion to 115 degree limits per protocol                  PT Short Term Goals - 06/05/17 0831      PT SHORT TERM GOAL #1   Title independent with initial HEP   Status Achieved           PT Long Term Goals - 06/26/17 1008      PT LONG TERM GOAL #2   Title Increase UE 4/5   Status On-going     PT LONG TERM GOAL #4   Title decrease pain 50%   Status Achieved               Plan - 06/26/17 0925    Clinical Impression Statement Saw MD.  Very pleased.  Started some increased strength exercises today.  Had no problems with the increased exercises.     PT Next Visit Plan next week is week 8   Consulted and Agree with Plan of Care Patient      Patient will benefit from skilled therapeutic intervention in order to improve the following deficits and impairments:  Decreased strength, Increased edema, Postural  dysfunction, Decreased scar mobility, Pain, Increased muscle spasms, Impaired UE functional use, Decreased range of motion  Visit Diagnosis: Acute pain of left shoulder  Stiffness of left shoulder, not elsewhere classified  Localized swelling, mass and lump, left upper limb     Problem List Patient Active Problem List   Diagnosis Date Noted  . 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 Gastric AVM   . Thrombocytopenia ([place]) 05/24/2016  . Malignant melanoma ([place]) 01/08/2016  . Acute esophagitis   . Dysphagia   . Anastomotic stricture of stomach   . Esophageal stricture   . 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 Gastric cancer ([place]) 06/08/2015  . B12 deficiency 06/05/2015  . Prostate cancer ([place]) 10/24/2014  . Unspecified vitamin D deficiency 06/03/2014  . History of transfusion 12/16/2013  . Basal cell cancer 09/17/2012  . HEARING DEFICIT 12/21/2009  . NONSPECIFIC ABNORMAL ELECTROCARDIOGRAM 12/21/2009  . History of colonic polyps 12/04/2008  . HYPERLIPIDEMIA 08/23/2007    ALBRIGHT,MICHAEL W.,PT 06/26/2017, 10:09 AM  [address] [place], [place], [address] Phone: [phone]   Fax:  [phone]  Name: James [name] MRN: [id number] Date of Birth: 07/15/1946

## 2017-07-01 DIAGNOSIS — Z23 Encounter for immunization: Secondary | ICD-10-CM | POA: Diagnosis not present

## 2017-07-03 ENCOUNTER — Encounter: Payer: Self-pay | Admitting: Physical Therapy

## 2017-07-03 ENCOUNTER — Ambulatory Visit: Payer: Medicare Other | Admitting: Physical Therapy

## 2017-07-03 DIAGNOSIS — R2232 Localized swelling, mass and lump, left upper limb: Secondary | ICD-10-CM | POA: Diagnosis not present

## 2017-07-03 DIAGNOSIS — M25612 Stiffness of left shoulder, not elsewhere classified: Secondary | ICD-10-CM

## 2017-07-03 DIAGNOSIS — M25512 Pain in left shoulder: Secondary | ICD-10-CM

## 2017-07-03 DIAGNOSIS — C61 Malignant neoplasm of prostate: Secondary | ICD-10-CM | POA: Diagnosis not present

## 2017-07-03 NOTE — Therapy (Signed)
[facility] [place], [place], [address] Phone: [phone]   Fax:  [phone]  Physical Therapy Treatment  Patient Details  Name: James [name] MRN: [id number] Date of Birth: 10/08/1945 Referring Provider: Onnie Graham  Encounter Date: 07/03/2017      PT End of Session - 07/03/17 0839    Visit Number 8   Date for PT Re-Evaluation 07/27/17   PT Start Time 0758   PT Stop Time 0840   PT Time Calculation (min) 42 min   Activity Tolerance Patient tolerated treatment well   Behavior During Therapy [facility] for tasks assessed/performed      Past Medical History:  Diagnosis Date  . Abnormal EKG    NS ST-T EKG Changes, (-) Nuclear Stress Test 03/2006  . Allergy   . Anemia    iron deficency  . Arthritis   . B12 deficiency 06/05/2015   B12 Low NL at 271 methtylmalonic acid high  . Cardiac murmur    nothing to be concerned with   . Cataract    extractions  . Duodenal ulcer 1972   transfusion required, 1 unit packed cells  . Dysphagia    history throat/stomach  cancer "can swallow some, uses more pureed type foods"  . Dyspnea    at times  . Esophageal reflux    no currlently  . Gastric cancer ([place]) 07/18/15   invasive adenocarcinoma  . Gastrostomy tube in place [facility])    intermittent feeding  . Hx of basal cell carcinoma   . Hyperlipemia    no per pt 04-11-16  . Melanoma ([place])   . Prostate cancer ([place]) 09/06/14   last radiation 4'16   . S/P radiation therapy 11/29/2014 through 01/23/2015    Prostate 7800 cGy in 40 sessions, seminal vesicles 5600 cGy in 40 sessions   . Skin cancer, basal cell   . Transfusion history    '72 -s/p surgery for duodenal ulcer, 12/17    Past Surgical History:  Procedure Laterality Date  . BALLOON DILATION N/A 09/12/2015   Procedure: BALLOON DILATION;  Surgeon: Gatha Mayer, MD;  [facility];   Service: Endoscopy;  Laterality: N/A;  . BALLOON DILATION N/A 09/21/2015   Procedure: BALLOON DILATION;  Surgeon: Gatha Mayer, MD;  [facility];  Service: Endoscopy;  Laterality: N/A;  . BALLOON DILATION N/A 10/04/2015   Procedure: BALLOON DILATION;  Surgeon: Milus Banister, MD;  [facility];  Service: Endoscopy;  Laterality: N/A;  . BALLOON DILATION N/A 12/10/2015   Procedure: BALLOON DILATION;  Surgeon: Gatha Mayer, MD;  [facility];  Service: Endoscopy;  Laterality: N/A;  . BALLOON DILATION N/A 10/10/2016   Procedure: BALLOON DILATION;  Surgeon: Gatha Mayer, MD;  [facility];  Service: Endoscopy;  Laterality: N/A;  . CATARACT EXTRACTION, BILATERAL Bilateral   . COLONOSCOPY    . colonoscopy with polypectomy  02/2012   Dr Sharlett Iles  . ESOPHAGOGASTRODUODENOSCOPY (EGD) WITH PROPOFOL N/A 08/17/2015   Procedure: ESOPHAGOGASTRODUODENOSCOPY (EGD) WITH PROPOFOL;  Surgeon: Gatha Mayer, MD;  Location: [place];  Service: Endoscopy;  Laterality: N/A;  . ESOPHAGOGASTRODUODENOSCOPY (EGD) WITH PROPOFOL N/A 09/12/2015   Procedure: ESOPHAGOGASTRODUODENOSCOPY (EGD) WITH PROPOFOL;  Surgeon: Gatha Mayer, MD;  [facility];  Service: Endoscopy;  Laterality: N/A;  . ESOPHAGOGASTRODUODENOSCOPY (EGD) WITH PROPOFOL N/A 09/21/2015   Procedure: ESOPHAGOGASTRODUODENOSCOPY (EGD) WITH PROPOFOL;  Surgeon: Gatha Mayer, MD;  [facility];  Service: Endoscopy;  Laterality: N/A;  . ESOPHAGOGASTRODUODENOSCOPY (EGD) WITH PROPOFOL N/A 10/04/2015   Procedure: ESOPHAGOGASTRODUODENOSCOPY (EGD) WITH PROPOFOL;  Surgeon: Milus Banister, MD;  [facility];  Service: Endoscopy;  Laterality: N/A;  possible stricture  . ESOPHAGOGASTRODUODENOSCOPY (EGD) WITH PROPOFOL N/A 12/10/2015   Procedure: ESOPHAGOGASTRODUODENOSCOPY (EGD) WITH PROPOFOL;  Surgeon: Gatha Mayer, MD;  [facility];  Service: Endoscopy;  Laterality: N/A;  .  ESOPHAGOGASTRODUODENOSCOPY (EGD) WITH PROPOFOL N/A 02/06/2016   Procedure: ESOPHAGOGASTRODUODENOSCOPY (EGD) WITH PROPOFOL;  Surgeon: Gatha Mayer, MD;  [facility];  Service: Endoscopy;  Laterality: N/A;  . ESOPHAGOGASTRODUODENOSCOPY (EGD) WITH PROPOFOL N/A 10/10/2016   Procedure: ESOPHAGOGASTRODUODENOSCOPY (EGD) WITH PROPOFOL;  Surgeon: Gatha Mayer, MD;  [facility];  Service: Endoscopy;  Laterality: N/A;  needs APC   . EUS N/A 06/14/2015   Procedure: ESOPHAGEAL ENDOSCOPIC ULTRASOUND (EUS) RADIAL;  Surgeon: Milus Banister, MD;  [facility];  Service: Endoscopy;  Laterality: N/A;  . HOT HEMOSTASIS N/A 10/10/2016   Procedure: HOT HEMOSTASIS (ARGON PLASMA COAGULATION/BICAP);  Surgeon: Gatha Mayer, MD;  [facility];  Service: Endoscopy;  Laterality: N/A;  . KNEE ARTHROSCOPY Right    Right Knee, [place] [facility]  . LAPAROSCOPIC GASTRECTOMY N/A 07/18/2015   Procedure: SUB TOTAL GASTRECTOMY;  Surgeon: Stark Klein, MD;  [facility];  Service: General;  Laterality: N/A;  . LAPAROSCOPIC GASTROSTOMY N/A 07/18/2015   Procedure: FEEDING TUBE PLACEMENT;  Surgeon: Stark Klein, MD;  [facility];  Service: General;  Laterality: N/A;  . LAPAROSCOPY N/A 07/18/2015   Procedure: LAPAROSCOPY DIAGNOSTIC;  Surgeon: Stark Klein, MD;  [facility];  Service: General;  Laterality: N/A;  . MELANOMA EXCISION  02-01-16   abdominal wall  . PROSTATE BIOPSY  09/06/14  . ROTATOR CUFF REPAIR Right    R shoulder, [place]  . SAVORY DILATION N/A 02/06/2016   Procedure: SAVORY DILATION;  Surgeon: Gatha Mayer, MD;  [facility];  Service: Endoscopy;  Laterality: N/A;  . TOTAL SHOULDER ARTHROPLASTY Left 05/07/2017   Procedure: LEFT REVERSE TOTAL SHOULDER ARTHROPLASTY;  Surgeon: Justice Britain, MD;  Location: [place];  Service: Orthopedics;  Laterality: Left;  . TRIGGER FINGER RELEASE Left 1998  . UPPER GASTROINTESTINAL ENDOSCOPY     x14  . VASECTOMY      There were  no vitals filed for this visit.      Subjective Assessment - 07/03/17 0757    Subjective No problems, feeling pretty good   Currently in Pain? Yes   Pain Score 2    Pain Location Shoulder   Pain Orientation Left   Pain Descriptors / Indicators Sore                         [facility] Adult PT Treatment/Exercise - 07/03/17 0001      Shoulder Exercises: Standing   External Rotation 20 reps;Theraband   Theraband Level (Shoulder External Rotation) Level 1 (Yellow)   Internal Rotation 20 reps;Theraband   Theraband Level (Shoulder Internal Rotation) Level 1 (Yellow)   Extension 20 reps;Theraband   Theraband Level (Shoulder Extension) Level 3 (Green)   Row 20 reps;Theraband   Theraband Level (Shoulder Row) Level 3 (Green)     Shoulder Exercises: ROM/Strengthening   UBE (Upper Arm Bike) constant work 30 watts x 5 minutes   Cybex Row 2 plate   Cybex Row Limitations 2x15   Wall Wash 2# flexion, circles and scaption   Wall Pushups 20 reps  Other ROM/Strengthening Exercises Wand exercises, ball passes around waist, 2# bent over row, 2# bent over extension   Other ROM/Strengthening Exercises 15# triceps, 2# biceps, money exercise, lat pulls 20#     Manual Therapy   Manual Therapy Passive ROM   Passive ROM left shoulder ER 45, abduction 90, and flexion to 115 degree limits per protocol                  PT Short Term Goals - 06/05/17 0831      PT SHORT TERM GOAL #1   Title independent with initial HEP   Status Achieved           PT Long Term Goals - 07/03/17 0841      PT LONG TERM GOAL #1   Title return to playing tennis 1x/week   Status On-going     PT LONG TERM GOAL #2   Title Increase UE 4/5   Status On-going     PT LONG TERM GOAL #3   Title increase AROM of left shoulder flexion to 110 degrees   Status Partially Met               Plan - 07/03/17 0840    Clinical Impression Statement Doing very well, tight anteriorly.  Strength is  doing well   PT Next Visit Plan next week progress as tolerated   Consulted and Agree with Plan of Care Patient      Patient will benefit from skilled therapeutic intervention in order to improve the following deficits and impairments:  Decreased strength, Increased edema, Postural dysfunction, Decreased scar mobility, Pain, Increased muscle spasms, Impaired UE functional use, Decreased range of motion  Visit Diagnosis: Acute pain of left shoulder  Stiffness of left shoulder, not elsewhere classified     Problem List Patient Active Problem List   Diagnosis Date Noted  . 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 Gastric AVM   . Thrombocytopenia ([place]) 05/24/2016  . Malignant melanoma ([place]) 01/08/2016  . Acute esophagitis   . Dysphagia   . Anastomotic stricture of stomach   . Esophageal stricture   . 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 Gastric cancer ([place]) 06/08/2015  . B12 deficiency 06/05/2015  . Prostate cancer ([place]) 10/24/2014  . Unspecified vitamin D deficiency 06/03/2014  . History of transfusion 12/16/2013  . Basal cell cancer 09/17/2012  . HEARING DEFICIT 12/21/2009  . NONSPECIFIC ABNORMAL ELECTROCARDIOGRAM 12/21/2009  . History of colonic polyps 12/04/2008  . HYPERLIPIDEMIA 08/23/2007    Sumner Boast., PT 07/03/2017, 8:41 AM  [facility] [address], [place], [address] Phone: [phone]   Fax:  [phone]  Name: James [name] MRN: [id number] Date of Birth: 1946-03-21

## 2017-07-07 ENCOUNTER — Ambulatory Visit (AMBULATORY_SURGERY_CENTER): Payer: Self-pay

## 2017-07-07 VITALS — Ht 66.0 in | Wt 152.8 lb

## 2017-07-07 DIAGNOSIS — Z86010 Personal history of colonic polyps: Secondary | ICD-10-CM

## 2017-07-07 NOTE — Progress Notes (Signed)
Per pt, no allergies to soy or egg products.Pt not taking any weight loss meds or using  O2 at home.   Pt refused Emmi video. 

## 2017-07-08 ENCOUNTER — Ambulatory Visit (INDEPENDENT_AMBULATORY_CARE_PROVIDER_SITE_OTHER): Payer: Medicare Other | Admitting: Internal Medicine

## 2017-07-08 ENCOUNTER — Encounter: Payer: Self-pay | Admitting: Internal Medicine

## 2017-07-08 VITALS — BP 126/74 | HR 59 | Temp 98.0°F | Resp 16 | Wt 152.0 lb

## 2017-07-08 DIAGNOSIS — R51 Headache: Secondary | ICD-10-CM | POA: Diagnosis not present

## 2017-07-08 DIAGNOSIS — R519 Headache, unspecified: Secondary | ICD-10-CM | POA: Insufficient documentation

## 2017-07-08 NOTE — Progress Notes (Signed)
Subjective:    Patient ID: James [name], male    DOB: Sep 30, 1946, 71 y.o.   MRN: [id number]  HPI He is here for an acute visit.   Pain in the back of his head: It started approximately 2 weeks ago and has gotten slightly better. He is taking in the right posterior part of his head that it is located in an area approximately the size of the tangerine. There is once specific area if he pushes on the pain is reproducible. The pain is intermittent. He figured it was nerve pain.  He is still recovering from left shoulder replacement. He is doing physical therapy now, which involves head and neck exercises. He wonders if that caused the pain. He does have some stiffness in his neck and mild pain at times.  He is taking Tylenol.   Medications and allergies reviewed with patient and updated if appropriate.  Patient Active Problem List   Diagnosis Date Noted  . 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 Gastric AVM   . Thrombocytopenia ([place]) 05/24/2016  . Malignant melanoma ([place]) 01/08/2016  . Acute esophagitis   . Dysphagia   . Anastomotic stricture of stomach   . Esophageal stricture   . 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 Gastric cancer ([place]) 06/08/2015  . B12 deficiency 06/05/2015  . Prostate cancer ([place]) 10/24/2014  . Unspecified vitamin D deficiency 06/03/2014  . History of transfusion 12/16/2013  . Basal cell cancer 09/17/2012  . HEARING DEFICIT 12/21/2009  . NONSPECIFIC ABNORMAL ELECTROCARDIOGRAM 12/21/2009  . History of colonic polyps 12/04/2008  . HYPERLIPIDEMIA 08/23/2007    Current Outpatient Prescriptions on File Prior to Visit  Medication Sig Dispense Refill  . acetaminophen (TYLENOL) 500 MG tablet Take 1,000 mg by mouth every 6 (six) hours as needed for mild pain.     . bisacodyl (DULCOLAX) 5 MG EC tablet Take 5 mg by mouth. Dulcolax 5 mg bowel prep # 4-Take as directed    . cyanocobalamin 1000 MCG tablet Take 1,000 mcg by  mouth every other day.     . diazepam (VALIUM) 5 MG tablet Take 0.5-1 tablets (2.5-5 mg total) by mouth every 6 (six) hours as needed for muscle spasms or sedation. 40 tablet 1  . ferrous sulfate 325 (65 FE) MG tablet Take 1 tablet (325 mg total) by mouth daily with breakfast. (Patient taking differently: Take 325 mg by mouth 2 (two) times daily with a meal. )  3  . Multiple Vitamin (MULTIVITAMIN WITH MINERALS) TABS tablet Take 1 tablet by mouth every other day.     [name] omeprazole (PRILOSEC) 40 MG capsule take 1 capsule by mouth once daily 30 MINUTES BEFORE BREAKFAST/SUPPER.  OKAY TO OPEN AND SPRINKLE ON APPLESAUCE (Patient taking differently: Take 40 mg by mouth daily. take 1 capsule by mouth once daily 30 MINUTES BEFORE BREAKFAST/SUPPER.  OKAY TO OPEN AND SPRINKLE ON APPLESAUCE) 180 capsule 3  . ondansetron (ZOFRAN) 4 MG tablet Take 1 tablet (4 mg total) by mouth every 8 (eight) hours as needed for nausea or vomiting. 20 tablet 0  . oxyCODONE-acetaminophen (PERCOCET) 5-325 MG tablet Take 1-2 tablets by mouth every 4 (four) hours as needed. 40 tablet 0  . Polyethylene Glycol 3350 (MIRALAX PO) Take by mouth. Miralax 238 gm bowel prep-Take as directed    . pravastatin (PRAVACHOL) 40 MG tablet Take 1 tablet (40 mg total) by mouth at bedtime. Must keep June appt for future refills 90 tablet  3   No current facility-administered medications on file prior to visit.     Past Medical History:  Diagnosis Date  . Abnormal EKG    NS ST-T EKG Changes, (-) Nuclear Stress Test 03/2006  . Allergy   . Anemia    iron deficency  . Arthritis   . B12 deficiency 06/05/2015   B12 Low NL at 271 methtylmalonic acid high  . Cardiac murmur    nothing to be concerned with   . Cataract    extractions  . Duodenal ulcer 1972   transfusion required, 1 unit packed cells  . Dysphagia    history throat/stomach  cancer "can swallow some, uses more pureed type foods"  . Dyspnea    at times  . Esophageal reflux    no  currlently  . Gastric cancer ([place]) 07/18/15   invasive adenocarcinoma  . Gastrostomy tube in place [facility]) 2016   intermittent feeding  . Hx of basal cell carcinoma   . Hyperlipemia    no per pt 04-11-16  . Melanoma ([place])   . Prostate cancer ([place]) 09/06/14   last radiation 4'16   . S/P radiation therapy 11/29/2014 through 01/23/2015   Prostate 7800 cGy in 40 sessions, seminal vesicles 5600 cGy in 40 sessions   . Skin cancer, basal cell   . Transfusion history    '72 -s/p surgery for duodenal ulcer, 12/17    Past Surgical History:  Procedure Laterality Date  . BALLOON DILATION N/A 09/12/2015   Procedure: BALLOON DILATION;  Surgeon: Gatha Mayer, MD;  [facility];  Service: Endoscopy;  Laterality: N/A;  . BALLOON DILATION N/A 09/21/2015   Procedure: BALLOON DILATION;  Surgeon: Gatha Mayer, MD;  [facility];  Service: Endoscopy;  Laterality: N/A;  . BALLOON DILATION N/A 10/04/2015   Procedure: BALLOON DILATION;  Surgeon: Milus Banister, MD;  [facility];  Service: Endoscopy;  Laterality: N/A;  . BALLOON DILATION N/A 12/10/2015   Procedure: BALLOON DILATION;  Surgeon: Gatha Mayer, MD;  [facility];  Service: Endoscopy;  Laterality: N/A;  . BALLOON DILATION N/A 10/10/2016   Procedure: BALLOON DILATION;  Surgeon: Gatha Mayer, MD;  [facility];  Service: Endoscopy;  Laterality: N/A;  . CATARACT EXTRACTION, BILATERAL Bilateral   . COLONOSCOPY    . colonoscopy with polypectomy  02/2012   Dr Sharlett Iles  . ESOPHAGOGASTRODUODENOSCOPY (EGD) WITH PROPOFOL N/A 08/17/2015   Procedure: ESOPHAGOGASTRODUODENOSCOPY (EGD) WITH PROPOFOL;  Surgeon: Gatha Mayer, MD;  Location: [place];  Service: Endoscopy;  Laterality: N/A;  . ESOPHAGOGASTRODUODENOSCOPY (EGD) WITH PROPOFOL N/A 09/12/2015   Procedure: ESOPHAGOGASTRODUODENOSCOPY (EGD) WITH PROPOFOL;  Surgeon: Gatha Mayer,  MD;  [facility];  Service: Endoscopy;  Laterality: N/A;  . ESOPHAGOGASTRODUODENOSCOPY (EGD) WITH PROPOFOL N/A 09/21/2015   Procedure: ESOPHAGOGASTRODUODENOSCOPY (EGD) WITH PROPOFOL;  Surgeon: Gatha Mayer, MD;  [facility];  Service: Endoscopy;  Laterality: N/A;  . ESOPHAGOGASTRODUODENOSCOPY (EGD) WITH PROPOFOL N/A 10/04/2015   Procedure: ESOPHAGOGASTRODUODENOSCOPY (EGD) WITH PROPOFOL;  Surgeon: Milus Banister, MD;  [facility];  Service: Endoscopy;  Laterality: N/A;  possible stricture  . ESOPHAGOGASTRODUODENOSCOPY (EGD) WITH PROPOFOL N/A 12/10/2015   Procedure: ESOPHAGOGASTRODUODENOSCOPY (EGD) WITH PROPOFOL;  Surgeon: Gatha Mayer, MD;  [facility];  Service: Endoscopy;  Laterality: N/A;  . ESOPHAGOGASTRODUODENOSCOPY (EGD) WITH PROPOFOL N/A 02/06/2016   Procedure: ESOPHAGOGASTRODUODENOSCOPY (EGD) WITH PROPOFOL;  Surgeon: Gatha Mayer, MD;  [facility];  Service: Endoscopy;  Laterality: N/A;  .  ESOPHAGOGASTRODUODENOSCOPY (EGD) WITH PROPOFOL N/A 10/10/2016   Procedure: ESOPHAGOGASTRODUODENOSCOPY (EGD) WITH PROPOFOL;  Surgeon: Gatha Mayer, MD;  [facility];  Service: Endoscopy;  Laterality: N/A;  needs APC   . EUS N/A 06/14/2015   Procedure: ESOPHAGEAL ENDOSCOPIC ULTRASOUND (EUS) RADIAL;  Surgeon: Milus Banister, MD;  [facility];  Service: Endoscopy;  Laterality: N/A;  . HOT HEMOSTASIS N/A 10/10/2016   Procedure: HOT HEMOSTASIS (ARGON PLASMA COAGULATION/BICAP);  Surgeon: Gatha Mayer, MD;  [facility];  Service: Endoscopy;  Laterality: N/A;  . KNEE ARTHROSCOPY Right    Right Knee, [place] [facility]  . LAPAROSCOPIC GASTRECTOMY N/A 07/18/2015   Procedure: SUB TOTAL GASTRECTOMY;  Surgeon: Stark Klein, MD;  [facility];  Service: General;  Laterality: N/A;  . LAPAROSCOPIC GASTROSTOMY N/A 07/18/2015   Procedure: FEEDING TUBE PLACEMENT;  Surgeon: Stark Klein, MD;  [facility];  Service: General;   Laterality: N/A;  . LAPAROSCOPY N/A 07/18/2015   Procedure: LAPAROSCOPY DIAGNOSTIC;  Surgeon: Stark Klein, MD;  [facility];  Service: General;  Laterality: N/A;  . MELANOMA EXCISION  02-01-16   abdominal wall  . PROSTATE BIOPSY  09/06/14  . ROTATOR CUFF REPAIR Right    R shoulder, [place]  . SAVORY DILATION N/A 02/06/2016   Procedure: SAVORY DILATION;  Surgeon: Gatha Mayer, MD;  [facility];  Service: Endoscopy;  Laterality: N/A;  . TOTAL SHOULDER ARTHROPLASTY Left 05/07/2017   Procedure: LEFT REVERSE TOTAL SHOULDER ARTHROPLASTY;  Surgeon: Justice Britain, MD;  Location: [facility];  Service: Orthopedics;  Laterality: Left;  . TRIGGER FINGER RELEASE Left 1998  . UPPER GASTROINTESTINAL ENDOSCOPY     x14  . VASECTOMY      Social History   Social History  . Marital status: Married    Spouse name: N/A  . Number of children: 2  . Years of education: N/A   Occupational History  . retired    Social History Main Topics  . Smoking status: Former Smoker    Packs/day: 0.50    Years: 5.00    Types: Cigarettes    Quit date: 10/07/1967  . Smokeless tobacco: Never Used     Comment: smoked [place] , up to < 1 ppd  . Alcohol use 0.0 oz/week     Comment: maybe once a week  . Drug use: No  . Sexual activity: Not Asked   Other Topics Concern  . None   Social History Narrative   Married and retired - 1 son and 1 daughter    Lives at [place] and active golfer, tennis player   05/30/2015       Family History  Problem Relation Age of Onset  . Diabetes Father   . Heart attack Father 15  . Liver disease Father        Related to Alcohol Use   . Alcohol abuse Mother   . Cirrhosis Mother        died in her 64s  . Multiple myeloma Brother   . Diabetes Sister   . Colon cancer Neg Hx   . Stomach cancer Neg Hx   . Stroke Neg Hx   . Esophageal cancer Neg Hx   . Rectal cancer Neg Hx     Review of Systems  Musculoskeletal: Positive for neck pain (Mild) and neck  stiffness.  Neurological: Negative for dizziness, weakness, light-headedness, numbness and headaches (Pain on head, no headache).       Objective:   Vitals:  07/08/17 1604  BP: 126/74  Pulse: (!) 59  Resp: 16  Temp: 98 F (36.7 C)  SpO2: 98%   Filed Weights   07/08/17 1604  Weight: 152 lb (68.9 kg)   Body mass index is 24.53 kg/m.  Wt Readings from Last 3 Encounters:  07/08/17 152 lb (68.9 kg)  07/07/17 152 lb 12.8 oz (69.3 kg)  05/07/17 151 lb (68.5 kg)     Physical Exam  Constitutional: He appears well-developed and well-nourished. No distress.  HENT:  Head: Normocephalic and atraumatic.  Musculoskeletal: Normal range of motion.  No tenderness with palpation posterior neck or base of skull, there is tenderness with palpation right posterior head  Neurological:  Normal sensation neck and head  Skin: Skin is warm and dry. No rash noted. He is not diaphoretic.          Assessment & Plan:   See Problem List for Assessment and Plan of chronic medical problems.

## 2017-07-08 NOTE — Assessment & Plan Note (Signed)
Head pain, likely nerve related Possibly pinched nerve from neck He has Valium at home and will use that as a muscle relaxer at night to see if that helps Continue Tylenol-we'll avoid Advil given his stomach surgery Heat, ice Call if no improvement

## 2017-07-08 NOTE — Patient Instructions (Addendum)
Try the valium at night.  Try heat/ice.  Continue the tylenol.    Call if no improvement

## 2017-07-09 DIAGNOSIS — Z85820 Personal history of malignant melanoma of skin: Secondary | ICD-10-CM | POA: Diagnosis not present

## 2017-07-09 DIAGNOSIS — L853 Xerosis cutis: Secondary | ICD-10-CM | POA: Diagnosis not present

## 2017-07-09 DIAGNOSIS — L905 Scar conditions and fibrosis of skin: Secondary | ICD-10-CM | POA: Diagnosis not present

## 2017-07-09 DIAGNOSIS — D692 Other nonthrombocytopenic purpura: Secondary | ICD-10-CM | POA: Diagnosis not present

## 2017-07-09 DIAGNOSIS — Z85828 Personal history of other malignant neoplasm of skin: Secondary | ICD-10-CM | POA: Diagnosis not present

## 2017-07-09 DIAGNOSIS — D2272 Melanocytic nevi of left lower limb, including hip: Secondary | ICD-10-CM | POA: Diagnosis not present

## 2017-07-09 DIAGNOSIS — D2239 Melanocytic nevi of other parts of face: Secondary | ICD-10-CM | POA: Diagnosis not present

## 2017-07-09 DIAGNOSIS — L814 Other melanin hyperpigmentation: Secondary | ICD-10-CM | POA: Diagnosis not present

## 2017-07-09 DIAGNOSIS — L821 Other seborrheic keratosis: Secondary | ICD-10-CM | POA: Diagnosis not present

## 2017-07-09 DIAGNOSIS — D1801 Hemangioma of skin and subcutaneous tissue: Secondary | ICD-10-CM | POA: Diagnosis not present

## 2017-07-09 DIAGNOSIS — L720 Epidermal cyst: Secondary | ICD-10-CM | POA: Diagnosis not present

## 2017-07-09 DIAGNOSIS — D2262 Melanocytic nevi of left upper limb, including shoulder: Secondary | ICD-10-CM | POA: Diagnosis not present

## 2017-07-10 ENCOUNTER — Ambulatory Visit: Payer: Medicare Other | Attending: Orthopedic Surgery | Admitting: Physical Therapy

## 2017-07-10 ENCOUNTER — Encounter: Payer: Self-pay | Admitting: Physical Therapy

## 2017-07-10 DIAGNOSIS — R2232 Localized swelling, mass and lump, left upper limb: Secondary | ICD-10-CM | POA: Diagnosis not present

## 2017-07-10 DIAGNOSIS — N486 Induration penis plastica: Secondary | ICD-10-CM | POA: Diagnosis not present

## 2017-07-10 DIAGNOSIS — M25612 Stiffness of left shoulder, not elsewhere classified: Secondary | ICD-10-CM | POA: Diagnosis not present

## 2017-07-10 DIAGNOSIS — Z8546 Personal history of malignant neoplasm of prostate: Secondary | ICD-10-CM | POA: Diagnosis not present

## 2017-07-10 DIAGNOSIS — M25512 Pain in left shoulder: Secondary | ICD-10-CM | POA: Insufficient documentation

## 2017-07-10 DIAGNOSIS — N529 Male erectile dysfunction, unspecified: Secondary | ICD-10-CM | POA: Diagnosis not present

## 2017-07-10 NOTE — Therapy (Signed)
[address] [place], [place], [address] Phone: [phone]   Fax:  [phone]  Physical Therapy Treatment  Patient Details  Name: James [name] MRN: [id number] Date of Birth: 03-09-1946 Referring Provider: Onnie Graham  Encounter Date: 07/10/2017      PT End of Session - 07/10/17 0919    Visit Number 9   Date for PT Re-Evaluation 07/27/17   PT Start Time 0843   PT Stop Time 0940   PT Time Calculation (min) 57 min   Activity Tolerance Patient tolerated treatment well   Behavior During Therapy [facility] for tasks assessed/performed      Past Medical History:  Diagnosis Date  . Abnormal EKG    NS ST-T EKG Changes, (-) Nuclear Stress Test 03/2006  . Allergy   . Anemia    iron deficency  . Arthritis   . B12 deficiency 06/05/2015   B12 Low NL at 271 methtylmalonic acid high  . Cardiac murmur    nothing to be concerned with   . Cataract    extractions  . Duodenal ulcer 1972   transfusion required, 1 unit packed cells  . Dysphagia    history throat/stomach  cancer "can swallow some, uses more pureed type foods"  . Dyspnea    at times  . Esophageal reflux    no currlently  . Gastric cancer ([place]) 07/18/15   invasive adenocarcinoma  . Gastrostomy tube in place [facility]) 2016   intermittent feeding  . Hx of basal cell carcinoma   . Hyperlipemia    no per pt 04-11-16  . Melanoma ([place])   . Prostate cancer ([place]) 09/06/14   last radiation 4'16   . S/P radiation therapy 11/29/2014 through 01/23/2015   Prostate 7800 cGy in 40 sessions, seminal vesicles 5600 cGy in 40 sessions   . Skin cancer, basal cell   . Transfusion history    '72 -s/p surgery for duodenal ulcer, 12/17    Past Surgical History:  Procedure Laterality Date  . BALLOON DILATION N/A 09/12/2015   Procedure: BALLOON DILATION;  Surgeon: Gatha Mayer, MD;  [facility];   Service: Endoscopy;  Laterality: N/A;  . BALLOON DILATION N/A 09/21/2015   Procedure: BALLOON DILATION;  Surgeon: Gatha Mayer, MD;  [facility];  Service: Endoscopy;  Laterality: N/A;  . BALLOON DILATION N/A 10/04/2015   Procedure: BALLOON DILATION;  Surgeon: Milus Banister, MD;  [facility];  Service: Endoscopy;  Laterality: N/A;  . BALLOON DILATION N/A 12/10/2015   Procedure: BALLOON DILATION;  Surgeon: Gatha Mayer, MD;  [facility];  Service: Endoscopy;  Laterality: N/A;  . BALLOON DILATION N/A 10/10/2016   Procedure: BALLOON DILATION;  Surgeon: Gatha Mayer, MD;  [facility];  Service: Endoscopy;  Laterality: N/A;  . CATARACT EXTRACTION, BILATERAL Bilateral   . COLONOSCOPY    . colonoscopy with polypectomy  02/2012   Dr Sharlett Iles  . ESOPHAGOGASTRODUODENOSCOPY (EGD) WITH PROPOFOL N/A 08/17/2015   Procedure: ESOPHAGOGASTRODUODENOSCOPY (EGD) WITH PROPOFOL;  Surgeon: Gatha Mayer, MD;  Location: [place];  Service: Endoscopy;  Laterality: N/A;  . ESOPHAGOGASTRODUODENOSCOPY (EGD) WITH PROPOFOL N/A 09/12/2015   Procedure: ESOPHAGOGASTRODUODENOSCOPY (EGD) WITH PROPOFOL;  Surgeon: Gatha Mayer, MD;  [facility];  Service: Endoscopy;  Laterality: N/A;  . ESOPHAGOGASTRODUODENOSCOPY (EGD) WITH PROPOFOL N/A 09/21/2015   Procedure: ESOPHAGOGASTRODUODENOSCOPY (EGD) WITH PROPOFOL;  Surgeon: Gatha Mayer, MD;  [facility];  Service: Endoscopy;  Laterality:  N/A;  . ESOPHAGOGASTRODUODENOSCOPY (EGD) WITH PROPOFOL N/A 10/04/2015   Procedure: ESOPHAGOGASTRODUODENOSCOPY (EGD) WITH PROPOFOL;  Surgeon: Milus Banister, MD;  [facility];  Service: Endoscopy;  Laterality: N/A;  possible stricture  . ESOPHAGOGASTRODUODENOSCOPY (EGD) WITH PROPOFOL N/A 12/10/2015   Procedure: ESOPHAGOGASTRODUODENOSCOPY (EGD) WITH PROPOFOL;  Surgeon: Gatha Mayer, MD;  [facility];  Service: Endoscopy;  Laterality: N/A;  .  ESOPHAGOGASTRODUODENOSCOPY (EGD) WITH PROPOFOL N/A 02/06/2016   Procedure: ESOPHAGOGASTRODUODENOSCOPY (EGD) WITH PROPOFOL;  Surgeon: Gatha Mayer, MD;  [facility];  Service: Endoscopy;  Laterality: N/A;  . ESOPHAGOGASTRODUODENOSCOPY (EGD) WITH PROPOFOL N/A 10/10/2016   Procedure: ESOPHAGOGASTRODUODENOSCOPY (EGD) WITH PROPOFOL;  Surgeon: Gatha Mayer, MD;  [facility];  Service: Endoscopy;  Laterality: N/A;  needs APC   . EUS N/A 06/14/2015   Procedure: ESOPHAGEAL ENDOSCOPIC ULTRASOUND (EUS) RADIAL;  Surgeon: Milus Banister, MD;  [facility];  Service: Endoscopy;  Laterality: N/A;  . HOT HEMOSTASIS N/A 10/10/2016   Procedure: HOT HEMOSTASIS (ARGON PLASMA COAGULATION/BICAP);  Surgeon: Gatha Mayer, MD;  [facility];  Service: Endoscopy;  Laterality: N/A;  . KNEE ARTHROSCOPY Right    Right Knee, [place] [facility]  . LAPAROSCOPIC GASTRECTOMY N/A 07/18/2015   Procedure: SUB TOTAL GASTRECTOMY;  Surgeon: Stark Klein, MD;  [facility];  Service: General;  Laterality: N/A;  . LAPAROSCOPIC GASTROSTOMY N/A 07/18/2015   Procedure: FEEDING TUBE PLACEMENT;  Surgeon: Stark Klein, MD;  [facility];  Service: General;  Laterality: N/A;  . LAPAROSCOPY N/A 07/18/2015   Procedure: LAPAROSCOPY DIAGNOSTIC;  Surgeon: Stark Klein, MD;  [facility];  Service: General;  Laterality: N/A;  . MELANOMA EXCISION  02-01-16   abdominal wall  . PROSTATE BIOPSY  09/06/14  . ROTATOR CUFF REPAIR Right    R shoulder, [place]  . SAVORY DILATION N/A 02/06/2016   Procedure: SAVORY DILATION;  Surgeon: Gatha Mayer, MD;  [facility];  Service: Endoscopy;  Laterality: N/A;  . TOTAL SHOULDER ARTHROPLASTY Left 05/07/2017   Procedure: LEFT REVERSE TOTAL SHOULDER ARTHROPLASTY;  Surgeon: Justice Britain, MD;  Location: [place];  Service: Orthopedics;  Laterality: Left;  . TRIGGER FINGER RELEASE Left 1998  . UPPER GASTROINTESTINAL ENDOSCOPY     x14  . VASECTOMY      There were  no vitals filed for this visit.      Subjective Assessment - 07/10/17 0844    Subjective Every no and then I will get pain, not doing as much exercises   Currently in Pain? Yes   Pain Score 1    Pain Location Shoulder   Pain Orientation Left   Aggravating Factors  certain activities                         [facility] Adult PT Treatment/Exercise - 07/10/17 0001      Shoulder Exercises: Standing   External Rotation 20 reps;Theraband   Theraband Level (Shoulder External Rotation) Level 1 (Yellow)   Internal Rotation 20 reps;Theraband   Theraband Level (Shoulder Internal Rotation) Level 1 (Yellow)     Shoulder Exercises: ROM/Strengthening   UBE (Upper Arm Bike) constant work 30 watts x 5 minutes   Cybex Row 2 plate   Cybex Row Limitations 2x15   Wall Wash 2# flexion, circles and scaption   Wall Pushups 20 reps   "W" Arms 20 reps   Other ROM/Strengthening Exercises Wand exercises, ball passes around waist, 2# bent over row, 2# bent  over extension   Other ROM/Strengthening Exercises 15# triceps, 2# biceps, money exercise, lat pulls 20#, chest press 10#     Shoulder Exercises: Power Warden/ranger Exercises over head carry 2# x 150 feet     Vasopneumatic   Number Minutes Vasopneumatic  15 minutes   Vasopnuematic Location  Shoulder   Vasopneumatic Pressure Medium   Vasopneumatic Temperature  33     Manual Therapy   Manual Therapy Passive ROM   Passive ROM left shoulder ER 45, abduction 90, and flexion to 115 degree limits per protocol                  PT Short Term Goals - 06/05/17 0831      PT SHORT TERM GOAL #1   Title independent with initial HEP   Status Achieved           PT Long Term Goals - 07/10/17 0925      PT LONG TERM GOAL #1   Title return to playing tennis 1x/week   Status On-going     PT LONG TERM GOAL #2   Title Increase UE 4/5   Status Partially Met     PT LONG TERM GOAL #3   Title increase AROM of left  shoulder flexion to 110 degrees   Status Partially Met     PT LONG TERM GOAL #4   Title decrease pain 50%   Status Achieved     PT LONG TERM GOAL #5   Title dress and do hair without difficulty   Status Achieved               Plan - 07/10/17 0923    Clinical Impression Statement Continues to have anterior shoulder tightness, overall doing very well.  He has very painful and decreased ROM for IR.   PT Next Visit Plan he will be on vacation the next two weeks, he is to focus on his ROM and stretching   Consulted and Agree with Plan of Care Patient      Patient will benefit from skilled therapeutic intervention in order to improve the following deficits and impairments:  Decreased strength, Increased edema, Postural dysfunction, Decreased scar mobility, Pain, Increased muscle spasms, Impaired UE functional use, Decreased range of motion  Visit Diagnosis: Acute pain of left shoulder  Stiffness of left shoulder, not elsewhere classified  Localized swelling, mass and lump, left upper limb     Problem List Patient Active Problem List   Diagnosis Date Noted  . Nonintractable headache 07/08/2017  . 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 Gastric AVM   . Thrombocytopenia ([place]) 05/24/2016  . Malignant melanoma ([place]) 01/08/2016  . Acute esophagitis   . Dysphagia   . Anastomotic stricture of stomach   . Esophageal stricture   . Carcinoma of lesser curvature of stomach ([place]) 07/18/2015  . Gastric cancer ([place]) 06/08/2015  . B12 deficiency 06/05/2015  . Prostate cancer ([place]) 10/24/2014  . Unspecified vitamin D deficiency 06/03/2014  . History of transfusion 12/16/2013  . Basal cell cancer 09/17/2012  . HEARING DEFICIT 12/21/2009  . NONSPECIFIC ABNORMAL ELECTROCARDIOGRAM 12/21/2009  . History of colonic polyps 12/04/2008  . HYPERLIPIDEMIA 08/23/2007    Sumner Boast., PT 07/10/2017, 9:26 AM  [place] [address] [facility] [place], [place], [address] Phone: [phone]   Fax:  [phone]  Name: James [name] MRN: [id number] Date of Birth: 02/14/1946

## 2017-07-27 ENCOUNTER — Encounter: Payer: Self-pay | Admitting: Physical Therapy

## 2017-07-27 ENCOUNTER — Ambulatory Visit: Payer: Medicare Other | Admitting: Physical Therapy

## 2017-07-27 DIAGNOSIS — M25612 Stiffness of left shoulder, not elsewhere classified: Secondary | ICD-10-CM

## 2017-07-27 DIAGNOSIS — M25512 Pain in left shoulder: Secondary | ICD-10-CM

## 2017-07-27 DIAGNOSIS — R2232 Localized swelling, mass and lump, left upper limb: Secondary | ICD-10-CM | POA: Diagnosis not present

## 2017-07-27 NOTE — Therapy (Signed)
[address] [place], [place], [address] Phone: [phone]   Fax:  [phone]  Physical Therapy Treatment  Patient Details  Name: James [name] MRN: [id number] Date of Birth: [birth year] Referring Provider: Onnie Graham  Encounter Date: 07/27/2017      PT End of Session - 07/27/17 0925    Visit Number 10   Date for PT Re-Evaluation 08/27/17   PT Start Time 0843   PT Stop Time 0933   PT Time Calculation (min) 50 min   Activity Tolerance Patient tolerated treatment well   Behavior During Therapy [facility] for tasks assessed/performed      Past Medical History:  Diagnosis Date  . Abnormal EKG    NS ST-T EKG Changes, (-) Nuclear Stress Test 03/2006  . Allergy   . Anemia    iron deficency  . Arthritis   . B12 deficiency 06/05/2015   B12 Low NL at 271 methtylmalonic acid high  . Cardiac murmur    nothing to be concerned with   . Cataract    extractions  . Duodenal ulcer 1972   transfusion required, 1 unit packed cells  . Dysphagia    history throat/stomach  cancer "can swallow some, uses more pureed type foods"  . Dyspnea    at times  . Esophageal reflux    no currlently  . Gastric cancer ([place]) 07/18/15   invasive adenocarcinoma  . Gastrostomy tube in place [facility]) 2016   intermittent feeding  . Hx of basal cell carcinoma   . Hyperlipemia    no per pt 04-11-16  . Melanoma ([place])   . Prostate cancer ([place]) 09/06/14   last radiation 4'16   . S/P radiation therapy 11/29/2014 through 01/23/2015   Prostate 7800 cGy in 40 sessions, seminal vesicles 5600 cGy in 40 sessions   . Skin cancer, basal cell   . Transfusion history    '72 -s/p surgery for duodenal ulcer, 12/17    Past Surgical History:  Procedure Laterality Date  . BALLOON DILATION N/A 09/12/2015   Procedure: BALLOON DILATION;  Surgeon: Gatha Mayer, MD;  [facility];   Service: Endoscopy;  Laterality: N/A;  . BALLOON DILATION N/A 09/21/2015   Procedure: BALLOON DILATION;  Surgeon: Gatha Mayer, MD;  [facility];  Service: Endoscopy;  Laterality: N/A;  . BALLOON DILATION N/A 10/04/2015   Procedure: BALLOON DILATION;  Surgeon: Milus Banister, MD;  [facility];  Service: Endoscopy;  Laterality: N/A;  . BALLOON DILATION N/A 12/10/2015   Procedure: BALLOON DILATION;  Surgeon: Gatha Mayer, MD;  [facility];  Service: Endoscopy;  Laterality: N/A;  . BALLOON DILATION N/A 10/10/2016   Procedure: BALLOON DILATION;  Surgeon: Gatha Mayer, MD;  [facility];  Service: Endoscopy;  Laterality: N/A;  . CATARACT EXTRACTION, BILATERAL Bilateral   . COLONOSCOPY    . colonoscopy with polypectomy  02/2012   Dr Sharlett Iles  . ESOPHAGOGASTRODUODENOSCOPY (EGD) WITH PROPOFOL N/A 08/17/2015   Procedure: ESOPHAGOGASTRODUODENOSCOPY (EGD) WITH PROPOFOL;  Surgeon: Gatha Mayer, MD;  Location: [place];  Service: Endoscopy;  Laterality: N/A;  . ESOPHAGOGASTRODUODENOSCOPY (EGD) WITH PROPOFOL N/A 09/12/2015   Procedure: ESOPHAGOGASTRODUODENOSCOPY (EGD) WITH PROPOFOL;  Surgeon: Gatha Mayer, MD;  [facility];  Service: Endoscopy;  Laterality: N/A;  . ESOPHAGOGASTRODUODENOSCOPY (EGD) WITH PROPOFOL N/A 09/21/2015   Procedure: ESOPHAGOGASTRODUODENOSCOPY (EGD) WITH PROPOFOL;  Surgeon: Gatha Mayer, MD;  [facility];  Service: Endoscopy;  Laterality:  N/A;  . ESOPHAGOGASTRODUODENOSCOPY (EGD) WITH PROPOFOL N/A 10/04/2015   Procedure: ESOPHAGOGASTRODUODENOSCOPY (EGD) WITH PROPOFOL;  Surgeon: Milus Banister, MD;  [facility];  Service: Endoscopy;  Laterality: N/A;  possible stricture  . ESOPHAGOGASTRODUODENOSCOPY (EGD) WITH PROPOFOL N/A 12/10/2015   Procedure: ESOPHAGOGASTRODUODENOSCOPY (EGD) WITH PROPOFOL;  Surgeon: Gatha Mayer, MD;  [facility];  Service: Endoscopy;  Laterality: N/A;  .  ESOPHAGOGASTRODUODENOSCOPY (EGD) WITH PROPOFOL N/A 02/06/2016   Procedure: ESOPHAGOGASTRODUODENOSCOPY (EGD) WITH PROPOFOL;  Surgeon: Gatha Mayer, MD;  [facility];  Service: Endoscopy;  Laterality: N/A;  . ESOPHAGOGASTRODUODENOSCOPY (EGD) WITH PROPOFOL N/A 10/10/2016   Procedure: ESOPHAGOGASTRODUODENOSCOPY (EGD) WITH PROPOFOL;  Surgeon: Gatha Mayer, MD;  [facility];  Service: Endoscopy;  Laterality: N/A;  needs APC   . EUS N/A 06/14/2015   Procedure: ESOPHAGEAL ENDOSCOPIC ULTRASOUND (EUS) RADIAL;  Surgeon: Milus Banister, MD;  [facility];  Service: Endoscopy;  Laterality: N/A;  . HOT HEMOSTASIS N/A 10/10/2016   Procedure: HOT HEMOSTASIS (ARGON PLASMA COAGULATION/BICAP);  Surgeon: Gatha Mayer, MD;  [facility];  Service: Endoscopy;  Laterality: N/A;  . KNEE ARTHROSCOPY Right    Right Knee, [place] [facility]  . LAPAROSCOPIC GASTRECTOMY N/A 07/18/2015   Procedure: SUB TOTAL GASTRECTOMY;  Surgeon: Stark Klein, MD;  [facility];  Service: General;  Laterality: N/A;  . LAPAROSCOPIC GASTROSTOMY N/A 07/18/2015   Procedure: FEEDING TUBE PLACEMENT;  Surgeon: Stark Klein, MD;  [facility];  Service: General;  Laterality: N/A;  . LAPAROSCOPY N/A 07/18/2015   Procedure: LAPAROSCOPY DIAGNOSTIC;  Surgeon: Stark Klein, MD;  [facility];  Service: General;  Laterality: N/A;  . MELANOMA EXCISION  02-01-16   abdominal wall  . PROSTATE BIOPSY  09/06/14  . ROTATOR CUFF REPAIR Right    R shoulder, [place]  . SAVORY DILATION N/A 02/06/2016   Procedure: SAVORY DILATION;  Surgeon: Gatha Mayer, MD;  [facility];  Service: Endoscopy;  Laterality: N/A;  . TOTAL SHOULDER ARTHROPLASTY Left 05/07/2017   Procedure: LEFT REVERSE TOTAL SHOULDER ARTHROPLASTY;  Surgeon: Justice Britain, MD;  Location: [facility];  Service: Orthopedics;  Laterality: Left;  . TRIGGER FINGER RELEASE Left 1998  . UPPER GASTROINTESTINAL ENDOSCOPY     x14  . VASECTOMY      There were  no vitals filed for this visit.      Subjective Assessment - 07/27/17 0846    Subjective Patietn has been on vacation.  Reports some soreness after loading and unloading car   Currently in Pain? No/denies            [facility] PT Assessment - 07/27/17 0001      AROM   Right/Left Shoulder Left   Left Shoulder Flexion 118 Degrees   Left Shoulder ABduction 115 Degrees   Left Shoulder Internal Rotation 30 Degrees   Left Shoulder External Rotation 62 Degrees                     [facility] Adult PT Treatment/Exercise - 07/27/17 0001      Shoulder Exercises: Standing   External Rotation 20 reps;Theraband   Theraband Level (Shoulder External Rotation) Level 2 (Red)   Internal Rotation 20 reps;Theraband   Theraband Level (Shoulder Internal Rotation) Level 2 (Red)   ABduction 20 reps;Theraband   Theraband Level (Shoulder ABduction) Level 2 (Red)   Extension 20 reps;Theraband   Theraband Level (Shoulder Extension) Level 3 (Green)   Other Standing Exercises ball throwing 3 ways, small  golf swings    Other Standing Exercises Upper cuts and hitchhikers x 20 each, wand exercises for flexion, ER and abduction, 1# bicep curls, 1# shrugs, 3# bent over rows and reverse flies     Shoulder Exercises: ROM/Strengthening   UBE (Upper Arm Bike) constant work 30 watts x 5 minutes   Cybex Row 2.5 plate   Cybex Row Limitations 2x15   Wall Pushups 20 reps   "W" Arms 20 reps   Other ROM/Strengthening Exercises Wand exercises, ball passes around waist, 2# bent over row, 2# bent over extension   Other ROM/Strengthening Exercises 15# triceps, 2# biceps, money exercise, lat pulls 25#, chest press 10#     Manual Therapy   Manual Therapy Passive ROM   Passive ROM left shoulder all motions to pain                  PT Short Term Goals - 06/05/17 0831      PT SHORT TERM GOAL #1   Title independent with initial HEP   Status Achieved           PT Long Term Goals - 08-20-17 0926       PT LONG TERM GOAL #1   Title return to playing tennis 1x/week   Status On-going     PT LONG TERM GOAL #2   Title Increase UE 4/5   Status Partially Met     PT LONG TERM GOAL #3   Title increase AROM of left shoulder flexion to 110 degrees   Status Achieved               Plan - 08/20/2017 0925    Clinical Impression Statement Patient overall is doing very well, he was on vacation the past 10 days and his ROM is continuing to improve, his goals are to play golf and tennis.  Has limitations with IR, some weakness with over head or reaching out.   PT Next Visit Plan We will continue 1x/week to maximize ROM and function   Consulted and Agree with Plan of Care Patient      Patient will benefit from skilled therapeutic intervention in order to improve the following deficits and impairments:  Decreased strength, Increased edema, Postural dysfunction, Decreased scar mobility, Pain, Increased muscle spasms, Impaired UE functional use, Decreased range of motion  Visit Diagnosis: Acute pain of left shoulder - Plan: PT plan of care cert/re-cert  Stiffness of left shoulder, not elsewhere classified - Plan: PT plan of care cert/re-cert       G-Codes - 08/20/2017 0927    Functional Assessment Tool Used (Outpatient Only) foto 43% limitation   Functional Limitation Carrying, moving and handling objects   Carrying, Moving and Handling Objects Current Status (S0630) At least 40 percent but less than 60 percent impaired, limited or restricted   Carrying, Moving and Handling Objects Goal Status (Z6010) At least 40 percent but less than 60 percent impaired, limited or restricted      Problem List Patient Active Problem List   Diagnosis Date Noted  . Nonintractable headache 07/08/2017  . 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 Gastric AVM   . Thrombocytopenia ([place]) 05/24/2016  . Malignant melanoma ([place]) 01/08/2016  . Acute esophagitis   . Dysphagia   . Anastomotic  stricture of stomach   . Esophageal stricture   . 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 Gastric cancer ([place]) 06/08/2015  . B12 deficiency 06/05/2015  . Prostate cancer ([place])  10/24/2014  . Unspecified vitamin D deficiency 06/03/2014  . History of transfusion 12/16/2013  . Basal cell cancer 09/17/2012  . HEARING DEFICIT 12/21/2009  . NONSPECIFIC ABNORMAL ELECTROCARDIOGRAM 12/21/2009  . History of colonic polyps 12/04/2008  . HYPERLIPIDEMIA 08/23/2007    Sumner Boast., PT 07/27/2017, 9:29 AM  [place] [address] [facility] [place], [place], [address] Phone: [phone]   Fax:  [phone]  Name: YOUSAF SAINATO MRN: [id number] Date of Birth: July 18, 1946

## 2017-08-03 ENCOUNTER — Telehealth: Payer: Self-pay

## 2017-08-03 ENCOUNTER — Ambulatory Visit (HOSPITAL_BASED_OUTPATIENT_CLINIC_OR_DEPARTMENT_OTHER): Payer: Medicare Other | Admitting: Oncology

## 2017-08-03 ENCOUNTER — Other Ambulatory Visit (HOSPITAL_BASED_OUTPATIENT_CLINIC_OR_DEPARTMENT_OTHER): Payer: Medicare Other

## 2017-08-03 VITALS — BP 119/70 | HR 58 | Temp 98.1°F | Resp 20 | Ht 66.0 in | Wt 153.2 lb

## 2017-08-03 DIAGNOSIS — E538 Deficiency of other specified B group vitamins: Secondary | ICD-10-CM | POA: Diagnosis not present

## 2017-08-03 DIAGNOSIS — C165 Malignant neoplasm of lesser curvature of stomach, unspecified: Secondary | ICD-10-CM

## 2017-08-03 DIAGNOSIS — C160 Malignant neoplasm of cardia: Secondary | ICD-10-CM | POA: Diagnosis not present

## 2017-08-03 LAB — CBC WITH DIFFERENTIAL/PLATELET
BASO%: 1 % (ref 0.0–2.0)
BASOS ABS: 0 10*3/uL (ref 0.0–0.1)
EOS ABS: 0.1 10*3/uL (ref 0.0–0.5)
EOS%: 5.3 % (ref 0.0–7.0)
HCT: 38.8 % (ref 38.4–49.9)
HEMOGLOBIN: 13.1 g/dL (ref 13.0–17.1)
LYMPH%: 27 % (ref 14.0–49.0)
MCH: 32.5 pg (ref 27.2–33.4)
MCHC: 33.8 g/dL (ref 32.0–36.0)
MCV: 96.2 fL (ref 79.3–98.0)
MONO#: 0.4 10*3/uL (ref 0.1–0.9)
MONO%: 13.1 % (ref 0.0–14.0)
NEUT#: 1.5 10*3/uL (ref 1.5–6.5)
NEUT%: 53.6 % (ref 39.0–75.0)
Platelets: 120 10*3/uL — ABNORMAL LOW (ref 140–400)
RBC: 4.04 10*6/uL — ABNORMAL LOW (ref 4.20–5.82)
RDW: 14.3 % (ref 11.0–14.6)
WBC: 2.7 10*3/uL — ABNORMAL LOW (ref 4.0–10.3)
lymph#: 0.7 10*3/uL — ABNORMAL LOW (ref 0.9–3.3)

## 2017-08-03 NOTE — Progress Notes (Signed)
  [facility] OFFICE PROGRESS NOTE   Diagnosis: Gastric cancer  INTERVAL HISTORY:   James [name] returns as scheduled. He feels well. Good appetite and energy level. He continues to have intermittent solid dysphagia. He underwent a left shoulder arthroplasty 05/07/2017. He continues physical therapy. He is scheduled to undergo a screening colonoscopy tomorrow. No bleeding.   Objective:  Vital signs in last 24 hours:  Blood pressure 119/70, pulse (!) 58, temperature 98.1 F (36.7 C), temperature source Oral, resp. rate 20, height 5\' 6"  (1.676 m), weight 153 lb 3.2 oz (69.5 kg), SpO2 100 %.    HEENT: Neck without mass Lymphatics: No cervical, supraclavicular, or axillary nodes Resp: Lungs clear bilaterally Cardio: Regular rate and rhythm GI: No hepatomegaly, no mass, tender in the subxiphoid region Vascular: No leg edema   Lab Results:  Lab Results  Component Value Date   WBC 2.7 (L) 08/03/2017   HGB 13.1 08/03/2017   HCT 38.8 08/03/2017   MCV 96.2 08/03/2017   PLT 120 (L) 08/03/2017   NEUTROABS 1.5 08/03/2017     Medications: I have reviewed the patient's current medications.  Assessment/Plan: 1. Gastric cancer-adenocarcinoma of the gastric fundus/cardia ? Staging CT scans 06/07/2015 with no evidence of distant metastatic disease, tiny nonspecific pulmonary nodules and liver lesions-likely benign ? Subtotal gastrectomy and placement of a jejunostomy feeding tube 07/18/2015 for a T2,N0 tumor with negative surgical margins ? Initiation of adjuvant 5-FU/leucovorin 08/28/2015, status post 3 weekly treatments completed 09/13/2015 ? Initiation of radiation and concurrent Xeloda 10/29/2015 ? 11/26/2015 Xeloda dose reduced to 1000 mg twice daily due to progressive thrombocytopenia ? Radiation completed 12/06/2015 ? 5-FU/leucovorin resumed 12/21/2015   2. History of microcytic anemia-likely iron deficiency anemia secondary to #1  Progressive anemia  09/16/2016-secondary to 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  Upper endoscopy 10/02/2016 with findings of multiple small bleeding angioectasiasstatus post argon plasma coagulation on 10/10/2016  3. Vitamin B-12 deficiency-Low-level confirmed 01/04/2016, maintained on oral vitamin B-12 replacement  4. 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 Anastomotic stricture-status post balloon dilatation 08/22/2015, status post electroincision dilation at [facility] 10/11/2015, repeat EGD/ dilatation by Dr. Carlean Purl 11/01/2015 and 12/10/2015; EGD 10/02/2016 with finding of esophageal stenosis at the gastroesophageal junction status post dilation 10/10/2016  7. Mild neutropenia/thrombocytopenia secondary to chemotherapy. Progressive thrombocytopenia 11/26/2015. Xeloda dose reduced to 1000 mg twice daily. Counts stable to improved 01/04/2016.  Persistent mild thrombocytopenia  8.Erythema/tenderness at the jejunostomy feeding tube site 09/20/2015, improved  9. Feeding tube removed 01/04/2016  10. Melanoma left lower abdominal wall status post wide excision.    Disposition:  [name] remains in clinical remission from gastric cancer. He is stable from a hematologic standpoint. He will continue iron and vitamin B-12. He will return for an office visit in 6 months.  15 minutes were spent with the patient today. The majority of the time was used for counseling and coordination of care.  James Romberg, MD  08/03/2017  8:31 AM

## 2017-08-03 NOTE — Telephone Encounter (Signed)
Spoke with patient concerning upcoming appointment for 02/01/18 per MD request. Per 10/29 los

## 2017-08-04 ENCOUNTER — Encounter: Payer: Self-pay | Admitting: Internal Medicine

## 2017-08-04 ENCOUNTER — Ambulatory Visit (AMBULATORY_SURGERY_CENTER): Payer: Medicare Other | Admitting: Internal Medicine

## 2017-08-04 VITALS — BP 100/61 | HR 53 | Temp 97.8°F | Resp 11 | Ht 66.0 in | Wt 152.0 lb

## 2017-08-04 DIAGNOSIS — Z86010 Personal history of colonic polyps: Secondary | ICD-10-CM | POA: Diagnosis not present

## 2017-08-04 DIAGNOSIS — D120 Benign neoplasm of cecum: Secondary | ICD-10-CM | POA: Diagnosis not present

## 2017-08-04 DIAGNOSIS — D129 Benign neoplasm of anus and anal canal: Secondary | ICD-10-CM

## 2017-08-04 DIAGNOSIS — D128 Benign neoplasm of rectum: Secondary | ICD-10-CM

## 2017-08-04 DIAGNOSIS — K621 Rectal polyp: Secondary | ICD-10-CM | POA: Diagnosis not present

## 2017-08-04 MED ORDER — SODIUM CHLORIDE 0.9 % IV SOLN: 500.0000 mL | INTRAVENOUS | Status: DC

## 2017-08-04 NOTE — Op Note (Signed)
[place] Patient Name: James [name] Procedure Date: 08/04/2017 11:28 AM MRN: [id number] Endoscopist: Gatha Mayer , MD Age: 71 Referring MD:  Date of Birth: 10-05-1946 Gender: Male Account #: [id number] Procedure:                Colonoscopy Indications:              Surveillance: Personal history of adenomatous                            polyps on last colonoscopy 5 years ago Medicines:                Propofol per Anesthesia, Monitored Anesthesia Care Procedure:                Pre-Anesthesia Assessment:                           - Prior to the procedure, a History and Physical                            was performed, and patient medications and                            allergies were reviewed. The patient's tolerance of                            previous anesthesia was also reviewed. The risks                            and benefits of the procedure and the sedation                            options and risks were discussed with the patient.                            All questions were answered, and informed consent                            was obtained. Prior Anticoagulants: The patient has                            taken no previous anticoagulant or antiplatelet                            agents. ASA Grade Assessment: II - A patient with                            mild systemic disease. After reviewing the risks                            and benefits, the patient was deemed in                            satisfactory condition to undergo the procedure.  After obtaining informed consent, the colonoscope                            was passed under direct vision. Throughout the                            procedure, the patient's blood pressure, pulse, and                            oxygen saturations were monitored continuously. The                            Colonoscope was introduced through the anus and   advanced to the the cecum, identified by                            appendiceal orifice and ileocecal valve. The                            colonoscopy was performed without difficulty. The                            patient tolerated the procedure well. The quality                            of the bowel preparation was excellent. The bowel                            preparation used was Miralax. The ileocecal valve,                            appendiceal orifice, and rectum were photographed. Scope In: 11:46:34 AM Scope Out: 12:06:30 PM Scope Withdrawal Time: 0 hours 17 minutes 4 seconds  Total Procedure Duration: 0 hours 19 minutes 56 seconds  Findings:                 The perianal and digital rectal examinations were                            normal.                           Two sessile polyps were found in the rectum and                            cecum. The polyps were diminutive in size. These                            polyps were removed with a cold snare. Resection                            and retrieval were complete. Verification of                            patient identification for  the specimen was done.                            Estimated blood loss was minimal.                           Multiple small-mouthed diverticula were found in                            the sigmoid colon.                           The mucosa vascular pattern in the rectum was                            locally increased.                           The exam was otherwise without abnormality on                            direct and retroflexion views. Complications:            No immediate complications. Estimated Blood Loss:     Estimated blood loss was minimal. Impression:               - Two diminutive polyps in the rectum and in the                            cecum, removed with a cold snare. Resected and                            retrieved.                           - Diverticulosis in  the sigmoid colon.                           - Increased mucosa vascular pattern in the rectum.                           - The examination was otherwise normal on direct                            and retroflexion views. Recommendation:           - Patient has a contact number available for                            emergencies. The signs and symptoms of potential                            delayed complications were discussed with the                            patient. Return to normal activities tomorrow.  Written discharge instructions were provided to the                            patient.                           - Resume previous diet.                           - Continue present medications.                           - Repeat colonoscopy is recommended. The                            colonoscopy date will be determined after pathology                            results from today's exam become available for                            review. Gatha Mayer, MD 08/04/2017 12:23:15 PM This report has been signed electronically.

## 2017-08-04 NOTE — Progress Notes (Signed)
Spontaneous respirations throughout. VSS. Resting comfortably. To PACU on room air. Report to  RN. 

## 2017-08-04 NOTE — Progress Notes (Signed)
Pt's states no medical or surgical changes since previsit or office visit. 

## 2017-08-04 NOTE — Patient Instructions (Addendum)
I found and removed 2 tiny polyps. Maybe another colonoscopy in 5 years. I will let you know.  Great to see you - let me know if the swallowing worsens and you need another dilation.  I appreciate the opportunity to care for you. Gatha Mayer, MD, FACG YOU HAD AN ENDOSCOPIC PROCEDURE TODAY AT [facility]:   Refer to the procedure report that was given to you for any specific questions about what was found during the examination.  If the procedure report does not answer your questions, please call your gastroenterologist to clarify.  If you requested that your care partner not be given the details of your procedure findings, then the procedure report has been included in a sealed envelope for you to review at your convenience later.  YOU SHOULD EXPECT: Some feelings of bloating in the abdomen. Passage of more gas than usual.  Walking can help get rid of the air that was put into your GI tract during the procedure and reduce the bloating. If you had a lower endoscopy (such as a colonoscopy or flexible sigmoidoscopy) you may notice spotting of blood in your stool or on the toilet paper. If you underwent a bowel prep for your procedure, you may not have a normal bowel movement for a few days.  Please Note:  You might notice some irritation and congestion in your nose or some drainage.  This is from the oxygen used during your procedure.  There is no need for concern and it should clear up in a day or so.  SYMPTOMS TO REPORT IMMEDIATELY:   Following lower endoscopy (colonoscopy or flexible sigmoidoscopy):  Excessive amounts of blood in the stool  Significant tenderness or worsening of abdominal pains  Swelling of the abdomen that is new, acute  Fever of 100F or higher  For urgent or emergent issues, a gastroenterologist can be reached at any hour by calling [phone].   DIET:  We do recommend a small meal at first, but then you may proceed to your regular diet.   Drink plenty of fluids but you should avoid alcoholic beverages for 24 hours.  ACTIVITY:  You should plan to take it easy for the rest of today and you should NOT DRIVE or use heavy machinery until tomorrow (because of the sedation medicines used during the test).    FOLLOW UP: Our staff will call the number listed on your records the next business day following your procedure to check on you and address any questions or concerns that you may have regarding the information given to you following your procedure. If we do not reach you, we will leave a message.  However, if you are feeling well and you are not experiencing any problems, there is no need to return our call.  We will assume that you have returned to your regular daily activities without incident.  If any biopsies were taken you will be contacted by phone or by letter within the next 1-3 weeks.  Please call us at [phone] if you have not heard about the biopsies in 3 weeks.   Polyps (handout given) Await for biopsy results Repeat Colonoscopy screening in 5 years   SIGNATURES/CONFIDENTIALITY: You and/or your care partner have signed paperwork which will be entered into your electronic medical record.  These signatures attest to the fact that that the information above on your After Visit Summary has been reviewed and is understood.  Full responsibility of the confidentiality of this  discharge information lies with you and/or your care-partner. 

## 2017-08-04 NOTE — Progress Notes (Signed)
Called to room for assistance in procedure.

## 2017-08-05 ENCOUNTER — Telehealth: Payer: Self-pay | Admitting: *Deleted

## 2017-08-05 NOTE — Telephone Encounter (Signed)
  Follow up Call-  Call back number 08/04/2017 10/02/2016 04/11/2016 11/01/2015 08/29/2015 08/22/2015 06/05/2015  Post procedure Call Back phone  # [phone]  Permission to leave phone message Yes Yes Yes Yes Yes Yes Yes  Some recent data might be hidden     Patient questions:  Do you have a fever, pain , or abdominal swelling? No. Pain Score  0 *  Have you tolerated food without any problems? Yes.    Have you been able to return to your normal activities? No.  Do you have any questions about your discharge instructions: Diet   No. Medications  No. Follow up visit  No.  Do you have questions or concerns about your Care? No.  Actions: * If pain score is 4 or above: No action needed, pain <4.

## 2017-08-07 ENCOUNTER — Ambulatory Visit: Payer: Medicare Other | Attending: Orthopedic Surgery | Admitting: Physical Therapy

## 2017-08-07 ENCOUNTER — Encounter: Payer: Self-pay | Admitting: Physical Therapy

## 2017-08-07 ENCOUNTER — Encounter: Payer: Self-pay | Admitting: Internal Medicine

## 2017-08-07 DIAGNOSIS — R2232 Localized swelling, mass and lump, left upper limb: Secondary | ICD-10-CM | POA: Diagnosis not present

## 2017-08-07 DIAGNOSIS — M25612 Stiffness of left shoulder, not elsewhere classified: Secondary | ICD-10-CM | POA: Diagnosis not present

## 2017-08-07 DIAGNOSIS — M25512 Pain in left shoulder: Secondary | ICD-10-CM | POA: Insufficient documentation

## 2017-08-07 NOTE — Progress Notes (Signed)
1 adenoma 1 hyperplastic Recall 2023 My Chart letter

## 2017-08-07 NOTE — Therapy (Signed)
[name] [place], [place], [address] Phone: [phone]   Fax:  [phone]  Physical Therapy Treatment  Patient Details  Name: James [name] MRN: [id number] Date of Birth: [birth year] Referring Provider: Onnie Graham  Encounter Date: 08/07/2017      PT End of Session - 08/07/17 0917    Visit Number 11   Date for PT Re-Evaluation 08/27/17   PT Start Time 0845   PT Stop Time 0940   PT Time Calculation (min) 55 min   Activity Tolerance Patient tolerated treatment well   Behavior During Therapy [facility] for tasks assessed/performed      Past Medical History:  Diagnosis Date  . Abnormal EKG    NS ST-T EKG Changes, (-) Nuclear Stress Test 03/2006  . Allergy   . Anemia    iron deficency  . Arthritis   . B12 deficiency 06/05/2015   B12 Low NL at 271 methtylmalonic acid high  . Cardiac murmur    nothing to be concerned with   . Cataract    extractions  . Duodenal ulcer 1972   transfusion required, 1 unit packed cells  . Dysphagia    history throat/stomach  cancer "can swallow some, uses more pureed type foods"  . Dyspnea    at times  . Esophageal reflux    no currlently  . Gastric cancer ([place]) 07/18/15   invasive adenocarcinoma  . Gastrostomy tube in place [facility]) 2016   intermittent feeding  . Hx of basal cell carcinoma   . Hyperlipemia    no per pt 04-11-16  . Melanoma ([place])   . Prostate cancer ([place]) 09/06/14   last radiation 4'16   . S/P radiation therapy 11/29/2014 through 01/23/2015   Prostate 7800 cGy in 40 sessions, seminal vesicles 5600 cGy in 40 sessions   . Skin cancer, basal cell   . Transfusion history    '72 -s/p surgery for duodenal ulcer, 12/17    Past Surgical History:  Procedure Laterality Date  . BALLOON DILATION N/A 09/12/2015   Procedure: BALLOON DILATION;  Surgeon: Gatha Mayer, MD;  Location: [facility];   Service: Endoscopy;  Laterality: N/A;  . BALLOON DILATION N/A 09/21/2015   Procedure: BALLOON DILATION;  Surgeon: Gatha Mayer, MD;  Location: [facility];  Service: Endoscopy;  Laterality: N/A;  . BALLOON DILATION N/A 10/04/2015   Procedure: BALLOON DILATION;  Surgeon: Milus Banister, MD;  Location: [facility];  Service: Endoscopy;  Laterality: N/A;  . BALLOON DILATION N/A 12/10/2015   Procedure: BALLOON DILATION;  Surgeon: Gatha Mayer, MD;  Location: [facility];  Service: Endoscopy;  Laterality: N/A;  . BALLOON DILATION N/A 10/10/2016   Procedure: BALLOON DILATION;  Surgeon: Gatha Mayer, MD;  [facility]: [facility];  Service: Endoscopy;  Laterality: N/A;  . CATARACT EXTRACTION, BILATERAL Bilateral   . COLONOSCOPY    . colonoscopy with polypectomy  02/2012   Dr Sharlett Iles  . ESOPHAGOGASTRODUODENOSCOPY (EGD) WITH PROPOFOL N/A 08/17/2015   Procedure: ESOPHAGOGASTRODUODENOSCOPY (EGD) WITH PROPOFOL;  Surgeon: Gatha Mayer, MD;  Location: [facility];  Service: Endoscopy;  Laterality: N/A;  . ESOPHAGOGASTRODUODENOSCOPY (EGD) WITH PROPOFOL N/A 09/12/2015   Procedure: ESOPHAGOGASTRODUODENOSCOPY (EGD) WITH PROPOFOL;  Surgeon: Gatha Mayer, MD;  [facility];  Service: Endoscopy;  Laterality: N/A;  . ESOPHAGOGASTRODUODENOSCOPY (EGD) WITH PROPOFOL N/A 09/21/2015   Procedure: ESOPHAGOGASTRODUODENOSCOPY (EGD) WITH PROPOFOL;  Surgeon: Gatha Mayer, MD;  [facility];  Service: Endoscopy;  Laterality:  N/A;  . ESOPHAGOGASTRODUODENOSCOPY (EGD) WITH PROPOFOL N/A 10/04/2015   Procedure: ESOPHAGOGASTRODUODENOSCOPY (EGD) WITH PROPOFOL;  Surgeon: Milus Banister, MD;  [facility];  Service: Endoscopy;  Laterality: N/A;  possible stricture  . ESOPHAGOGASTRODUODENOSCOPY (EGD) WITH PROPOFOL N/A 12/10/2015   Procedure: ESOPHAGOGASTRODUODENOSCOPY (EGD) WITH PROPOFOL;  Surgeon: Gatha Mayer, MD;  [facility];  Service: Endoscopy;  Laterality: N/A;  .  ESOPHAGOGASTRODUODENOSCOPY (EGD) WITH PROPOFOL N/A 02/06/2016   Procedure: ESOPHAGOGASTRODUODENOSCOPY (EGD) WITH PROPOFOL;  Surgeon: Gatha Mayer, MD;  [facility];  Service: Endoscopy;  Laterality: N/A;  . ESOPHAGOGASTRODUODENOSCOPY (EGD) WITH PROPOFOL N/A 10/10/2016   Procedure: ESOPHAGOGASTRODUODENOSCOPY (EGD) WITH PROPOFOL;  Surgeon: Gatha Mayer, MD;  [facility];  Service: Endoscopy;  Laterality: N/A;  needs APC   . EUS N/A 06/14/2015   Procedure: ESOPHAGEAL ENDOSCOPIC ULTRASOUND (EUS) RADIAL;  Surgeon: Milus Banister, MD;  [facility];  Service: Endoscopy;  Laterality: N/A;  . HOT HEMOSTASIS N/A 10/10/2016   Procedure: HOT HEMOSTASIS (ARGON PLASMA COAGULATION/BICAP);  Surgeon: Gatha Mayer, MD;  [facility];  Service: Endoscopy;  Laterality: N/A;  . KNEE ARTHROSCOPY Right    Right Knee, [place] [facility]  . LAPAROSCOPIC GASTRECTOMY N/A 07/18/2015   Procedure: SUB TOTAL GASTRECTOMY;  Surgeon: Stark Klein, MD;  [facility];  Service: General;  Laterality: N/A;  . LAPAROSCOPIC GASTROSTOMY N/A 07/18/2015   Procedure: FEEDING TUBE PLACEMENT;  Surgeon: Stark Klein, MD;  [facility];  Service: General;  Laterality: N/A;  . LAPAROSCOPY N/A 07/18/2015   Procedure: LAPAROSCOPY DIAGNOSTIC;  Surgeon: Stark Klein, MD;  [facility]: [facility];  Service: General;  Laterality: N/A;  . MELANOMA EXCISION  02-01-16   abdominal wall  . PROSTATE BIOPSY  09/06/14  . ROTATOR CUFF REPAIR Right    R shoulder, [place]  . SAVORY DILATION N/A 02/06/2016   Procedure: SAVORY DILATION;  Surgeon: Gatha Mayer, MD;  [facility];  Service: Endoscopy;  Laterality: N/A;  . TOTAL SHOULDER ARTHROPLASTY Left 05/07/2017   Procedure: LEFT REVERSE TOTAL SHOULDER ARTHROPLASTY;  Surgeon: Justice Britain, MD;  Location: [facility];  Service: Orthopedics;  Laterality: Left;  . TRIGGER FINGER RELEASE Left 1998  . UPPER GASTROINTESTINAL ENDOSCOPY     x14  . VASECTOMY      There were  no vitals filed for this visit.      Subjective Assessment - 08/07/17 0847    Subjective Patient reports overall doing very well, reports that "every now and then I get some pain", no specific cause.   Currently in Pain? No/denies            [facility] PT Assessment - 08/07/17 0001      AROM   Right/Left Shoulder Left   Left Shoulder Flexion 123 Degrees   Left Shoulder ABduction 120 Degrees   Left Shoulder Internal Rotation 37 Degrees   Left Shoulder External Rotation 70 Degrees     PROM   Left Shoulder Flexion 150 Degrees                     [facility] Adult PT Treatment/Exercise - 08/07/17 0001      Shoulder Exercises: Prone   Retraction Both;20 reps;Weights   Retraction Weight (lbs) 2   External Rotation Left;20 reps;Weights   External Rotation Limitations 2#   Other Prone Exercises rows and flies 2#     Shoulder Exercises: Standing   External Rotation 20 reps;Theraband   Theraband Level (Shoulder External Rotation) Level  2 (Red)   Internal Rotation 20 reps;Theraband   Extension 20 reps;Theraband   Theraband Level (Shoulder Extension) Level 3 (Green)   Other Standing Exercises Upper cuts and hitchhikers x 20 each, wand exercises for flexion, ER and abduction, 1# bicep curls, 1# shrugs, 3# bent over rows and reverse flies     Shoulder Exercises: ROM/Strengthening   UBE (Upper Arm Bike) constant work 30 watts x 5 minutes   Cybex Row 2.5 plate   Cybex Row Limitations 2x15   Wall Pushups 20 reps   "W" Arms 20 reps   X to V Arms 20 reps   Other ROM/Strengthening Exercises wall circles, scaption and flexion 2#   Other ROM/Strengthening Exercises 15# triceps, 2# biceps, money exercise, lat pulls 25#, chest press 10#     Vasopneumatic   Number Minutes Vasopneumatic  15 minutes   Vasopnuematic Location  Shoulder   Vasopneumatic Pressure Medium   Vasopneumatic Temperature  33     Manual Therapy   Manual Therapy Passive ROM   Passive ROM left shoulder all  motions to pain                  PT Short Term Goals - 06/05/17 0831      PT SHORT TERM GOAL #1   Title independent with initial HEP   Status Achieved           PT Long Term Goals - 08/07/17 2197      PT LONG TERM GOAL #1   Title return to playing tennis 1x/week   Status On-going     PT LONG TERM GOAL #2   Title Increase UE 4/5   Status Partially Met     PT LONG TERM GOAL #3   Title increase AROM of left shoulder flexion to 110 degrees   Status Achieved     PT LONG TERM GOAL #4   Title decrease pain 50%   Status Achieved     PT LONG TERM GOAL #5   Title dress and do hair without difficulty   Status Achieved               Plan - 08/07/17 0918    Clinical Impression Statement Patient continues to progress and do well.  His PROM for a reverse total shoulder is amazing.  His AROM is doing well, he does have some compensatory patterns.  IR is his most limited motion and causes some pain in the lateral left upper arm.   PT Next Visit Plan Patient doing very well, reports that he would like to play golf, I could see him a few more times to assure that he progresses to gym safely    Consulted and Agree with Plan of Care Patient      Patient will benefit from skilled therapeutic intervention in order to improve the following deficits and impairments:  Decreased strength, Increased edema, Postural dysfunction, Decreased scar mobility, Pain, Increased muscle spasms, Impaired UE functional use, Decreased range of motion  Visit Diagnosis: Acute pain of left shoulder  Stiffness of left shoulder, not elsewhere classified  Localized swelling, mass and lump, left upper limb     Problem List Patient Active Problem List   Diagnosis Date Noted  . Nonintractable headache 07/08/2017  . 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 Gastric AVM   . Thrombocytopenia ([place]) 05/24/2016  . Malignant melanoma ([place]) 01/08/2016  . Anastomotic stricture of  stomach   . Esophageal stricture   . 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 Gastric cancer ([place]) 06/08/2015  . B12 deficiency 06/05/2015  . Prostate cancer ([place]) 10/24/2014  . Unspecified vitamin D deficiency 06/03/2014  . History of transfusion 12/16/2013  . Basal cell cancer 09/17/2012  . HEARING DEFICIT 12/21/2009  . NONSPECIFIC ABNORMAL ELECTROCARDIOGRAM 12/21/2009  . History of colonic polyps 12/04/2008  . HYPERLIPIDEMIA 08/23/2007    Sumner Boast., PT 08/07/2017, 9:22 AM  [address] [place], [place], [address] Phone: [phone]   Fax:  [phone]  Name: James [name] MRN: [id number] Date of Birth: [birth year]

## 2017-08-10 DIAGNOSIS — Z471 Aftercare following joint replacement surgery: Secondary | ICD-10-CM | POA: Diagnosis not present

## 2017-08-10 DIAGNOSIS — Z96612 Presence of left artificial shoulder joint: Secondary | ICD-10-CM | POA: Diagnosis not present

## 2017-08-14 ENCOUNTER — Ambulatory Visit: Payer: Medicare Other | Admitting: Physical Therapy

## 2017-08-14 ENCOUNTER — Encounter: Payer: Self-pay | Admitting: Physical Therapy

## 2017-08-14 DIAGNOSIS — M25512 Pain in left shoulder: Secondary | ICD-10-CM | POA: Diagnosis not present

## 2017-08-14 DIAGNOSIS — M25612 Stiffness of left shoulder, not elsewhere classified: Secondary | ICD-10-CM | POA: Diagnosis not present

## 2017-08-14 DIAGNOSIS — R2232 Localized swelling, mass and lump, left upper limb: Secondary | ICD-10-CM | POA: Diagnosis not present

## 2017-08-14 NOTE — Therapy (Signed)
[facility] [address] [place], [place], [address] Phone: [phone]   Fax:  [phone]  Physical Therapy Treatment  Patient Details  Name: James [name] MRN: [id number] Date of Birth: October 10, 1945 Referring Provider: Onnie Graham   Encounter Date: 08/14/2017  PT End of Session - 08/14/17 0829    Visit Number  12    Date for PT Re-Evaluation  08/27/17    PT Start Time  0755    PT Stop Time  0836    PT Time Calculation (min)  41 min    Activity Tolerance  Treatment limited secondary to medical complications (Comment)    Behavior During Therapy  [facility] for tasks assessed/performed       Past Medical History:  Diagnosis Date  . Abnormal EKG    NS ST-T EKG Changes, (-) Nuclear Stress Test 03/2006  . Allergy   . Anemia    iron deficency  . Arthritis   . B12 deficiency 06/05/2015   B12 Low NL at 271 methtylmalonic acid high  . Cardiac murmur    nothing to be concerned with   . Cataract    extractions  . Duodenal ulcer 1972   transfusion required, 1 unit packed cells  . Dysphagia    history throat/stomach  cancer "can swallow some, uses more pureed type foods"  . Dyspnea    at times  . Esophageal reflux    no currlently  . Gastric cancer ([place]) 07/18/15   invasive adenocarcinoma  . Gastrostomy tube in place [facility]) 2016   intermittent feeding  . Hx of basal cell carcinoma   . Hyperlipemia    no per pt 04-11-16  . Melanoma ([place])   . Prostate cancer ([place]) 09/06/14   last radiation 4'16   . S/P radiation therapy 11/29/2014 through 01/23/2015   Prostate 7800 cGy in 40 sessions, seminal vesicles 5600 cGy in 40 sessions   . Skin cancer, basal cell   . Transfusion history    '72 -s/p surgery for duodenal ulcer, 12/17    Past Surgical History:  Procedure Laterality Date  . CATARACT EXTRACTION, BILATERAL Bilateral   . COLONOSCOPY    . colonoscopy  with polypectomy  02/2012   Dr Sharlett Iles  . KNEE ARTHROSCOPY Right    Right Knee, [place] [facility]  . MELANOMA EXCISION  02-01-16   abdominal wall  . PROSTATE BIOPSY  09/06/14  . ROTATOR CUFF REPAIR Right    R shoulder, [place]  . TRIGGER FINGER RELEASE Left 1998  . UPPER GASTROINTESTINAL ENDOSCOPY     x14  . VASECTOMY      There were no vitals filed for this visit.  Subjective Assessment - 08/14/17 0800    Subjective  Patient saw MD, REports that he can go play golf.    Currently in Pain?  No/denies                      [facility] Adult PT Treatment/Exercise - 08/14/17 0001      Shoulder Exercises: ROM/Strengthening   UBE (Upper Arm Bike)  constant work 35 watts x 5 minutes    Cybex Row  2.5 plate    Cybex Row Limitations  2x15    Other ROM/Strengthening Exercises  wall circles, scaption and flexion 2#    Other ROM/Strengthening Exercises  15# triceps, 2# biceps, money exercise, lat pulls 25#, chest press 10#  PT Education - 08-30-17 0828    Education provided  Yes    Education Details  Went over advanced HEP and gym program, went over form, reps, sets and frequency, he needed some cues to slow down and for posture    Person(s) Educated  Patient    Methods  Explanation;Demonstration;Tactile cues;Verbal cues    Comprehension  Verbalized understanding;Returned demonstration;Verbal cues required       PT Short Term Goals - 06/05/17 0831      PT SHORT TERM GOAL #1   Title  independent with initial HEP    Status  Achieved        PT Long Term Goals - 2017/08/30 0831      PT LONG TERM GOAL #1   Title  return to playing tennis 1x/week    Status  Achieved      PT LONG TERM GOAL #2   Title  Increase UE 4/5    Status  Achieved      PT LONG TERM GOAL #3   Title  increase AROM of left shoulder flexion to 110 degrees    Status  Achieved      PT LONG TERM GOAL #4   Title  decrease pain 50%    Status  Achieved      PT LONG TERM GOAL #5   Title   dress and do hair without difficulty    Status  Achieved            Plan - 30-Aug-2017 0830    Clinical Impression Statement  Patient overall doing great, great ROM, has played a little golf, feels like he is doing exercises well.      PT Next Visit Plan  Goals met, will D/C    Consulted and Agree with Plan of Care  Patient       Patient will benefit from skilled therapeutic intervention in order to improve the following deficits and impairments:  Decreased strength, Increased edema, Postural dysfunction, Decreased scar mobility, Pain, Increased muscle spasms, Impaired UE functional use, Decreased range of motion  Visit Diagnosis: Acute pain of left shoulder  Stiffness of left shoulder, not elsewhere classified   G-Codes - 08/30/2017 6389    Functional Assessment Tool Used (Outpatient Only)  foto 43% limitation    Carrying, Moving and Handling Objects Current Status (H7342)  At least 40 percent but less than 60 percent impaired, limited or restricted    Carrying, Moving and Handling Objects Goal Status (A7681)  At least 40 percent but less than 60 percent impaired, limited or restricted       Problem List Patient Active Problem List   Diagnosis Date Noted  . Nonintractable headache 07/08/2017  . 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 Gastric AVM   . Thrombocytopenia ([place]) 05/24/2016  . Malignant melanoma ([place]) 01/08/2016  . Anastomotic stricture of stomach   . Esophageal stricture   . Carcinoma of lesser curvature of stomach ([place]) 07/18/2015  . Gastric cancer ([place]) 06/08/2015  . B12 deficiency 06/05/2015  . Prostate cancer ([place]) 10/24/2014  . Unspecified vitamin D deficiency 06/03/2014  . History of transfusion 12/16/2013  . Basal cell cancer 09/17/2012  . HEARING DEFICIT 12/21/2009  . NONSPECIFIC ABNORMAL ELECTROCARDIOGRAM 12/21/2009  . History of colonic polyps 12/04/2008  . HYPERLIPIDEMIA 08/23/2007    [facility] Boast., PT 2017-08-30, 8:34  AM  [place] [address] [facility] [place], [place], [address] Phone: [phone]   Fax:  [phone]  Name: James [name] MRN: [id number] Date of Birth: 06/11/1946

## 2017-08-20 DIAGNOSIS — Z961 Presence of intraocular lens: Secondary | ICD-10-CM | POA: Diagnosis not present

## 2017-12-14 ENCOUNTER — Encounter: Payer: Self-pay | Admitting: Internal Medicine

## 2017-12-14 ENCOUNTER — Ambulatory Visit (INDEPENDENT_AMBULATORY_CARE_PROVIDER_SITE_OTHER): Payer: Medicare Other | Admitting: Internal Medicine

## 2017-12-14 VITALS — BP 118/80 | HR 70 | Ht 66.0 in | Wt 157.0 lb

## 2017-12-14 DIAGNOSIS — R1319 Other dysphagia: Secondary | ICD-10-CM

## 2017-12-14 DIAGNOSIS — R1310 Dysphagia, unspecified: Secondary | ICD-10-CM

## 2017-12-14 DIAGNOSIS — K222 Esophageal obstruction: Secondary | ICD-10-CM

## 2017-12-14 DIAGNOSIS — Z85028 Personal history of other malignant neoplasm of stomach: Secondary | ICD-10-CM | POA: Diagnosis not present

## 2017-12-14 NOTE — Progress Notes (Signed)
James [name] 72 y.o. 1946/09/30 [id number]  Assessment & Plan:   Encounter Diagnoses  Name Primary?  . Esophageal stricture after gastric cancerresection and XRT Yes  . Esophageal dysphagia   . History of gastric cancer s/p reserction      Dysphagia every 2 weeks and probable food impaction 2 mos ago  Will reassess and likely redilate with EGD/balloon dilation. The risks and benefits as well as alternatives of endoscopic procedure(s) have been discussed and reviewed. All questions answered. The patient agrees to proceed.  I appreciate the opportunity to care for this patient.  NU:UVOZD, Claudina Lick, MD    Subjective:   Chief Complaint: dysphagia  HPI Here w/ wife for f/u - had colonoscopy 07/2017 and EGD 10/2016 w/ repeat dilation of post-op GE junction stricture to 20 mm - one of many dilations since 2016 . Had been much better overall - says if eats too fast or things like lettuce will have dysphagia. 2 mos ago had 2-3 d of difficulty swallowing that sounds like he had a food impaction. Otherwise well and active and free of cancer as best we know.  Wt Readings from Last 3 Encounters:  12/14/17 157 lb (71.2 kg)  08/04/17 152 lb (68.9 kg)  08/03/17 153 lb 3.2 oz (69.5 kg)    Allergies  Allergen Reactions  . Codeine Nausea And Vomiting   Current Meds  Medication Sig  . acetaminophen (TYLENOL) 500 MG tablet Take 1,000 mg by mouth every 6 (six) hours as needed for mild pain.   . ferrous sulfate 325 (65 FE) MG tablet Take 1 tablet (325 mg total) by mouth daily with breakfast. (Patient taking differently: Take 325 mg by mouth 2 (two) times daily with a meal. )  . Multiple Vitamin (MULTIVITAMIN WITH MINERALS) TABS tablet Take 1 tablet by mouth every other day.   [name] omeprazole (PRILOSEC) 40 MG capsule take 1 capsule by mouth once daily 30 MINUTES BEFORE BREAKFAST/SUPPER.  OKAY TO OPEN AND SPRINKLE ON APPLESAUCE (Patient taking differently: Take 40 mg by mouth daily. take 1  capsule by mouth once daily 30 MINUTES BEFORE BREAKFAST/SUPPER.  OKAY TO OPEN AND SPRINKLE ON APPLESAUCE)  . pravastatin (PRAVACHOL) 40 MG tablet Take 1 tablet (40 mg total) by mouth at bedtime. Must keep June appt for future refills   Past Medical History:  Diagnosis Date  . Abnormal EKG    NS ST-T EKG Changes, (-) Nuclear Stress Test 03/2006  . Allergy   . Anemia    iron deficency  . Arthritis   . B12 deficiency 06/05/2015   B12 Low NL at 271 methtylmalonic acid high  . Cardiac murmur    nothing to be concerned with   . Cataract    extractions  . Duodenal ulcer 1972   transfusion required, 1 unit packed cells  . Dysphagia    history throat/stomach  cancer "can swallow some, uses more pureed type foods"  . Dyspnea    at times  . Esophageal reflux    no currlently  . Gastric cancer ([place]) 07/18/15   invasive adenocarcinoma  . Gastrostomy tube in place [facility]) 2016   intermittent feeding  . Hx of basal cell carcinoma   . Hyperlipemia    no per pt 04-11-16  . Melanoma ([place])   . Prostate cancer ([place]) 09/06/14   last radiation 4'16   . S/P radiation therapy 11/29/2014 through 01/23/2015   Prostate 7800 cGy in 40 sessions, seminal vesicles 5600 cGy in 40 sessions   .  Skin cancer, basal cell   . Transfusion history    '72 -s/p surgery for duodenal ulcer, 12/17   Past Surgical History:  Procedure Laterality Date  . BALLOON DILATION N/A 09/12/2015   Procedure: BALLOON DILATION;  Surgeon: Gatha Mayer, MD;  Location: [facility];  Service: Endoscopy;  Laterality: N/A;  . BALLOON DILATION N/A 09/21/2015   Procedure: BALLOON DILATION;  Surgeon: Gatha Mayer, MD;  [facility];  Service: Endoscopy;  Laterality: N/A;  . BALLOON DILATION N/A 10/04/2015   Procedure: BALLOON DILATION;  Surgeon: Milus Banister, MD;  [facility]: [facility];  Service: Endoscopy;  Laterality: N/A;  . BALLOON DILATION N/A  12/10/2015   Procedure: BALLOON DILATION;  Surgeon: Gatha Mayer, MD;  [facility];  Service: Endoscopy;  Laterality: N/A;  . BALLOON DILATION N/A 10/10/2016   Procedure: BALLOON DILATION;  Surgeon: Gatha Mayer, MD;  [facility];  Service: Endoscopy;  Laterality: N/A;  . CATARACT EXTRACTION, BILATERAL Bilateral   . COLONOSCOPY    . colonoscopy with polypectomy  02/2012   Dr Sharlett Iles  . ESOPHAGOGASTRODUODENOSCOPY (EGD) WITH PROPOFOL N/A 08/17/2015   Procedure: ESOPHAGOGASTRODUODENOSCOPY (EGD) WITH PROPOFOL;  Surgeon: Gatha Mayer, MD;  Location: [place];  Service: Endoscopy;  Laterality: N/A;  . ESOPHAGOGASTRODUODENOSCOPY (EGD) WITH PROPOFOL N/A 09/12/2015   Procedure: ESOPHAGOGASTRODUODENOSCOPY (EGD) WITH PROPOFOL;  Surgeon: Gatha Mayer, MD;  [facility];  Service: Endoscopy;  Laterality: N/A;  . ESOPHAGOGASTRODUODENOSCOPY (EGD) WITH PROPOFOL N/A 09/21/2015   Procedure: ESOPHAGOGASTRODUODENOSCOPY (EGD) WITH PROPOFOL;  Surgeon: Gatha Mayer, MD;  [facility];  Service: Endoscopy;  Laterality: N/A;  . ESOPHAGOGASTRODUODENOSCOPY (EGD) WITH PROPOFOL N/A 10/04/2015   Procedure: ESOPHAGOGASTRODUODENOSCOPY (EGD) WITH PROPOFOL;  Surgeon: Milus Banister, MD;  [facility];  Service: Endoscopy;  Laterality: N/A;  possible stricture  . ESOPHAGOGASTRODUODENOSCOPY (EGD) WITH PROPOFOL N/A 12/10/2015   Procedure: ESOPHAGOGASTRODUODENOSCOPY (EGD) WITH PROPOFOL;  Surgeon: Gatha Mayer, MD;  [facility];  Service: Endoscopy;  Laterality: N/A;  . ESOPHAGOGASTRODUODENOSCOPY (EGD) WITH PROPOFOL N/A 02/06/2016   Procedure: ESOPHAGOGASTRODUODENOSCOPY (EGD) WITH PROPOFOL;  Surgeon: Gatha Mayer, MD;  [facility];  Service: Endoscopy;  Laterality: N/A;  . ESOPHAGOGASTRODUODENOSCOPY (EGD) WITH PROPOFOL N/A 10/10/2016   Procedure: ESOPHAGOGASTRODUODENOSCOPY (EGD) WITH PROPOFOL;  Surgeon: Gatha Mayer, MD;  [facility];   Service: Endoscopy;  Laterality: N/A;  needs APC   . EUS N/A 06/14/2015   Procedure: ESOPHAGEAL ENDOSCOPIC ULTRASOUND (EUS) RADIAL;  Surgeon: Milus Banister, MD;  [facility];  Service: Endoscopy;  Laterality: N/A;  . HOT HEMOSTASIS N/A 10/10/2016   Procedure: HOT HEMOSTASIS (ARGON PLASMA COAGULATION/BICAP);  Surgeon: Gatha Mayer, MD;  [facility];  Service: Endoscopy;  Laterality: N/A;  . KNEE ARTHROSCOPY Right    Right Knee, [place] [facility]  . LAPAROSCOPIC GASTRECTOMY N/A 07/18/2015   Procedure: SUB TOTAL GASTRECTOMY;  Surgeon: Stark Klein, MD;  [facility];  Service: General;  Laterality: N/A;  . LAPAROSCOPIC GASTROSTOMY N/A 07/18/2015   Procedure: FEEDING TUBE PLACEMENT;  Surgeon: Stark Klein, MD;  [facility];  Service: General;  Laterality: N/A;  . LAPAROSCOPY N/A 07/18/2015   Procedure: LAPAROSCOPY DIAGNOSTIC;  Surgeon: Stark Klein, MD;  [facility];  Service: General;  Laterality: N/A;  . MELANOMA EXCISION  02-01-16   abdominal wall  . PROSTATE BIOPSY  09/06/14  . ROTATOR CUFF REPAIR Right    R shoulder, [place]  . SAVORY DILATION N/A 02/06/2016   Procedure:  SAVORY DILATION;  Surgeon: Gatha Mayer, MD;  Location[facility];  Service: Endoscopy;  Laterality: N/A;  . TOTAL SHOULDER ARTHROPLASTY Left 05/07/2017   Procedure: LEFT REVERSE TOTAL SHOULDER ARTHROPLASTY;  Surgeon: Justice Britain, MD;  Location: [place];  Service: Orthopedics;  Laterality: Left;  . TRIGGER FINGER RELEASE Left 1998  . UPPER GASTROINTESTINAL ENDOSCOPY     x14  . VASECTOMY     Social History   Social History Narrative   Married and retired - 1 son and 1 daughter    Lives at [place] and active golfer, tennis player   05/30/2015   family history includes Alcohol abuse in his mother; Cirrhosis in his mother; Diabetes in his father and sister; Heart attack (age of onset: 63) in his father; Liver disease in his father; Multiple myeloma in his brother.   Review of  Systems As above  Objective:   Physical Exam @BP  118/80   Pulse 70   Ht 5' 6"  (1.676 m)   Wt 157 lb (71.2 kg)   BMI 25.34 kg/m @  General:  NAD Eyes:   anicteric Lungs:  clear Heart::  S1S2 no rubs, murmurs or gallops Abdomen:  Thin w/ well-healed surgicalscars Ext:   no  cyanosis or clubbing    Data Reviewed: as per HPI 07/2017 CBC NL except wbc 2.7

## 2017-12-14 NOTE — Patient Instructions (Addendum)
You have been scheduled for an endoscopy. Please follow written instructions given to you at your visit today. If you use inhalers (even only as needed), please bring them with you on the day of your procedure.  Normal BMI (Body Mass Index- based on height and weight) is between 23 and 30. Your BMI today is Body mass index is 25.34 kg/m. [name] Please consider follow up  regarding your BMI with your Primary Care Provider.    I appreciate the opportunity to care for you. Silvano Rusk, MD, [facility]

## 2017-12-24 DIAGNOSIS — D225 Melanocytic nevi of trunk: Secondary | ICD-10-CM | POA: Diagnosis not present

## 2017-12-24 DIAGNOSIS — L720 Epidermal cyst: Secondary | ICD-10-CM | POA: Diagnosis not present

## 2017-12-24 DIAGNOSIS — L570 Actinic keratosis: Secondary | ICD-10-CM | POA: Diagnosis not present

## 2017-12-24 DIAGNOSIS — D2272 Melanocytic nevi of left lower limb, including hip: Secondary | ICD-10-CM | POA: Diagnosis not present

## 2017-12-24 DIAGNOSIS — Z85828 Personal history of other malignant neoplasm of skin: Secondary | ICD-10-CM | POA: Diagnosis not present

## 2017-12-24 DIAGNOSIS — Z85820 Personal history of malignant melanoma of skin: Secondary | ICD-10-CM | POA: Diagnosis not present

## 2017-12-24 DIAGNOSIS — L821 Other seborrheic keratosis: Secondary | ICD-10-CM | POA: Diagnosis not present

## 2017-12-24 DIAGNOSIS — D2262 Melanocytic nevi of left upper limb, including shoulder: Secondary | ICD-10-CM | POA: Diagnosis not present

## 2017-12-24 DIAGNOSIS — D692 Other nonthrombocytopenic purpura: Secondary | ICD-10-CM | POA: Diagnosis not present

## 2017-12-24 DIAGNOSIS — D485 Neoplasm of uncertain behavior of skin: Secondary | ICD-10-CM | POA: Diagnosis not present

## 2017-12-24 DIAGNOSIS — D2271 Melanocytic nevi of right lower limb, including hip: Secondary | ICD-10-CM | POA: Diagnosis not present

## 2017-12-24 DIAGNOSIS — D1801 Hemangioma of skin and subcutaneous tissue: Secondary | ICD-10-CM | POA: Diagnosis not present

## 2018-01-21 ENCOUNTER — Other Ambulatory Visit: Payer: Self-pay

## 2018-01-21 ENCOUNTER — Encounter: Payer: Self-pay | Admitting: Internal Medicine

## 2018-01-21 ENCOUNTER — Ambulatory Visit (AMBULATORY_SURGERY_CENTER): Payer: Medicare Other | Admitting: Internal Medicine

## 2018-01-21 VITALS — BP 137/61 | HR 55 | Temp 97.5°F | Resp 14 | Ht 66.0 in | Wt 157.0 lb

## 2018-01-21 DIAGNOSIS — K222 Esophageal obstruction: Secondary | ICD-10-CM

## 2018-01-21 DIAGNOSIS — K219 Gastro-esophageal reflux disease without esophagitis: Secondary | ICD-10-CM | POA: Diagnosis not present

## 2018-01-21 DIAGNOSIS — R1310 Dysphagia, unspecified: Secondary | ICD-10-CM | POA: Diagnosis not present

## 2018-01-21 MED ORDER — SODIUM CHLORIDE 0.9 % IV SOLN: 500.0000 mL | Freq: Once | INTRAVENOUS | Status: DC

## 2018-01-21 NOTE — Progress Notes (Signed)
Pt's states no medical or surgical changes since previsit or office visit. 

## 2018-01-21 NOTE — Progress Notes (Signed)
Report to PACU, RN, vss, BBS= Clear.  

## 2018-01-21 NOTE — Patient Instructions (Addendum)
I dilated to 20 mm.  That should help.  It was open to about 15 mm so much better than in the past.  Be careful with steak, chicken, bread and rice.  Cut small and chew well.  Call me if you need me.  I appreciate the opportunity to care for you. Gatha Mayer, MD, [facility]    Handout given : Post Dilatation Diet.  YOU HAD AN ENDOSCOPIC PROCEDURE TODAY AT [facility]:   Refer to the procedure report that was given to you for any specific questions about what was found during the examination.  If the procedure report does not answer your questions, please call your gastroenterologist to clarify.  If you requested that your care partner not be given the details of your procedure findings, then the procedure report has been included in a sealed envelope for you to review at your convenience later.  YOU SHOULD EXPECT: Some feelings of bloating in the abdomen. Passage of more gas than usual.  Walking can help get rid of the air that was put into your GI tract during the procedure and reduce the bloating. If you had a lower endoscopy (such as a colonoscopy or flexible sigmoidoscopy) you may notice spotting of blood in your stool or on the toilet paper. If you underwent a bowel prep for your procedure, you may not have a normal bowel movement for a few days.  Please Note:  You might notice some irritation and congestion in your nose or some drainage.  This is from the oxygen used during your procedure.  There is no need for concern and it should clear up in a day or so.  SYMPTOMS TO REPORT IMMEDIATELY:    Following upper endoscopy (EGD)  Vomiting of blood or coffee ground material  New chest pain or pain under the shoulder blades  Painful or persistently difficult swallowing  New shortness of breath  Fever of 100F or higher  Black, tarry-looking stools  For urgent or emergent issues, a gastroenterologist can be reached at any hour by calling [phone]  [phone].   DIET: Clear liquids until 11:30. 11:30 until morning only soft foods. Resume your Regular Diet in the AM.  ACTIVITY:  You should plan to take it easy for the rest of today and you should NOT DRIVE or use heavy machinery until tomorrow (because of the sedation medicines used during the test).    FOLLOW UP: Our staff will call the number listed on your records the next business day following your procedure to check on you and address any questions or concerns that you may have regarding the information given to you following your procedure. If we do not reach you, we will leave a message.  However, if you are feeling well and you are not experiencing any problems, there is no need to return our call.  We will assume that you have returned to your regular daily activities without incident.  If any biopsies were taken you will be contacted by phone or by letter within the next 1-3 weeks.  Please call us at [phone] if you have not heard about the biopsies in 3 weeks.    SIGNATURES/CONFIDENTIALITY: You and/or your care partner have signed paperwork which will be entered into your electronic medical record.  These signatures attest to the fact that that the information above on your After Visit Summary has been reviewed and is understood.  Full responsibility of the confidentiality of this discharge information lies  with you and/or your care-partner.

## 2018-01-21 NOTE — Op Note (Signed)
[name]: James [name] Procedure Date: 01/21/2018 9:57 AM MRN: [id number] Endoscopist: Gatha Mayer , MD Age: 72 Referring MD:  Date of Birth: 17-Dec-1945 Gender: Male Account #: [id number] Procedure:                Upper GI endoscopy Indications:              Post-surgical anastomotic stenosis, For therapy of                            post-surgical anastomotic stenosis Medicines:                Propofol per Anesthesia, Monitored Anesthesia Care Procedure:                Pre-Anesthesia Assessment:                           - Prior to the procedure, a History and Physical                            was performed, and patient medications and                            allergies were reviewed. The patient's tolerance of                            previous anesthesia was also reviewed. The risks                            and benefits of the procedure and the sedation                            options and risks were discussed with the patient.                            All questions were answered, and informed consent                            was obtained. Prior Anticoagulants: The patient has                            taken no previous anticoagulant or antiplatelet                            agents. ASA Grade Assessment: III - A patient with                            severe systemic disease. After reviewing the risks                            and benefits, the patient was deemed in                            satisfactory condition to undergo the procedure.  After obtaining informed consent, the endoscope was                            passed under direct vision. Throughout the                            procedure, the patient's blood pressure, pulse, and                            oxygen saturations were monitored continuously. The                            Model GIF-[id number]) scope was introduced       through the mouth, and advanced to the second part                            of duodenum. The upper GI endoscopy was                            accomplished without difficulty. The patient                            tolerated the procedure well. Scope In: Scope Out: Findings:                 One benign-appearing, intrinsic moderate stenosis                            was found. This stenosis measured 1.5 cm (inner                            diameter). The stenosis was traversed. A TTS                            dilator was passed through the scope. Dilation with                            a 13.5-14.5-15.5 mm balloon and an 18-19-20 mm                            balloon dilator was performed to 20 mm. The                            dilation site was examined and showed moderate                            improvement in luminal narrowing. Estimated blood                            loss was minimal.                           A deformity was found in the cardia, in the gastric  fundus and in the gastric body. S/p resection of                            proximal stomach for prior gastric cancer.                           The examined duodenum was normal.                           Normal post-op exam on retroflexion. Complications:            No immediate complications. Estimated Blood Loss:     Estimated blood loss was minimal. Impression:               - Benign-appearing esophageal stenosis. Dilated.                           - Post-surgical deformity in the cardia, in the                            gastric fundus and in the gastric body.                           - Normal examined duodenum.                           - No specimens collected. Recommendation:           - Patient has a contact number available for                            emergencies. The signs and symptoms of potential                            delayed complications were discussed with the                             patient. Return to normal activities tomorrow.                            Written discharge instructions were provided to the                            patient.                           - Clear liquids x 1 hour then soft foods rest of                            day. Start prior diet tomorrow.                           - Continue present medications.                           - Repeat upper endoscopy PRN for retreatment. Gatha Mayer, MD 01/21/2018 10:22:08 AM  This report has been signed electronically.

## 2018-01-25 ENCOUNTER — Telehealth: Payer: Self-pay | Admitting: *Deleted

## 2018-01-25 NOTE — Telephone Encounter (Signed)
  Follow up Call-  Call back number 01/21/2018 08/04/2017 10/02/2016 04/11/2016 11/01/2015 08/29/2015 08/22/2015  Post procedure Call Back phone  # [phone]  Permission to leave phone message No Yes Yes Yes Yes Yes Yes  Some recent data might be hidden     Patient questions:  Do you have a fever, pain , or abdominal swelling? No. Pain Score  0 *  Have you tolerated food without any problems? Yes.    Have you been able to return to your normal activities? Yes.    Do you have any questions about your discharge instructions: Diet   No. Medications  No. Follow up visit  No.  Do you have questions or concerns about your Care? No.  Actions: * If pain score is 4 or above: No action needed, pain <4.

## 2018-01-28 ENCOUNTER — Telehealth: Payer: Self-pay | Admitting: Emergency Medicine

## 2018-01-28 NOTE — Telephone Encounter (Signed)
Called patient to schedule AWV. Spoke to patients wife who declined at this time.

## 2018-01-29 NOTE — Telephone Encounter (Signed)
Error opening  

## 2018-02-01 ENCOUNTER — Telehealth: Payer: Self-pay

## 2018-02-01 ENCOUNTER — Inpatient Hospital Stay: Payer: Medicare Other | Attending: Oncology

## 2018-02-01 DIAGNOSIS — E538 Deficiency of other specified B group vitamins: Secondary | ICD-10-CM | POA: Insufficient documentation

## 2018-02-01 DIAGNOSIS — Z923 Personal history of irradiation: Secondary | ICD-10-CM | POA: Insufficient documentation

## 2018-02-01 DIAGNOSIS — D509 Iron deficiency anemia, unspecified: Secondary | ICD-10-CM | POA: Insufficient documentation

## 2018-02-01 DIAGNOSIS — T451X5S Adverse effect of antineoplastic and immunosuppressive drugs, sequela: Secondary | ICD-10-CM | POA: Insufficient documentation

## 2018-02-01 DIAGNOSIS — Z9221 Personal history of antineoplastic chemotherapy: Secondary | ICD-10-CM | POA: Diagnosis not present

## 2018-02-01 DIAGNOSIS — C161 Malignant neoplasm of fundus of stomach: Secondary | ICD-10-CM | POA: Insufficient documentation

## 2018-02-01 DIAGNOSIS — Z85820 Personal history of malignant melanoma of skin: Secondary | ICD-10-CM | POA: Diagnosis not present

## 2018-02-01 DIAGNOSIS — K222 Esophageal obstruction: Secondary | ICD-10-CM | POA: Insufficient documentation

## 2018-02-01 DIAGNOSIS — D6959 Other secondary thrombocytopenia: Secondary | ICD-10-CM | POA: Insufficient documentation

## 2018-02-01 DIAGNOSIS — C165 Malignant neoplasm of lesser curvature of stomach, unspecified: Secondary | ICD-10-CM

## 2018-02-01 DIAGNOSIS — C160 Malignant neoplasm of cardia: Secondary | ICD-10-CM | POA: Insufficient documentation

## 2018-02-01 DIAGNOSIS — Z8546 Personal history of malignant neoplasm of prostate: Secondary | ICD-10-CM | POA: Diagnosis not present

## 2018-02-01 LAB — CBC WITH DIFFERENTIAL/PLATELET
BASOS ABS: 0 10*3/uL (ref 0.0–0.1)
Basophils Relative: 1 %
EOS PCT: 6 %
Eosinophils Absolute: 0.2 10*3/uL (ref 0.0–0.5)
HCT: 41.2 % (ref 38.4–49.9)
HEMOGLOBIN: 14 g/dL (ref 13.0–17.1)
LYMPHS ABS: 1.1 10*3/uL (ref 0.9–3.3)
LYMPHS PCT: 33 %
MCH: 32.5 pg (ref 27.2–33.4)
MCHC: 33.9 g/dL (ref 32.0–36.0)
MCV: 95.9 fL (ref 79.3–98.0)
Monocytes Absolute: 0.4 10*3/uL (ref 0.1–0.9)
Monocytes Relative: 13 %
NEUTROS ABS: 1.6 10*3/uL (ref 1.5–6.5)
NEUTROS PCT: 47 %
PLATELETS: 129 10*3/uL — AB (ref 140–400)
RBC: 4.3 MIL/uL (ref 4.20–5.82)
RDW: 14.8 % — ABNORMAL HIGH (ref 11.0–14.6)
WBC: 3.4 10*3/uL — AB (ref 4.0–10.3)

## 2018-02-01 NOTE — Telephone Encounter (Signed)
Patient was scheduled on 4/30 for his f/u. Per 4/29 unscheduled apptionment

## 2018-02-02 ENCOUNTER — Inpatient Hospital Stay (HOSPITAL_BASED_OUTPATIENT_CLINIC_OR_DEPARTMENT_OTHER): Payer: Medicare Other | Admitting: Oncology

## 2018-02-02 ENCOUNTER — Telehealth: Payer: Self-pay | Admitting: Oncology

## 2018-02-02 VITALS — BP 117/77 | HR 55 | Temp 97.9°F | Resp 17 | Ht 66.0 in | Wt 150.8 lb

## 2018-02-02 DIAGNOSIS — Z923 Personal history of irradiation: Secondary | ICD-10-CM | POA: Diagnosis not present

## 2018-02-02 DIAGNOSIS — Z8546 Personal history of malignant neoplasm of prostate: Secondary | ICD-10-CM

## 2018-02-02 DIAGNOSIS — C165 Malignant neoplasm of lesser curvature of stomach, unspecified: Secondary | ICD-10-CM

## 2018-02-02 DIAGNOSIS — K222 Esophageal obstruction: Secondary | ICD-10-CM

## 2018-02-02 DIAGNOSIS — C161 Malignant neoplasm of fundus of stomach: Secondary | ICD-10-CM

## 2018-02-02 DIAGNOSIS — Z85820 Personal history of malignant melanoma of skin: Secondary | ICD-10-CM | POA: Diagnosis not present

## 2018-02-02 DIAGNOSIS — C160 Malignant neoplasm of cardia: Secondary | ICD-10-CM

## 2018-02-02 DIAGNOSIS — T451X5S Adverse effect of antineoplastic and immunosuppressive drugs, sequela: Secondary | ICD-10-CM

## 2018-02-02 DIAGNOSIS — D696 Thrombocytopenia, unspecified: Secondary | ICD-10-CM

## 2018-02-02 DIAGNOSIS — D6959 Other secondary thrombocytopenia: Secondary | ICD-10-CM

## 2018-02-02 DIAGNOSIS — D509 Iron deficiency anemia, unspecified: Secondary | ICD-10-CM | POA: Diagnosis not present

## 2018-02-02 DIAGNOSIS — E538 Deficiency of other specified B group vitamins: Secondary | ICD-10-CM | POA: Diagnosis not present

## 2018-02-02 DIAGNOSIS — Z9221 Personal history of antineoplastic chemotherapy: Secondary | ICD-10-CM | POA: Diagnosis not present

## 2018-02-02 NOTE — Telephone Encounter (Signed)
Appointments scheduled AVS/Calendar printed per 4/30 los °

## 2018-02-02 NOTE — Progress Notes (Signed)
[facility] OFFICE PROGRESS NOTE   Diagnosis: Gastric cancer  INTERVAL HISTORY:   James [name] returns as scheduled.  He feels well.  He is playing golf and tennis.  He only has dysphasia when he eats lettuce. He underwent an upper endoscopy by Dr. Arelia Longest on 01/21/2018.  A benign-appearing stenosis was found per the stenosis was dilated. He continues iron and vitamin B12.  Objective:  Vital signs in last 24 hours:  Blood pressure 117/77, pulse (!) 55, temperature 97.9 F (36.6 C), temperature source Oral, resp. rate 17, height 5\' 6"  (1.676 m), weight 150 lb 12.8 oz (68.4 kg), SpO2 98 %.    HEENT: Neck without mass Lymphatics: No cervical, supraclavicular, axillary, or inguinal nodes Resp: Lungs clear bilaterally Cardio: Regular rate and rhythm GI: No hepatomegaly, no mass, nontender Vascular: No leg edema   Lab Results:  Lab Results  Component Value Date   WBC 3.4 (L) 02/01/2018   HGB 14.0 02/01/2018   HCT 41.2 02/01/2018   MCV 95.9 02/01/2018   PLT 129 (L) 02/01/2018   NEUTROABS 1.6 02/01/2018    CMP     Component Value Date/Time   NA 139 03/27/2017 0000   NA 139 09/24/2016 0852   K 4.2 03/27/2017 0000   K 4.0 09/24/2016 0852   CL 105 03/27/2017 0000   CO2 21 03/27/2017 0000   CO2 26 09/24/2016 0852   GLUCOSE 119 (H) 03/27/2017 0000   GLUCOSE 97 09/24/2016 0852   BUN 15 03/31/2017   BUN 13.3 09/24/2016 0852   CREATININE 0.73 (L) 03/27/2017 0000   CREATININE 0.7 09/24/2016 0852   CALCIUM 9.0 03/27/2017 0000   CALCIUM 8.9 09/24/2016 0852   PROT 6.2 03/27/2017 0000   PROT 6.6 09/24/2016 0852   ALBUMIN 4.2 03/27/2017 0000   ALBUMIN 3.9 09/24/2016 0852   AST 23 03/27/2017 0000   AST 21 09/24/2016 0852   ALT 8 03/27/2017 0000   ALT 11 09/24/2016 0852   ALKPHOS 83 03/27/2017 0000   ALKPHOS 80 09/24/2016 0852   BILITOT 0.2 03/27/2017 0000   BILITOT 0.61 09/24/2016 0852   GFRNONAA 93 03/27/2017 0000   GFRAA 107 03/27/2017 0000      Medications: I have reviewed the patient's current medications.   Assessment/Plan: 1. Gastric cancer-adenocarcinoma of the gastric fundus/cardia ? Staging CT scans 06/07/2015 with no evidence of distant metastatic disease, tiny nonspecific pulmonary nodules and liver lesions-likely benign ? Subtotal gastrectomy and placement of a jejunostomy feeding tube 07/18/2015 for a T2,N0 tumor with negative surgical margins ? Initiation of adjuvant 5-FU/leucovorin 08/28/2015, status post 3 weekly treatments completed 09/13/2015 ? Initiation of radiation and concurrent Xeloda 10/29/2015 ? 11/26/2015 Xeloda dose reduced to 1000 mg twice daily due to progressive thrombocytopenia ? Radiation completed 12/06/2015 ? 5-FU/leucovorin resumed 12/21/2015   2. 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  Upper endoscopy 10/02/2016 with findings of multiple small bleeding angioectasiasstatus post argon plasma coagulation on 10/10/2016  3. Vitamin B-12 deficiency-Low-level confirmed 01/04/2016, maintained on oral vitamin B-12 replacement  4. 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 Carlean Purl 11/01/2015 and 12/10/2015; EGD 10/02/2016 with finding of esophageal stenosis at the gastroesophageal junction status post dilation 10/10/2016 and 01/21/2018  7. Mild  neutropenia/thrombocytopenia secondary to chemotherapy. Progressive thrombocytopenia 11/26/2015. Xeloda dose reduced to 1000 mg twice daily. Counts stable to improved 01/04/2016.  Persistent mild thrombocytopenia  8.Erythema/tenderness at  the jejunostomy feeding tube site 09/20/2015, improved  9. Feeding tube removed 01/04/2016  10. Melanoma left lower abdominal wall status post wide excision.    Disposition: [name] is in clinical remission from gastric cancer.  He will return for an office visit and CBC in 6 months.  He continues follow-up with Dr. Carlean Purl for management of the gastroesophageal stricture.  15 minutes were spent with the patient today.  The majority of the time was used for counseling and coordination of care.  Betsy Coder, MD  02/02/2018  8:47 AM

## 2018-05-14 ENCOUNTER — Encounter: Payer: Self-pay | Admitting: Nurse Practitioner

## 2018-05-14 ENCOUNTER — Ambulatory Visit (INDEPENDENT_AMBULATORY_CARE_PROVIDER_SITE_OTHER): Payer: Medicare Other | Admitting: Nurse Practitioner

## 2018-05-14 VITALS — BP 114/70 | HR 73 | Temp 98.8°F | Resp 16 | Ht 66.0 in | Wt 150.8 lb

## 2018-05-14 DIAGNOSIS — L237 Allergic contact dermatitis due to plants, except food: Secondary | ICD-10-CM | POA: Diagnosis not present

## 2018-05-14 MED ORDER — METHYLPREDNISOLONE ACETATE 40 MG/ML IJ SUSP
40.0000 mg | Freq: Once | INTRAMUSCULAR | Status: AC
  Administered 2018-05-14: 40 mg via INTRAMUSCULAR

## 2018-05-14 MED ORDER — PREDNISONE 10 MG PO TABS
10.0000 mg | ORAL_TABLET | Freq: Every day | ORAL | 0 refills | Status: DC
Start: 2018-05-14 — End: 2018-07-07

## 2018-05-14 NOTE — Progress Notes (Signed)
Name: RAMAR NOBREGA   MRN: [id number]    DOB: [birth year]   Date:05/14/2018       Progress Note  Subjective  Chief Complaint  Chief Complaint  Patient presents with  . Rash    states he has had rash on both legs for a week, went to get a ball when playing golf and states he got in poison oak    HPI  Mr Keaney is here today for acute complaint of rash to BLE, first noticed after walking into poison ivy to get his golf ball last Monday. He reports as soon as he realized he was in poison ivy, he began scratching his entire legs. He reports extremely itch rash to ble since, hard time sleeping at night because of the itching. He denies fevers, pain, drainage. He has been applying OTC rash cream without much relief.  Patient Active Problem List   Diagnosis Date Noted  . Nonintractable headache 07/08/2017  . 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 Gastric AVM   . Thrombocytopenia ([place]) 05/24/2016  . Malignant melanoma ([place]) 01/08/2016  . Anastomotic stricture of stomach   . Esophageal stricture   . Carcinoma of lesser curvature of stomach ([place]) 07/18/2015  . Gastric cancer ([place]) 06/08/2015  . B12 deficiency 06/05/2015  . Prostate cancer ([place]) 10/24/2014  . Unspecified vitamin D deficiency 06/03/2014  . History of transfusion 12/16/2013  . Basal cell cancer 09/17/2012  . HEARING DEFICIT 12/21/2009  . NONSPECIFIC ABNORMAL ELECTROCARDIOGRAM 12/21/2009  . History of colonic polyps 12/04/2008  . HYPERLIPIDEMIA 08/23/2007    Social History   Tobacco Use  . Smoking status: Former Smoker    Packs/day: 0.50    Years: 5.00    Pack years: 2.50    Types: Cigarettes    Last attempt to quit: 10/07/1967    Years since quitting: 50.6  . Smokeless tobacco: Never Used  . Tobacco comment: smoked 1963- 1969 , up to < 1 ppd  Substance Use Topics  . Alcohol use: Yes    Comment: maybe once a week     Current Outpatient Medications:  .  acetaminophen (TYLENOL) 500 MG tablet, Take  1,000 mg by mouth every 6 (six) hours as needed for mild pain. , Disp: , Rfl:  .  ferrous sulfate 325 (65 FE) MG tablet, Take 1 tablet (325 mg total) by mouth daily with breakfast. (Patient taking differently: Take 325 mg by mouth 2 (two) times daily with a meal. ), Disp: , Rfl: 3 .  Multiple Vitamin (MULTIVITAMIN WITH MINERALS) TABS tablet, Take 1 tablet by mouth every other day. , Disp: , Rfl:  .  omeprazole (PRILOSEC) 40 MG capsule, take 1 capsule by mouth once daily 30 MINUTES BEFORE BREAKFAST/SUPPER.  OKAY TO OPEN AND SPRINKLE ON APPLESAUCE (Patient taking differently: Take 40 mg by mouth daily. take 1 capsule by mouth once daily 30 MINUTES BEFORE BREAKFAST/SUPPER.  OKAY TO OPEN AND SPRINKLE ON APPLESAUCE), Disp: 180 capsule, Rfl: 3 .  pravastatin (PRAVACHOL) 40 MG tablet, Take 1 tablet (40 mg total) by mouth at bedtime. Must keep June appt for future refills, Disp: 90 tablet, Rfl: 3 .  vitamin B-12 (CYANOCOBALAMIN) 1000 MCG tablet, Take by mouth., Disp: , Rfl:   Allergies  Allergen Reactions  . Codeine Nausea And Vomiting    ROS  No other specific complaints in a complete review of systems (except as listed in HPI above).  Objective  Vitals:   05/14/18 1526  BP: 114/70  Pulse:  73  Resp: 16  Temp: 98.8 F (37.1 C)  TempSrc: Oral  SpO2: 98%  Weight: 150 lb 12.8 oz (68.4 kg)  Height: 5\' 6"  (1.676 m)    Body mass index is 24.34 kg/m.  Nursing Note and Vital Signs reviewed.  Physical Exam  Constitutional: Patient appears well-developed and well-nourished. No distress.  HEENT: head atraumatic, normocephalic, pupils equal and reactive to light, EOM's intact, neck supple, oropharynx pink and moist without exudate Cardiovascular: Normal rate, regular rhythm, distal pulses intact. Pulmonary/Chest: Effort normal, No respiratory distress or retractions. Neurological: He is alert and oriented to person, place, and time. No cranial nerve deficit. Coordination, balance, strength,  speech and gait are normal.  Skin: Skin is warm and dry. Erythematous rash with vesicles and papules to BLE from knees to ankles. Psychiatric: Patient has a normal mood and affect. behavior is normal.    Assessment & Plan  1. Poison ivy dermatitis Depo-medrol injection in clinic today, prednisone taper to start at home tomorrow-dosing and side effects discussed -Home management, Red flags and when to present for emergency care or RTC including fever >101.60F, chest pain, shortness of breath, new/worsening/un-resolving symptoms, reviewed with patient at time of visit. Follow up and care instructions discussed and provided in AVS. - predniSONE (DELTASONE) 10 MG tablet; Take 1 tablet (10 mg total) by mouth daily with breakfast. 40mg  for 3 days, 30mg  for for 4 days, 20mg  for 4 days, 10mg  for 6 days  Dispense: 38 tablet; Refill: 0 - methylPREDNISolone acetate (DEPO-MEDROL) injection 40 mg

## 2018-05-14 NOTE — Patient Instructions (Addendum)
I have sent a prescription for prednisone, please start this tomorrow as follows: 40mg  for 3 days, 30mg  for for 4 days, 20mg  for 4 days, 10mg  for 6 days  Please wash any items you could have contacted after exposure to the poison ivy.  Please follow up for fevers, signs in infection, or if your symptoms dont resolve or worsen.   Poison Ivy Dermatitis Poison ivy dermatitis is redness and soreness (inflammation) of the skin. It is caused by a chemical that is found on the leaves of the poison ivy plant. You may also have itching, a rash, and blisters. Symptoms often clear up in 1-2 weeks. You may get this condition by touching a poison ivy plant. You can also get it by touching something that has the chemical on it. This may include animals or objects that have come in contact with the plant. Follow these instructions at home: General instructions  Take or apply over-the-counter and prescription medicines only as told by your doctor.  If you touch poison ivy, wash your skin with soap and cold water right away.  Use hydrocortisone creams or calamine lotion as needed to help with itching.  Take oatmeal baths as needed. Use colloidal oatmeal. You can get this at a pharmacy or grocery store. Follow the instructions on the package.  Do not scratch or rub your skin.  While you have the rash, wash your clothes right after you wear them. Prevention  Know what poison ivy looks like so you can avoid it. This plant has three leaves with flowering branches on a single stem. The leaves are glossy. They have uneven edges that come to a point at the front.  If you have touched poison ivy, wash with soap and water right away. Be sure to wash under your fingernails.  When hiking or camping, wear long pants, a long-sleeved shirt, tall socks, and hiking boots. You can also use a lotion on your skin that helps to prevent contact with the chemical on the plant.  If you think that your clothes or outdoor  gear came in contact with poison ivy, rinse them off with a garden hose before you bring them inside your house. Contact a doctor if:  You have open sores in the rash area.  You have more redness, swelling, or pain in the affected area.  You have redness that spreads beyond the rash area.  You have fluid, blood, or pus coming from the affected area.  You have a fever.  You have a rash over a large area of your body.  You have a rash on your eyes, mouth, or genitals.  Your rash does not get better after a few days. Get help right away if:  Your face swells or your eyes swell shut.  You have trouble breathing.  You have trouble swallowing. This information is not intended to replace advice given to you by your health care provider. Make sure you discuss any questions you have with your health care provider. Document Released: 10/25/2010 Document Revised: 02/28/2016 Document Reviewed: 02/28/2015 Elsevier Interactive Patient Education  [employer].

## 2018-05-25 ENCOUNTER — Other Ambulatory Visit: Payer: Self-pay | Admitting: Internal Medicine

## 2018-05-25 DIAGNOSIS — E782 Mixed hyperlipidemia: Secondary | ICD-10-CM

## 2018-05-25 NOTE — Telephone Encounter (Signed)
Reviewed chart pt is overdue for annual appt. Sent 30 day supply until pt see provider.[name] KitchenJohny [name]

## 2018-05-25 NOTE — Telephone Encounter (Signed)
Copied from [place] [phone]. Topic: Quick Communication - Rx Refill/Question >> May 25, 2018 11:31 AM Bea Graff, NT wrote: Medication: pravastatin (PRAVACHOL) 40 MG tablet   Has the patient contacted their pharmacy? Yes.   (Agent: If no, request that the patient contact the pharmacy for the refill.) (Agent: If yes, when and what did the pharmacy advise?) Stated for wife to call office to request   Preferred Pharmacy (with phone number or street name): [facility] [place], [place] - [place] [phone] (Phone) [phone] (Fax)    Agent: Please be advised that RX refills may take up to 3 business days. We ask that you follow-up with your pharmacy.

## 2018-06-14 DIAGNOSIS — Z85028 Personal history of other malignant neoplasm of stomach: Secondary | ICD-10-CM | POA: Diagnosis not present

## 2018-06-28 NOTE — Progress Notes (Addendum)
Subjective:   James [name] is a 72 y.o. male who presents for Medicare Annual/Subsequent preventive examination.  Review of Systems:  No ROS.  Medicare Wellness Visit. Additional risk factors are reflected in the social history.  Cardiac Risk Factors include: advanced age (>62mn, >>84women);dyslipidemia;male gender Sleep patterns: feels rested on waking, gets up 1 times nightly to void and sleeps 7-8 hours nightly.    Home Safety/Smoke Alarms: Feels safe in home. Smoke alarms in place.  Living environment; residence and Firearm Safety: 2-story house, no firearms. Lives with wife, no needs for DME, good support system Seat Belt Safety/Bike Helmet: Wears seat belt.    Objective:    Vitals: BP 122/82   Pulse (!) 55   Resp 18   Ht 5' 6"  (1.676 m)   Wt 149 lb (67.6 kg)   SpO2 98%   BMI 24.05 kg/m   Body mass index is 24.05 kg/m.  Advanced Directives 06/29/2018 02/02/2018 01/21/2018 05/01/2017 03/27/2017 11/27/2016 10/15/2016  Does Patient Have a Medical Advance Directive? Yes Yes Yes Yes Yes Yes Yes  Type of AParamedicof ARose[place]Living will Living will;Healthcare Power of AHollidaywill HDripping SpringsLiving will HFort JenningsLiving will Living will  Does patient want to make changes to medical advance directive? - - - No - Patient declined - - -  Copy of [place] Chart? No - copy requested No - copy requested - No - copy requested No - copy requested No - copy requested No - copy requested  Would patient like information on creating a medical advance directive? - - - No - Patient declined - - -    Tobacco Social History   Tobacco Use  Smoking Status Former Smoker  . Packs/day: 0.50  . Years: 5.00  . Pack years: 2.50  . Types: Cigarettes  . Last attempt to quit: 10/07/1967  . Years since quitting: 50.7  Smokeless Tobacco Never Used  Tobacco Comment   smoked 1[place], up to < 1 ppd       Counseling given: Not Answered Comment: smoked 1[place], up to < 1 ppd  Past Medical History:  Diagnosis Date  . Abnormal EKG    NS ST-T EKG Changes, (-) Nuclear Stress Test 03/2006  . Allergy   . Anemia    iron deficency  . Arthritis   . B12 deficiency 06/05/2015   B12 Low NL at 271 methtylmalonic acid high  . Cardiac murmur    nothing to be concerned with   . Cataract    extractions  . Duodenal ulcer 1972   transfusion required, 1 unit packed cells  . Dysphagia    history throat/stomach  cancer "can swallow some, uses more pureed type foods"  . Dyspnea    at times  . Esophageal reflux    no currlently  . [place]   invasive adenocarcinoma  . Gastrostomy tube in place ([facility] 2016   intermittent feeding  . Hx of basal cell carcinoma   . Hyperlipemia    no per pt 04-11-16  . [place]   . [place] 09/06[place]   last radiation 4'16   . S/P radiation therapy 11/29/2014 through 01/23/2015   Prostate 7800 cGy in 40 sessions, seminal vesicles 5600 cGy in 40 sessions   . Skin cancer, basal cell   . Transfusion history    '72 -s/p surgery for duodenal ulcer, 12/17  Past Surgical History:  Procedure Laterality Date  . BALLOON DILATION N/A 09/12/2015   Procedure: BALLOON DILATION;  Surgeon: Gatha Mayer, MD;  [facility];  Service: Endoscopy;  Laterality: N/A;  . BALLOON DILATION N/A 09/21/2015   Procedure: BALLOON DILATION;  Surgeon: Gatha Mayer, MD;  [facility];  Service: Endoscopy;  Laterality: N/A;  . BALLOON DILATION N/A 10/04/2015   Procedure: BALLOON DILATION;  Surgeon: Milus Banister, MD;  [facility];  Service: Endoscopy;  Laterality: N/A;  . BALLOON DILATION N/A 12/10/2015   Procedure: BALLOON DILATION;  Surgeon: Gatha Mayer, MD;  [facility];  Service: Endoscopy;  Laterality: N/A;  . BALLOON DILATION N/A  10/10/2016   Procedure: BALLOON DILATION;  Surgeon: Gatha Mayer, MD;  [facility];  Service: Endoscopy;  Laterality: N/A;  . CATARACT EXTRACTION, BILATERAL Bilateral   . COLONOSCOPY    . colonoscopy with polypectomy  02/2012   Dr Sharlett Iles  . ESOPHAGOGASTRODUODENOSCOPY (EGD) WITH PROPOFOL N/A 08/17/2015   Procedure: ESOPHAGOGASTRODUODENOSCOPY (EGD) WITH PROPOFOL;  Surgeon: Gatha Mayer, MD;  Location: [place];  Service: Endoscopy;  Laterality: N/A;  . ESOPHAGOGASTRODUODENOSCOPY (EGD) WITH PROPOFOL N/A 09/12/2015   Procedure: ESOPHAGOGASTRODUODENOSCOPY (EGD) WITH PROPOFOL;  Surgeon: Gatha Mayer, MD;  [facility];  Service: Endoscopy;  Laterality: N/A;  . ESOPHAGOGASTRODUODENOSCOPY (EGD) WITH PROPOFOL N/A 09/21/2015   Procedure: ESOPHAGOGASTRODUODENOSCOPY (EGD) WITH PROPOFOL;  Surgeon: Gatha Mayer, MD;  [facility];  Service: Endoscopy;  Laterality: N/A;  . ESOPHAGOGASTRODUODENOSCOPY (EGD) WITH PROPOFOL N/A 10/04/2015   Procedure: ESOPHAGOGASTRODUODENOSCOPY (EGD) WITH PROPOFOL;  Surgeon: Milus Banister, MD;  [facility];  Service: Endoscopy;  Laterality: N/A;  possible stricture  . ESOPHAGOGASTRODUODENOSCOPY (EGD) WITH PROPOFOL N/A 12/10/2015   Procedure: ESOPHAGOGASTRODUODENOSCOPY (EGD) WITH PROPOFOL;  Surgeon: Gatha Mayer, MD;  [facility];  Service: Endoscopy;  Laterality: N/A;  . ESOPHAGOGASTRODUODENOSCOPY (EGD) WITH PROPOFOL N/A 02/06/2016   Procedure: ESOPHAGOGASTRODUODENOSCOPY (EGD) WITH PROPOFOL;  Surgeon: Gatha Mayer, MD;  [facility];  Service: Endoscopy;  Laterality: N/A;  . ESOPHAGOGASTRODUODENOSCOPY (EGD) WITH PROPOFOL N/A 10/10/2016   Procedure: ESOPHAGOGASTRODUODENOSCOPY (EGD) WITH PROPOFOL;  Surgeon: Gatha Mayer, MD;  [facility];  Service: Endoscopy;  Laterality: N/A;  needs APC   . EUS N/A 06/14/2015   Procedure: ESOPHAGEAL ENDOSCOPIC ULTRASOUND (EUS) RADIAL;  Surgeon: Milus Banister,  MD;  [facility];  Service: Endoscopy;  Laterality: N/A;  . HOT HEMOSTASIS N/A 10/10/2016   Procedure: HOT HEMOSTASIS (ARGON PLASMA COAGULATION/BICAP);  Surgeon: Gatha Mayer, MD;  [facility];  Service: Endoscopy;  Laterality: N/A;  . KNEE ARTHROSCOPY Right    Right Knee, [place] [facility]  . LAPAROSCOPIC GASTRECTOMY N/A 07/18/2015   Procedure: SUB TOTAL GASTRECTOMY;  Surgeon: Stark Klein, MD;  [facility];  Service: General;  Laterality: N/A;  . LAPAROSCOPIC GASTROSTOMY N/A 07/18/2015   Procedure: FEEDING TUBE PLACEMENT;  Surgeon: Stark Klein, MD;  [facility];  Service: General;  Laterality: N/A;  . LAPAROSCOPY N/A 07/18/2015   Procedure: LAPAROSCOPY DIAGNOSTIC;  Surgeon: Stark Klein, MD;  [facility];  Service: General;  Laterality: N/A;  . MELANOMA EXCISION  02-01-16   abdominal wall  . PROSTATE BIOPSY  09/06/14  . ROTATOR CUFF REPAIR Right    R shoulder, [place]  . SAVORY DILATION N/A 02/06/2016   Procedure: SAVORY DILATION;  Surgeon: Gatha Mayer, MD;  [facility];  Service: Endoscopy;  Laterality: N/A;  . TOTAL  SHOULDER ARTHROPLASTY Left 05/07/2017   Procedure: LEFT REVERSE TOTAL SHOULDER ARTHROPLASTY;  Surgeon: Justice Britain, MD;  Location: [place];  Service: Orthopedics;  Laterality: Left;  . TRIGGER FINGER RELEASE Left 1998  . UPPER GASTROINTESTINAL ENDOSCOPY     x14  . VASECTOMY     Family History  Problem Relation Age of Onset  . Diabetes Father   . Heart attack Father 6  . Liver disease Father        Related to Alcohol Use   . Alcohol abuse Mother   . Cirrhosis Mother        died in her 7s  . Multiple myeloma Brother   . Diabetes Sister   . Colon cancer Neg Hx   . Stomach cancer Neg Hx   . Stroke Neg Hx   . Esophageal cancer Neg Hx   . Rectal cancer Neg Hx    Social History   Socioeconomic History  . Marital status: Married    Spouse name: Not on file  . Number of children: 2  . Years of education: Not on file  .  Highest education level: Not on file  Occupational History  . Occupation: retired  Scientific laboratory technician  . Financial resource strain: Not hard at all  . Food insecurity:    Worry: Never true    Inability: Never true  . Transportation needs:    Medical: No    Non-medical: No  Tobacco Use  . Smoking status: Former Smoker    Packs/day: 0.50    Years: 5.00    Pack years: 2.50    Types: Cigarettes    Last attempt to quit: 10/07/1967    Years since quitting: 50.7  . Smokeless tobacco: Never Used  . Tobacco comment: smoked [place] , up to < 1 ppd  Substance and Sexual Activity  . Alcohol use: Yes    Comment: maybe once a week  . Drug use: No  . Sexual activity: Yes  Lifestyle  . Physical activity:    Days per week: 5 days    Minutes per session: 60 min  . Stress: Not at all  Relationships  . Social connections:    Talks on phone: More than three times a week    Gets together: More than three times a week    Attends religious service: 1 to 4 times per year    Active member of club or organization: Yes    Attends meetings of clubs or organizations: More than 4 times per year    Relationship status: Married  Other Topics Concern  . Not on file  Social History Narrative   Married and retired - 1 son and 1 daughter    Lives at [place] and active golfer, tennis player   05/30/2015    Outpatient Encounter Medications as of 06/29/2018  Medication Sig  . acetaminophen (TYLENOL) 500 MG tablet Take 1,000 mg by mouth every 6 (six) hours as needed for mild pain.   . ferrous sulfate 325 (65 FE) MG tablet Take 1 tablet (325 mg total) by mouth daily with breakfast.  . Multiple Vitamin (MULTIVITAMIN WITH MINERALS) TABS tablet Take 1 tablet by mouth every other day.   [name] omeprazole (PRILOSEC) 40 MG capsule take 1 capsule by mouth once daily 30 MINUTES BEFORE BREAKFAST/SUPPER.  OKAY TO OPEN AND SPRINKLE ON APPLESAUCE (Patient taking differently: Take 40 mg by mouth daily. take 1 capsule by mouth  once daily 30 MINUTES BEFORE BREAKFAST/SUPPER.  OKAY TO OPEN AND  SPRINKLE ON APPLESAUCE)  . pravastatin (PRAVACHOL) 40 MG tablet Take 1 tablet (40 mg total) by mouth at bedtime. Overdue for appt must see provider for future refills  . vitamin B-12 (CYANOCOBALAMIN) 1000 MCG tablet Take by mouth.  . predniSONE (DELTASONE) 10 MG tablet Take 1 tablet (10 mg total) by mouth daily with breakfast. 75m for 3 days, 362mfor for 4 days, 2048mor 4 days, 59m1mr 6 days (Patient not taking: Reported on 06/29/2018)   No facility-administered encounter medications on file as of 06/29/2018.     Activities of Daily Living In your present state of health, do you have any difficulty performing the following activities: 06/29/2018  Hearing? N  Vision? N  Difficulty concentrating or making decisions? N  Walking or climbing stairs? N  Dressing or bathing? N  Doing errands, shopping? N  Preparing Food and eating ? N  Using the Toilet? N  In the past six months, have you accidently leaked urine? N  Do you have problems with loss of bowel control? N  Managing your Medications? N  Managing your Finances? N  Housekeeping or managing your Housekeeping? N  Some recent data might be hidden    Patient Care Team: BurnBinnie Rail as PCP - General (Internal Medicine)   Assessment:   This is a routine wellness examination for DaviPruittysical assessment deferred to PCP.   Exercise Activities and Dietary recommendations Current Exercise Habits: Structured exercise class;Home exercise routine, Type of exercise: walking;strength training/weights;calisthenics(plays golf and tennis), Time (Minutes): 60, Frequency (Times/Week): 5, Weekly Exercise (Minutes/Week): 300, Intensity: Moderate  Diet (meal preparation, eat out, water intake, caffeinated beverages, dairy products, fruits and vegetables): in general, a "healthy" diet  , well balanced. eats a variety of fruits and vegetables daily, limits salt, fat/cholesterol,  sugar,carbohydrates,caffeine, drinks 6-8 glasses of water daily.  Goals    . Patient Stated     Continue to exercise to build up my strength so I can continue to play golf, play tennis and keep up with my wife.       Fall Risk Fall Risk  06/29/2018 05/01/2017 03/24/2017 01/18/2016 09/03/2015  Falls in the past year? No No No No No  Comment - Emmi Telephone Survey: data to providers prior to load - - -  Number falls in past yr: - - - - -  Injury with Fall? - - - - -    Depression Screen PHQ 2/9 Scores 06/29/2018 03/24/2017 03/24/2017 01/18/2016  PHQ - 2 Score 0 0 0 0  PHQ- 9 Score - 3 - -    Cognitive Function       Ad8 score reviewed for issues:  Issues making decisions: no  Less interest in hobbies / activities: no  Repeats questions, stories (family complaining): no  Trouble using ordinary gadgets (microwave, computer, phone):no  Forgets the month or year: no  Mismanaging finances: no  Remembering appts: no  Daily problems with thinking and/or memory: no Ad8 score is= 0  Immunization History  Administered Date(s) Administered  . Influenza Whole 08/07/2009  . Influenza, High Dose Seasonal PF 07/05/2015  . Influenza,inj,Quad PF,6+ Mos 09/20/2013  . Influenza-Unspecified 07/05/2016, 07/01/2017  . Pneumococcal Conjugate-13 07/05/2016  . Pneumococcal Polysaccharide-23 12/27/2010  . Td 12/21/2009  . Zoster 04/15/2010   Screening Tests Health Maintenance  Topic Date Due  . INFLUENZA VACCINE  05/06/2018  . TETANUS/TDAP  12/22/2019  . COLONOSCOPY  08/04/2022  . Hepatitis C Screening  Completed  . PNA vac Low  Risk Adult  Completed      Plan:     Continue doing brain stimulating activities (puzzles, reading, adult coloring books, staying active) to keep memory sharp.   Continue to eat heart healthy diet (full of fruits, vegetables, whole grains, lean protein, water--limit salt, fat, and sugar intake) and increase physical activity as tolerated.  I have  personally reviewed and noted the following in the patient's chart:   . Medical and social history . Use of alcohol, tobacco or illicit drugs  . Current medications and supplements . Functional ability and status . Nutritional status . Physical activity . Advanced directives . List of other physicians . Vitals . Screenings to include cognitive, depression, and falls . Referrals and appointments  In addition, I have reviewed and discussed with patient certain preventive protocols, quality metrics, and best practice recommendations. A written personalized care plan for preventive services as well as general preventive health recommendations were provided to patient.     Michiel Cowboy, RN  06/29/2018    Medical screening examination/treatment/procedure(s) were performed by non-physician practitioner and as supervising physician I was immediately available for consultation/collaboration. I agree with above. Binnie Rail, MD

## 2018-06-29 ENCOUNTER — Ambulatory Visit (INDEPENDENT_AMBULATORY_CARE_PROVIDER_SITE_OTHER): Payer: Medicare Other | Admitting: *Deleted

## 2018-06-29 VITALS — BP 122/82 | HR 55 | Resp 18 | Ht 66.0 in | Wt 149.0 lb

## 2018-06-29 DIAGNOSIS — Z23 Encounter for immunization: Secondary | ICD-10-CM | POA: Diagnosis not present

## 2018-06-29 DIAGNOSIS — Z0000 Encounter for general adult medical examination without abnormal findings: Secondary | ICD-10-CM

## 2018-06-29 NOTE — Patient Instructions (Addendum)
Continue doing brain stimulating activities (puzzles, reading, adult coloring books, staying active) to keep memory sharp.   Continue to eat heart healthy diet (full of fruits, vegetables, whole grains, lean protein, water--limit salt, fat, and sugar intake) and increase physical activity as tolerated.   James [name] , Thank you for taking time to come for your Medicare Wellness Visit. I appreciate your ongoing commitment to your health goals. Please review the following plan we discussed and let me know if I can assist you in the future.   These are the goals we discussed: Goals    . Patient Stated     Continue to exercise to build up my strength so I can continue to play golf, play tennis and keep up with my wife.       This is a list of the screening recommended for you and due dates:  Health Maintenance  Topic Date Due  . Flu Shot  05/06/2018  . Tetanus Vaccine  12/22/2019  . Colon Cancer Screening  08/04/2022  .  Hepatitis C: One time screening is recommended by Center for Disease Control  (CDC) for  adults born from [birth year] through 1966.   Completed  . Pneumonia vaccines  Completed     Health Maintenance, Male A healthy lifestyle and preventive care is important for your health and wellness. Ask your health care provider about what schedule of regular examinations is right for you. What should I know about weight and diet? Eat a Healthy Diet  Eat plenty of vegetables, fruits, whole grains, low-fat dairy products, and lean protein.  Do not eat a lot of foods high in solid fats, added sugars, or salt.  Maintain a Healthy Weight Regular exercise can help you achieve or maintain a healthy weight. You should:  Do at least 150 minutes of exercise each week. The exercise should increase your heart rate and make you sweat (moderate-intensity exercise).  Do strength-training exercises at least twice a week.  Watch Your Levels of Cholesterol and Blood Lipids  Have your blood tested  for lipids and cholesterol every 5 years starting at 73 years of age. If you are at high risk for heart disease, you should start having your blood tested when you are 73 years old. You may need to have your cholesterol levels checked more often if: ? Your lipid or cholesterol levels are high. ? You are older than 73 years of age. ? You are at high risk for heart disease.  What should I know about cancer screening? Many types of cancers can be detected early and may often be prevented. Lung Cancer  You should be screened every year for lung cancer if: ? You are a current smoker who has smoked for at least 30 years. ? You are a former smoker who has quit within the past 15 years.  Talk to your health care provider about your screening options, when you should start screening, and how often you should be screened.  Colorectal Cancer  Routine colorectal cancer screening usually begins at 73 years of age and should be repeated every 5-10 years until you are 73 years old. You may need to be screened more often if early forms of precancerous polyps or small growths are found. Your health care provider may recommend screening at an earlier age if you have risk factors for colon cancer.  Your health care provider may recommend using home test kits to check for hidden blood in the stool.  A small camera at  the end of a tube can be used to examine your colon (sigmoidoscopy or colonoscopy). This checks for the earliest forms of colorectal cancer.  Prostate and Testicular Cancer  Depending on your age and overall health, your health care provider may do certain tests to screen for prostate and testicular cancer.  Talk to your health care provider about any symptoms or concerns you have about testicular or prostate cancer.  Skin Cancer  Check your skin from head to toe regularly.  Tell your health care provider about any new moles or changes in moles, especially if: ? There is a change in a  mole's size, shape, or color. ? You have a mole that is larger than a pencil eraser.  Always use sunscreen. Apply sunscreen liberally and repeat throughout the day.  Protect yourself by wearing long sleeves, pants, a wide-brimmed hat, and sunglasses when outside.  What should I know about heart disease, diabetes, and high blood pressure?  If you are 58-43 years of age, have your blood pressure checked every 3-5 years. If you are 59 years of age or older, have your blood pressure checked every year. You should have your blood pressure measured twice-once when you are at a hospital or clinic, and once when you are not at a hospital or clinic. Record the average of the two measurements. To check your blood pressure when you are not at a hospital or clinic, you can use: ? An automated blood pressure machine at a pharmacy. ? A home blood pressure monitor.  Talk to your health care provider about your target blood pressure.  If you are between 36-6 years old, ask your health care provider if you should take aspirin to prevent heart disease.  Have regular diabetes screenings by checking your fasting blood sugar level. ? If you are at a normal weight and have a low risk for diabetes, have this test once every three years after the age of 57. ? If you are overweight and have a high risk for diabetes, consider being tested at a younger age or more often.  A one-time screening for abdominal aortic aneurysm (AAA) by ultrasound is recommended for men aged 49-75 years who are current or former smokers. What should I know about preventing infection? Hepatitis B If you have a higher risk for hepatitis B, you should be screened for this virus. Talk with your health care provider to find out if you are at risk for hepatitis B infection. Hepatitis C Blood testing is recommended for:  Everyone born from [birth year] through 1966.  Anyone with known risk factors for hepatitis C.  Sexually Transmitted Diseases  (STDs)  You should be screened each year for STDs including gonorrhea and chlamydia if: ? You are sexually active and are younger than 73 years of age. ? You are older than 73 years of age and your health care provider tells you that you are at risk for this type of infection. ? Your sexual activity has changed since you were last screened and you are at an increased risk for chlamydia or gonorrhea. Ask your health care provider if you are at risk.  Talk with your health care provider about whether you are at high risk of being infected with HIV. Your health care provider may recommend a prescription medicine to help prevent HIV infection.  What else can I do?  Schedule regular health, dental, and eye exams.  Stay current with your vaccines (immunizations).  Do not use any tobacco products, such  as cigarettes, chewing tobacco, and e-cigarettes. If you need help quitting, ask your health care provider.  Limit alcohol intake to no more than 2 drinks per day. One drink equals 12 ounces of beer, 5 ounces of Ameka Krigbaum, or 1 ounces of hard liquor.  Do not use street drugs.  Do not share needles.  Ask your health care provider for help if you need support or information about quitting drugs.  Tell your health care provider if you often feel depressed.  Tell your health care provider if you have ever been abused or do not feel safe at home. This information is not intended to replace advice given to you by your health care provider. Make sure you discuss any questions you have with your health care provider. Document Released: 03/20/2008 Document Revised: 05/21/2016 Document Reviewed: 06/26/2015 Elsevier Interactive Patient Education  2018 [place].  Influenza Virus Vaccine injection What is this medicine? INFLUENZA VIRUS VACCINE (in floo EN zuh VAHY ruhs vak SEEN) helps to reduce the risk of getting influenza also known as the flu. The vaccine only helps protect you against some strains  of the flu. This medicine may be used for other purposes; ask your health care provider or pharmacist if you have questions. COMMON BRAND NAME(S): Afluria, Agriflu, Alfuria, FLUAD, Fluarix, Fluarix Quadrivalent, Flublok, Flublok Quadrivalent, FLUCELVAX, Flulaval, Fluvirin, Fluzone, Fluzone High-Dose, Fluzone Intradermal What should I tell my health care provider before I take this medicine? They need to know if you have any of these conditions: -bleeding disorder like hemophilia -fever or infection -Guillain-Barre syndrome or other neurological problems -immune system problems -infection with the human immunodeficiency virus (HIV) or AIDS -low blood platelet counts -multiple sclerosis -an unusual or allergic reaction to influenza virus vaccine, latex, other medicines, foods, dyes, or preservatives. Different brands of vaccines contain different allergens. Some may contain latex or eggs. Talk to your doctor about your allergies to make sure that you get the right vaccine. -pregnant or trying to get pregnant -breast-feeding How should I use this medicine? This vaccine is for injection into a muscle or under the skin. It is given by a health care professional. A copy of Vaccine Information Statements will be given before each vaccination. Read this sheet carefully each time. The sheet may change frequently. Talk to your healthcare provider to see which vaccines are right for you. Some vaccines should not be used in all age groups. Overdosage: If you think you have taken too much of this medicine contact a poison control center or emergency room at once. NOTE: This medicine is only for you. Do not share this medicine with others. What if I miss a dose? This does not apply. What may interact with this medicine? -chemotherapy or radiation therapy -medicines that lower your immune system like etanercept, anakinra, infliximab, and adalimumab -medicines that treat or prevent blood clots like  warfarin -phenytoin -steroid medicines like prednisone or cortisone -theophylline -vaccines This list may not describe all possible interactions. Give your health care provider a list of all the medicines, herbs, non-prescription drugs, or dietary supplements you use. Also tell them if you smoke, drink alcohol, or use illegal drugs. Some items may interact with your medicine. What should I watch for while using this medicine? Report any side effects that do not go away within 3 days to your doctor or health care professional. Call your health care provider if any unusual symptoms occur within 6 weeks of receiving this vaccine. You may still catch the flu, but the  illness is not usually as bad. You cannot get the flu from the vaccine. The vaccine will not protect against colds or other illnesses that may cause fever. The vaccine is needed every year. What side effects may I notice from receiving this medicine? Side effects that you should report to your doctor or health care professional as soon as possible: -allergic reactions like skin rash, itching or hives, swelling of the face, lips, or tongue Side effects that usually do not require medical attention (report to your doctor or health care professional if they continue or are bothersome): -fever -headache -muscle aches and pains -pain, tenderness, redness, or swelling at the injection site -tiredness This list may not describe all possible side effects. Call your doctor for medical advice about side effects. You may report side effects to FDA at 1-800-FDA-1088. Where should I keep my medicine? The vaccine will be given by a health care professional in a clinic, pharmacy, doctor's office, or other health care setting. You will not be given vaccine doses to store at home. NOTE: This sheet is a summary. It may not cover all possible information. If you have questions about this medicine, talk to your doctor, pharmacist, or health care  provider.  2018 Elsevier/Gold Standard (2015-04-13 10:07:28)

## 2018-07-07 ENCOUNTER — Other Ambulatory Visit: Payer: Self-pay | Admitting: Internal Medicine

## 2018-07-07 DIAGNOSIS — E538 Deficiency of other specified B group vitamins: Secondary | ICD-10-CM

## 2018-07-07 DIAGNOSIS — E782 Mixed hyperlipidemia: Secondary | ICD-10-CM

## 2018-07-07 DIAGNOSIS — R739 Hyperglycemia, unspecified: Secondary | ICD-10-CM | POA: Insufficient documentation

## 2018-07-07 DIAGNOSIS — D696 Thrombocytopenia, unspecified: Secondary | ICD-10-CM

## 2018-07-08 DIAGNOSIS — L853 Xerosis cutis: Secondary | ICD-10-CM | POA: Diagnosis not present

## 2018-07-08 DIAGNOSIS — L570 Actinic keratosis: Secondary | ICD-10-CM | POA: Diagnosis not present

## 2018-07-08 DIAGNOSIS — D2262 Melanocytic nevi of left upper limb, including shoulder: Secondary | ICD-10-CM | POA: Diagnosis not present

## 2018-07-08 DIAGNOSIS — D3617 Benign neoplasm of peripheral nerves and autonomic nervous system of trunk, unspecified: Secondary | ICD-10-CM | POA: Diagnosis not present

## 2018-07-08 DIAGNOSIS — L821 Other seborrheic keratosis: Secondary | ICD-10-CM | POA: Diagnosis not present

## 2018-07-08 DIAGNOSIS — Z85820 Personal history of malignant melanoma of skin: Secondary | ICD-10-CM | POA: Diagnosis not present

## 2018-07-08 DIAGNOSIS — D224 Melanocytic nevi of scalp and neck: Secondary | ICD-10-CM | POA: Diagnosis not present

## 2018-07-08 DIAGNOSIS — Z85828 Personal history of other malignant neoplasm of skin: Secondary | ICD-10-CM | POA: Diagnosis not present

## 2018-07-08 DIAGNOSIS — D692 Other nonthrombocytopenic purpura: Secondary | ICD-10-CM | POA: Diagnosis not present

## 2018-07-09 ENCOUNTER — Other Ambulatory Visit (INDEPENDENT_AMBULATORY_CARE_PROVIDER_SITE_OTHER): Payer: Medicare Other

## 2018-07-09 DIAGNOSIS — E782 Mixed hyperlipidemia: Secondary | ICD-10-CM | POA: Diagnosis not present

## 2018-07-09 DIAGNOSIS — E538 Deficiency of other specified B group vitamins: Secondary | ICD-10-CM | POA: Diagnosis not present

## 2018-07-09 DIAGNOSIS — D696 Thrombocytopenia, unspecified: Secondary | ICD-10-CM | POA: Diagnosis not present

## 2018-07-09 DIAGNOSIS — R739 Hyperglycemia, unspecified: Secondary | ICD-10-CM | POA: Diagnosis not present

## 2018-07-09 LAB — CBC WITH DIFFERENTIAL/PLATELET
Basophils Absolute: 0 10*3/uL (ref 0.0–0.1)
Basophils Relative: 0.6 % (ref 0.0–3.0)
EOS PCT: 6.4 % — AB (ref 0.0–5.0)
Eosinophils Absolute: 0.2 10*3/uL (ref 0.0–0.7)
HEMATOCRIT: 39.7 % (ref 39.0–52.0)
HEMOGLOBIN: 13.6 g/dL (ref 13.0–17.0)
LYMPHS ABS: 1.2 10*3/uL (ref 0.7–4.0)
Lymphocytes Relative: 33.4 % (ref 12.0–46.0)
MCHC: 34.1 g/dL (ref 30.0–36.0)
MCV: 96.1 fl (ref 78.0–100.0)
MONOS PCT: 10.4 % (ref 3.0–12.0)
Monocytes Absolute: 0.4 10*3/uL (ref 0.1–1.0)
NEUTROS PCT: 49.2 % (ref 43.0–77.0)
Neutro Abs: 1.7 10*3/uL (ref 1.4–7.7)
Platelets: 149 10*3/uL — ABNORMAL LOW (ref 150.0–400.0)
RBC: 4.13 Mil/uL — AB (ref 4.22–5.81)
RDW: 14.9 % (ref 11.5–15.5)
WBC: 3.5 10*3/uL — ABNORMAL LOW (ref 4.0–10.5)

## 2018-07-09 LAB — COMPREHENSIVE METABOLIC PANEL
ALK PHOS: 59 U/L (ref 39–117)
ALT: 9 U/L (ref 0–53)
AST: 21 U/L (ref 0–37)
Albumin: 3.8 g/dL (ref 3.5–5.2)
BUN: 11 mg/dL (ref 6–23)
CO2: 28 meq/L (ref 19–32)
Calcium: 8.9 mg/dL (ref 8.4–10.5)
Chloride: 108 mEq/L (ref 96–112)
Creatinine, Ser: 0.82 mg/dL (ref 0.40–1.50)
GFR: 97.72 mL/min (ref >60.00)
Glucose, Bld: 98 mg/dL (ref 70–99)
POTASSIUM: 4.1 meq/L (ref 3.5–5.1)
Sodium: 142 mEq/L (ref 135–145)
TOTAL PROTEIN: 6.1 g/dL (ref 6.0–8.3)
Total Bilirubin: 0.5 mg/dL (ref 0.2–1.2)

## 2018-07-09 LAB — LIPID PANEL
CHOL/HDL RATIO: 3
Cholesterol: 142 mg/dL (ref 0–200)
HDL: 52.3 mg/dL (ref >39.00)
LDL Cholesterol: 80 mg/dL (ref 0–99)
NONHDL: 89.52
Triglycerides: 46 mg/dL (ref 0.0–149.0)
VLDL: 9.2 mg/dL (ref 0.0–40.0)

## 2018-07-09 LAB — TSH: TSH: 4.44 u[IU]/mL (ref 0.35–4.50)

## 2018-07-09 LAB — HEMOGLOBIN A1C: Hgb A1c MFr Bld: 5.7 % (ref 4.6–6.5)

## 2018-07-09 LAB — VITAMIN B12: Vitamin B-12: 85 pg/mL — ABNORMAL LOW (ref 211–911)

## 2018-07-15 ENCOUNTER — Encounter: Payer: Self-pay | Admitting: Internal Medicine

## 2018-07-15 ENCOUNTER — Ambulatory Visit (INDEPENDENT_AMBULATORY_CARE_PROVIDER_SITE_OTHER): Payer: Medicare Other | Admitting: Internal Medicine

## 2018-07-15 VITALS — BP 120/70 | HR 51 | Temp 97.6°F | Resp 16 | Ht 66.0 in | Wt 150.0 lb

## 2018-07-15 DIAGNOSIS — K222 Esophageal obstruction: Secondary | ICD-10-CM

## 2018-07-15 DIAGNOSIS — E782 Mixed hyperlipidemia: Secondary | ICD-10-CM

## 2018-07-15 DIAGNOSIS — N6320 Unspecified lump in the left breast, unspecified quadrant: Secondary | ICD-10-CM | POA: Diagnosis not present

## 2018-07-15 DIAGNOSIS — E538 Deficiency of other specified B group vitamins: Secondary | ICD-10-CM | POA: Diagnosis not present

## 2018-07-15 MED ORDER — OMEPRAZOLE 40 MG PO CPDR: DELAYED_RELEASE_CAPSULE | ORAL | 3 refills | Status: DC

## 2018-07-15 MED ORDER — PRAVASTATIN SODIUM 40 MG PO TABS: 40.0000 mg | ORAL_TABLET | Freq: Every day | ORAL | 3 refills | Status: DC

## 2018-07-15 NOTE — Assessment & Plan Note (Signed)
B12 level very low B12 injection today Resume oral B12 - he will take daily and will take 2 tabs daily  Recheck B12 level in a few months

## 2018-07-15 NOTE — Assessment & Plan Note (Signed)
Lipids well controlled  Continue statin 

## 2018-07-15 NOTE — Assessment & Plan Note (Addendum)
Left breast lumps 2 o'clock- non tender Imagining ordered with mammo/US

## 2018-07-15 NOTE — Patient Instructions (Addendum)
  We reviewed your blood work   B12 injection today.   Medications reviewed and updated.  Changes include :   none  Your prescription(s) have been submitted to your pharmacy. Please take as directed and contact our office if you believe you are having problem(s) with the medication(s).  Imaging was ordered for your breast.   Please followup in one year, sooner if needed

## 2018-07-15 NOTE — Assessment & Plan Note (Signed)
Taking omeprazole daily No GERD symptoms Continue above

## 2018-07-15 NOTE — Progress Notes (Signed)
Subjective:    Patient ID: James [name], male    DOB: March 12, 1946, 72 y.o.   MRN: [id number]  HPI The patient is here for follow up.  Hyperlipidemia: He is taking his medication daily. He is compliant with a low fat/cholesterol diet. He is exercising regularly. He denies myalgias.   H/o gastric cancer, h/o esophageal stricture  He is taking omeprazole 40 mg daily.  He denies any GERD symptoms.    B12 deficiency:   He is taking his vitamin B12 irregularly.  He denies numbness/tingling, memory changes and fatigue.   Medications and allergies reviewed with patient and updated if appropriate.  Patient Active Problem List   Diagnosis Date Noted  . Hyperglycemia 07/07/2018  . Nonintractable headache 07/08/2017  . 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 Gastric AVM   . Thrombocytopenia ([place]) 05/24/2016  . Malignant melanoma ([place]) 01/08/2016  . Anastomotic stricture of stomach   . Esophageal stricture   . 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 Gastric cancer ([place]) 06/08/2015  . B12 deficiency 06/05/2015  . Prostate cancer ([place]) 10/24/2014  . Unspecified vitamin D deficiency 06/03/2014  . History of transfusion 12/16/2013  . Basal cell cancer 09/17/2012  . HEARING DEFICIT 12/21/2009  . NONSPECIFIC ABNORMAL ELECTROCARDIOGRAM 12/21/2009  . History of colonic polyps 12/04/2008  . HYPERLIPIDEMIA 08/23/2007    Current Outpatient Medications on File Prior to Visit  Medication Sig Dispense Refill  . acetaminophen (TYLENOL) 500 MG tablet Take 1,000 mg by mouth every 6 (six) hours as needed for mild pain.     . ferrous sulfate 325 (65 FE) MG tablet Take 1 tablet (325 mg total) by mouth daily with breakfast.  3  . Multiple Vitamin (MULTIVITAMIN WITH MINERALS) TABS tablet Take 1 tablet by mouth every other day.     . vitamin B-12 (CYANOCOBALAMIN) 1000 MCG tablet Take by mouth.     No current facility-administered medications on file prior to visit.       Past Medical History:  Diagnosis Date  . Abnormal EKG    NS ST-T EKG Changes, (-) Nuclear Stress Test 03/2006  . Allergy   . Anemia    iron deficency  . Arthritis   . B12 deficiency 06/05/2015   B12 Low NL at 271 methtylmalonic acid high  . Cardiac murmur    nothing to be concerned with   . Cataract    extractions  . Duodenal ulcer 1972   transfusion required, 1 unit packed cells  . Dysphagia    history throat/stomach  cancer "can swallow some, uses more pureed type foods"  . Dyspnea    at times  . Esophageal reflux    no currlently  . Gastric cancer ([place]) 07/18/15   invasive adenocarcinoma  . Gastrostomy tube in place [facility]) 2016   intermittent feeding  . Hx of basal cell carcinoma   . Hyperlipemia    no per pt 04-11-16  . Melanoma ([place])   . Prostate cancer ([place]) 09/06/14   last radiation 4'16   . S/P radiation therapy 11/29/2014 through 01/23/2015   Prostate 7800 cGy in 40 sessions, seminal vesicles 5600 cGy in 40 sessions   . Skin cancer, basal cell   . Transfusion history    '72 -s/p surgery for duodenal ulcer, 12/17    Past Surgical History:  Procedure Laterality Date  . BALLOON DILATION N/A 09/12/2015   Procedure: BALLOON DILATION;  Surgeon: Gatha Mayer, MD;  [facility]: [facility];  Service: Endoscopy;  Laterality: N/A;  . BALLOON DILATION N/A 09/21/2015   Procedure: BALLOON DILATION;  Surgeon: Gatha Mayer, MD;  [facility];  Service: Endoscopy;  Laterality: N/A;  . BALLOON DILATION N/A 10/04/2015   Procedure: BALLOON DILATION;  Surgeon: Milus Banister, MD;  [facility];  Service: Endoscopy;  Laterality: N/A;  . BALLOON DILATION N/A 12/10/2015   Procedure: BALLOON DILATION;  Surgeon: Gatha Mayer, MD;  [facility];  Service: Endoscopy;  Laterality: N/A;  . BALLOON DILATION N/A 10/10/2016   Procedure: BALLOON DILATION;  Surgeon: Gatha Mayer, MD;   [facility];  Service: Endoscopy;  Laterality: N/A;  . CATARACT EXTRACTION, BILATERAL Bilateral   . COLONOSCOPY    . colonoscopy with polypectomy  02/2012   Dr Sharlett Iles  . ESOPHAGOGASTRODUODENOSCOPY (EGD) WITH PROPOFOL N/A 08/17/2015   Procedure: ESOPHAGOGASTRODUODENOSCOPY (EGD) WITH PROPOFOL;  Surgeon: Gatha Mayer, MD;  Location: [place];  Service: Endoscopy;  Laterality: N/A;  . ESOPHAGOGASTRODUODENOSCOPY (EGD) WITH PROPOFOL N/A 09/12/2015   Procedure: ESOPHAGOGASTRODUODENOSCOPY (EGD) WITH PROPOFOL;  Surgeon: Gatha Mayer, MD;  [facility];  Service: Endoscopy;  Laterality: N/A;  . ESOPHAGOGASTRODUODENOSCOPY (EGD) WITH PROPOFOL N/A 09/21/2015   Procedure: ESOPHAGOGASTRODUODENOSCOPY (EGD) WITH PROPOFOL;  Surgeon: Gatha Mayer, MD;  [facility];  Service: Endoscopy;  Laterality: N/A;  . ESOPHAGOGASTRODUODENOSCOPY (EGD) WITH PROPOFOL N/A 10/04/2015   Procedure: ESOPHAGOGASTRODUODENOSCOPY (EGD) WITH PROPOFOL;  Surgeon: Milus Banister, MD;  [facility];  Service: Endoscopy;  Laterality: N/A;  possible stricture  . ESOPHAGOGASTRODUODENOSCOPY (EGD) WITH PROPOFOL N/A 12/10/2015   Procedure: ESOPHAGOGASTRODUODENOSCOPY (EGD) WITH PROPOFOL;  Surgeon: Gatha Mayer, MD;  [facility];  Service: Endoscopy;  Laterality: N/A;  . ESOPHAGOGASTRODUODENOSCOPY (EGD) WITH PROPOFOL N/A 02/06/2016   Procedure: ESOPHAGOGASTRODUODENOSCOPY (EGD) WITH PROPOFOL;  Surgeon: Gatha Mayer, MD;  [facility];  Service: Endoscopy;  Laterality: N/A;  . ESOPHAGOGASTRODUODENOSCOPY (EGD) WITH PROPOFOL N/A 10/10/2016   Procedure: ESOPHAGOGASTRODUODENOSCOPY (EGD) WITH PROPOFOL;  Surgeon: Gatha Mayer, MD;  [facility];  Service: Endoscopy;  Laterality: N/A;  needs APC   . EUS N/A 06/14/2015   Procedure: ESOPHAGEAL ENDOSCOPIC ULTRASOUND (EUS) RADIAL;  Surgeon: Milus Banister, MD;  [facility];  Service: Endoscopy;  Laterality: N/A;  .  HOT HEMOSTASIS N/A 10/10/2016   Procedure: HOT HEMOSTASIS (ARGON PLASMA COAGULATION/BICAP);  Surgeon: Gatha Mayer, MD;  [facility];  Service: Endoscopy;  Laterality: N/A;  . KNEE ARTHROSCOPY Right    Right Knee, [place] [facility]  . LAPAROSCOPIC GASTRECTOMY N/A 07/18/2015   Procedure: SUB TOTAL GASTRECTOMY;  Surgeon: Stark Klein, MD;  [facility];  Service: General;  Laterality: N/A;  . LAPAROSCOPIC GASTROSTOMY N/A 07/18/2015   Procedure: FEEDING TUBE PLACEMENT;  Surgeon: Stark Klein, MD;  [facility];  Service: General;  Laterality: N/A;  . LAPAROSCOPY N/A 07/18/2015   Procedure: LAPAROSCOPY DIAGNOSTIC;  Surgeon: Stark Klein, MD;  [facility];  Service: General;  Laterality: N/A;  . MELANOMA EXCISION  02-01-16   abdominal wall  . PROSTATE BIOPSY  09/06/14  . ROTATOR CUFF REPAIR Right    R shoulder, [place]  . SAVORY DILATION N/A 02/06/2016   Procedure: SAVORY DILATION;  Surgeon: Gatha Mayer, MD;  [facility];  Service: Endoscopy;  Laterality: N/A;  . TOTAL SHOULDER ARTHROPLASTY Left 05/07/2017   Procedure: LEFT REVERSE TOTAL SHOULDER ARTHROPLASTY;  Surgeon: Justice Britain, MD;  Location: [place];  Service: Orthopedics;  Laterality: Left;  .  TRIGGER FINGER RELEASE Left 1998  . UPPER GASTROINTESTINAL ENDOSCOPY     x14  . VASECTOMY      Social History   Socioeconomic History  . Marital status: Married    Spouse name: Not on file  . Number of children: 2  . Years of education: Not on file  . Highest education level: Not on file  Occupational History  . Occupation: retired  Scientific laboratory technician  . Financial resource strain: Not hard at all  . Food insecurity:    Worry: Never true    Inability: Never true  . Transportation needs:    Medical: No    Non-medical: No  Tobacco Use  . Smoking status: Former Smoker    Packs/day: 0.50    Years: 5.00    Pack years: 2.50    Types: Cigarettes    Last attempt to quit: 10/07/1967    Years since quitting: 50.8  .  Smokeless tobacco: Never Used  . Tobacco comment: smoked [place] , up to < 1 ppd  Substance and Sexual Activity  . Alcohol use: Yes    Comment: maybe once a week  . Drug use: No  . Sexual activity: Yes  Lifestyle  . Physical activity:    Days per week: 5 days    Minutes per session: 60 min  . Stress: Not at all  Relationships  . Social connections:    Talks on phone: More than three times a week    Gets together: More than three times a week    Attends religious service: 1 to 4 times per year    Active member of club or organization: Yes    Attends meetings of clubs or organizations: More than 4 times per year    Relationship status: Married  Other Topics Concern  . Not on file  Social History Narrative   Married and retired - 1 son and 1 daughter    Lives at [place] and active golfer, tennis player   05/30/2015    Family History  Problem Relation Age of Onset  . Diabetes Father   . Heart attack Father 25  . Liver disease Father        Related to Alcohol Use   . Alcohol abuse Mother   . Cirrhosis Mother        died in her 59s  . Multiple myeloma Brother   . Diabetes Sister   . Colon cancer Neg Hx   . Stomach cancer Neg Hx   . Stroke Neg Hx   . Esophageal cancer Neg Hx   . Rectal cancer Neg Hx     Review of Systems  Constitutional: Negative for chills, fatigue and fever.  Respiratory: Negative for cough, shortness of breath and wheezing.   Cardiovascular: Negative for chest pain, palpitations and leg swelling.  Neurological: Positive for light-headedness (occ) and headaches (occ). Negative for numbness.       Objective:   Vitals:   07/15/18 0904  BP: 120/70  Pulse: (!) 51  Resp: 16  Temp: 97.6 F (36.4 C)  SpO2: 98%   BP Readings from Last 3 Encounters:  07/15/18 120/70  06/29/18 122/82  05/14/18 114/70   Wt Readings from Last 3 Encounters:  07/15/18 150 lb (68 kg)  06/29/18 149 lb (67.6 kg)  05/14/18 150 lb 12.8 oz (68.4 kg)   Body mass  index is 24.21 kg/m.   Physical Exam    Constitutional: Appears well-developed and well-nourished. No distress.  HENT:  Head: Normocephalic and atraumatic.  Neck: Neck supple. No tracheal deviation present. No thyromegaly present.  No cervical lymphadenopathy Cardiovascular: Normal rate, regular rhythm and normal heart sounds.   No murmur heard. No carotid bruit .  No edema Pulmonary/Chest: Effort normal and breath sounds normal. No respiratory distress. No has no wheezes. No rales.  Breasts:  Left breast lumps 2 o'clock that are nontender - few in a row, no skin changes, nipple discharge' right breast w/o lumps, skin changes or nipple d/c Skin: Skin is warm and dry. Not diaphoretic.  Psychiatric: Normal mood and affect. Behavior is normal.      Assessment & Plan:    See Problem List for Assessment and Plan of chronic medical problems.

## 2018-07-16 DIAGNOSIS — Z8546 Personal history of malignant neoplasm of prostate: Secondary | ICD-10-CM | POA: Diagnosis not present

## 2018-07-16 DIAGNOSIS — E538 Deficiency of other specified B group vitamins: Secondary | ICD-10-CM | POA: Diagnosis not present

## 2018-07-16 MED ORDER — CYANOCOBALAMIN 1000 MCG/ML IJ SOLN
1000.0000 ug | Freq: Once | INTRAMUSCULAR | Status: AC
  Administered 2018-07-16: 1000 ug via INTRAMUSCULAR

## 2018-07-16 NOTE — Addendum Note (Signed)
Addended by: Delice Bison E on: 07/16/2018 10:42 AM   Modules accepted: Orders

## 2018-07-30 DIAGNOSIS — N5201 Erectile dysfunction due to arterial insufficiency: Secondary | ICD-10-CM | POA: Diagnosis not present

## 2018-07-30 DIAGNOSIS — Z8546 Personal history of malignant neoplasm of prostate: Secondary | ICD-10-CM | POA: Diagnosis not present

## 2018-08-03 ENCOUNTER — Telehealth: Payer: Self-pay | Admitting: *Deleted

## 2018-08-03 ENCOUNTER — Telehealth: Payer: Self-pay | Admitting: Oncology

## 2018-08-03 ENCOUNTER — Inpatient Hospital Stay (HOSPITAL_BASED_OUTPATIENT_CLINIC_OR_DEPARTMENT_OTHER): Payer: Medicare Other | Admitting: Oncology

## 2018-08-03 ENCOUNTER — Inpatient Hospital Stay: Payer: Medicare Other | Attending: Oncology

## 2018-08-03 VITALS — BP 130/86 | HR 55 | Temp 97.7°F | Resp 17 | Ht 66.0 in | Wt 149.9 lb

## 2018-08-03 DIAGNOSIS — E538 Deficiency of other specified B group vitamins: Secondary | ICD-10-CM

## 2018-08-03 DIAGNOSIS — Z85820 Personal history of malignant melanoma of skin: Secondary | ICD-10-CM

## 2018-08-03 DIAGNOSIS — N6320 Unspecified lump in the left breast, unspecified quadrant: Secondary | ICD-10-CM | POA: Diagnosis not present

## 2018-08-03 DIAGNOSIS — Z923 Personal history of irradiation: Secondary | ICD-10-CM | POA: Insufficient documentation

## 2018-08-03 DIAGNOSIS — Z79899 Other long term (current) drug therapy: Secondary | ICD-10-CM | POA: Insufficient documentation

## 2018-08-03 DIAGNOSIS — Z9221 Personal history of antineoplastic chemotherapy: Secondary | ICD-10-CM | POA: Diagnosis not present

## 2018-08-03 DIAGNOSIS — D509 Iron deficiency anemia, unspecified: Secondary | ICD-10-CM | POA: Insufficient documentation

## 2018-08-03 DIAGNOSIS — Z8546 Personal history of malignant neoplasm of prostate: Secondary | ICD-10-CM

## 2018-08-03 DIAGNOSIS — C161 Malignant neoplasm of fundus of stomach: Secondary | ICD-10-CM

## 2018-08-03 DIAGNOSIS — D6959 Other secondary thrombocytopenia: Secondary | ICD-10-CM

## 2018-08-03 DIAGNOSIS — R1310 Dysphagia, unspecified: Secondary | ICD-10-CM

## 2018-08-03 DIAGNOSIS — D72819 Decreased white blood cell count, unspecified: Secondary | ICD-10-CM | POA: Diagnosis not present

## 2018-08-03 DIAGNOSIS — D696 Thrombocytopenia, unspecified: Secondary | ICD-10-CM

## 2018-08-03 LAB — CBC WITH DIFFERENTIAL (CANCER CENTER ONLY)
Abs Immature Granulocytes: 0.01 10*3/uL (ref 0.00–0.07)
BASOS PCT: 1 %
Basophils Absolute: 0 10*3/uL (ref 0.0–0.1)
EOS ABS: 0.2 10*3/uL (ref 0.0–0.5)
Eosinophils Relative: 5 %
HEMATOCRIT: 38.3 % — AB (ref 39.0–52.0)
Hemoglobin: 12.8 g/dL — ABNORMAL LOW (ref 13.0–17.0)
Immature Granulocytes: 0 %
Lymphocytes Relative: 30 %
Lymphs Abs: 1 10*3/uL (ref 0.7–4.0)
MCH: 32.2 pg (ref 26.0–34.0)
MCHC: 33.4 g/dL (ref 30.0–36.0)
MCV: 96.2 fL (ref 80.0–100.0)
MONO ABS: 0.4 10*3/uL (ref 0.1–1.0)
MONOS PCT: 13 %
Neutro Abs: 1.7 10*3/uL (ref 1.7–7.7)
Neutrophils Relative %: 51 %
PLATELETS: 114 10*3/uL — AB (ref 150–400)
RBC: 3.98 MIL/uL — ABNORMAL LOW (ref 4.22–5.81)
RDW: 13.9 % (ref 11.5–15.5)
WBC Count: 3.3 10*3/uL — ABNORMAL LOW (ref 4.0–10.5)
nRBC: 0 % (ref 0.0–0.2)

## 2018-08-03 NOTE — Telephone Encounter (Signed)
Left VM requesting patient return call with the B12 dose he was taking orally when his B12 level was checked earlier this month. Also asking if the ordering physician has planned any more B12 injections for him? He may need to continue this for awhile per Dr. Benay Spice.

## 2018-08-03 NOTE — Telephone Encounter (Signed)
Gave pt avs and calendar  °

## 2018-08-03 NOTE — Progress Notes (Signed)
[place] OFFICE PROGRESS NOTE   Diagnosis: Gastric cancer  INTERVAL HISTORY:   James [name] returns as scheduled.  He feels well.  Good appetite and energy level.  He has dysphagia only with lettuce.  He has noted a "lump "in the left breast.  He Dr. Quay Burow and is scheduled for a mammogram.  He was also diagnosed with B12 deficiency by Dr. Quay Burow.  He is taking oral vitamin B12.  Objective:  Vital signs in last 24 hours:  Blood pressure 130/86, pulse (!) 55, temperature 97.7 F (36.5 C), temperature source Oral, resp. rate 17, height 5\' 6"  (1.676 m), weight 149 lb 14.4 oz (68 kg), SpO2 99 %.    HEENT: Neck without mass Lymphatics: No cervical, supraclavicular, or inguinal nodes.  "Shotty "bilateral axillary nodes. Resp: Lungs clear bilaterally Cardio: Regular rate and rhythm with premature beats GI: No hepatosplenomegaly, no mass, indirect the upper aspect of the midline scar Vascular: No leg edema Breasts: Symmetric fibroglandular tissue, no discrete mass, apparent 1 cm soft fibrofatty tissue in the area of palpable concern    Lab Results:  Lab Results  Component Value Date   WBC 3.3 (L) 08/03/2018   HGB 12.8 (L) 08/03/2018   HCT 38.3 (L) 08/03/2018   MCV 96.2 08/03/2018   PLT 114 (L) 08/03/2018   NEUTROABS 1.7 08/03/2018    CMP  Lab Results  Component Value Date   NA 142 07/09/2018   K 4.1 07/09/2018   CL 108 07/09/2018   CO2 28 07/09/2018   GLUCOSE 98 07/09/2018   BUN 11 07/09/2018   CREATININE 0.82 07/09/2018   CALCIUM 8.9 07/09/2018   PROT 6.1 07/09/2018   ALBUMIN 3.8 07/09/2018   AST 21 07/09/2018   ALT 9 07/09/2018   ALKPHOS 59 07/09/2018   BILITOT 0.5 07/09/2018   GFRNONAA 93 03/27/2017   GFRAA 107 03/27/2017     Medications: I have reviewed the patient's current medications.   Assessment/Plan: 1. Gastric cancer-adenocarcinoma of the gastric fundus/cardia ? Staging CT scans 06/07/2015 with no evidence of distant metastatic  disease, tiny nonspecific pulmonary nodules and liver lesions-likely benign ? Subtotal gastrectomy and placement of a jejunostomy feeding tube 07/18/2015 for a T2,N0 tumor with negative surgical margins ? Initiation of adjuvant 5-FU/leucovorin 08/28/2015, status post 3 weekly treatments completed 09/13/2015 ? Initiation of radiation and concurrent Xeloda 10/29/2015 ? 11/26/2015 Xeloda dose reduced to 1000 mg twice daily due to progressive thrombocytopenia ? Radiation completed 12/06/2015 ? 5-FU/leucovorin resumed 12/21/2015   2. 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  Upper endoscopy 10/02/2016 with findings of multiple small bleeding angioectasiasstatus post argon plasma coagulation on 10/10/2016  3. Vitamin B-12 deficiency-Low-level confirmed 01/04/2016 and again 07/09/2018, maintained on oral vitamin B-12 replacement  4. 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 Carlean Purl 11/01/2015 and 12/10/2015; EGD 10/02/2016 with finding of esophageal stenosis at the gastroesophageal junction status post dilation 10/10/2016 and 01/21/2018  7. Mild neutropenia/thrombocytopenia secondary to chemotherapy. Progressive thrombocytopenia 11/26/2015. Xeloda dose reduced to 1000 mg twice daily. Counts stable to improved 01/04/2016.  Persistent mild thrombocytopenia  8.Erythema/tenderness at the jejunostomy feeding tube site 09/20/2015, improved  9. Feeding tube removed 01/04/2016  10. Melanoma left lower abdominal wall status  post wide excision.     Disposition: James [name]  remains in clinical remission from gastric cancer.  He has persistent mild leukopenia/thrombocytopenia, potentially related to treatment for gastric cancer or B12 deficiency.  He will continue oral vitamin B12 replacement.  We will check a B12 level in 2 months.  He will return for an office visit in 6 months.  He will seek medical attention if the area of palpable concern in the left breast enlarges.  He is scheduled for a mammogram and ultrasound 08/05/2018.  15 minutes were spent with the patient today.  The majority of the time was used for counseling and coordination of care.  Betsy Coder, MD  08/03/2018  8:32 AM

## 2018-08-05 ENCOUNTER — Ambulatory Visit
Admission: RE | Admit: 2018-08-05 | Discharge: 2018-08-05 | Disposition: A | Discharge status: Home / Self Care | Payer: Medicare Other | Source: Ambulatory Visit | Attending: Internal Medicine | Admitting: Internal Medicine

## 2018-08-05 DIAGNOSIS — N6320 Unspecified lump in the left breast, unspecified quadrant: Secondary | ICD-10-CM

## 2018-08-05 DIAGNOSIS — N6321 Unspecified lump in the left breast, upper outer quadrant: Secondary | ICD-10-CM | POA: Diagnosis not present

## 2018-08-12 NOTE — Telephone Encounter (Signed)
Patient confirms he was on B12 2000 mcg when B12 was checked in October. He will contact Dr. Quay Burow to schedule B12 injections monthly per Dr. Gearldine Shown suggestion

## 2018-08-17 ENCOUNTER — Ambulatory Visit (INDEPENDENT_AMBULATORY_CARE_PROVIDER_SITE_OTHER): Payer: Medicare Other | Admitting: *Deleted

## 2018-08-17 DIAGNOSIS — E538 Deficiency of other specified B group vitamins: Secondary | ICD-10-CM

## 2018-08-17 MED ORDER — CYANOCOBALAMIN 1000 MCG/ML IJ SOLN
1000.0000 ug | Freq: Once | INTRAMUSCULAR | Status: AC
  Administered 2018-08-17: 1000 ug via INTRAMUSCULAR

## 2018-08-20 ENCOUNTER — Encounter: Payer: Self-pay | Admitting: Internal Medicine

## 2018-08-20 ENCOUNTER — Ambulatory Visit (INDEPENDENT_AMBULATORY_CARE_PROVIDER_SITE_OTHER): Payer: Medicare Other | Admitting: Internal Medicine

## 2018-08-20 VITALS — BP 120/72 | HR 50 | Temp 98.3°F | Resp 16 | Ht 66.0 in | Wt 151.8 lb

## 2018-08-20 DIAGNOSIS — J0100 Acute maxillary sinusitis, unspecified: Secondary | ICD-10-CM

## 2018-08-20 DIAGNOSIS — J019 Acute sinusitis, unspecified: Secondary | ICD-10-CM | POA: Insufficient documentation

## 2018-08-20 MED ORDER — HYDROCODONE-HOMATROPINE 5-1.5 MG/5ML PO SYRP: 5.0000 mL | ORAL_SOLUTION | Freq: Three times a day (TID) | ORAL | 0 refills | Status: DC | PRN

## 2018-08-20 MED ORDER — AZITHROMYCIN 250 MG PO TABS: ORAL_TABLET | ORAL | 0 refills | Status: DC

## 2018-08-20 NOTE — Progress Notes (Signed)
Subjective:    Patient ID: James [name], male    DOB: Apr 27, 1946, 72 y.o.   MRN: [id number]  HPI He is here for an acute visit for cold symptoms.  His symptoms started over one week ago  He is experiencing congestion, sinus pain, sore throat, productive cough, mild sob, some wheeze, nausea and headaches.  He denies any obvious fever or chills.    He has tried taking mucinex, throat lozenges   Medications and allergies reviewed with patient and updated if appropriate.  Patient Active Problem List   Diagnosis Date Noted  . Left breast lump 07/15/2018  . Hyperglycemia 07/07/2018  . Nonintractable headache 07/08/2017  . 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 Gastric AVM   . Thrombocytopenia ([place]) 05/24/2016  . Malignant melanoma ([place]) 01/08/2016  . Anastomotic stricture of stomach   . Esophageal stricture   . Carcinoma of lesser curvature of stomach ([place]) 07/18/2015  . Gastric cancer ([place]) 06/08/2015  . B12 deficiency 06/05/2015  . Prostate cancer ([place]) 10/24/2014  . Unspecified vitamin D deficiency 06/03/2014  . History of transfusion 12/16/2013  . Basal cell cancer 09/17/2012  . HEARING DEFICIT 12/21/2009  . NONSPECIFIC ABNORMAL ELECTROCARDIOGRAM 12/21/2009  . History of colonic polyps 12/04/2008  . HYPERLIPIDEMIA 08/23/2007    Current Outpatient Medications on File Prior to Visit  Medication Sig Dispense Refill  . acetaminophen (TYLENOL) 500 MG tablet Take 1,000 mg by mouth every 6 (six) hours as needed for mild pain.     . ferrous sulfate 325 (65 FE) MG tablet Take 1 tablet (325 mg total) by mouth daily with breakfast.  3  . Multiple Vitamin (MULTIVITAMIN WITH MINERALS) TABS tablet Take 1 tablet by mouth every other day.     [name] omeprazole (PRILOSEC) 40 MG capsule take 1 capsule by mouth once daily 30 MINUTES BEFORE BREAKFAST/SUPPER.  OKAY TO OPEN AND SPRINKLE ON APPLESAUCE 180 capsule 3  . pravastatin (PRAVACHOL) 40 MG tablet Take 1 tablet (40 mg  total) by mouth at bedtime. 90 tablet 3  . vitamin B-12 (CYANOCOBALAMIN) 1000 MCG tablet 2,000 mcg daily.      No current facility-administered medications on file prior to visit.     Past Medical History:  Diagnosis Date  . Abnormal EKG    NS ST-T EKG Changes, (-) Nuclear Stress Test 03/2006  . Allergy   . Anemia    iron deficency  . Arthritis   . B12 deficiency 06/05/2015   B12 Low NL at 271 methtylmalonic acid high  . Cardiac murmur    nothing to be concerned with   . Cataract    extractions  . Duodenal ulcer 1972   transfusion required, 1 unit packed cells  . Dysphagia    history throat/stomach  cancer "can swallow some, uses more pureed type foods"  . Dyspnea    at times  . Esophageal reflux    no currlently  . Gastric cancer ([place]) 07/18/15   invasive adenocarcinoma  . Gastrostomy tube in place [facility]) 2016   intermittent feeding  . Hx of basal cell carcinoma   . Hyperlipemia    no per pt 04-11-16  . Melanoma ([place])   . Prostate cancer ([place]) 09/06/14   last radiation 4'16   . S/P radiation therapy 11/29/2014 through 01/23/2015   Prostate 7800 cGy in 40 sessions, seminal vesicles 5600 cGy in 40 sessions   . Skin cancer, basal cell   . Transfusion history    '72 -s/p surgery  for duodenal ulcer, 12/17    Past Surgical History:  Procedure Laterality Date  . BALLOON DILATION N/A 09/12/2015   Procedure: BALLOON DILATION;  Surgeon: Gatha Mayer, MD;  Location: [facility];  Service: Endoscopy;  Laterality: N/A;  . BALLOON DILATION N/A 09/21/2015   Procedure: BALLOON DILATION;  Surgeon: Gatha Mayer, MD;  [facility];  Service: Endoscopy;  Laterality: N/A;  . BALLOON DILATION N/A 10/04/2015   Procedure: BALLOON DILATION;  Surgeon: Milus Banister, MD;  [facility]: [facility];  Service: Endoscopy;  Laterality: N/A;  . BALLOON DILATION N/A 12/10/2015   Procedure: BALLOON DILATION;  Surgeon:  Gatha Mayer, MD;  [facility];  Service: Endoscopy;  Laterality: N/A;  . BALLOON DILATION N/A 10/10/2016   Procedure: BALLOON DILATION;  Surgeon: Gatha Mayer, MD;  [facility];  Service: Endoscopy;  Laterality: N/A;  . CATARACT EXTRACTION, BILATERAL Bilateral   . COLONOSCOPY    . colonoscopy with polypectomy  02/2012   Dr Sharlett Iles  . ESOPHAGOGASTRODUODENOSCOPY (EGD) WITH PROPOFOL N/A 08/17/2015   Procedure: ESOPHAGOGASTRODUODENOSCOPY (EGD) WITH PROPOFOL;  Surgeon: Gatha Mayer, MD;  Location: [place];  Service: Endoscopy;  Laterality: N/A;  . ESOPHAGOGASTRODUODENOSCOPY (EGD) WITH PROPOFOL N/A 09/12/2015   Procedure: ESOPHAGOGASTRODUODENOSCOPY (EGD) WITH PROPOFOL;  Surgeon: Gatha Mayer, MD;  [facility];  Service: Endoscopy;  Laterality: N/A;  . ESOPHAGOGASTRODUODENOSCOPY (EGD) WITH PROPOFOL N/A 09/21/2015   Procedure: ESOPHAGOGASTRODUODENOSCOPY (EGD) WITH PROPOFOL;  Surgeon: Gatha Mayer, MD;  [facility];  Service: Endoscopy;  Laterality: N/A;  . ESOPHAGOGASTRODUODENOSCOPY (EGD) WITH PROPOFOL N/A 10/04/2015   Procedure: ESOPHAGOGASTRODUODENOSCOPY (EGD) WITH PROPOFOL;  Surgeon: Milus Banister, MD;  [facility];  Service: Endoscopy;  Laterality: N/A;  possible stricture  . ESOPHAGOGASTRODUODENOSCOPY (EGD) WITH PROPOFOL N/A 12/10/2015   Procedure: ESOPHAGOGASTRODUODENOSCOPY (EGD) WITH PROPOFOL;  Surgeon: Gatha Mayer, MD;  [facility];  Service: Endoscopy;  Laterality: N/A;  . ESOPHAGOGASTRODUODENOSCOPY (EGD) WITH PROPOFOL N/A 02/06/2016   Procedure: ESOPHAGOGASTRODUODENOSCOPY (EGD) WITH PROPOFOL;  Surgeon: Gatha Mayer, MD;  [facility];  Service: Endoscopy;  Laterality: N/A;  . ESOPHAGOGASTRODUODENOSCOPY (EGD) WITH PROPOFOL N/A 10/10/2016   Procedure: ESOPHAGOGASTRODUODENOSCOPY (EGD) WITH PROPOFOL;  Surgeon: Gatha Mayer, MD;  [facility];  Service: Endoscopy;  Laterality: N/A;  needs APC     . EUS N/A 06/14/2015   Procedure: ESOPHAGEAL ENDOSCOPIC ULTRASOUND (EUS) RADIAL;  Surgeon: Milus Banister, MD;  [facility];  Service: Endoscopy;  Laterality: N/A;  . HOT HEMOSTASIS N/A 10/10/2016   Procedure: HOT HEMOSTASIS (ARGON PLASMA COAGULATION/BICAP);  Surgeon: Gatha Mayer, MD;  [facility];  Service: Endoscopy;  Laterality: N/A;  . KNEE ARTHROSCOPY Right    Right Knee, [place] [facility]  . LAPAROSCOPIC GASTRECTOMY N/A 07/18/2015   Procedure: SUB TOTAL GASTRECTOMY;  Surgeon: Stark Klein, MD;  [facility];  Service: General;  Laterality: N/A;  . LAPAROSCOPIC GASTROSTOMY N/A 07/18/2015   Procedure: FEEDING TUBE PLACEMENT;  Surgeon: Stark Klein, MD;  [facility];  Service: General;  Laterality: N/A;  . LAPAROSCOPY N/A 07/18/2015   Procedure: LAPAROSCOPY DIAGNOSTIC;  Surgeon: Stark Klein, MD;  [facility];  Service: General;  Laterality: N/A;  . MELANOMA EXCISION  02-01-16   abdominal wall  . PROSTATE BIOPSY  09/06/14  . ROTATOR CUFF REPAIR Right    R shoulder, [place]  . SAVORY DILATION N/A 02/06/2016   Procedure: SAVORY DILATION;  Surgeon: Gatha Mayer, MD;  [facility];  Service: Endoscopy;  Laterality: N/A;  . TOTAL SHOULDER ARTHROPLASTY Left 05/07/2017   Procedure: LEFT REVERSE TOTAL SHOULDER ARTHROPLASTY;  Surgeon: Justice Britain, MD;  Location: [facility];  Service: Orthopedics;  Laterality: Left;  . TRIGGER FINGER RELEASE Left 1998  . UPPER GASTROINTESTINAL ENDOSCOPY     x14  . VASECTOMY      Social History   Socioeconomic History  . Marital status: Married    Spouse name: Not on file  . Number of children: 2  . Years of education: Not on file  . Highest education level: Not on file  Occupational History  . Occupation: retired  Scientific laboratory technician  . Financial resource strain: Not hard at all  . Food insecurity:    Worry: Never true    Inability: Never true  . Transportation needs:    Medical: No    Non-medical: No  Tobacco Use  .  Smoking status: Former Smoker    Packs/day: 0.50    Years: 5.00    Pack years: 2.50    Types: Cigarettes    Last attempt to quit: 10/07/1967    Years since quitting: 50.9  . Smokeless tobacco: Never Used  . Tobacco comment: smoked [place] , up to < 1 ppd  Substance and Sexual Activity  . Alcohol use: Yes    Comment: maybe once a week  . Drug use: No  . Sexual activity: Yes  Lifestyle  . Physical activity:    Days per week: 5 days    Minutes per session: 60 min  . Stress: Not at all  Relationships  . Social connections:    Talks on phone: More than three times a week    Gets together: More than three times a week    Attends religious service: 1 to 4 times per year    Active member of club or organization: Yes    Attends meetings of clubs or organizations: More than 4 times per year    Relationship status: Married  Other Topics Concern  . Not on file  Social History Narrative   Married and retired - 1 son and 1 daughter    Lives at [place] and active golfer, tennis player   05/30/2015    Family History  Problem Relation Age of Onset  . Diabetes Father   . Heart attack Father 51  . Liver disease Father        Related to Alcohol Use   . Alcohol abuse Mother   . Cirrhosis Mother        died in her 69s  . Multiple myeloma Brother   . Diabetes Sister   . Colon cancer Neg Hx   . Stomach cancer Neg Hx   . Stroke Neg Hx   . Esophageal cancer Neg Hx   . Rectal cancer Neg Hx     Review of Systems  Constitutional: Negative for chills and fever.  HENT: Positive for congestion, sinus pain and sore throat. Negative for ear pain.   Respiratory: Positive for cough (productive), shortness of breath (mild) and wheezing (mild).   Gastrointestinal: Positive for nausea. Negative for diarrhea.  Musculoskeletal: Negative for myalgias.  Neurological: Positive for headaches. Negative for dizziness and light-headedness.       Objective:   Vitals:   08/20/18 1033  BP: 120/72    Pulse: (!) 50  Resp: 16  Temp: 98.3 F (36.8 C)  SpO2: 97%   Filed Weights   08/20/18 1033  Weight: 151 lb 12.8 oz (  68.9 kg)   Body mass index is 24.5 kg/m.  Wt Readings from Last 3 Encounters:  08/20/18 151 lb 12.8 oz (68.9 kg)  08/03/18 149 lb 14.4 oz (68 kg)  07/15/18 150 lb (68 kg)     Physical Exam GENERAL APPEARANCE: mildly ill appearing, NAD EYES: conjunctiva clear, no icterus HEENT: bilateral tympanic membranes and ear canals normal, oropharynx with mild erythema, b/l maxillary tenderness, no thyromegaly, trachea midline, no cervical or supraclavicular lymphadenopathy LUNGS: Clear to auscultation without wheeze or crackles, unlabored breathing, good air entry bilaterally CARDIOVASCULAR: Normal S1,S2 without murmurs, no edema SKIN: warm, dry        Assessment & Plan:   See Problem List for Assessment and Plan of chronic medical problems.

## 2018-08-20 NOTE — Patient Instructions (Signed)
Take the antibiotic as prescribed - complete the entire course.  Use the cough syrup as needed.  Continue over the counter cold medication, advil and tylenol.  Increase your fluids and rest.    Call if no improvement    

## 2018-08-20 NOTE — Assessment & Plan Note (Signed)
Likely bacterial with developing bronchitis Start zpak Prescription cough syrup otc cold medications Rest, fluid Call if no improvement

## 2018-09-16 ENCOUNTER — Ambulatory Visit (INDEPENDENT_AMBULATORY_CARE_PROVIDER_SITE_OTHER): Payer: Medicare Other

## 2018-09-16 DIAGNOSIS — E538 Deficiency of other specified B group vitamins: Secondary | ICD-10-CM | POA: Diagnosis not present

## 2018-09-16 MED ORDER — CYANOCOBALAMIN 1000 MCG/ML IJ SOLN
1000.0000 ug | Freq: Once | INTRAMUSCULAR | Status: AC
  Administered 2018-09-16: 1000 ug via INTRAMUSCULAR

## 2018-09-16 NOTE — Progress Notes (Signed)
b12 Injection given.   Aydian Dimmick J Mayan Kloepfer, MD  

## 2018-09-23 ENCOUNTER — Inpatient Hospital Stay: Payer: Medicare Other | Attending: Oncology

## 2018-09-23 DIAGNOSIS — C161 Malignant neoplasm of fundus of stomach: Secondary | ICD-10-CM | POA: Insufficient documentation

## 2018-09-23 DIAGNOSIS — E538 Deficiency of other specified B group vitamins: Secondary | ICD-10-CM | POA: Insufficient documentation

## 2018-09-23 LAB — CBC WITH DIFFERENTIAL (CANCER CENTER ONLY)
Abs Immature Granulocytes: 0.01 10*3/uL (ref 0.00–0.07)
BASOS ABS: 0 10*3/uL (ref 0.0–0.1)
Basophils Relative: 1 %
EOS PCT: 4 %
Eosinophils Absolute: 0.1 10*3/uL (ref 0.0–0.5)
HEMATOCRIT: 38.9 % — AB (ref 39.0–52.0)
HEMOGLOBIN: 13 g/dL (ref 13.0–17.0)
Immature Granulocytes: 0 %
LYMPHS ABS: 1.1 10*3/uL (ref 0.7–4.0)
LYMPHS PCT: 32 %
MCH: 31.9 pg (ref 26.0–34.0)
MCHC: 33.4 g/dL (ref 30.0–36.0)
MCV: 95.6 fL (ref 80.0–100.0)
MONO ABS: 0.5 10*3/uL (ref 0.1–1.0)
MONOS PCT: 13 %
NRBC: 0 % (ref 0.0–0.2)
Neutro Abs: 1.7 10*3/uL (ref 1.7–7.7)
Neutrophils Relative %: 50 %
Platelet Count: 114 10*3/uL — ABNORMAL LOW (ref 150–400)
RBC: 4.07 MIL/uL — ABNORMAL LOW (ref 4.22–5.81)
RDW: 14 % (ref 11.5–15.5)
WBC Count: 3.4 10*3/uL — ABNORMAL LOW (ref 4.0–10.5)

## 2018-09-23 LAB — VITAMIN B12: Vitamin B-12: 903 pg/mL (ref 180–914)

## 2018-09-28 ENCOUNTER — Telehealth: Payer: Self-pay | Admitting: *Deleted

## 2018-09-28 NOTE — Telephone Encounter (Signed)
-----   Message from Ladell Pier, MD sent at 09/23/2018  1:41 PM EST ----- Please call patient, b12 is normal, hb is normal, f/u as scheduled

## 2018-09-28 NOTE — Telephone Encounter (Signed)
TCT patient regarding lab results from 09/23/18.  Spoke with patient and informed him that his B12 and HGB were normal.. Pt voiced understanding and appreciation for the call. He is aware of his follow up appts in April 2020. No questions or concerns.

## 2018-10-19 ENCOUNTER — Ambulatory Visit (INDEPENDENT_AMBULATORY_CARE_PROVIDER_SITE_OTHER): Payer: Medicare Other | Admitting: *Deleted

## 2018-10-19 DIAGNOSIS — E538 Deficiency of other specified B group vitamins: Secondary | ICD-10-CM | POA: Diagnosis not present

## 2018-10-19 MED ORDER — CYANOCOBALAMIN 1000 MCG/ML IJ SOLN
1000.0000 ug | Freq: Once | INTRAMUSCULAR | Status: AC
  Administered 2018-10-19: 1000 ug via INTRAMUSCULAR

## 2018-10-19 NOTE — Progress Notes (Signed)
b12 Injection given.   James [name] [name], MD  

## 2018-11-09 DIAGNOSIS — Z961 Presence of intraocular lens: Secondary | ICD-10-CM | POA: Diagnosis not present

## 2018-11-19 ENCOUNTER — Ambulatory Visit (INDEPENDENT_AMBULATORY_CARE_PROVIDER_SITE_OTHER): Payer: Medicare Other

## 2018-11-19 DIAGNOSIS — E538 Deficiency of other specified B group vitamins: Secondary | ICD-10-CM | POA: Diagnosis not present

## 2018-11-19 MED ORDER — CYANOCOBALAMIN 1000 MCG/ML IJ SOLN
1000.0000 ug | Freq: Once | INTRAMUSCULAR | Status: AC
  Administered 2018-11-19: 1000 ug via INTRAMUSCULAR

## 2018-11-19 NOTE — Progress Notes (Signed)
Medical screening examination/treatment/procedure(s) were performed by non-physician practitioner and as supervising physician I was immediately available for consultation/collaboration. I agree with above. James John, MD   

## 2018-12-21 ENCOUNTER — Ambulatory Visit (INDEPENDENT_AMBULATORY_CARE_PROVIDER_SITE_OTHER): Payer: Medicare Other | Admitting: *Deleted

## 2018-12-21 ENCOUNTER — Other Ambulatory Visit: Payer: Self-pay

## 2018-12-21 DIAGNOSIS — E538 Deficiency of other specified B group vitamins: Secondary | ICD-10-CM | POA: Diagnosis not present

## 2018-12-21 MED ORDER — CYANOCOBALAMIN 1000 MCG/ML IJ SOLN
1000.0000 ug | Freq: Once | INTRAMUSCULAR | Status: AC
  Administered 2018-12-21: 1000 ug via INTRAMUSCULAR

## 2019-01-05 NOTE — Progress Notes (Signed)
b12 Injection given.   Chrisanna Mishra J Belky Mundo, MD  

## 2019-01-21 ENCOUNTER — Ambulatory Visit: Payer: Medicare Other

## 2019-01-31 ENCOUNTER — Telehealth: Payer: Self-pay | Admitting: *Deleted

## 2019-01-31 NOTE — Telephone Encounter (Addendum)
Left VM requesting to move his 02/03/19 L/OV out 1 month due to COVID precautions being taken in office. Requested return call to confirm. Patient called back to confirm he agrees to reschedule for 1 month.

## 2019-02-01 ENCOUNTER — Telehealth: Payer: Self-pay | Admitting: Oncology

## 2019-02-01 NOTE — Telephone Encounter (Signed)
Rescheduled 4/30 appt for one month out per sch msg. Mailed printout.

## 2019-02-03 ENCOUNTER — Inpatient Hospital Stay: Payer: Medicare Other | Admitting: Oncology

## 2019-02-03 ENCOUNTER — Inpatient Hospital Stay: Payer: Medicare Other

## 2019-03-02 ENCOUNTER — Other Ambulatory Visit: Payer: Self-pay

## 2019-03-02 ENCOUNTER — Ambulatory Visit (INDEPENDENT_AMBULATORY_CARE_PROVIDER_SITE_OTHER): Payer: Medicare Other

## 2019-03-02 DIAGNOSIS — E538 Deficiency of other specified B group vitamins: Secondary | ICD-10-CM | POA: Diagnosis not present

## 2019-03-02 MED ORDER — CYANOCOBALAMIN 1000 MCG/ML IJ SOLN
1000.0000 ug | Freq: Once | INTRAMUSCULAR | Status: AC
  Administered 2019-03-02: 1000 ug via INTRAMUSCULAR

## 2019-03-02 NOTE — Progress Notes (Signed)
b12 Injection given.   Stacy J Burns, MD  

## 2019-03-03 ENCOUNTER — Telehealth: Payer: Self-pay | Admitting: Oncology

## 2019-03-03 NOTE — Telephone Encounter (Signed)
Rescheduled appt per md scheduling log.  Patient aware of new appt date and time.

## 2019-03-07 ENCOUNTER — Inpatient Hospital Stay: Payer: Medicare Other

## 2019-03-07 ENCOUNTER — Inpatient Hospital Stay: Payer: Medicare Other | Admitting: Oncology

## 2019-03-30 DIAGNOSIS — D1801 Hemangioma of skin and subcutaneous tissue: Secondary | ICD-10-CM | POA: Diagnosis not present

## 2019-03-30 DIAGNOSIS — D2239 Melanocytic nevi of other parts of face: Secondary | ICD-10-CM | POA: Diagnosis not present

## 2019-03-30 DIAGNOSIS — D3617 Benign neoplasm of peripheral nerves and autonomic nervous system of trunk, unspecified: Secondary | ICD-10-CM | POA: Diagnosis not present

## 2019-03-30 DIAGNOSIS — L570 Actinic keratosis: Secondary | ICD-10-CM | POA: Diagnosis not present

## 2019-03-30 DIAGNOSIS — D485 Neoplasm of uncertain behavior of skin: Secondary | ICD-10-CM | POA: Diagnosis not present

## 2019-03-30 DIAGNOSIS — Z85820 Personal history of malignant melanoma of skin: Secondary | ICD-10-CM | POA: Diagnosis not present

## 2019-03-30 DIAGNOSIS — L821 Other seborrheic keratosis: Secondary | ICD-10-CM | POA: Diagnosis not present

## 2019-03-30 DIAGNOSIS — Z85828 Personal history of other malignant neoplasm of skin: Secondary | ICD-10-CM | POA: Diagnosis not present

## 2019-04-06 ENCOUNTER — Other Ambulatory Visit: Payer: Medicare Other

## 2019-04-11 ENCOUNTER — Other Ambulatory Visit: Payer: Self-pay

## 2019-04-11 ENCOUNTER — Ambulatory Visit (INDEPENDENT_AMBULATORY_CARE_PROVIDER_SITE_OTHER): Payer: Medicare Other

## 2019-04-11 DIAGNOSIS — E538 Deficiency of other specified B group vitamins: Secondary | ICD-10-CM

## 2019-04-11 MED ORDER — CYANOCOBALAMIN 1000 MCG/ML IJ SOLN
1000.0000 ug | Freq: Once | INTRAMUSCULAR | Status: AC
  Administered 2019-04-11: 1000 ug via INTRAMUSCULAR

## 2019-04-11 NOTE — Progress Notes (Signed)
b12 Injection given.   James [name] James [name] James Chrobak, MD  

## 2019-04-12 ENCOUNTER — Telehealth: Payer: Self-pay | Admitting: Oncology

## 2019-04-12 ENCOUNTER — Inpatient Hospital Stay: Payer: Medicare Other | Attending: Oncology

## 2019-04-12 ENCOUNTER — Other Ambulatory Visit: Payer: Self-pay

## 2019-04-12 ENCOUNTER — Inpatient Hospital Stay (HOSPITAL_BASED_OUTPATIENT_CLINIC_OR_DEPARTMENT_OTHER): Payer: Medicare Other | Admitting: Oncology

## 2019-04-12 VITALS — BP 135/73 | HR 58 | Temp 97.7°F | Resp 17 | Ht 66.0 in | Wt 150.6 lb

## 2019-04-12 DIAGNOSIS — C161 Malignant neoplasm of fundus of stomach: Secondary | ICD-10-CM

## 2019-04-12 DIAGNOSIS — R4702 Dysphasia: Secondary | ICD-10-CM | POA: Diagnosis not present

## 2019-04-12 DIAGNOSIS — Z923 Personal history of irradiation: Secondary | ICD-10-CM | POA: Insufficient documentation

## 2019-04-12 DIAGNOSIS — D500 Iron deficiency anemia secondary to blood loss (chronic): Secondary | ICD-10-CM | POA: Insufficient documentation

## 2019-04-12 DIAGNOSIS — Z79899 Other long term (current) drug therapy: Secondary | ICD-10-CM

## 2019-04-12 DIAGNOSIS — D6959 Other secondary thrombocytopenia: Secondary | ICD-10-CM

## 2019-04-12 DIAGNOSIS — E538 Deficiency of other specified B group vitamins: Secondary | ICD-10-CM | POA: Diagnosis not present

## 2019-04-12 DIAGNOSIS — Z8546 Personal history of malignant neoplasm of prostate: Secondary | ICD-10-CM | POA: Diagnosis not present

## 2019-04-12 DIAGNOSIS — Z85820 Personal history of malignant melanoma of skin: Secondary | ICD-10-CM

## 2019-04-12 LAB — CBC WITH DIFFERENTIAL (CANCER CENTER ONLY)
Abs Immature Granulocytes: 0.01 10*3/uL (ref 0.00–0.07)
Basophils Absolute: 0 10*3/uL (ref 0.0–0.1)
Basophils Relative: 1 %
Eosinophils Absolute: 0.2 10*3/uL (ref 0.0–0.5)
Eosinophils Relative: 5 %
HCT: 38.2 % — ABNORMAL LOW (ref 39.0–52.0)
Hemoglobin: 13 g/dL (ref 13.0–17.0)
Immature Granulocytes: 0 %
Lymphocytes Relative: 33 %
Lymphs Abs: 1.3 10*3/uL (ref 0.7–4.0)
MCH: 32.3 pg (ref 26.0–34.0)
MCHC: 34 g/dL (ref 30.0–36.0)
MCV: 95 fL (ref 80.0–100.0)
Monocytes Absolute: 0.4 10*3/uL (ref 0.1–1.0)
Monocytes Relative: 11 %
Neutro Abs: 2 10*3/uL (ref 1.7–7.7)
Neutrophils Relative %: 50 %
Platelet Count: 136 10*3/uL — ABNORMAL LOW (ref 150–400)
RBC: 4.02 MIL/uL — ABNORMAL LOW (ref 4.22–5.81)
RDW: 14.4 % (ref 11.5–15.5)
WBC Count: 3.9 10*3/uL — ABNORMAL LOW (ref 4.0–10.5)
nRBC: 0 % (ref 0.0–0.2)

## 2019-04-12 LAB — VITAMIN B12: Vitamin B-12: 2548 pg/mL — ABNORMAL HIGH (ref 180–914)

## 2019-04-12 NOTE — Telephone Encounter (Signed)
Scheduled per los. Mailed printout  °

## 2019-04-12 NOTE — Progress Notes (Signed)
[place] OFFICE PROGRESS NOTE   Diagnosis: Gastric cancer  INTERVAL HISTORY:   James [name] returns for a scheduled visit.  He feels well.  He only has dysphasia when he takes a large bite of food.  Good appetite.  He is playing golf and tennis.  Objective:  Vital signs in last 24 hours:  Blood pressure 135/73, pulse (!) 58, temperature 97.7 F (36.5 C), temperature source Oral, resp. rate 17, height 5\' 6"  (1.676 m), weight 150 lb 9.6 oz (68.3 kg), SpO2 97 %.   Limited physical examination secondary to distancing with the coded pandemic HEENT: Neck without mass Lymphatics: No cervical or supraclavicular nodes.  "Shotty "bilateral axillary nodes GI: No hepatosplenomegaly, no mass, firm scar tissue at the upper aspect of the midline scar with associated tenderness Vascular: No leg edema   Lab Results  Component Value Date   WBC 3.9 (L) 04/12/2019   HGB 13.0 04/12/2019   HCT 38.2 (L) 04/12/2019   MCV 95.0 04/12/2019   PLT 136 (L) 04/12/2019   NEUTROABS 2.0 04/12/2019    CMP  Lab Results  Component Value Date   NA 142 07/09/2018   K 4.1 07/09/2018   CL 108 07/09/2018   CO2 28 07/09/2018   GLUCOSE 98 07/09/2018   BUN 11 07/09/2018   CREATININE 0.82 07/09/2018   CALCIUM 8.9 07/09/2018   PROT 6.1 07/09/2018   ALBUMIN 3.8 07/09/2018   AST 21 07/09/2018   ALT 9 07/09/2018   ALKPHOS 59 07/09/2018   BILITOT 0.5 07/09/2018   GFRNONAA 93 03/27/2017   GFRAA 107 03/27/2017    Medications: I have reviewed the patient's current medications.   Assessment/Plan: 1. Gastric cancer-adenocarcinoma of the gastric fundus/cardia ? Staging CT scans 06/07/2015 with no evidence of distant metastatic disease, tiny nonspecific pulmonary nodules and liver lesions-likely benign ? Subtotal gastrectomy and placement of a jejunostomy feeding tube 07/18/2015 for a T2,N0 tumor with negative surgical margins ? Initiation of adjuvant 5-FU/leucovorin 08/28/2015, status post 3  weekly treatments completed 09/13/2015 ? Initiation of radiation and concurrent Xeloda 10/29/2015 ? 11/26/2015 Xeloda dose reduced to 1000 mg twice daily due to progressive thrombocytopenia ? Radiation completed 12/06/2015 ? 5-FU/leucovorin resumed 12/21/2015   2. 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  Upper endoscopy 10/02/2016 with findings of multiple small bleeding angioectasiasstatus post argon plasma coagulation on 10/10/2016  3. Vitamin B-12 deficiency-Low-level confirmed 01/04/2016 and again 07/09/2018, maintained on oral vitamin B-12 replacement  4. 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 Anastomotic stricture-status post balloon dilatation 08/22/2015, status post electroincision dilation at [facility] 10/11/2015, repeat EGD/ dilatation by Dr. Carlean Purl 11/01/2015 and 12/10/2015; EGD 10/02/2016 with finding of esophageal stenosis at the gastroesophageal junction status post dilation 10/10/2016 and 01/21/2018  7. Mild neutropenia/thrombocytopenia secondary to chemotherapy. Progressive thrombocytopenia 11/26/2015. Xeloda dose reduced to 1000 mg twice daily. Counts stable to improved 01/04/2016.  Persistent mild thrombocytopenia  8.Erythema/tenderness at the jejunostomy feeding tube site 09/20/2015, improved  9. Feeding tube removed 01/04/2016  10. Melanoma left lower abdominal wall status post wide excision.   Disposition: James [name] is in clinical remission from gastric cancer.  He continues vitamin B12 replacement.  He will return for an office visit in 9 months.  He will follow-up with Dr. Quay Burow in the interim. He has chronic mild thrombocytopenia.  The  platelet count is at the low end of the normal range  today. Betsy Coder, MD  04/12/2019  10:06 AM

## 2019-05-12 ENCOUNTER — Ambulatory Visit (INDEPENDENT_AMBULATORY_CARE_PROVIDER_SITE_OTHER): Payer: Medicare Other

## 2019-05-12 DIAGNOSIS — E538 Deficiency of other specified B group vitamins: Secondary | ICD-10-CM | POA: Diagnosis not present

## 2019-05-12 MED ORDER — CYANOCOBALAMIN 1000 MCG/ML IJ SOLN
1000.0000 ug | Freq: Once | INTRAMUSCULAR | Status: AC
  Administered 2019-05-12: 1000 ug via INTRAMUSCULAR

## 2019-05-12 NOTE — Progress Notes (Signed)
b12 Injection given.   James Winget J Kagan Mutchler, MD  

## 2019-06-08 ENCOUNTER — Ambulatory Visit: Payer: Medicare Other | Admitting: Internal Medicine

## 2019-06-16 ENCOUNTER — Ambulatory Visit (INDEPENDENT_AMBULATORY_CARE_PROVIDER_SITE_OTHER): Payer: Medicare Other

## 2019-06-16 DIAGNOSIS — Z23 Encounter for immunization: Secondary | ICD-10-CM | POA: Diagnosis not present

## 2019-06-16 DIAGNOSIS — E538 Deficiency of other specified B group vitamins: Secondary | ICD-10-CM

## 2019-06-16 MED ORDER — CYANOCOBALAMIN 1000 MCG/ML IJ SOLN
1000.0000 ug | Freq: Once | INTRAMUSCULAR | Status: AC
  Administered 2019-06-16: 1000 ug via INTRAMUSCULAR

## 2019-06-16 NOTE — Progress Notes (Signed)
b12 Injection given.   James [name] James Marc Sivertsen, MD  

## 2019-06-16 NOTE — Progress Notes (Signed)
cyan 

## 2019-06-17 ENCOUNTER — Encounter: Payer: Self-pay | Admitting: Internal Medicine

## 2019-06-17 ENCOUNTER — Ambulatory Visit (INDEPENDENT_AMBULATORY_CARE_PROVIDER_SITE_OTHER): Payer: Medicare Other | Admitting: Internal Medicine

## 2019-06-17 VITALS — BP 100/66 | HR 56 | Temp 97.9°F | Ht 66.0 in | Wt 149.5 lb

## 2019-06-17 DIAGNOSIS — K929 Disease of digestive system, unspecified: Secondary | ICD-10-CM

## 2019-06-17 DIAGNOSIS — R1319 Other dysphagia: Secondary | ICD-10-CM

## 2019-06-17 DIAGNOSIS — K222 Esophageal obstruction: Secondary | ICD-10-CM

## 2019-06-17 DIAGNOSIS — R1310 Dysphagia, unspecified: Secondary | ICD-10-CM

## 2019-06-17 DIAGNOSIS — R1013 Epigastric pain: Secondary | ICD-10-CM | POA: Diagnosis not present

## 2019-06-17 DIAGNOSIS — K3189 Other diseases of stomach and duodenum: Secondary | ICD-10-CM

## 2019-06-17 NOTE — Assessment & Plan Note (Signed)
Needs dilation

## 2019-06-17 NOTE — Patient Instructions (Signed)
  You have been scheduled for an endoscopy. Please follow written instructions given to you at your visit today. If you use inhalers (even only as needed), please bring them with you on the day of your procedure.   I appreciate the opportunity to care for you. Carl Gessner, MD, FACG 

## 2019-06-17 NOTE — Progress Notes (Signed)
James [name] 73 y.o. Dec 10, 1945 [id number]  Assessment & Plan:   Encounter Diagnoses  Name Primary?  . Esophageal stricture Yes  . Anastomotic stricture of stomach   . Esophageal dysphagia   . Dyspepsia    Plan for repeat EGD and dilation.  One thing we could consider that might result in longer term patency would be temporary stenting.  We will consider that pending this EGD.  I suspect he is most likely just overeating causing this dyspepsia and fullness and pressure sensation.  He may need regular twice daily PPI.  The risks and benefits as well as alternatives of endoscopic procedure(s) have been reviewed. All questions answered. The patient agrees to proceed.  I appreciate the opportunity to care for this patient. CC: Binnie Rail, MD    Subjective:   Chief Complaint: Dysphagia dyspepsia  HPI Patient is here complaining of recurrent dysphagia, he has a long history of a an esophageal/gastric anastomotic stricture after curative resection of proximal gastric cancer in 2016.  He has required numerous stricture dilations though not since April 2019.  Over the past several months he has had recurrent dysphagia and he also has a postprandial fullness and discomfort.  Taking an extra Prilosec will make a difference in those cases of fullness and discomfort and some heartburn.  Weight is stable.  He remains active golfing 2-3 times a week and playing tennis 2 times a week.   Wt Readings from Last 3 Encounters:  06/17/19 149 lb 8 oz (67.8 kg)  04/12/19 150 lb 9.6 oz (68.3 kg)  08/20/18 151 lb 12.8 oz (68.9 kg)    Allergies  Allergen Reactions  . Codeine Nausea And Vomiting   Current Meds  Medication Sig  . acetaminophen (TYLENOL) 500 MG tablet Take 1,000 mg by mouth every 6 (six) hours as needed for mild pain.   . Multiple Vitamin (MULTIVITAMIN WITH MINERALS) TABS tablet Take 1 tablet by mouth every other day.   [name] omeprazole (PRILOSEC) 40 MG capsule take 1 capsule by  mouth once daily 30 MINUTES BEFORE BREAKFAST/SUPPER.  OKAY TO OPEN AND SPRINKLE ON APPLESAUCE  . pravastatin (PRAVACHOL) 40 MG tablet Take 1 tablet (40 mg total) by mouth at bedtime.  . vitamin B-12 (CYANOCOBALAMIN) 1000 MCG tablet 2,000 mcg daily.    Past Medical History:  Diagnosis Date  . Abnormal EKG    NS ST-T EKG Changes, (-) Nuclear Stress Test 03/2006  . Allergy   . Anemia    iron deficency  . Arthritis   . B12 deficiency 06/05/2015   B12 Low NL at 271 methtylmalonic acid high  . Cardiac murmur    nothing to be concerned with   . Cataract    extractions  . Duodenal ulcer 1972   transfusion required, 1 unit packed cells  . Dysphagia    history throat/stomach  cancer "can swallow some, uses more pureed type foods"  . Dyspnea    at times  . Esophageal reflux    no currlently  . 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 Gastrostomy tube in place [facility]) 2016   intermittent feeding  . Hx of basal cell carcinoma   . Hyperlipemia    no per pt 04-11-16  . Melanoma ([place])   . Prostate cancer ([place]) 09/06/14   last radiation 4'16   . S/P radiation therapy 11/29/2014 through 01/23/2015   Prostate 7800 cGy in 40 sessions, seminal vesicles 5600 cGy in 40 sessions   .  Skin cancer, basal cell   . Transfusion history    '72 -s/p surgery for duodenal ulcer, 12/17   Past Surgical History:  Procedure Laterality Date  . BALLOON DILATION N/A 09/12/2015   Procedure: BALLOON DILATION;  Surgeon: Gatha Mayer, MD;  Location: [facility];  Service: Endoscopy;  Laterality: N/A;  . BALLOON DILATION N/A 09/21/2015   Procedure: BALLOON DILATION;  Surgeon: Gatha Mayer, MD;  Location: [facility];  Service: Endoscopy;  Laterality: N/A;  . BALLOON DILATION N/A 10/04/2015   Procedure: BALLOON DILATION;  Surgeon: Milus Banister, MD;  Location: [facility];  Service: Endoscopy;  Laterality: N/A;  . BALLOON  DILATION N/A 12/10/2015   Procedure: BALLOON DILATION;  Surgeon: Gatha Mayer, MD;  Location: [facility];  Service: Endoscopy;  Laterality: N/A;  . BALLOON DILATION N/A 10/10/2016   Procedure: BALLOON DILATION;  Surgeon: Gatha Mayer, MD;  Location: [facility];  Service: Endoscopy;  Laterality: N/A;  . CATARACT EXTRACTION, BILATERAL Bilateral   . COLONOSCOPY    . colonoscopy with polypectomy  02/2012   Dr Sharlett Iles  . ESOPHAGOGASTRODUODENOSCOPY (EGD) WITH PROPOFOL N/A 08/17/2015   Procedure: ESOPHAGOGASTRODUODENOSCOPY (EGD) WITH PROPOFOL;  Surgeon: Gatha Mayer, MD;  Location: [facility];  Service: Endoscopy;  Laterality: N/A;  . ESOPHAGOGASTRODUODENOSCOPY (EGD) WITH PROPOFOL N/A 09/12/2015   Procedure: ESOPHAGOGASTRODUODENOSCOPY (EGD) WITH PROPOFOL;  Surgeon: Gatha Mayer, MD;  [facility];  Service: Endoscopy;  Laterality: N/A;  . ESOPHAGOGASTRODUODENOSCOPY (EGD) WITH PROPOFOL N/A 09/21/2015   Procedure: ESOPHAGOGASTRODUODENOSCOPY (EGD) WITH PROPOFOL;  Surgeon: Gatha Mayer, MD;  [facility];  Service: Endoscopy;  Laterality: N/A;  . ESOPHAGOGASTRODUODENOSCOPY (EGD) WITH PROPOFOL N/A 10/04/2015   Procedure: ESOPHAGOGASTRODUODENOSCOPY (EGD) WITH PROPOFOL;  Surgeon: Milus Banister, MD;  [facility]: [facility];  Service: Endoscopy;  Laterality: N/A;  possible stricture  . ESOPHAGOGASTRODUODENOSCOPY (EGD) WITH PROPOFOL N/A 12/10/2015   Procedure: ESOPHAGOGASTRODUODENOSCOPY (EGD) WITH PROPOFOL;  Surgeon: Gatha Mayer, MD;  [facility]: [facility];  Service: Endoscopy;  Laterality: N/A;  . ESOPHAGOGASTRODUODENOSCOPY (EGD) WITH PROPOFOL N/A 02/06/2016   Procedure: ESOPHAGOGASTRODUODENOSCOPY (EGD) WITH PROPOFOL;  Surgeon: Gatha Mayer, MD;  [facility];  Service: Endoscopy;  Laterality: N/A;  . ESOPHAGOGASTRODUODENOSCOPY (EGD) WITH PROPOFOL N/A 10/10/2016   Procedure: ESOPHAGOGASTRODUODENOSCOPY (EGD) WITH PROPOFOL;  Surgeon: Gatha Mayer, MD;  [facility];  Service: Endoscopy;  Laterality: N/A;  needs APC   . EUS N/A 06/14/2015   Procedure: ESOPHAGEAL ENDOSCOPIC ULTRASOUND (EUS) RADIAL;  Surgeon: Milus Banister, MD;  Location: [facility];  Service: Endoscopy;  Laterality: N/A;  . HOT HEMOSTASIS N/A 10/10/2016   Procedure: HOT HEMOSTASIS (ARGON PLASMA COAGULATION/BICAP);  Surgeon: Gatha Mayer, MD;  Location: [facility];  Service: Endoscopy;  Laterality: N/A;  . KNEE ARTHROSCOPY Right    Right Knee, [place] [facility]  . LAPAROSCOPIC GASTRECTOMY N/A 07/18/2015   Procedure: SUB TOTAL GASTRECTOMY;  Surgeon: Stark Klein, MD;  Location: [facility];  Service: General;  Laterality: N/A;  . LAPAROSCOPIC GASTROSTOMY N/A 07/18/2015   Procedure: FEEDING TUBE PLACEMENT;  Surgeon: Stark Klein, MD;  Location: [facility];  Service: General;  Laterality: N/A;  . LAPAROSCOPY N/A 07/18/2015   Procedure: LAPAROSCOPY DIAGNOSTIC;  Surgeon: Stark Klein, MD;  Location: [facility];  Service: General;  Laterality: N/A;  . MELANOMA EXCISION  02-01-16   abdominal wall  . PROSTATE BIOPSY  09/06/14  . ROTATOR CUFF REPAIR Right    R shoulder, [place]  . SAVORY DILATION N/A 02/06/2016   Procedure:  SAVORY DILATION;  Surgeon: Gatha Mayer, MD;  Location: [facility];  Service: Endoscopy;  Laterality: N/A;  . TOTAL SHOULDER ARTHROPLASTY Left 05/07/2017   Procedure: LEFT REVERSE TOTAL SHOULDER ARTHROPLASTY;  Surgeon: Justice Britain, MD;  Location: [facility];  Service: Orthopedics;  Laterality: Left;  . TRIGGER FINGER RELEASE Left 1998  . UPPER GASTROINTESTINAL ENDOSCOPY     x14  . 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   Review of Systems As above  Objective:   Physical Exam @BP  100/66 (BP Location: Left Arm, Patient Position: Sitting, Cuff Size: Normal)   Pulse (!) 56 Comment: irregular  Temp 97.9 F (36.6 C)   Ht 5' 6"  (1.676 m) Comment: height measured without shoes  Wt 149 lb 8 oz (67.8 kg)   BMI 24.13 kg/m @  General:  NAD Eyes:   anicteric Lungs:  clear Heart::  S1S2 no rubs, murmurs or gallops Abdomen:  soft and nontender, BS+ Ext:   no edema, cyanosis or clubbing    Data Reviewed:   As above

## 2019-06-17 NOTE — Assessment & Plan Note (Signed)
Symptomatic Will dilate again

## 2019-06-20 ENCOUNTER — Other Ambulatory Visit: Payer: Self-pay

## 2019-06-20 ENCOUNTER — Encounter: Payer: Self-pay | Admitting: Internal Medicine

## 2019-06-20 ENCOUNTER — Ambulatory Visit (AMBULATORY_SURGERY_CENTER): Payer: Medicare Other | Admitting: Internal Medicine

## 2019-06-20 VITALS — BP 128/78 | HR 57 | Temp 97.8°F | Resp 15 | Ht 66.0 in | Wt 149.0 lb

## 2019-06-20 DIAGNOSIS — E7800 Pure hypercholesterolemia, unspecified: Secondary | ICD-10-CM | POA: Diagnosis not present

## 2019-06-20 DIAGNOSIS — K222 Esophageal obstruction: Secondary | ICD-10-CM

## 2019-06-20 DIAGNOSIS — R1310 Dysphagia, unspecified: Secondary | ICD-10-CM | POA: Diagnosis not present

## 2019-06-20 MED ORDER — SODIUM CHLORIDE 0.9 % IV SOLN: 500.0000 mL | Freq: Once | INTRAVENOUS | Status: DC

## 2019-06-20 NOTE — Progress Notes (Signed)
Temp-June Vitals-Courtney  Pt's states no medical or surgical changes since previsit or office visit.  Patient is HOH. Did not bring his hearing aids

## 2019-06-20 NOTE — Patient Instructions (Addendum)
The opening was not closed down much - looked good overall. I did dilate to 20 mm.  I think it is important that you remember to cut small, chew well and eat slower and smaller portions.  If this dilation and these behavior modifications do not make a difference let me know.  No signs of cancer.  I appreciate the opportunity to care for you. Gatha Mayer, MD, [facility]  Please read handouts provided. Follow Dilation Diet. Continue present medications. Eat small portions, cut small and chew well.     YOU HAD AN ENDOSCOPIC PROCEDURE TODAY AT [facility]:   Refer to the procedure report that was given to you for any specific questions about what was found during the examination.  If the procedure report does not answer your questions, please call your gastroenterologist to clarify.  If you requested that your care partner not be given the details of your procedure findings, then the procedure report has been included in a sealed envelope for you to review at your convenience later.  YOU SHOULD EXPECT: Some feelings of bloating in the abdomen. Passage of more gas than usual.  Walking can help get rid of the air that was put into your GI tract during the procedure and reduce the bloating. If you had a lower endoscopy (such as a colonoscopy or flexible sigmoidoscopy) you may notice spotting of blood in your stool or on the toilet paper. If you underwent a bowel prep for your procedure, you may not have a normal bowel movement for a few days.  Please Note:  You might notice some irritation and congestion in your nose or some drainage.  This is from the oxygen used during your procedure.  There is no need for concern and it should clear up in a day or so.  SYMPTOMS TO REPORT IMMEDIATELY:    Following upper endoscopy (EGD)  Vomiting of blood or coffee ground material  New chest pain or pain under the shoulder blades  Painful or persistently difficult swallowing  New shortness  of breath  Fever of 100F or higher  Black, tarry-looking stools  For urgent or emergent issues, a gastroenterologist can be reached at any hour by calling [phone].   DIET:  Drink plenty of fluids but you should avoid alcoholic beverages for 24 hours.  ACTIVITY:  You should plan to take it easy for the rest of today and you should NOT DRIVE or use heavy machinery until tomorrow (because of the sedation medicines used during the test).    FOLLOW UP: Our staff will call the number listed on your records 48-72 hours following your procedure to check on you and address any questions or concerns that you may have regarding the information given to you following your procedure. If we do not reach you, we will leave a message.  We will attempt to reach you two times.  During this call, we will ask if you have developed any symptoms of COVID 19. If you develop any symptoms (ie: fever, flu-like symptoms, shortness of breath, cough etc.) before then, please call [phone].  If you test positive for Covid 19 in the 2 weeks post procedure, please call and report this information to [place].    If any biopsies were taken you will be contacted by phone or by letter within the next 1-3 weeks.  Please call us at [phone] if you have not heard about the biopsies in 3 weeks.    SIGNATURES/CONFIDENTIALITY: You  and/or your care partner have signed paperwork which will be entered into your electronic medical record.  These signatures attest to the fact that that the information above on your After Visit Summary has been reviewed and is understood.  Full responsibility of the confidentiality of this discharge information lies with you and/or your care-partner.

## 2019-06-20 NOTE — Op Note (Signed)
[facility] Patient Name: James [name] Procedure Date: 06/20/2019 2:27 PM MRN: [id number] Endoscopist: Gatha Mayer , MD Age: 73 Referring MD:  Date of Birth: 09-14-1946 Gender: Male Account #: [id number] Procedure:                Upper GI endoscopy Indications:              Dysphagia, Post-surgical anastomotic stenosis, For                            therapy of post-surgical anastomotic stenosis Medicines:                Propofol per Anesthesia, Monitored Anesthesia Care Procedure:                Pre-Anesthesia Assessment:                           - Prior to the procedure, a History and Physical                            was performed, and patient medications and                            allergies were reviewed. The patient's tolerance of                            previous anesthesia was also reviewed. The risks                            and benefits of the procedure and the sedation                            options and risks were discussed with the patient.                            All questions were answered, and informed consent                            was obtained. Prior Anticoagulants: The patient has                            taken no previous anticoagulant or antiplatelet                            agents. ASA Grade Assessment: II - A patient with                            mild systemic disease. After reviewing the risks                            and benefits, the patient was deemed in                            satisfactory condition to undergo the procedure.  After obtaining informed consent, the endoscope was                            passed under direct vision. Throughout the                            procedure, the patient's blood pressure, pulse, and                            oxygen saturations were monitored continuously. The                            Endoscope was introduced through the mouth, and             advanced to the second part of duodenum. The upper                            GI endoscopy was accomplished without difficulty.                            The patient tolerated the procedure well. Scope In: Scope Out: Findings:                 One benign-appearing, intrinsic moderate stenosis                            was found at the esophageal anastomosis. This                            stenosis measured 1.5 cm (inner diameter) x less                            than one cm (in length). The stenosis was                            traversed. A TTS dilator was passed through the                            scope. Dilation with an 18-19-20 mm balloon dilator                            was performed to 20 mm. The dilation site was                            examined and showed mild mucosal disruption.                            Estimated blood loss was minimal.                           Evidence of a proximal gastric resection were found                            in the cardia, in the gastric fundus and in the  gastric body.                           The examined duodenum was normal.                           Otherwiuse normal post-op exam on retroflexion. Complications:            No immediate complications. Estimated Blood Loss:     Estimated blood loss was minimal. Impression:               - Benign-appearing esophageal-gastric anastomosis                            stricture. Dilated.                           - A proximal gastric resection were found.                           - Normal examined duodenum.                           - No specimens collected. Recommendation:           - Patient has a contact number available for                            emergencies. The signs and symptoms of potential                            delayed complications were discussed with the                            patient. Return to normal activities tomorrow.                             Written discharge instructions were provided to the                            patient.                           - Clear liquids x 1 hour then soft foods rest of                            day. Start prior diet tomorrow.                           - Continue present medications.                           - EAT SMALL PORTIONS, CUT SMALL AND CHEW WELL -                            THIS BASELINE DIAMETER WAS AS GOOD AS EVER AND  DILATED AS MUCH AS EVER (20 MM) TODAY Gatha Mayer, MD 06/20/2019 2:47:00 PM This report has been signed electronically.

## 2019-06-20 NOTE — Progress Notes (Signed)
Called to room to assist during endoscopic procedure.  Patient ID and intended procedure confirmed with present staff. Received instructions for my participation in the procedure from the performing physician.  

## 2019-06-20 NOTE — Progress Notes (Signed)
Report given to PACU, vss 

## 2019-06-22 ENCOUNTER — Telehealth: Payer: Self-pay

## 2019-06-22 NOTE — Telephone Encounter (Signed)
  Follow up Call-  Call back number 06/20/2019 01/21/2018 08/04/2017 10/02/2016  Post procedure Call Back phone  # [phone]  Permission to leave phone message Yes No Yes Yes  Some recent data might be hidden     Patient questions:  Do you have a fever, pain , or abdominal swelling? No. Pain Score  0 *  Have you tolerated food without any problems? Yes.    Have you been able to return to your normal activities? Yes.    Do you have any questions about your discharge instructions: Diet   No. Medications  No. Follow up visit  No.  Do you have questions or concerns about your Care? No.  Actions: * If pain score is 4 or above: No action needed, pain <4.  1. Have you developed a fever since your procedure? no  2.   Have you had an respiratory symptoms (SOB or cough) since your procedure? no  3.   Have you tested positive for COVID 19 since your procedure no  4.   Have you had any family members/close contacts diagnosed with the COVID 19 since your procedure?  no   If yes to any of these questions please route to Joylene John, RN and Alphonsa Gin, Therapist, sports.

## 2019-07-12 NOTE — Progress Notes (Addendum)
Subjective:   James [name] is a 73 y.o. male who presents for Medicare Annual/Subsequent preventive examination. I connected with patient by a telephone and verified that I am speaking with the correct person using two identifiers. Patient stated full name and DOB. Patient gave permission to continue with telephonic visit. Patient's location was at home and Nurse's location was at [place] office.   Review of Systems:   Cardiac Risk Factors include: advanced age (>37mn, >>67women);dyslipidemia;male gender Sleep patterns: feels rested on waking, gets up 1 times nightly to void and sleeps 7-8 hours nightly.    Home Safety/Smoke Alarms: Feels safe in home. Smoke alarms in place.  Living environment; residence and Firearm Safety: 2-story house. Lives with wife, no needs for DME, good support system Seat Belt Safety/Bike Helmet: Wears seat belt.      Objective:    Vitals: There were no vitals taken for this visit.  There is no height or weight on file to calculate BMI.  Advanced Directives 07/13/2019 04/12/2019 06/29/2018 02/02/2018 01/21/2018 05/01/2017 03/27/2017  Does Patient Have a Medical Advance Directive? Yes Yes Yes Yes Yes Yes Yes  Type of AParamedicof ABraseltonLiving will HWhite CityLiving will [place]auderhillLiving will Living will;Healthcare Power of [place]Collingdalewill [place]Living will  Does patient want to make changes to medical advance directive? - No - Patient declined - - - No - Patient declined -  Copy of HCitrus Cityin Chart? No - copy requested No - copy requested No - copy requested No - copy requested - No - copy requested No - copy requested  Would patient like information on creating a medical advance directive? - - - - - No - Patient declined -    Tobacco Social History   Tobacco Use  Smoking Status Former Smoker  . Packs/day: 0.50  . Years: 5.00  . Pack years: 2.50   . Types: Cigarettes  . Quit date: 10/07/1967  . Years since quitting: 51.8  Smokeless Tobacco Never Used  Tobacco Comment   smoked 1[place], up to < 1 ppd     Counseling given: Not Answered Comment: smoked 1[place], up to < 1 ppd  Past Medical History:  Diagnosis Date  . Abnormal EKG    NS ST-T EKG Changes, (-) Nuclear Stress Test 03/2006  . Allergy   . Anemia    iron deficency  . Arthritis   . B12 deficiency 06/05/2015   B12 Low NL at 271 methtylmalonic acid high  . Cardiac murmur    nothing to be concerned with   . Cataract    extractions  . Duodenal ulcer 1972   transfusion required, 1 unit packed cells  . Dysphagia    history throat/stomach  cancer "can swallow some, uses more pureed type foods"  . Dyspnea    at times  . Esophageal reflux    no currlently  . [place]   invasive adenocarcinoma  . Gastrostomy tube in place ([facility]   intermittent feeding  . Hx of basal cell carcinoma   . Hyperlipemia    no per pt 04-11-16  . Melanoma ([place]   . [place] 09[place]06[place]   last radiation 4'16   . S/P radiation therapy 11/29/2014 through 01/23/2015   Prostate 7800 cGy in 40 sessions, seminal vesicles 5600 cGy in 40 sessions   . Skin cancer, basal cell   .  Transfusion history    '72 -s/p surgery for duodenal ulcer, 12/17   Past Surgical History:  Procedure Laterality Date  . BALLOON DILATION N/A 09/12/2015   Procedure: BALLOON DILATION;  Surgeon: Gatha Mayer, MD;  [facility];  Service: Endoscopy;  Laterality: N/A;  . BALLOON DILATION N/A 09/21/2015   Procedure: BALLOON DILATION;  Surgeon: Gatha Mayer, MD;  [facility];  Service: Endoscopy;  Laterality: N/A;  . BALLOON DILATION N/A 10/04/2015   Procedure: BALLOON DILATION;  Surgeon: Milus Banister, MD;  [facility];  Service: Endoscopy;  Laterality: N/A;  . BALLOON DILATION N/A  12/10/2015   Procedure: BALLOON DILATION;  Surgeon: Gatha Mayer, MD;  [facility];  Service: Endoscopy;  Laterality: N/A;  . BALLOON DILATION N/A 10/10/2016   Procedure: BALLOON DILATION;  Surgeon: Gatha Mayer, MD;  [facility];  Service: Endoscopy;  Laterality: N/A;  . CATARACT EXTRACTION, BILATERAL Bilateral   . COLONOSCOPY    . colonoscopy with polypectomy  02/2012   Dr Sharlett Iles  . ESOPHAGOGASTRODUODENOSCOPY (EGD) WITH PROPOFOL N/A 08/17/2015   Procedure: ESOPHAGOGASTRODUODENOSCOPY (EGD) WITH PROPOFOL;  Surgeon: Gatha Mayer, MD;  Location: [place];  Service: Endoscopy;  Laterality: N/A;  . ESOPHAGOGASTRODUODENOSCOPY (EGD) WITH PROPOFOL N/A 09/12/2015   Procedure: ESOPHAGOGASTRODUODENOSCOPY (EGD) WITH PROPOFOL;  Surgeon: Gatha Mayer, MD;  [facility];  Service: Endoscopy;  Laterality: N/A;  . ESOPHAGOGASTRODUODENOSCOPY (EGD) WITH PROPOFOL N/A 09/21/2015   Procedure: ESOPHAGOGASTRODUODENOSCOPY (EGD) WITH PROPOFOL;  Surgeon: Gatha Mayer, MD;  [facility];  Service: Endoscopy;  Laterality: N/A;  . ESOPHAGOGASTRODUODENOSCOPY (EGD) WITH PROPOFOL N/A 10/04/2015   Procedure: ESOPHAGOGASTRODUODENOSCOPY (EGD) WITH PROPOFOL;  Surgeon: Milus Banister, MD;  [facility];  Service: Endoscopy;  Laterality: N/A;  possible stricture  . ESOPHAGOGASTRODUODENOSCOPY (EGD) WITH PROPOFOL N/A 12/10/2015   Procedure: ESOPHAGOGASTRODUODENOSCOPY (EGD) WITH PROPOFOL;  Surgeon: Gatha Mayer, MD;  [facility];  Service: Endoscopy;  Laterality: N/A;  . ESOPHAGOGASTRODUODENOSCOPY (EGD) WITH PROPOFOL N/A 02/06/2016   Procedure: ESOPHAGOGASTRODUODENOSCOPY (EGD) WITH PROPOFOL;  Surgeon: Gatha Mayer, MD;  [facility];  Service: Endoscopy;  Laterality: N/A;  . ESOPHAGOGASTRODUODENOSCOPY (EGD) WITH PROPOFOL N/A 10/10/2016   Procedure: ESOPHAGOGASTRODUODENOSCOPY (EGD) WITH PROPOFOL;  Surgeon: Gatha Mayer, MD;  [facility];   Service: Endoscopy;  Laterality: N/A;  needs APC   . EUS N/A 06/14/2015   Procedure: ESOPHAGEAL ENDOSCOPIC ULTRASOUND (EUS) RADIAL;  Surgeon: Milus Banister, MD;  [facility];  Service: Endoscopy;  Laterality: N/A;  . HOT HEMOSTASIS N/A 10/10/2016   Procedure: HOT HEMOSTASIS (ARGON PLASMA COAGULATION/BICAP);  Surgeon: Gatha Mayer, MD;  [facility];  Service: Endoscopy;  Laterality: N/A;  . KNEE ARTHROSCOPY Right    Right Knee, [place] [facility]  . LAPAROSCOPIC GASTRECTOMY N/A 07/18/2015   Procedure: SUB TOTAL GASTRECTOMY;  Surgeon: Stark Klein, MD;  [facility];  Service: General;  Laterality: N/A;  . LAPAROSCOPIC GASTROSTOMY N/A 07/18/2015   Procedure: FEEDING TUBE PLACEMENT;  Surgeon: Stark Klein, MD;  [facility];  Service: General;  Laterality: N/A;  . LAPAROSCOPY N/A 07/18/2015   Procedure: LAPAROSCOPY DIAGNOSTIC;  Surgeon: Stark Klein, MD;  [facility];  Service: General;  Laterality: N/A;  . MELANOMA EXCISION  02-01-16   abdominal wall  . PROSTATE BIOPSY  09/06/14  . ROTATOR CUFF REPAIR Right    R shoulder, [place]  . SAVORY DILATION N/A 02/06/2016   Procedure: SAVORY DILATION;  Surgeon: Gatha Mayer,  MD;  [facility];  Service: Endoscopy;  Laterality: N/A;  . TOTAL SHOULDER ARTHROPLASTY Left 05/07/2017   Procedure: LEFT REVERSE TOTAL SHOULDER ARTHROPLASTY;  Surgeon: Justice Britain, MD;  Location: [place];  Service: Orthopedics;  Laterality: Left;  . TRIGGER FINGER RELEASE Left 1998  . UPPER GASTROINTESTINAL ENDOSCOPY     x14  . VASECTOMY     Family History  Problem Relation Age of Onset  . Diabetes Father   . Heart attack Father 29  . Liver disease Father        Related to Alcohol Use   . Alcohol abuse Mother   . Cirrhosis Mother        died in her 54s  . Multiple myeloma Brother   . Diabetes Sister   . Colon cancer Neg Hx   . Stomach cancer Neg Hx   . Stroke Neg Hx   . Esophageal cancer Neg Hx   . Rectal cancer Neg Hx     Social History   Socioeconomic History  . Marital status: Married    Spouse name: Not on file  . Number of children: 2  . Years of education: Not on file  . Highest education level: Not on file  Occupational History  . Occupation: retired  Scientific laboratory technician  . Financial resource strain: Not hard at all  . Food insecurity    Worry: Never true    Inability: Never true  . Transportation needs    Medical: No    Non-medical: No  Tobacco Use  . Smoking status: Former Smoker    Packs/day: 0.50    Years: 5.00    Pack years: 2.50    Types: Cigarettes    Quit date: 10/07/1967    Years since quitting: 51.8  . Smokeless tobacco: Never Used  . Tobacco comment: smoked [place] , up to < 1 ppd  Substance and Sexual Activity  . Alcohol use: Yes    Comment: maybe once a week  . Drug use: No  . Sexual activity: Yes  Lifestyle  . Physical activity    Days per week: 5 days    Minutes per session: 60 min  . Stress: Not at all  Relationships  . Social connections    Talks on phone: More than three times a week    Gets together: More than three times a week    Attends religious service: 1 to 4 times per year    Active member of club or organization: Yes    Attends meetings of clubs or organizations: More than 4 times per year    Relationship status: Married  Other Topics Concern  . Not on file  Social History Narrative   Married and retired - 1 son and 1 daughter    Lives at [place] and active golfer, tennis player   Very occasional alcohol former smoker    Outpatient Encounter Medications as of 07/13/2019  Medication Sig  . acetaminophen (TYLENOL) 500 MG tablet Take 1,000 mg by mouth every 6 (six) hours as needed for mild pain.   . Multiple Vitamin (MULTIVITAMIN WITH MINERALS) TABS tablet Take 1 tablet by mouth every other day.   [name] omeprazole (PRILOSEC) 40 MG capsule take 1 capsule by mouth once daily 30 MINUTES BEFORE BREAKFAST/SUPPER.  OKAY TO OPEN AND SPRINKLE ON APPLESAUCE  .  pravastatin (PRAVACHOL) 40 MG tablet Take 1 tablet (40 mg total) by mouth at bedtime.  . vitamin B-12 (CYANOCOBALAMIN) 1000 MCG tablet 2,000 mcg daily.  No facility-administered encounter medications on file as of 07/13/2019.     Activities of Daily Living In your present state of health, do you have any difficulty performing the following activities: 07/13/2019  Hearing? N  Vision? N  Difficulty concentrating or making decisions? N  Walking or climbing stairs? N  Dressing or bathing? N  Doing errands, shopping? N  Preparing Food and eating ? N  Using the Toilet? N  In the past six months, have you accidently leaked urine? N  Do you have problems with loss of bowel control? N  Managing your Medications? N  Managing your Finances? N  Housekeeping or managing your Housekeeping? N  Some recent data might be hidden    Patient Care Team: Binnie Rail, MD as PCP - General (Internal Medicine) Ladell Pier, MD as Consulting Physician (Oncology) Gatha Mayer, MD as Consulting Physician (Gastroenterology) Rutherford Guys, MD as Consulting Physician (Ophthalmology)   Assessment:   This is a routine wellness examination for James [name]. Physical assessment deferred to PCP.  Exercise Activities and Dietary recommendations Current Exercise Habits: Home exercise routine, Type of exercise: walking(tennis and golf), Time (Minutes): 60, Frequency (Times/Week): 7, Weekly Exercise (Minutes/Week): 420, Intensity: Mild  Diet (meal preparation, eat out, water intake, caffeinated beverages, dairy products, fruits and vegetables): in general, a "healthy" diet  , well balanced Due to gastric surgeries patient eats small amounts and at times has a poor appetite. Patient tries to eat 6 small meals daily.    Discussed supplementing with Ensure and other nutritional supplements. Encouraged patient to increase daily water and healthy fluid intake.  Goals    . Patient Stated     Continue to exercise to  build up my strength so I can continue to play golf, play tennis and keep up with my wife.       Fall Risk Fall Risk  07/13/2019 06/29/2018 05/01/2017 03/24/2017 01/18/2016  Falls in the past year? 0 No No No No  Comment - - Emmi Telephone Survey: data to providers prior to load - -  Number falls in past yr: 0 - - - -  Injury with Fall? 0 - - - -    Depression Screen PHQ 2/9 Scores 07/13/2019 06/29/2018 03/24/2017 03/24/2017  PHQ - 2 Score 0 0 0 0  PHQ- 9 Score - - 3 -    Cognitive Function       Ad8 score reviewed for issues:  Issues making decisions: no  Less interest in hobbies / activities: no  Repeats questions, stories (family complaining): no  Trouble using ordinary gadgets (microwave, computer, phone):no  Forgets the month or year: no  Mismanaging finances: no  Remembering appts: no  Daily problems with thinking and/or memory: no Ad8 score is= 0  Immunization History  Administered Date(s) Administered  . Fluad Quad(high Dose 65+) 06/16/2019  . Influenza Whole 08/07/2009  . Influenza, High Dose Seasonal PF 07/05/2015, 07/01/2017, 06/29/2018  . Influenza,inj,Quad PF,6+ Mos 09/20/2013  . Influenza-Unspecified 07/05/2016, 07/01/2017  . Pneumococcal Conjugate-13 07/05/2016  . Pneumococcal Polysaccharide-23 12/27/2010  . Td 12/21/2009  . Zoster 04/15/2010   Screening Tests Health Maintenance  Topic Date Due  . TETANUS/TDAP  12/22/2019  . COLONOSCOPY  08/04/2022  . INFLUENZA VACCINE  Completed  . Hepatitis C Screening  Completed  . PNA vac Low Risk Adult  Completed      Plan:  Reviewed health maintenance screenings with patient today and relevant education, vaccines, and/or referrals were provided.   Continue doing  brain stimulating activities (puzzles, reading, adult coloring books, staying active) to keep memory sharp.   I have personally reviewed and noted the following in the patient's chart:   . Medical and social history . Use of alcohol, tobacco  or illicit drugs  . Current medications and supplements . Functional ability and status . Nutritional status . Physical activity . Advanced directives . List of other physicians . Screenings to include cognitive, depression, and falls . Referrals and appointments  In addition, I have reviewed and discussed with patient certain preventive protocols, quality metrics, and best practice recommendations. A written personalized care plan for preventive services as well as general preventive health recommendations were provided to patient.     Michiel Cowboy, RN  07/13/2019   Medical screening examination/treatment/procedure(s) were performed by non-physician practitioner and as supervising physician I was immediately available for consultation/collaboration. I agree with above. Binnie Rail, MD

## 2019-07-13 ENCOUNTER — Ambulatory Visit (INDEPENDENT_AMBULATORY_CARE_PROVIDER_SITE_OTHER): Payer: Medicare Other | Admitting: *Deleted

## 2019-07-13 DIAGNOSIS — Z0000 Encounter for general adult medical examination without abnormal findings: Secondary | ICD-10-CM

## 2019-07-15 ENCOUNTER — Ambulatory Visit (INDEPENDENT_AMBULATORY_CARE_PROVIDER_SITE_OTHER): Payer: Medicare Other

## 2019-07-15 DIAGNOSIS — E538 Deficiency of other specified B group vitamins: Secondary | ICD-10-CM

## 2019-07-15 MED ORDER — CYANOCOBALAMIN 1000 MCG/ML IJ SOLN
1000.0000 ug | Freq: Once | INTRAMUSCULAR | Status: AC
  Administered 2019-07-15: 1000 ug via INTRAMUSCULAR

## 2019-07-15 NOTE — Progress Notes (Signed)
b12 Injection given.   James Hehl J Kendall Justo, MD  

## 2019-07-25 DIAGNOSIS — C61 Malignant neoplasm of prostate: Secondary | ICD-10-CM | POA: Diagnosis not present

## 2019-07-25 DIAGNOSIS — N5201 Erectile dysfunction due to arterial insufficiency: Secondary | ICD-10-CM | POA: Diagnosis not present

## 2019-08-12 ENCOUNTER — Other Ambulatory Visit: Payer: Self-pay

## 2019-08-12 ENCOUNTER — Ambulatory Visit (INDEPENDENT_AMBULATORY_CARE_PROVIDER_SITE_OTHER): Payer: Medicare Other

## 2019-08-12 DIAGNOSIS — E538 Deficiency of other specified B group vitamins: Secondary | ICD-10-CM | POA: Diagnosis not present

## 2019-08-12 MED ORDER — CYANOCOBALAMIN 1000 MCG/ML IJ SOLN
1000.0000 ug | Freq: Once | INTRAMUSCULAR | Status: AC
  Administered 2019-08-12: 1000 ug via INTRAMUSCULAR

## 2019-08-12 NOTE — Progress Notes (Signed)
b12 Injection given.   James Knecht J Samariyah Cowles, MD  

## 2019-09-12 ENCOUNTER — Other Ambulatory Visit: Payer: Self-pay

## 2019-09-12 ENCOUNTER — Ambulatory Visit (INDEPENDENT_AMBULATORY_CARE_PROVIDER_SITE_OTHER): Payer: Medicare Other

## 2019-09-12 DIAGNOSIS — E538 Deficiency of other specified B group vitamins: Secondary | ICD-10-CM | POA: Diagnosis not present

## 2019-09-12 MED ORDER — CYANOCOBALAMIN 1000 MCG/ML IJ SOLN
1000.0000 ug | Freq: Once | INTRAMUSCULAR | Status: AC
  Administered 2019-09-12: 16:00:00 1000 ug via INTRAMUSCULAR

## 2019-09-12 NOTE — Progress Notes (Signed)
b12 Injection given.   James [name] J [name], MD  

## 2019-10-09 ENCOUNTER — Other Ambulatory Visit: Payer: Self-pay | Admitting: Internal Medicine

## 2019-10-09 DIAGNOSIS — E782 Mixed hyperlipidemia: Secondary | ICD-10-CM

## 2019-10-12 ENCOUNTER — Ambulatory Visit (INDEPENDENT_AMBULATORY_CARE_PROVIDER_SITE_OTHER): Payer: Medicare Other

## 2019-10-12 ENCOUNTER — Other Ambulatory Visit: Payer: Self-pay

## 2019-10-12 DIAGNOSIS — E538 Deficiency of other specified B group vitamins: Secondary | ICD-10-CM

## 2019-10-12 MED ORDER — CYANOCOBALAMIN 1000 MCG/ML IJ SOLN
1000.0000 ug | Freq: Once | INTRAMUSCULAR | Status: AC
  Administered 2019-10-12: 16:00:00 1000 ug via INTRAMUSCULAR

## 2019-10-12 NOTE — Progress Notes (Signed)
b12 Injection given.   Quanda Pavlicek J Endy Easterly, MD  

## 2019-10-28 ENCOUNTER — Ambulatory Visit: Payer: Medicare Other | Attending: Internal Medicine

## 2019-10-28 DIAGNOSIS — Z23 Encounter for immunization: Secondary | ICD-10-CM | POA: Insufficient documentation

## 2019-10-28 NOTE — Progress Notes (Signed)
   Covid-19 Vaccination Clinic  Name:  James [name]    MRN: [id number] DOB: Jun 23, 1946  10/28/2019  [name] was observed post Covid-19 immunization for 15 minutes without incidence. He was provided with Vaccine Information Sheet and instruction to access the V-Safe system.   [name] was instructed to call 911 with any severe reactions post vaccine: [name] Difficulty breathing  . Swelling of your face and throat  . A fast heartbeat  . A bad rash all over your body  . Dizziness and weakness    Immunizations Administered    Name Date Dose VIS Date Route   Pfizer COVID-19 Vaccine 10/28/2019  8:26 AM 0.3 mL 09/16/2019 Intramuscular   Manufacturer: [place]   Lot: [id number]   [place]: [id number]

## 2019-11-10 ENCOUNTER — Other Ambulatory Visit: Payer: Self-pay

## 2019-11-10 ENCOUNTER — Ambulatory Visit (INDEPENDENT_AMBULATORY_CARE_PROVIDER_SITE_OTHER): Payer: Medicare Other | Admitting: *Deleted

## 2019-11-10 DIAGNOSIS — Z961 Presence of intraocular lens: Secondary | ICD-10-CM | POA: Diagnosis not present

## 2019-11-10 DIAGNOSIS — E538 Deficiency of other specified B group vitamins: Secondary | ICD-10-CM

## 2019-11-10 MED ORDER — CYANOCOBALAMIN 1000 MCG/ML IJ SOLN
1000.0000 ug | Freq: Once | INTRAMUSCULAR | Status: AC
  Administered 2019-11-10: 1000 ug via INTRAMUSCULAR

## 2019-11-10 NOTE — Progress Notes (Addendum)
Pls cosign for B12 since MD ir out of the office this afternoon.../lmb  Medical screening examination/treatment/procedure(s) were performed by non-physician practitioner and as supervising physician I was immediately available for consultation/collaboration. I agree with above. Cathlean Cower, MD

## 2019-11-16 ENCOUNTER — Other Ambulatory Visit: Payer: Self-pay

## 2019-11-16 DIAGNOSIS — E782 Mixed hyperlipidemia: Secondary | ICD-10-CM

## 2019-11-18 ENCOUNTER — Ambulatory Visit: Payer: Medicare Other | Attending: Internal Medicine

## 2019-11-18 DIAGNOSIS — Z23 Encounter for immunization: Secondary | ICD-10-CM | POA: Insufficient documentation

## 2019-11-18 NOTE — Progress Notes (Signed)
   Covid-19 Vaccination Clinic  Name:  James [name]    MRN: [id number] DOB: [birth year]  11/18/2019  [name] was observed post Covid-19 immunization for 15 minutes without incidence. He was provided with Vaccine Information Sheet and instruction to access the V-Safe system.   [name] was instructed to call 911 with any severe reactions post vaccine: [name] Difficulty breathing  . Swelling of your face and throat  . A fast heartbeat  . A bad rash all over your body  . Dizziness and weakness    Immunizations Administered    Name Date Dose VIS Date Route   Pfizer COVID-19 Vaccine 11/18/2019  8:15 AM 0.3 mL 09/16/2019 Intramuscular   Manufacturer: [place]   Lot: [id number]   [place]: [id number]

## 2019-11-24 ENCOUNTER — Ambulatory Visit: Payer: Medicare Other

## 2019-12-12 ENCOUNTER — Other Ambulatory Visit: Payer: Self-pay

## 2019-12-12 ENCOUNTER — Ambulatory Visit (INDEPENDENT_AMBULATORY_CARE_PROVIDER_SITE_OTHER): Payer: Medicare Other | Admitting: *Deleted

## 2019-12-12 DIAGNOSIS — E538 Deficiency of other specified B group vitamins: Secondary | ICD-10-CM

## 2019-12-12 MED ORDER — CYANOCOBALAMIN 1000 MCG/ML IJ SOLN
1000.0000 ug | Freq: Once | INTRAMUSCULAR | Status: AC
  Administered 2019-12-12: 16:00:00 1000 ug via INTRAMUSCULAR

## 2019-12-12 NOTE — Progress Notes (Addendum)
Pls cosign for B12 inj../lmb   b12 Injection given.   Stacy J Burns, MD  

## 2020-01-06 ENCOUNTER — Other Ambulatory Visit: Payer: Self-pay | Admitting: Internal Medicine

## 2020-01-06 DIAGNOSIS — E782 Mixed hyperlipidemia: Secondary | ICD-10-CM

## 2020-01-12 ENCOUNTER — Ambulatory Visit (INDEPENDENT_AMBULATORY_CARE_PROVIDER_SITE_OTHER): Payer: Medicare Other | Admitting: *Deleted

## 2020-01-12 ENCOUNTER — Inpatient Hospital Stay: Payer: Medicare Other | Attending: Oncology | Admitting: Oncology

## 2020-01-12 ENCOUNTER — Telehealth: Payer: Self-pay | Admitting: Oncology

## 2020-01-12 ENCOUNTER — Other Ambulatory Visit: Payer: Self-pay

## 2020-01-12 ENCOUNTER — Inpatient Hospital Stay: Payer: Medicare Other

## 2020-01-12 VITALS — BP 122/80 | HR 64 | Temp 98.0°F | Resp 18 | Ht 66.0 in | Wt 153.8 lb

## 2020-01-12 DIAGNOSIS — Z923 Personal history of irradiation: Secondary | ICD-10-CM | POA: Insufficient documentation

## 2020-01-12 DIAGNOSIS — Z8546 Personal history of malignant neoplasm of prostate: Secondary | ICD-10-CM | POA: Diagnosis not present

## 2020-01-12 DIAGNOSIS — T451X5A Adverse effect of antineoplastic and immunosuppressive drugs, initial encounter: Secondary | ICD-10-CM | POA: Insufficient documentation

## 2020-01-12 DIAGNOSIS — C4359 Malignant melanoma of other part of trunk: Secondary | ICD-10-CM | POA: Diagnosis not present

## 2020-01-12 DIAGNOSIS — E538 Deficiency of other specified B group vitamins: Secondary | ICD-10-CM | POA: Diagnosis not present

## 2020-01-12 DIAGNOSIS — C169 Malignant neoplasm of stomach, unspecified: Secondary | ICD-10-CM | POA: Diagnosis not present

## 2020-01-12 DIAGNOSIS — D6959 Other secondary thrombocytopenia: Secondary | ICD-10-CM | POA: Insufficient documentation

## 2020-01-12 DIAGNOSIS — C165 Malignant neoplasm of lesser curvature of stomach, unspecified: Secondary | ICD-10-CM | POA: Diagnosis not present

## 2020-01-12 DIAGNOSIS — R1310 Dysphagia, unspecified: Secondary | ICD-10-CM | POA: Insufficient documentation

## 2020-01-12 LAB — CBC WITH DIFFERENTIAL (CANCER CENTER ONLY)
Abs Immature Granulocytes: 0.02 10*3/uL (ref 0.00–0.07)
Basophils Absolute: 0 10*3/uL (ref 0.0–0.1)
Basophils Relative: 1 %
Eosinophils Absolute: 0.3 10*3/uL (ref 0.0–0.5)
Eosinophils Relative: 6 %
HCT: 40.4 % (ref 39.0–52.0)
Hemoglobin: 13.4 g/dL (ref 13.0–17.0)
Immature Granulocytes: 1 %
Lymphocytes Relative: 27 %
Lymphs Abs: 1 10*3/uL (ref 0.7–4.0)
MCH: 32.3 pg (ref 26.0–34.0)
MCHC: 33.2 g/dL (ref 30.0–36.0)
MCV: 97.3 fL (ref 80.0–100.0)
Monocytes Absolute: 0.5 10*3/uL (ref 0.1–1.0)
Monocytes Relative: 12 %
Neutro Abs: 2.1 10*3/uL (ref 1.7–7.7)
Neutrophils Relative %: 53 %
Platelet Count: 141 10*3/uL — ABNORMAL LOW (ref 150–400)
RBC: 4.15 MIL/uL — ABNORMAL LOW (ref 4.22–5.81)
RDW: 14.3 % (ref 11.5–15.5)
WBC Count: 3.9 10*3/uL — ABNORMAL LOW (ref 4.0–10.5)
nRBC: 0 % (ref 0.0–0.2)

## 2020-01-12 MED ORDER — CYANOCOBALAMIN 1000 MCG/ML IJ SOLN
1000.0000 ug | Freq: Once | INTRAMUSCULAR | Status: AC
  Administered 2020-01-12: 1000 ug via INTRAMUSCULAR

## 2020-01-12 NOTE — Telephone Encounter (Signed)
Scheduled appt per 4/8 los.  Patient declined calendar and avs.

## 2020-01-12 NOTE — Progress Notes (Signed)
Pls cosign for B12 inj../lmb  

## 2020-01-12 NOTE — Progress Notes (Signed)
[place] OFFICE PROGRESS NOTE   Diagnosis: Gastric cancer  INTERVAL HISTORY:   [name] returns as scheduled.  He feels well.  He plays golf and tennis.  Good appetite.  He has occasional dysphagia, but this is not a significant problem.  He last underwent an esophagus dilation on 06/20/2019.  He continues monthly vitamin B12 therapy.  He has received the COVID-19 vaccine.  He has noted an inflamed area at the site of a B12 injection at the left upper arm.  Objective:  Vital signs in last 24 hours:  Blood pressure 122/80, pulse 64, temperature 98 F (36.7 C), temperature source Temporal, resp. rate 18, height 5\' 6"  (1.676 m), weight 153 lb 12.8 oz (69.8 kg), SpO2 100 %.    Limited physical examination secondary to distancing with the Covid pandemic Lymphatics: No cervical, supraclavicular, or inguinal nodes.  "Shotty "bilateral axillary nodes  GI: No hepatosplenomegaly, no mass, nontender Vascular: No leg edema  Skin: Round 0.5 cm raised inflamed/scabbed lesion at the left upper arm,  Portacath/PICC-without erythema  Lab Results:  Lab Results  Component Value Date   WBC 3.9 (L) 01/12/2020   HGB 13.4 01/12/2020   HCT 40.4 01/12/2020   MCV 97.3 01/12/2020   PLT 141 (L) 01/12/2020   NEUTROABS 2.1 01/12/2020    CMP  Lab Results  Component Value Date   NA 142 07/09/2018   K 4.1 07/09/2018   CL 108 07/09/2018   CO2 28 07/09/2018   GLUCOSE 98 07/09/2018   BUN 11 07/09/2018   CREATININE 0.82 07/09/2018   CALCIUM 8.9 07/09/2018   PROT 6.1 07/09/2018   ALBUMIN 3.8 07/09/2018   AST 21 07/09/2018   ALT 9 07/09/2018   ALKPHOS 59 07/09/2018   BILITOT 0.5 07/09/2018   GFRNONAA 93 03/27/2017   GFRAA 107 03/27/2017     Medications: I have reviewed the patient's current medications.   Assessment/Plan: 1. Gastric cancer-adenocarcinoma of the gastric fundus/cardia ? Staging CT scans 06/07/2015 with no evidence of distant metastatic disease, tiny  nonspecific pulmonary nodules and liver lesions-likely benign ? Subtotal gastrectomy and placement of a jejunostomy feeding tube 07/18/2015 for a T2,N0 tumor with negative surgical margins ? Initiation of adjuvant 5-FU/leucovorin 08/28/2015, status post 3 weekly treatments completed 09/13/2015 ? Initiation of radiation and concurrent Xeloda 10/29/2015 ? 11/26/2015 Xeloda dose reduced to 1000 mg twice daily due to progressive thrombocytopenia ? Radiation completed 12/06/2015 ? 5-FU/leucovorin resumed 12/21/2015   2. 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  Upper endoscopy 10/02/2016 with findings of multiple small bleeding angioectasiasstatus post argon plasma coagulation on 10/10/2016  3. Vitamin B-12 deficiency-Low-level confirmed 01/04/2016 and again 07/09/2018, maintained on vitamin B12 injection therapy  4. 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 Anastomotic stricture-status post balloon dilatation 08/22/2015, status post [facility]incision dilation at [facility] 10/11/2015, repeat EGD/ dilatation by Dr. Carlean Purl 11/01/2015 and 12/10/2015; EGD 10/02/2016 with finding of esophageal stenosis at the gastroesophageal junction status post dilation 10/10/2016, 01/21/2018, 06/20/2019  7. Mild neutropenia/thrombocytopenia secondary to chemotherapy. Progressive thrombocytopenia 11/26/2015. Xeloda dose reduced to 1000 mg twice daily. Counts stable to improved 01/04/2016.  Persistent mild thrombocytopenia  8.Erythema/tenderness at the jejunostomy feeding tube site 09/20/2015, improved  9. Feeding tube removed 01/04/2016  10. Melanoma left lower abdominal wall status post wide excision.     Disposition:  James [name] is in  clinical remission from gastric cancer.  He will return for an office visit in 9 months.  He continues vitamin B12 therapy.  He will follow-up with his dermatologist at the skin lesion at the left upper arm does not improve.    Betsy Coder, MD  01/12/2020  11:16 AM

## 2020-01-20 ENCOUNTER — Encounter: Payer: Self-pay | Admitting: Internal Medicine

## 2020-01-20 DIAGNOSIS — E782 Mixed hyperlipidemia: Secondary | ICD-10-CM

## 2020-01-20 MED ORDER — PRAVASTATIN SODIUM 40 MG PO TABS: 40.0000 mg | ORAL_TABLET | Freq: Every day | ORAL | 0 refills | Status: DC

## 2020-02-01 ENCOUNTER — Encounter: Payer: Self-pay | Admitting: Internal Medicine

## 2020-02-01 NOTE — Progress Notes (Signed)
Subjective:    Patient ID: James [name], male    DOB: 1946-10-06, 74 y.o.   MRN: [id number]  HPI The patient is here for follow up of their chronic medical problems, including GERD, h/o gastric cancer, hyperlipidemia, B12 def  He plays golf once a week and tennis twice a week.  He does walk.  He does yard work.    Occasional lightheadedness /dizziness when he stands.  Work up at [employer] a couple of years ago was negative.    He gets a B12 injection monthly.    Overall, he feels good and has no concerns.   Medications and allergies reviewed with patient and updated if appropriate.  Patient Active Problem List   Diagnosis Date Noted  . Left breast lump 07/15/2018  . Hyperglycemia 07/07/2018  . Nonintractable headache 07/08/2017  . 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 Gastric AVM   . Thrombocytopenia ([place]) 05/24/2016  . Malignant melanoma ([place]) 01/08/2016  . Anastomotic stricture of stomach   . Esophageal stricture   . 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 Gastric cancer ([place]) 06/08/2015  . B12 deficiency 06/05/2015  . Prostate cancer ([place]) 10/24/2014  . Vitamin D deficiency 06/03/2014  . History of transfusion 12/16/2013  . Basal cell cancer 09/17/2012  . HEARING DEFICIT 12/21/2009  . NONSPECIFIC ABNORMAL ELECTROCARDIOGRAM 12/21/2009  . History of colonic polyps 12/04/2008  . HYPERLIPIDEMIA 08/23/2007    Current Outpatient Medications on File Prior to Visit  Medication Sig Dispense Refill  . acetaminophen (TYLENOL) 500 MG tablet Take 1,000 mg by mouth every 6 (six) hours as needed for mild pain.     . Multiple Vitamin (MULTIVITAMIN WITH MINERALS) TABS tablet Take 1 tablet by mouth every other day.     [name] omeprazole (PRILOSEC) 40 MG capsule take 1 capsule by mouth once daily 30 MINUTES BEFORE BREAKFAST/SUPPER.  OKAY TO OPEN AND SPRINKLE ON APPLESAUCE 180 capsule 3  . vitamin B-12 (CYANOCOBALAMIN) 1000 MCG tablet 2,000 mcg daily.       No current facility-administered medications on file prior to visit.    Past Medical History:  Diagnosis Date  . Abnormal EKG    NS ST-T EKG Changes, (-) Nuclear Stress Test 03/2006  . Allergy   . Anemia    iron deficency  . Arthritis   . B12 deficiency 06/05/2015   B12 Low NL at 271 methtylmalonic acid high  . Cardiac murmur    nothing to be concerned with   . Cataract    extractions  . Duodenal ulcer 1972   transfusion required, 1 unit packed cells  . Dysphagia    history throat/stomach  cancer "can swallow some, uses more pureed type foods"  . Dyspnea    at times  . Esophageal reflux    no currlently  . Gastric cancer ([place]) 07/18/15   invasive adenocarcinoma  . Gastrostomy tube in place [facility]) 2016   intermittent feeding  . Hx of basal cell carcinoma   . Hyperlipemia    no per pt 04-11-16  . Melanoma ([place])   . Prostate cancer ([place]) 09/06/14   last radiation 4'16   . S/P radiation therapy 11/29/2014 through 01/23/2015   Prostate 7800 cGy in 40 sessions, seminal vesicles 5600 cGy in 40 sessions   . Skin cancer, basal cell   . Transfusion history    '72 -s/p surgery for duodenal ulcer, 12/17    Past Surgical History:  Procedure Laterality Date  .  BALLOON DILATION N/A 09/12/2015   Procedure: BALLOON DILATION;  Surgeon: Gatha Mayer, MD;  [facility];  Service: Endoscopy;  Laterality: N/A;  . BALLOON DILATION N/A 09/21/2015   Procedure: BALLOON DILATION;  Surgeon: Gatha Mayer, MD;  [facility];  Service: Endoscopy;  Laterality: N/A;  . BALLOON DILATION N/A 10/04/2015   Procedure: BALLOON DILATION;  Surgeon: Milus Banister, MD;  [facility];  Service: Endoscopy;  Laterality: N/A;  . BALLOON DILATION N/A 12/10/2015   Procedure: BALLOON DILATION;  Surgeon: Gatha Mayer, MD;  [facility];  Service: Endoscopy;  Laterality: N/A;  . BALLOON DILATION N/A  10/10/2016   Procedure: BALLOON DILATION;  Surgeon: Gatha Mayer, MD;  [facility];  Service: Endoscopy;  Laterality: N/A;  . CATARACT EXTRACTION, BILATERAL Bilateral   . COLONOSCOPY    . colonoscopy with polypectomy  02/2012   Dr Sharlett Iles  . ESOPHAGOGASTRODUODENOSCOPY (EGD) WITH PROPOFOL N/A 08/17/2015   Procedure: ESOPHAGOGASTRODUODENOSCOPY (EGD) WITH PROPOFOL;  Surgeon: Gatha Mayer, MD;  Location: [facility];  Service: Endoscopy;  Laterality: N/A;  . ESOPHAGOGASTRODUODENOSCOPY (EGD) WITH PROPOFOL N/A 09/12/2015   Procedure: ESOPHAGOGASTRODUODENOSCOPY (EGD) WITH PROPOFOL;  Surgeon: Gatha Mayer, MD;  [facility];  Service: Endoscopy;  Laterality: N/A;  . ESOPHAGOGASTRODUODENOSCOPY (EGD) WITH PROPOFOL N/A 09/21/2015   Procedure: ESOPHAGOGASTRODUODENOSCOPY (EGD) WITH PROPOFOL;  Surgeon: Gatha Mayer, MD;  [facility];  Service: Endoscopy;  Laterality: N/A;  . ESOPHAGOGASTRODUODENOSCOPY (EGD) WITH PROPOFOL N/A 10/04/2015   Procedure: ESOPHAGOGASTRODUODENOSCOPY (EGD) WITH PROPOFOL;  Surgeon: Milus Banister, MD;  [facility];  Service: Endoscopy;  Laterality: N/A;  possible stricture  . ESOPHAGOGASTRODUODENOSCOPY (EGD) WITH PROPOFOL N/A 12/10/2015   Procedure: ESOPHAGOGASTRODUODENOSCOPY (EGD) WITH PROPOFOL;  Surgeon: Gatha Mayer, MD;  [facility];  Service: Endoscopy;  Laterality: N/A;  . ESOPHAGOGASTRODUODENOSCOPY (EGD) WITH PROPOFOL N/A 02/06/2016   Procedure: ESOPHAGOGASTRODUODENOSCOPY (EGD) WITH PROPOFOL;  Surgeon: Gatha Mayer, MD;  [facility];  Service: Endoscopy;  Laterality: N/A;  . ESOPHAGOGASTRODUODENOSCOPY (EGD) WITH PROPOFOL N/A 10/10/2016   Procedure: ESOPHAGOGASTRODUODENOSCOPY (EGD) WITH PROPOFOL;  Surgeon: Gatha Mayer, MD;  [facility];  Service: Endoscopy;  Laterality: N/A;  needs APC   . EUS N/A 06/14/2015   Procedure: ESOPHAGEAL ENDOSCOPIC ULTRASOUND (EUS) RADIAL;  Surgeon: Milus Banister,  MD;  [facility];  Service: Endoscopy;  Laterality: N/A;  . HOT HEMOSTASIS N/A 10/10/2016   Procedure: HOT HEMOSTASIS (ARGON PLASMA COAGULATION/BICAP);  Surgeon: Gatha Mayer, MD;  [facility];  Service: Endoscopy;  Laterality: N/A;  . KNEE ARTHROSCOPY Right    Right Knee, [place] [facility]  . LAPAROSCOPIC GASTRECTOMY N/A 07/18/2015   Procedure: SUB TOTAL GASTRECTOMY;  Surgeon: Stark Klein, MD;  [facility];  Service: General;  Laterality: N/A;  . LAPAROSCOPIC GASTROSTOMY N/A 07/18/2015   Procedure: FEEDING TUBE PLACEMENT;  Surgeon: Stark Klein, MD;  [facility];  Service: General;  Laterality: N/A;  . LAPAROSCOPY N/A 07/18/2015   Procedure: LAPAROSCOPY DIAGNOSTIC;  Surgeon: Stark Klein, MD;  [facility];  Service: General;  Laterality: N/A;  . MELANOMA EXCISION  02-01-16   abdominal wall  . PROSTATE BIOPSY  09/06/14  . ROTATOR CUFF REPAIR Right    R shoulder, [place]  . SAVORY DILATION N/A 02/06/2016   Procedure: SAVORY DILATION;  Surgeon: Gatha Mayer, MD;  [facility];  Service: Endoscopy;  Laterality: N/A;  . TOTAL SHOULDER ARTHROPLASTY Left 05/07/2017   Procedure: LEFT REVERSE  TOTAL SHOULDER ARTHROPLASTY;  Surgeon: Justice Britain, MD;  Location: [facility];  Service: Orthopedics;  Laterality: Left;  . TRIGGER FINGER RELEASE Left 1998  . UPPER GASTROINTESTINAL ENDOSCOPY     x14  . VASECTOMY      Social History   Socioeconomic History  . Marital status: Married    Spouse name: Not on file  . Number of children: 2  . Years of education: Not on file  . Highest education level: Not on file  Occupational History  . Occupation: retired  Tobacco Use  . Smoking status: Former Smoker    Packs/day: 0.50    Years: 5.00    Pack years: 2.50    Types: Cigarettes    Quit date: 10/07/1967    Years since quitting: 52.3  . Smokeless tobacco: Never Used  . Tobacco comment: smoked [place] , up to < 1 ppd  Substance and Sexual Activity  . Alcohol use:  Yes    Comment: maybe once a week  . Drug use: No  . Sexual activity: Yes  Other Topics Concern  . 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 Difficulty of Paying Living Expenses:   Food Insecurity:   . Worried About Charity fundraiser in the Last Year:   . Arboriculturist in the Last Year:   Transportation Needs:   . Film/video editor (Medical):   [name] Lack of Transportation (Non-Medical):   Physical Activity:   . Days of Exercise per Week:   . Minutes of Exercise per Session:   Stress:   . Feeling of Stress :   Social Connections:   . Frequency of Communication with Friends and Family:   . Frequency of Social Gatherings with Friends and Family:   . Attends Religious Services:   . Active Member of Clubs or Organizations:   . Attends Archivist Meetings:   [name] Marital Status:     Family History  Problem Relation Age of Onset  . Diabetes Father   . Heart attack Father 3  . Liver disease Father        Related to Alcohol Use   . Alcohol abuse Mother   . Cirrhosis Mother        died in her 22s  . Multiple myeloma Brother   . Diabetes Sister   . Colon cancer Neg Hx   . Stomach cancer Neg Hx   . Stroke Neg Hx   . Esophageal cancer Neg Hx   . Rectal cancer Neg Hx     Review of Systems  Constitutional: Negative for chills, fever and unexpected weight change.  Respiratory: Negative for cough, shortness of breath and wheezing.   Cardiovascular: Negative for chest pain, palpitations and leg swelling.  Gastrointestinal: Positive for nausea (occ if he eats too much and too fast).  Neurological: Positive for light-headedness, numbness (occ tingling in fingers and toes) and headaches (occ, mild).  Psychiatric/Behavioral: Negative for dysphoric mood and sleep  disturbance. The patient is not nervous/anxious.        Objective:   Vitals:   02/02/20 0908  BP: 138/82  Pulse: 60  Resp: 16  Temp: 98.3 F (36.8 C)  SpO2: 99%   BP Readings from Last 3  Encounters:  02/02/20 138/82  01/12/20 122/80  06/20/19 128/78   Wt Readings from Last 3 Encounters:  02/02/20 152 lb 3.2 oz (69 kg)  01/12/20 153 lb 12.8 oz (69.8 kg)  06/20/19 149 lb (67.6 kg)   Body mass index is 24.57 kg/m.   Physical Exam    Constitutional: Appears well-developed and well-nourished. No distress.  HENT:  Head: Normocephalic and atraumatic.  Neck: Neck supple. No tracheal deviation present. No thyromegaly present.  No cervical lymphadenopathy Cardiovascular: Normal rate, regular rhythm and normal heart sounds.   No murmur heard. No carotid bruit .  No edema Pulmonary/Chest: Effort normal and breath sounds normal. No respiratory distress. No has no wheezes. No rales.  Abdomen: soft, NT, ND Skin: Skin is warm and dry. Not diaphoretic.  Psychiatric: Normal mood and affect. Behavior is normal.      Assessment & Plan:    See Problem List for Assessment and Plan of chronic medical problems.    This visit occurred during the SARS-CoV-2 public health emergency.  Safety protocols were in place, including screening questions prior to the visit, additional usage of staff PPE, and extensive cleaning of exam room while observing appropriate contact time as indicated for disinfecting solutions.

## 2020-02-01 NOTE — Patient Instructions (Addendum)
  Blood work was ordered.     Medications reviewed and updated.  Changes include :   none  Your prescription(s) have been submitted to your pharmacy. Please take as directed and contact our office if you believe you are having problem(s) with the medication(s).     Please followup in 1 year  

## 2020-02-02 ENCOUNTER — Other Ambulatory Visit: Payer: Self-pay

## 2020-02-02 ENCOUNTER — Ambulatory Visit (INDEPENDENT_AMBULATORY_CARE_PROVIDER_SITE_OTHER): Payer: Medicare Other | Admitting: Internal Medicine

## 2020-02-02 ENCOUNTER — Encounter: Payer: Self-pay | Admitting: Internal Medicine

## 2020-02-02 VITALS — BP 138/82 | HR 60 | Temp 98.3°F | Resp 16 | Ht 66.0 in | Wt 152.2 lb

## 2020-02-02 DIAGNOSIS — E538 Deficiency of other specified B group vitamins: Secondary | ICD-10-CM

## 2020-02-02 DIAGNOSIS — E782 Mixed hyperlipidemia: Secondary | ICD-10-CM | POA: Diagnosis not present

## 2020-02-02 DIAGNOSIS — K219 Gastro-esophageal reflux disease without esophagitis: Secondary | ICD-10-CM | POA: Diagnosis not present

## 2020-02-02 DIAGNOSIS — E559 Vitamin D deficiency, unspecified: Secondary | ICD-10-CM | POA: Diagnosis not present

## 2020-02-02 DIAGNOSIS — R739 Hyperglycemia, unspecified: Secondary | ICD-10-CM | POA: Diagnosis not present

## 2020-02-02 LAB — COMPREHENSIVE METABOLIC PANEL
ALT: 10 U/L (ref 0–53)
AST: 25 U/L (ref 0–37)
Albumin: 4.1 g/dL (ref 3.5–5.2)
Alkaline Phosphatase: 58 U/L (ref 39–117)
BUN: 15 mg/dL (ref 6–23)
CO2: 31 mEq/L (ref 19–32)
Calcium: 9 mg/dL (ref 8.4–10.5)
Chloride: 105 mEq/L (ref 96–112)
Creatinine, Ser: 0.78 mg/dL (ref 0.40–1.50)
GFR: 96.99 mL/min (ref >60.00)
Glucose, Bld: 75 mg/dL (ref 70–99)
Potassium: 4 mEq/L (ref 3.5–5.1)
Sodium: 140 mEq/L (ref 135–145)
Total Bilirubin: 0.6 mg/dL (ref 0.2–1.2)
Total Protein: 6.4 g/dL (ref 6.0–8.3)

## 2020-02-02 LAB — VITAMIN D 25 HYDROXY (VIT D DEFICIENCY, FRACTURES): VITD: 24.63 ng/mL — ABNORMAL LOW (ref 30.00–100.00)

## 2020-02-02 LAB — LIPID PANEL
Cholesterol: 141 mg/dL (ref 0–200)
HDL: 60.2 mg/dL (ref >39.00)
LDL Cholesterol: 71 mg/dL (ref 0–99)
NonHDL: 80.87
Total CHOL/HDL Ratio: 2
Triglycerides: 48 mg/dL (ref 0.0–149.0)
VLDL: 9.6 mg/dL (ref 0.0–40.0)

## 2020-02-02 LAB — HEMOGLOBIN A1C: Hgb A1c MFr Bld: 5.7 % (ref 4.6–6.5)

## 2020-02-02 LAB — VITAMIN B12: Vitamin B-12: 958 pg/mL — ABNORMAL HIGH (ref 211–911)

## 2020-02-02 MED ORDER — PRAVASTATIN SODIUM 40 MG PO TABS: 40.0000 mg | ORAL_TABLET | Freq: Every day | ORAL | 3 refills | Status: DC

## 2020-02-02 NOTE — Assessment & Plan Note (Signed)
Chronic Check lipid pane, cmp  Continue daily statin Regular exercise and healthy diet encouraged

## 2020-02-02 NOTE — Assessment & Plan Note (Signed)
H/o esophageal stricture, gastric AVM and gastric cancer s/p resection Continue omeprazole daily No GERD symptoms, no dysphagia Following with GI/Oncology

## 2020-02-02 NOTE — Assessment & Plan Note (Signed)
Chronic Last 5.7%  Check a1c

## 2020-02-02 NOTE — Assessment & Plan Note (Signed)
Chronic Getting B12 injection monthly and taking oral B12 S/p 2/3 gastric resection due to cancer Last injection 4/8 Check B12 level

## 2020-02-02 NOTE — Assessment & Plan Note (Signed)
Chronic Taking a MVI Check level

## 2020-02-03 ENCOUNTER — Encounter: Payer: Self-pay | Admitting: Internal Medicine

## 2020-02-15 ENCOUNTER — Other Ambulatory Visit: Payer: Self-pay

## 2020-02-15 ENCOUNTER — Ambulatory Visit (INDEPENDENT_AMBULATORY_CARE_PROVIDER_SITE_OTHER): Payer: Medicare Other | Admitting: *Deleted

## 2020-02-15 DIAGNOSIS — E538 Deficiency of other specified B group vitamins: Secondary | ICD-10-CM

## 2020-02-15 MED ORDER — CYANOCOBALAMIN 1000 MCG/ML IJ SOLN
1000.0000 ug | Freq: Once | INTRAMUSCULAR | Status: AC
  Administered 2020-02-15: 1000 ug via INTRAMUSCULAR

## 2020-02-15 NOTE — Progress Notes (Signed)
Pls cosign for B12 inj../lmb  

## 2020-03-15 ENCOUNTER — Other Ambulatory Visit: Payer: Self-pay

## 2020-03-15 ENCOUNTER — Ambulatory Visit (INDEPENDENT_AMBULATORY_CARE_PROVIDER_SITE_OTHER): Payer: Medicare Other | Admitting: *Deleted

## 2020-03-15 DIAGNOSIS — E538 Deficiency of other specified B group vitamins: Secondary | ICD-10-CM | POA: Diagnosis not present

## 2020-03-15 MED ORDER — CYANOCOBALAMIN 1000 MCG/ML IJ SOLN
1000.0000 ug | Freq: Once | INTRAMUSCULAR | Status: AC
  Administered 2020-03-15: 1000 ug via INTRAMUSCULAR

## 2020-03-15 NOTE — Progress Notes (Signed)
Pls cosign for B12 inj../lmb  

## 2020-04-04 ENCOUNTER — Other Ambulatory Visit: Payer: Self-pay | Admitting: Internal Medicine

## 2020-04-18 ENCOUNTER — Other Ambulatory Visit: Payer: Self-pay

## 2020-04-18 ENCOUNTER — Ambulatory Visit (INDEPENDENT_AMBULATORY_CARE_PROVIDER_SITE_OTHER): Payer: Medicare Other | Admitting: Internal Medicine

## 2020-04-18 ENCOUNTER — Ambulatory Visit: Payer: Medicare Other

## 2020-04-18 ENCOUNTER — Encounter: Payer: Self-pay | Admitting: Internal Medicine

## 2020-04-18 VITALS — BP 138/78 | HR 63 | Temp 98.2°F | Resp 16 | Ht 66.0 in | Wt 150.0 lb

## 2020-04-18 DIAGNOSIS — D696 Thrombocytopenia, unspecified: Secondary | ICD-10-CM

## 2020-04-18 DIAGNOSIS — G3281 Cerebellar ataxia in diseases classified elsewhere: Secondary | ICD-10-CM | POA: Diagnosis not present

## 2020-04-18 DIAGNOSIS — R0989 Other specified symptoms and signs involving the circulatory and respiratory systems: Secondary | ICD-10-CM | POA: Diagnosis not present

## 2020-04-18 DIAGNOSIS — R5383 Other fatigue: Secondary | ICD-10-CM | POA: Insufficient documentation

## 2020-04-18 DIAGNOSIS — Z23 Encounter for immunization: Secondary | ICD-10-CM

## 2020-04-18 DIAGNOSIS — R42 Dizziness and giddiness: Secondary | ICD-10-CM

## 2020-04-18 DIAGNOSIS — E538 Deficiency of other specified B group vitamins: Secondary | ICD-10-CM | POA: Diagnosis not present

## 2020-04-18 DIAGNOSIS — R270 Ataxia, unspecified: Secondary | ICD-10-CM | POA: Diagnosis not present

## 2020-04-18 DIAGNOSIS — R5382 Chronic fatigue, unspecified: Secondary | ICD-10-CM

## 2020-04-18 MED ORDER — CYANOCOBALAMIN 1000 MCG/ML IJ SOLN
1000.0000 ug | INTRAMUSCULAR | Status: DC
  Administered 2020-04-18 – 2021-08-12 (×10): 1000 ug via INTRAMUSCULAR

## 2020-04-18 NOTE — Patient Instructions (Signed)
Ataxia  Ataxia is a condition that causes unsteadiness when walking and standing, poor coordination of body movements, and difficulty keeping a straight (upright) posture. It occurs because of a problem with the part of the brain that controls coordination and stability (cerebellum). Ataxia can develop later in life (acquired ataxia), during your 20s or 30s or even into your 60s or later. This type of ataxia develops when another medical condition, such as a stroke, damages the cerebellum. Ataxia also may be present early in life (non-acquired ataxia). There are two main types of non-acquired ataxia:  Congenital. This type is present at birth.  Hereditary. This type is passed from parent to child. The most common form of hereditary non-acquired ataxia is Friedreich ataxia. What are the causes? Acquired ataxia may be caused by:  Changes in the nervous system (neurodegenerative changes).  Changes throughout the body (systemic disorders).  A lot of exposure to: ? Certain medicines such as phenytoin and lithium. ? Solvents. These are cleaning fluids such as paint thinner, nail polish remover, carpet cleaner, and degreasers.  Alcohol abuse (alcoholism).  Medical conditions, such as: ? Celiac disease. ? Hypothyroidism. ? A lack (deficiency) of vitamin E, vitamin B12, or thiamine. ? Brain tumors. ? Multiple sclerosis. ? Cerebral palsy. ? Stroke. ? Paraneoplastic syndromes. ? Viral infections. ? Head injury. ? Malnutrition. Congenital and hereditary ataxia are caused by problems that are present in genes before birth. What are the signs or symptoms? Signs and symptoms of ataxia vary depending on the cause. They may include:  Being unsteady.  Walking with the legs wide apart (wide stance) to keep one's balance.  Uncontrolled shaking (tremor).  Poorly coordinated body movements.  Difficulty maintaining an upright posture.  Fatigue.  Changes in speech.  Changes in  vision.  Involuntary eye movements (nystagmus).  Difficulty swallowing.  Difficulty writing.  Muscle tightening that you cannot control (muscle spasms). How is this diagnosed? Ataxia may be diagnosed based on:  Your personal and family medical history.  A physical exam.  Imaging tests, such as a CT scan or MRI.  Spinal tap (lumbar puncture). This procedure involves using a needle to take a sample of the fluid around your brain and spinal cord.  Genetic testing. How is this treated? The underlying condition that causes your ataxia needs to be treated. If the cause is a brain tumor, you may need surgery. Treatment also focuses on helping you live with ataxia and improving your quality of life (supportive treatments). This may involve:  Learning ways to improve coordination and move around more carefully (physical therapy).  Learning ways to improve your ability to do daily tasks, such as bathing and feeding yourself (occupational therapy).  Using devices to help you move around, eat, or communicate (assistive devices), such as a walker, modified eating utensils, and communication aids.  Learning ways to improve speech and swallowing (speech therapy). Follow these instructions at home: Preventing falls  Lie down right away if you become very unsteady, dizzy, or nauseous, or if you feel like you are going to faint. Do not get up until all of those feelings pass.  Keep your home well-lit. Use night-lights as needed.  Remove tripping hazards, such as rugs, cords, and clutter.  Install grab bars by the toilet and in the tub and shower.  Use assistive devices such as a cane, walker, or wheelchair as needed to keep your balance. General instructions  Do not drink alcohol.  Ask your health care provider what activities are safe  for you, and what activities you should avoid.  Take over-the-counter and prescription medicines only as told by your health care provider. Get help  right away if you:  Have unsteadiness that suddenly worsens.  Have any of these: ? Severe headaches. ? Chest pain. ? Abdominal pain. ? Weakness or numbness on one side of your body. ? Vision problems. ? Difficulty speaking. ? An irregular heartbeat. ? A very fast pulse.  Feel confused. Summary  Ataxia is a condition that causes unsteadiness when walking and standing, poor coordination of body movements, and difficulty keeping a straight (upright) posture.  Ataxia occurs because of a problem with the part of the brain that controls coordination and stability (cerebellum).  The underlying condition that causes your ataxia needs to be treated. Treatment also focuses on helping you live with ataxia and improving your quality of life (supportive treatments).  Lie down right away if you become very unsteady, dizzy, or nauseous, or if you feel like you are going to faint. This information is not intended to replace advice given to you by your health care provider. Make sure you discuss any questions you have with your health care provider. Document Revised: 09/04/2017 Document Reviewed: 07/24/2017 Elsevier Patient Education  2020 [employer]  

## 2020-04-18 NOTE — Progress Notes (Signed)
Subjective:  Patient ID: James [name], male    DOB: Apr 24, 1946  Age: 74 y.o. MRN: [id number]  CC: Gait Problem  This visit occurred during the SARS-CoV-2 public health emergency.  Safety protocols were in place, including screening questions prior to the visit, additional usage of staff PPE, and extensive cleaning of exam room while observing appropriate contact time as indicated for disinfecting solutions.   NEW TO ME  HPI James [name] presents for concerns about a 1 month history of orthostatic dizziness and ataxia.  He tells me he has had a longstanding history of intermittent vertigo but over the last month the symptoms have worsened and change.  He feels like he is veering to the right.  He denies headache, blurred vision, slurred speech, changes in his hearing, or double vision.  He has had intermittent numbness in his fingers and toes.  He denies any weakness or tingling.  He complains of chronic fatigue.  He is very active and does not experience dyspnea on exertion unless he has climbed at least 3 flights of stairs.  He denies chest pain, diaphoresis, palpitations, or edema.  Outpatient Medications Prior to Visit  Medication Sig Dispense Refill  . acetaminophen (TYLENOL) 500 MG tablet Take 1,000 mg by mouth every 6 (six) hours as needed for mild pain.     . Multiple Vitamin (MULTIVITAMIN WITH MINERALS) TABS tablet Take 1 tablet by mouth every other day.     [name] omeprazole (PRILOSEC) 40 MG capsule TAKE 1 CAPSULE DAILY 30 MINS BEFORE BREAKFAST OR SUPPER, OKAY TO OPEN AND SPRINKLE ON APPLESAUCE 90 capsule 1  . pravastatin (PRAVACHOL) 40 MG tablet Take 1 tablet (40 mg total) by mouth at bedtime. 90 tablet 3  . vitamin B-12 (CYANOCOBALAMIN) 1000 MCG tablet 2,000 mcg daily.      No facility-administered medications prior to visit.    ROS Review of Systems  Constitutional: Positive for fatigue. Negative for appetite change, chills, diaphoresis and unexpected weight change.  HENT:  Negative.  Negative for sore throat, trouble swallowing and voice change.   Eyes: Negative for visual disturbance.  Respiratory: Negative for cough, chest tightness, shortness of breath and wheezing.   Cardiovascular: Negative for chest pain, palpitations and leg swelling.  Gastrointestinal: Negative for abdominal pain, constipation, diarrhea, nausea and vomiting.  Endocrine: Negative.   Genitourinary: Negative.  Negative for difficulty urinating, dysuria, flank pain and hematuria.  Musculoskeletal: Positive for gait problem. Negative for back pain, myalgias and neck pain.  Skin: Negative for color change, pallor and rash.  Neurological: Positive for dizziness and numbness. Negative for tremors, syncope, facial asymmetry, speech difficulty, weakness, light-headedness and headaches.  Hematological: Negative for adenopathy. Does not bruise/bleed easily.  Psychiatric/Behavioral: Negative.     Objective:  BP 138/78 (BP Location: Left Arm, Patient Position: Sitting, Cuff Size: Normal)   Pulse 63   Temp 98.2 F (36.8 C) (Oral)   Resp 16   Ht 5\' 6"  (1.676 m)   Wt 150 lb (68 kg)   SpO2 96%   BMI 24.21 kg/m   BP Readings from Last 3 Encounters:  04/18/20 138/78  02/02/20 138/82  01/12/20 122/80    Wt Readings from Last 3 Encounters:  04/18/20 150 lb (68 kg)  02/02/20 152 lb 3.2 oz (69 kg)  01/12/20 153 lb 12.8 oz (69.8 kg)    Physical Exam Vitals reviewed.  Constitutional:      Appearance: Normal appearance. He is not ill-appearing, toxic-appearing or diaphoretic.  HENT:  Mouth/Throat:     Mouth: Mucous membranes are moist.  Eyes:     General: No scleral icterus.    Extraocular Movements: Extraocular movements intact.     Conjunctiva/sclera: Conjunctivae normal.  Neck:     Thyroid: No thyroid mass or thyromegaly.     Vascular: Normal carotid pulses. Carotid bruit present.  Cardiovascular:     Rate and Rhythm: Normal rate and regular rhythm.     Heart sounds: No murmur  heard.   Pulmonary:     Effort: Pulmonary effort is normal.     Breath sounds: No stridor. No wheezing, rhonchi or rales.  Abdominal:     General: Abdomen is flat.     Palpations: There is no mass.     Tenderness: There is no abdominal tenderness. There is no guarding.  Musculoskeletal:        General: Normal range of motion.     Right lower leg: No edema.     Left lower leg: No edema.  Lymphadenopathy:     Cervical: No cervical adenopathy.  Skin:    General: Skin is warm and dry.     Coloration: Skin is not pale.  Neurological:     General: No focal deficit present.     Mental Status: He is alert.     Cranial Nerves: No cranial nerve deficit, dysarthria or facial asymmetry.     Sensory: Sensation is intact.     Motor: Motor function is intact.     Coordination: Romberg sign positive. Coordination normal.     Gait: Gait is intact.     Deep Tendon Reflexes: Reflexes normal. Babinski sign absent on the right side.     Reflex Scores:      Tricep reflexes are 1+ on the right side and 1+ on the left side.      Bicep reflexes are 1+ on the right side and 1+ on the left side.      Brachioradialis reflexes are 1+ on the right side and 1+ on the left side.      Patellar reflexes are 1+ on the right side and 1+ on the left side.      Achilles reflexes are 0 on the right side and 0 on the left side.    Lab Results  Component Value Date   WBC 4.3 04/18/2020   HGB 13.8 04/18/2020   HCT 40.5 04/18/2020   PLT 175 04/18/2020   GLUCOSE 91 04/18/2020   CHOL 141 02/02/2020   TRIG 48.0 02/02/2020   HDL 60.20 02/02/2020   LDLCALC 71 02/02/2020   ALT 10 02/02/2020   AST 25 02/02/2020   NA 142 04/18/2020   K 4.3 04/18/2020   CL 106 04/18/2020   CREATININE 0.90 04/18/2020   BUN 14 04/18/2020   CO2 28 04/18/2020   TSH 2.87 04/18/2020   PSA 1.85 12/21/2009   INR 1.21 07/19/2015   HGBA1C 5.7 02/02/2020    US BREAST LTD UNI LEFT INC AXILLA  Result Date: 08/05/2018 CLINICAL DATA:   74 year old male with palpable lump in the UPPER OUTER LEFT breast discovered on self-examination. EXAM: DIGITAL DIAGNOSTIC BILATERAL MAMMOGRAM WITH CAD AND TOMO ULTRASOUND LEFT BREAST COMPARISON:  None ACR Breast Density Category a: The breast tissue is almost entirely fatty. FINDINGS: 2D/3D full field views of both breasts and a spot compression view of the LEFT breast demonstrate mild bilateral gynecomastia. No suspicious mass, distortion or worrisome calcifications noted. Mammographic images were processed with CAD. On physical exam, minimal  thickening in the UPPER OUTER LEFT breast identified without discrete palpable mass. Targeted ultrasound is performed, showing no solid or cystic mass, distortion or abnormal shadowing within the UPPER or OUTER LEFT breast. IMPRESSION: 1. No mammographic, suspicious palpable or sonographic abnormality within the UPPER-OUTER LEFT breast, in the area of patient concern. 2. Mild bilateral gynecomastia 3. No mammographic evidence of breast malignancy RECOMMENDATION: No further imaging follow-up recommended. Clinical follow-up as indicated. I have discussed the findings and recommendations with the patient. Results were also provided in writing at the conclusion of the visit. If applicable, a reminder letter will be sent to the patient regarding the next appointment. BI-RADS CATEGORY  2: Benign. Electronically Signed   By: Margarette Canada M.D.   On: 08/05/2018 08:17   MM DIAG BREAST TOMO BILATERAL  Result Date: 08/05/2018 CLINICAL DATA:  74 year old male with palpable lump in the UPPER OUTER LEFT breast discovered on self-examination. EXAM: DIGITAL DIAGNOSTIC BILATERAL MAMMOGRAM WITH CAD AND TOMO ULTRASOUND LEFT BREAST COMPARISON:  None ACR Breast Density Category a: The breast tissue is almost entirely fatty. FINDINGS: 2D/3D full field views of both breasts and a spot compression view of the LEFT breast demonstrate mild bilateral gynecomastia. No suspicious mass, distortion or  worrisome calcifications noted. Mammographic images were processed with CAD. On physical exam, minimal thickening in the UPPER OUTER LEFT breast identified without discrete palpable mass. Targeted ultrasound is performed, showing no solid or cystic mass, distortion or abnormal shadowing within the UPPER or OUTER LEFT breast. IMPRESSION: 1. No mammographic, suspicious palpable or sonographic abnormality within the UPPER-OUTER LEFT breast, in the area of patient concern. 2. Mild bilateral gynecomastia 3. No mammographic evidence of breast malignancy RECOMMENDATION: No further imaging follow-up recommended. Clinical follow-up as indicated. I have discussed the findings and recommendations with the patient. Results were also provided in writing at the conclusion of the visit. If applicable, a reminder letter will be sent to the patient regarding the next appointment. BI-RADS CATEGORY  2: Benign. Electronically Signed   By: Margarette Canada M.D.   On: 08/05/2018 08:17    Assessment & Plan:   Xaidyn was seen today for gait problem.  Diagnoses and all orders for this visit:  Vertigo- He has worsening vertigo with concerning neurological signs and symptoms.  I recommended that he undergo an MRI of the brain with contrast to see if there is demyelination, NPH, mass, tumor, or CVA. -     BASIC METABOLIC PANEL WITH GFR; Future -     BASIC METABOLIC PANEL WITH GFR  Need for Tdap vaccination -     Tdap vaccine greater than or equal to 7yo IM  B12 deficiency- His H&H and folate levels are normal.  Will continue parenteral B12 replacement therapy. -     cyanocobalamin ((VITAMIN B-12)) injection 1,000 mcg -     CBC with Differential/Platelet; Future -     Folate; Future -     Folate -     CBC with Differential/Platelet  Ataxia- See above. -     MR Brain W Wo Contrast; Future -     CBC with Differential/Platelet; Future -     Vitamin B1; Future -     Vitamin B1 -     CBC with Differential/Platelet  Bilateral  carotid bruits- Will screen for carotid artery disease. 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 -  MR Brain W Wo Contrast; Future -     Vitamin B1; Future -     Vitamin B1  Thrombocytopenia ([place])- His platelet count is normal now. -     CBC with Differential/Platelet; Future -     Folate; Future -     Folate -     CBC with Differential/Platelet  Chronic fatigue- Evaluation for secondary causes is unremarkable. -     TSH; Future -     TSH   I am having [name] maintain his multivitamin with minerals, acetaminophen, vitamin B-12, pravastatin, and omeprazole. We administered cyanocobalamin. We will continue to administer cyanocobalamin.  Meds ordered this encounter  Medications  . cyanocobalamin ((VITAMIN B-12)) injection 1,000 mcg   I spent 50 minutes in preparing to see the patient by review of recent labs, imaging and procedures, obtaining and reviewing separately obtained history, communicating with the patient and family or caregiver, ordering medications, tests or procedures, and documenting clinical information in the EHR including the differential Dx, treatment, and any further evaluation and other management of 1. Vertigo 2. B12 deficiency 3. Ataxia 4. Bilateral carotid bruits 5. Cerebellar ataxia in diseases classified elsewhere ([place]) 6. Thrombocytopenia ([place]) 7. Chronic fatigue     Follow-up: Return in about 4 weeks (around 05/16/2020).  Scarlette Calico, MD

## 2020-04-23 LAB — BASIC METABOLIC PANEL WITH GFR
BUN: 14 mg/dL (ref 7–25)
CO2: 28 mmol/L (ref 20–32)
Calcium: 8.8 mg/dL (ref 8.6–10.3)
Chloride: 106 mmol/L (ref 98–110)
Creat: 0.9 mg/dL (ref 0.70–1.18)
GFR, Est African American: 96 mL/min/{1.73_m2} (ref >=60)
GFR, Est Non African American: 83 mL/min/{1.73_m2} (ref >=60)
Glucose, Bld: 91 mg/dL (ref 65–99)
Potassium: 4.3 mmol/L (ref 3.5–5.3)
Sodium: 142 mmol/L (ref 135–146)

## 2020-04-23 LAB — CBC WITH DIFFERENTIAL/PLATELET
Absolute Monocytes: 473 cells/uL (ref 200–950)
Basophils Absolute: 30 cells/uL (ref 0–200)
Basophils Relative: 0.7 %
Eosinophils Absolute: 211 cells/uL (ref 15–500)
Eosinophils Relative: 4.9 %
HCT: 40.5 % (ref 38.5–50.0)
Hemoglobin: 13.8 g/dL (ref 13.2–17.1)
Lymphs Abs: 1312 cells/uL (ref 850–3900)
MCH: 32.7 pg (ref 27.0–33.0)
MCHC: 34.1 g/dL (ref 32.0–36.0)
MCV: 96 fL (ref 80.0–100.0)
MPV: 9.3 fL (ref 7.5–12.5)
Monocytes Relative: 11 %
Neutro Abs: 2275 cells/uL (ref 1500–7800)
Neutrophils Relative %: 52.9 %
Platelets: 175 10*3/uL (ref 140–400)
RBC: 4.22 10*6/uL (ref 4.20–5.80)
RDW: 14 % (ref 11.0–15.0)
Total Lymphocyte: 30.5 %
WBC: 4.3 10*3/uL (ref 3.8–10.8)

## 2020-04-23 LAB — FOLATE: Folate: 21.3 ng/mL

## 2020-04-23 LAB — VITAMIN B1: Vitamin B1 (Thiamine): 21 nmol/L (ref 8–30)

## 2020-04-23 LAB — TSH: TSH: 2.87 mIU/L (ref 0.40–4.50)

## 2020-05-06 HISTORY — PX: MOHS SURGERY: SUR867

## 2020-05-11 DIAGNOSIS — Z20828 Contact with and (suspected) exposure to other viral communicable diseases: Secondary | ICD-10-CM | POA: Diagnosis not present

## 2020-05-15 ENCOUNTER — Other Ambulatory Visit: Payer: Self-pay | Admitting: Internal Medicine

## 2020-05-15 ENCOUNTER — Encounter: Payer: Self-pay | Admitting: Internal Medicine

## 2020-05-15 DIAGNOSIS — Z85820 Personal history of malignant melanoma of skin: Secondary | ICD-10-CM | POA: Diagnosis not present

## 2020-05-15 DIAGNOSIS — Z85828 Personal history of other malignant neoplasm of skin: Secondary | ICD-10-CM | POA: Diagnosis not present

## 2020-05-15 DIAGNOSIS — C44311 Basal cell carcinoma of skin of nose: Secondary | ICD-10-CM | POA: Diagnosis not present

## 2020-05-15 DIAGNOSIS — F40232 Fear of other medical care: Secondary | ICD-10-CM | POA: Insufficient documentation

## 2020-05-15 MED ORDER — DIAZEPAM 5 MG PO TABS: 5.0000 mg | ORAL_TABLET | Freq: Four times a day (QID) | ORAL | 0 refills | Status: DC | PRN

## 2020-05-16 ENCOUNTER — Other Ambulatory Visit: Payer: Self-pay

## 2020-05-16 ENCOUNTER — Ambulatory Visit (HOSPITAL_COMMUNITY)
Admission: RE | Admit: 2020-05-16 | Discharge: 2020-05-16 | Disposition: A | Discharge status: Home / Self Care | Payer: Medicare Other | Source: Ambulatory Visit | Attending: Cardiology | Admitting: Cardiology

## 2020-05-16 DIAGNOSIS — R0989 Other specified symptoms and signs involving the circulatory and respiratory systems: Secondary | ICD-10-CM

## 2020-05-22 DIAGNOSIS — Z4802 Encounter for removal of sutures: Secondary | ICD-10-CM | POA: Diagnosis not present

## 2020-05-23 ENCOUNTER — Other Ambulatory Visit: Payer: Self-pay | Admitting: Internal Medicine

## 2020-05-23 ENCOUNTER — Other Ambulatory Visit: Payer: Self-pay

## 2020-05-23 ENCOUNTER — Ambulatory Visit
Admission: RE | Admit: 2020-05-23 | Discharge: 2020-05-23 | Disposition: A | Discharge status: Home / Self Care | Payer: Medicare Other | Source: Ambulatory Visit | Attending: Internal Medicine | Admitting: Internal Medicine

## 2020-05-23 DIAGNOSIS — I6389 Other cerebral infarction: Secondary | ICD-10-CM | POA: Insufficient documentation

## 2020-05-23 DIAGNOSIS — R9082 White matter disease, unspecified: Secondary | ICD-10-CM | POA: Diagnosis not present

## 2020-05-23 DIAGNOSIS — R270 Ataxia, unspecified: Secondary | ICD-10-CM

## 2020-05-23 DIAGNOSIS — G3281 Cerebellar ataxia in diseases classified elsewhere: Secondary | ICD-10-CM

## 2020-05-23 MED ORDER — GADOBENATE DIMEGLUMINE 529 MG/ML IV SOLN
15.0000 mL | Freq: Once | INTRAVENOUS | Status: AC | PRN
  Administered 2020-05-23: 15 mL via INTRAVENOUS

## 2020-05-24 ENCOUNTER — Other Ambulatory Visit: Payer: Self-pay

## 2020-05-24 ENCOUNTER — Ambulatory Visit (INDEPENDENT_AMBULATORY_CARE_PROVIDER_SITE_OTHER): Payer: Medicare Other

## 2020-05-24 DIAGNOSIS — E538 Deficiency of other specified B group vitamins: Secondary | ICD-10-CM

## 2020-05-24 MED ORDER — CYANOCOBALAMIN 1000 MCG/ML IJ SOLN
1000.0000 ug | Freq: Once | INTRAMUSCULAR | Status: AC
  Administered 2020-05-24: 1000 ug via INTRAMUSCULAR

## 2020-05-24 NOTE — Progress Notes (Signed)
Pt here for monthly B12 injection per Dr Quay Burow.  B12 1069mcg given IM left deltoid and pt tolerated injection well.  Pt to schedule next injection upon check out.

## 2020-05-28 ENCOUNTER — Ambulatory Visit (INDEPENDENT_AMBULATORY_CARE_PROVIDER_SITE_OTHER): Payer: Medicare Other | Admitting: Diagnostic Neuroimaging

## 2020-05-28 ENCOUNTER — Encounter: Payer: Self-pay | Admitting: Diagnostic Neuroimaging

## 2020-05-28 VITALS — BP 118/70 | HR 50 | Ht 66.0 in | Wt 152.0 lb

## 2020-05-28 DIAGNOSIS — E782 Mixed hyperlipidemia: Secondary | ICD-10-CM

## 2020-05-28 DIAGNOSIS — R269 Unspecified abnormalities of gait and mobility: Secondary | ICD-10-CM | POA: Diagnosis not present

## 2020-05-28 DIAGNOSIS — D696 Thrombocytopenia, unspecified: Secondary | ICD-10-CM

## 2020-05-28 DIAGNOSIS — I6389 Other cerebral infarction: Secondary | ICD-10-CM | POA: Diagnosis not present

## 2020-05-28 NOTE — Progress Notes (Signed)
[facility]  PATIENT: James [name] DOB: 1945-12-21  REFERRING CLINICIAN: Janith Lima, MD HISTORY FROM: patient  REASON FOR VISIT: new consult    HISTORICAL  CHIEF COMPLAINT:  Chief Complaint  Patient presents with  . Infarction R temporal lobe    rm 7 New Pt  wife- Cathy    HISTORY OF PRESENT ILLNESS:   74 year old male with abnormal MRI brain.  For past 1 to 2 years patient was having intermittent episodes of orthostatic lightheadedness and dizziness when he bends over.  3 months ago had an episode where he was unloading his car and then was veering to the right side.  Symptoms have been intermittent.  Otherwise he is quite active playing tennis and golf.  He occasionally feels some prodromal lightheadedness near syncopal events.  Has a history of gastric cancer status post gastrectomy, B12 deficiency on replacement.  Patient saw PCP had MRI of the brain which showed a punctate DWI hyperintensity in the right temporal lobe.  Patient referred here for further evaluation.   REVIEW OF SYSTEMS: Full 14 system review of systems performed and negative with exception of: As per HPI.  ALLERGIES: Allergies  Allergen Reactions  . Codeine Nausea And Vomiting    HOME MEDICATIONS: Outpatient Medications Prior to Visit  Medication Sig Dispense Refill  . acetaminophen (TYLENOL) 500 MG tablet Take 1,000 mg by mouth every 6 (six) hours as needed for mild pain.     . Cholecalciferol (D3-1000 PO) Take by mouth daily.    . Multiple Vitamin (MULTIVITAMIN WITH MINERALS) TABS tablet Take 1 tablet by mouth every other day.     [name] omeprazole (PRILOSEC) 40 MG capsule TAKE 1 CAPSULE DAILY 30 MINS BEFORE BREAKFAST OR SUPPER, OKAY TO OPEN AND SPRINKLE ON APPLESAUCE 90 capsule 1  . pravastatin (PRAVACHOL) 40 MG tablet Take 1 tablet (40 mg total) by mouth at bedtime. 90 tablet 3  . vitamin B-12 (CYANOCOBALAMIN) 1000 MCG tablet 2,000 mcg daily.     . diazepam (VALIUM) 5 MG  tablet Take 1 tablet (5 mg total) by mouth every 6 (six) hours as needed for anxiety. (Patient not taking: Reported on 05/28/2020) 10 tablet 0   Facility-Administered Medications Prior to Visit  Medication Dose Route Frequency Provider Last Rate Last Admin  . cyanocobalamin ((VITAMIN B-12)) injection 1,000 mcg  1,000 mcg Intramuscular Q30 days Janith Lima, MD   1,000 mcg at 04/18/20 1705    PAST MEDICAL HISTORY: Past Medical History:  Diagnosis Date  . Abnormal EKG    NS ST-T EKG Changes, (-) Nuclear Stress Test 03/2006  . Allergy   . Anemia    iron deficency  . Arthritis   . B12 deficiency 06/05/2015   B12 Low NL at 271 methtylmalonic acid high  . Cardiac murmur    nothing to be concerned with   . Cataract    extractions  . Duodenal ulcer 1972   transfusion required, 1 unit packed cells  . Dysphagia    history throat/stomach  cancer "can swallow some, uses more pureed type foods"  . Dyspnea    at times  . Esophageal reflux    no currlently  . Gastric cancer ([place]) 07/18/15   invasive adenocarcinoma  . Gastrostomy tube in place [facility]) 2016   intermittent feeding  . Hx of basal cell carcinoma   . Hyperlipemia    no per pt 04-11-16  . Melanoma ([place])   . Prostate cancer ([place]) 09/06/14   last radiation  4'16   . S/P radiation therapy 11/29/2014 through 01/23/2015   Prostate 7800 cGy in 40 sessions, seminal vesicles 5600 cGy in 40 sessions   . Skin cancer, basal cell   . Transfusion history    '72 -s/p surgery for duodenal ulcer, 12/17    PAST SURGICAL HISTORY: Past Surgical History:  Procedure Laterality Date  . BALLOON DILATION N/A 09/12/2015   Procedure: BALLOON DILATION;  Surgeon: Gatha Mayer, MD;  Location: [facility];  Service: Endoscopy;  Laterality: N/A;  . BALLOON DILATION N/A 09/21/2015   Procedure: BALLOON DILATION;  Surgeon: Gatha Mayer, MD;  [facility]: [facility];  Service:  Endoscopy;  Laterality: N/A;  . BALLOON DILATION N/A 10/04/2015   Procedure: BALLOON DILATION;  Surgeon: Milus Banister, MD;  [facility]: [facility];  Service: Endoscopy;  Laterality: N/A;  . BALLOON DILATION N/A 12/10/2015   Procedure: BALLOON DILATION;  Surgeon: Gatha Mayer, MD;  [facility];  Service: Endoscopy;  Laterality: N/A;  . BALLOON DILATION N/A 10/10/2016   Procedure: BALLOON DILATION;  Surgeon: Gatha Mayer, MD;  [facility];  Service: Endoscopy;  Laterality: N/A;  . CATARACT EXTRACTION, BILATERAL Bilateral   . COLONOSCOPY    . colonoscopy with polypectomy  02/2012   Dr Sharlett Iles  . ESOPHAGOGASTRODUODENOSCOPY (EGD) WITH PROPOFOL N/A 08/17/2015   Procedure: ESOPHAGOGASTRODUODENOSCOPY (EGD) WITH PROPOFOL;  Surgeon: Gatha Mayer, MD;  Location: [place];  Service: Endoscopy;  Laterality: N/A;  . ESOPHAGOGASTRODUODENOSCOPY (EGD) WITH PROPOFOL N/A 09/12/2015   Procedure: ESOPHAGOGASTRODUODENOSCOPY (EGD) WITH PROPOFOL;  Surgeon: Gatha Mayer, MD;  [facility];  Service: Endoscopy;  Laterality: N/A;  . ESOPHAGOGASTRODUODENOSCOPY (EGD) WITH PROPOFOL N/A 09/21/2015   Procedure: ESOPHAGOGASTRODUODENOSCOPY (EGD) WITH PROPOFOL;  Surgeon: Gatha Mayer, MD;  [facility];  Service: Endoscopy;  Laterality: N/A;  . ESOPHAGOGASTRODUODENOSCOPY (EGD) WITH PROPOFOL N/A 10/04/2015   Procedure: ESOPHAGOGASTRODUODENOSCOPY (EGD) WITH PROPOFOL;  Surgeon: Milus Banister, MD;  [facility];  Service: Endoscopy;  Laterality: N/A;  possible stricture  . ESOPHAGOGASTRODUODENOSCOPY (EGD) WITH PROPOFOL N/A 12/10/2015   Procedure: ESOPHAGOGASTRODUODENOSCOPY (EGD) WITH PROPOFOL;  Surgeon: Gatha Mayer, MD;  [facility];  Service: Endoscopy;  Laterality: N/A;  . ESOPHAGOGASTRODUODENOSCOPY (EGD) WITH PROPOFOL N/A 02/06/2016   Procedure: ESOPHAGOGASTRODUODENOSCOPY (EGD) WITH PROPOFOL;  Surgeon: Gatha Mayer, MD;  [facility];   Service: Endoscopy;  Laterality: N/A;  . ESOPHAGOGASTRODUODENOSCOPY (EGD) WITH PROPOFOL N/A 10/10/2016   Procedure: ESOPHAGOGASTRODUODENOSCOPY (EGD) WITH PROPOFOL;  Surgeon: Gatha Mayer, MD;  [facility];  Service: Endoscopy;  Laterality: N/A;  needs APC   . EUS N/A 06/14/2015   Procedure: ESOPHAGEAL ENDOSCOPIC ULTRASOUND (EUS) RADIAL;  Surgeon: Milus Banister, MD;  [facility];  Service: Endoscopy;  Laterality: N/A;  . HOT HEMOSTASIS N/A 10/10/2016   Procedure: HOT HEMOSTASIS (ARGON PLASMA COAGULATION/BICAP);  Surgeon: Gatha Mayer, MD;  [facility];  Service: Endoscopy;  Laterality: N/A;  . KNEE ARTHROSCOPY Right    Right Knee, [place] [facility]  . LAPAROSCOPIC GASTRECTOMY N/A 07/18/2015   Procedure: SUB TOTAL GASTRECTOMY;  Surgeon: Stark Klein, MD;  [facility];  Service: General;  Laterality: N/A;  . LAPAROSCOPIC GASTROSTOMY N/A 07/18/2015   Procedure: FEEDING TUBE PLACEMENT;  Surgeon: Stark Klein, MD;  [facility];  Service: General;  Laterality: N/A;  . LAPAROSCOPY N/A 07/18/2015   Procedure: LAPAROSCOPY DIAGNOSTIC;  Surgeon: Stark Klein, MD;  [facility];  Service: General;  Laterality: N/A;  . MELANOMA EXCISION  02-01-16  abdominal wall  . MOHS SURGERY  05/2020   nose  . PROSTATE BIOPSY  09/06/14  . ROTATOR CUFF REPAIR Right    R shoulder, [place]  . SAVORY DILATION N/A 02/06/2016   Procedure: SAVORY DILATION;  Surgeon: Gatha Mayer, MD;  [facility];  Service: Endoscopy;  Laterality: N/A;  . TOTAL SHOULDER ARTHROPLASTY Left 05/07/2017   Procedure: LEFT REVERSE TOTAL SHOULDER ARTHROPLASTY;  Surgeon: Justice Britain, MD;  Location: [place];  Service: Orthopedics;  Laterality: Left;  . TRIGGER FINGER RELEASE Left 1998  . UPPER GASTROINTESTINAL ENDOSCOPY     x14  . VASECTOMY      FAMILY HISTORY: Family History  Problem Relation Age of Onset  . Diabetes Father   . Heart attack Father 22  . Liver disease Father        Related  to Alcohol Use   . Alcohol abuse Mother   . Cirrhosis Mother        died in her 79s  . Multiple myeloma Brother   . Diabetes Sister   . Colon cancer Neg Hx   . Stomach cancer Neg Hx   . Stroke Neg Hx   . Esophageal cancer Neg Hx   . Rectal cancer Neg Hx     SOCIAL HISTORY: Social History   Socioeconomic History  . Marital status: Married    Spouse name: Tye [place]  . Number of children: 2  . Years of education: Not on file  . Highest education level: Not on file  Occupational History  . Occupation: retired  Tobacco Use  . Smoking status: Former Smoker    Packs/day: 0.50    Years: 5.00    Pack years: 2.50    Types: Cigarettes    Quit date: 10/07/1967    Years since quitting: 52.6  . Smokeless tobacco: Never Used  . Tobacco comment: smoked 1963- 1969 , up to < 1 ppd  Vaping Use  . Vaping Use: Never used  Substance and Sexual Activity  . Alcohol use: Yes    Comment: maybe once a week  . Drug use: No  . Sexual activity: Yes  Other Topics Concern  . 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 Difficulty of Paying Living Expenses: Not on file  Food Insecurity:   . Worried About Charity fundraiser in the Last Year: Not on file  . Ran Out of Food in the Last Year: Not on file  Transportation Needs:   . Lack of Transportation (Medical): Not on file  . Lack of Transportation (Non-Medical): Not on file  Physical Activity:   . Days of Exercise per Week: Not on file  . Minutes of Exercise per Session: Not on file  Stress:   . Feeling of Stress : Not on file  Social Connections:   . Frequency of Communication with Friends and Family: Not on file  . Frequency of Social Gatherings with Friends and Family: Not on file  . Attends Religious Services: Not on file  . Active  Member of Clubs or Organizations: Not on file  . Attends Archivist Meetings: Not on file  . Marital Status: Not on file  Intimate Partner Violence:   . Fear of Current or Ex-Partner: Not  on file  . Emotionally Abused: Not on file  . Physically Abused: Not on file  . Sexually Abused: Not on file     PHYSICAL EXAM  GENERAL EXAM/CONSTITUTIONAL: Vitals:  Vitals:   05/28/20 1336  BP: 118/70  Pulse: (!) 50  Weight: 152 lb (68.9 kg)  Height: 5' 6"  (1.676 m)     Body mass index is 24.53 kg/m. Wt Readings from Last 3 Encounters:  05/28/20 152 lb (68.9 kg)  04/18/20 150 lb (68 kg)  02/02/20 152 lb 3.2 oz (69 kg)     Patient is in no distress; well developed, nourished and groomed; neck is supple  CARDIOVASCULAR:  Examination of carotid arteries is normal; no carotid bruits  BRADYCARDIA; SLIGHTLY IRREGULAR  Examination of peripheral vascular system by observation and palpation is normal  EYES:  Ophthalmoscopic exam of optic discs and posterior segments is normal; no papilledema or hemorrhages  No exam data present  MUSCULOSKELETAL:  Gait, strength, tone, movements noted in Neurologic exam below  NEUROLOGIC: MENTAL STATUS:  No flowsheet data found.  awake, alert, oriented to person, place and time  recent and remote memory intact  normal attention and concentration  language fluent, comprehension intact, naming intact  fund of knowledge appropriate  CRANIAL NERVE:   2nd - no papilledema on fundoscopic exam  2nd, 3rd, 4th, 6th - pupils equal and reactive to light, visual fields full to confrontation, extraocular muscles intact, no nystagmus  5th - facial sensation symmetric  7th - facial strength symmetric  8th - hearing intact  9th - palate elevates symmetrically, uvula midline  11th - shoulder shrug symmetric  12th - tongue protrusion midline  MOTOR:   normal bulk and tone, full strength in the BUE, BLE  SENSORY:   normal and  symmetric to light touch, temperature, vibration  COORDINATION:   finger-nose-finger, fine finger movements normal  REFLEXES:   deep tendon reflexes TRACE and symmetric  GAIT/STATION:   narrow based gait; romberg is negative     DIAGNOSTIC DATA (LABS, IMAGING, TESTING) - I reviewed patient records, labs, notes, testing and imaging myself where available.  Lab Results  Component Value Date   WBC 4.3 04/18/2020   HGB 13.8 04/18/2020   HCT 40.5 04/18/2020   MCV 96.0 04/18/2020   PLT 175 04/18/2020      Component Value Date/Time   NA 142 04/18/2020 1359   NA 139 03/27/2017 0000   NA 139 09/24/2016 0852   K 4.3 04/18/2020 1359   K 4.0 09/24/2016 0852   CL 106 04/18/2020 1359   CO2 28 04/18/2020 1359   CO2 26 09/24/2016 0852   GLUCOSE 91 04/18/2020 1359   GLUCOSE 97 09/24/2016 0852   BUN 14 04/18/2020 1359   BUN 15 03/31/2017 0000   BUN 13.3 09/24/2016 0852   CREATININE 0.90 04/18/2020 1359   CREATININE 0.7 09/24/2016 0852   CALCIUM 8.8 04/18/2020 1359   CALCIUM 8.9 09/24/2016 0852   PROT 6.4 02/02/2020 0955   PROT 6.2 03/27/2017 0000   PROT 6.6 09/24/2016 0852   ALBUMIN 4.1 02/02/2020 0955   ALBUMIN 4.2 03/27/2017 0000   ALBUMIN 3.9 09/24/2016 0852   AST 25 02/02/2020 0955   AST 21 09/24/2016 0852   ALT 10 02/02/2020 0955   ALT 11 09/24/2016 0852   ALKPHOS 58 02/02/2020 0955   ALKPHOS 80 09/24/2016 0852   BILITOT 0.6 02/02/2020 0955   BILITOT 0.2 03/27/2017 0000   BILITOT 0.61 09/24/2016 0852   GFRNONAA 83 04/18/2020  Belt 04/18/2020 1359   Lab Results  Component Value Date   CHOL 141 02/02/2020   HDL 60.20 02/02/2020   LDLCALC 71 02/02/2020   TRIG 48.0 02/02/2020   CHOLHDL 2 02/02/2020   Lab Results  Component Value Date   HGBA1C 5.7 02/02/2020   Lab Results  Component Value Date   OERQSXQK20 813 (H) 02/02/2020   Lab Results  Component Value Date   TSH 2.87 04/18/2020     05/16/20 carotid u/s Right Carotid: Velocities in the  right ICA are consistent with a 1-39%  stenosis.   Left Carotid: There is no evidence of stenosis in the left ICA. The  extracranial vessels were near-normal with only minimal wall thickening or plaque.   Vertebrals: Bilateral vertebral arteries demonstrate antegrade flow.  Subclavians: Normal flow hemodynamics were seen in bilateral subclavian        arteries.   05/23/20 MRI brain [I reviewed images myself and agree with interpretation. -VRP]  1. Single punctate focus of restricted diffusion in the right posterior temporal juxtacortical white matter, consistent with an acute to subacute infarct. 2. Few scattered foci of T2 hyperintensity within the white matter of the cerebral hemispheres, nonspecific. This may represent mild chronic microvascular ischemic changes. 3. Moderate parenchymal volume loss.    ASSESSMENT AND PLAN  74 y.o. year old male here with:  Dx:  1. Gait difficulty   2. 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 Thrombocytopenia ([place])   4. HYPERLIPIDEMIA      PLAN:  RIGHT TEMPORAL LOBE INFARCTION (punctate DWI hyperintensity; likely asymptomatic incidental finding) - continue statin - could consider aspirin 53m daily, but caution due to thrombocytopenia and history of stomach ulcers - complete workup (echocardiogram)  INTERMITTENT BALANCE DIFFICULTY - could be related to prior B12 deficiency; continue B12 replacement - continue physical activity  ORTHOSTATIC LIGHTHEADEDNESS - stay hydrated  BRADYCARDIA  - follow up with PCP  Orders Placed This Encounter  Procedures  . ECHOCARDIOGRAM COMPLETE BUBBLE STUDY   Return for pending if symptoms worsen or fail to improve, return to PCP.    VPenni Bombard MD 88/87/1959 27:47PM Certified in Neurology, Neurophysiology and Neuroimaging  [facility] [address] [place] [phone]

## 2020-05-28 NOTE — Patient Instructions (Signed)
ORTHOSTATIC LIGHTHEADEDNESS - stay hydrated  BRADYCARDIA  - follow up with PCP  RIGHT TEMPORAL LOBE INFARCTION (likely asymptomatic incidental finding) - continue statin - could consider aspirin 81mg  daily, but caution due to low platelets and history of stomach ulcers  INTERMITTENT BALANCE DIFFICULTY - could be related to prior B12 deficiency; continue B12 replacement - continue physical activity

## 2020-06-19 DIAGNOSIS — R5383 Other fatigue: Secondary | ICD-10-CM | POA: Diagnosis not present

## 2020-06-19 DIAGNOSIS — Z20828 Contact with and (suspected) exposure to other viral communicable diseases: Secondary | ICD-10-CM | POA: Diagnosis not present

## 2020-06-19 DIAGNOSIS — R6883 Chills (without fever): Secondary | ICD-10-CM | POA: Diagnosis not present

## 2020-06-21 ENCOUNTER — Other Ambulatory Visit: Payer: Self-pay

## 2020-06-21 ENCOUNTER — Encounter: Payer: Self-pay | Admitting: *Deleted

## 2020-06-21 ENCOUNTER — Ambulatory Visit (HOSPITAL_COMMUNITY): Payer: Medicare Other | Attending: Cardiology

## 2020-06-21 DIAGNOSIS — I6389 Other cerebral infarction: Secondary | ICD-10-CM | POA: Diagnosis not present

## 2020-06-21 LAB — ECHOCARDIOGRAM COMPLETE BUBBLE STUDY
Area-P 1/2: 3.21 cm2
Radius: 1.2 cm
S' Lateral: 3.1 cm

## 2020-06-21 MED ORDER — SODIUM CHLORIDE 0.9% FLUSH
10.0000 mL | INTRAVENOUS | Status: DC | PRN
  Administered 2020-06-21: 4 mL via INTRAVENOUS

## 2020-06-28 ENCOUNTER — Ambulatory Visit (INDEPENDENT_AMBULATORY_CARE_PROVIDER_SITE_OTHER): Payer: Medicare Other

## 2020-06-28 ENCOUNTER — Other Ambulatory Visit: Payer: Self-pay

## 2020-06-28 DIAGNOSIS — E538 Deficiency of other specified B group vitamins: Secondary | ICD-10-CM | POA: Diagnosis not present

## 2020-06-28 MED ORDER — CYANOCOBALAMIN 1000 MCG/ML IJ SOLN
1000.0000 ug | Freq: Once | INTRAMUSCULAR | Status: AC
Start: 2020-06-28 — End: 2020-06-28
  Administered 2020-06-28: 1000 ug via INTRAMUSCULAR

## 2020-06-28 NOTE — Progress Notes (Signed)
Vit B12 given w/o complications. 

## 2020-08-01 ENCOUNTER — Ambulatory Visit (INDEPENDENT_AMBULATORY_CARE_PROVIDER_SITE_OTHER): Payer: Medicare Other

## 2020-08-01 ENCOUNTER — Other Ambulatory Visit: Payer: Self-pay

## 2020-08-01 DIAGNOSIS — E538 Deficiency of other specified B group vitamins: Secondary | ICD-10-CM

## 2020-08-01 DIAGNOSIS — C61 Malignant neoplasm of prostate: Secondary | ICD-10-CM | POA: Diagnosis not present

## 2020-08-01 NOTE — Progress Notes (Signed)
Pt here for monthly B12 injection per Dr Burns. B12 1000mcg given IM left deltoid and pt tolerated injection well.  Pt to schedule next B12 injection upon check out.  

## 2020-08-04 ENCOUNTER — Ambulatory Visit: Payer: Medicare Other | Attending: Internal Medicine

## 2020-08-04 DIAGNOSIS — Z23 Encounter for immunization: Secondary | ICD-10-CM

## 2020-08-04 NOTE — Progress Notes (Signed)
° °  Covid-19 Vaccination Clinic  Name:  James [name]    MRN: [id number] DOB: [birth year]  08/04/2020  James [name] was observed post Covid-19 immunization for 15 minutes without incident. He was provided with Vaccine Information Sheet and instruction to access the V-Safe system.   James [name] was instructed to call 911 with any severe reactions post vaccine:  Difficulty breathing   Swelling of face and throat   A fast heartbeat   A bad rash all over body   Dizziness and weakness

## 2020-08-08 DIAGNOSIS — N5201 Erectile dysfunction due to arterial insufficiency: Secondary | ICD-10-CM | POA: Diagnosis not present

## 2020-08-08 DIAGNOSIS — Z8546 Personal history of malignant neoplasm of prostate: Secondary | ICD-10-CM | POA: Diagnosis not present

## 2020-08-13 ENCOUNTER — Encounter: Payer: Self-pay | Admitting: Internal Medicine

## 2020-08-14 MED ORDER — OMEPRAZOLE 40 MG PO CPDR
DELAYED_RELEASE_CAPSULE | ORAL | 1 refills | Status: DC
Start: 2020-08-14 — End: 2021-05-13

## 2020-08-29 ENCOUNTER — Other Ambulatory Visit: Payer: Self-pay

## 2020-08-29 ENCOUNTER — Ambulatory Visit (INDEPENDENT_AMBULATORY_CARE_PROVIDER_SITE_OTHER): Payer: Medicare Other

## 2020-08-29 DIAGNOSIS — E538 Deficiency of other specified B group vitamins: Secondary | ICD-10-CM | POA: Diagnosis not present

## 2020-08-29 NOTE — Progress Notes (Signed)
B12 given and tolerated well °

## 2020-09-03 DIAGNOSIS — Z23 Encounter for immunization: Secondary | ICD-10-CM | POA: Diagnosis not present

## 2020-09-06 ENCOUNTER — Ambulatory Visit: Payer: Medicare Other

## 2020-10-01 ENCOUNTER — Other Ambulatory Visit: Payer: Self-pay

## 2020-10-01 ENCOUNTER — Ambulatory Visit (INDEPENDENT_AMBULATORY_CARE_PROVIDER_SITE_OTHER): Payer: Medicare Other

## 2020-10-01 DIAGNOSIS — E538 Deficiency of other specified B group vitamins: Secondary | ICD-10-CM | POA: Diagnosis not present

## 2020-10-01 MED ORDER — CYANOCOBALAMIN 1000 MCG/ML IJ SOLN
1000.0000 ug | Freq: Once | INTRAMUSCULAR | Status: AC
  Administered 2020-10-01: 09:00:00 1000 ug via INTRAMUSCULAR

## 2020-10-01 NOTE — Progress Notes (Signed)
Vitamin b12 given w/o any complications.

## 2020-10-11 ENCOUNTER — Telehealth: Payer: Self-pay | Admitting: Oncology

## 2020-10-11 ENCOUNTER — Other Ambulatory Visit: Payer: Self-pay

## 2020-10-11 ENCOUNTER — Inpatient Hospital Stay: Payer: Medicare Other

## 2020-10-11 ENCOUNTER — Inpatient Hospital Stay: Payer: Medicare Other | Attending: Oncology | Admitting: Oncology

## 2020-10-11 VITALS — BP 141/89 | HR 73 | Temp 97.8°F | Resp 18 | Ht 66.0 in | Wt 152.7 lb

## 2020-10-11 DIAGNOSIS — C165 Malignant neoplasm of lesser curvature of stomach, unspecified: Secondary | ICD-10-CM

## 2020-10-11 DIAGNOSIS — R1310 Dysphagia, unspecified: Secondary | ICD-10-CM | POA: Insufficient documentation

## 2020-10-11 DIAGNOSIS — Z923 Personal history of irradiation: Secondary | ICD-10-CM | POA: Diagnosis not present

## 2020-10-11 DIAGNOSIS — E538 Deficiency of other specified B group vitamins: Secondary | ICD-10-CM | POA: Insufficient documentation

## 2020-10-11 DIAGNOSIS — C161 Malignant neoplasm of fundus of stomach: Secondary | ICD-10-CM | POA: Insufficient documentation

## 2020-10-11 DIAGNOSIS — R6881 Early satiety: Secondary | ICD-10-CM | POA: Insufficient documentation

## 2020-10-11 DIAGNOSIS — Z85820 Personal history of malignant melanoma of skin: Secondary | ICD-10-CM | POA: Diagnosis not present

## 2020-10-11 DIAGNOSIS — Z8546 Personal history of malignant neoplasm of prostate: Secondary | ICD-10-CM | POA: Insufficient documentation

## 2020-10-11 LAB — CBC WITH DIFFERENTIAL (CANCER CENTER ONLY)
Abs Immature Granulocytes: 0.01 10*3/uL (ref 0.00–0.07)
Basophils Absolute: 0 10*3/uL (ref 0.0–0.1)
Basophils Relative: 1 %
Eosinophils Absolute: 0.2 10*3/uL (ref 0.0–0.5)
Eosinophils Relative: 5 %
HCT: 39.2 % (ref 39.0–52.0)
Hemoglobin: 13.3 g/dL (ref 13.0–17.0)
Immature Granulocytes: 0 %
Lymphocytes Relative: 40 %
Lymphs Abs: 2.1 10*3/uL (ref 0.7–4.0)
MCH: 31.3 pg (ref 26.0–34.0)
MCHC: 33.9 g/dL (ref 30.0–36.0)
MCV: 92.2 fL (ref 80.0–100.0)
Monocytes Absolute: 0.7 10*3/uL (ref 0.1–1.0)
Monocytes Relative: 13 %
Neutro Abs: 2.1 10*3/uL (ref 1.7–7.7)
Neutrophils Relative %: 41 %
Platelet Count: 178 10*3/uL (ref 150–400)
RBC: 4.25 MIL/uL (ref 4.22–5.81)
RDW: 14.5 % (ref 11.5–15.5)
WBC Count: 5.2 10*3/uL (ref 4.0–10.5)
nRBC: 0 % (ref 0.0–0.2)

## 2020-10-11 NOTE — Progress Notes (Signed)
[place] OFFICE PROGRESS NOTE   Diagnosis: Gastric cancer  INTERVAL HISTORY:   James [name] returns for a scheduled visit.  He feels well.  Good appetite and energy level.  He plays golf and tennis.  He has intermittent dysphagia and early satiety when he eats too fast or a large meal.  He plans to contact Dr. Carlean Purl to discuss a repeat esophageal dilation.  He has received a COVID-19 booster vaccine.  Objective:  Vital signs in last 24 hours:  Blood pressure (!) 141/89, pulse 73, temperature 97.8 F (36.6 C), temperature source Tympanic, resp. rate 18, height 5\' 6"  (1.676 m), weight 152 lb 11.2 oz (69.3 kg), SpO2 91 %.    Lymphatics: No cervical, supraclavicular, or inguinal nodes.  "Shotty "bilateral axillary nodes Resp: Lungs clear bilaterally Cardio: Regular rate and rhythm GI: No hepatosplenomegaly, no mass, mobile 1 cm subcutaneous nodular lesion at the lower aspect of the midline scar with associated tenderness Vascular: No leg edema     Lab Results:  Lab Results  Component Value Date   WBC 5.2 10/11/2020   HGB 13.3 10/11/2020   HCT 39.2 10/11/2020   MCV 92.2 10/11/2020   PLT 178 10/11/2020   NEUTROABS 2.1 10/11/2020    CMP  Lab Results  Component Value Date   NA 142 04/18/2020   K 4.3 04/18/2020   CL 106 04/18/2020   CO2 28 04/18/2020   GLUCOSE 91 04/18/2020   BUN 14 04/18/2020   CREATININE 0.90 04/18/2020   CALCIUM 8.8 04/18/2020   PROT 6.4 02/02/2020   ALBUMIN 4.1 02/02/2020   AST 25 02/02/2020   ALT 10 02/02/2020   ALKPHOS 58 02/02/2020   BILITOT 0.6 02/02/2020   GFRNONAA 83 04/18/2020   GFRAA 96 04/18/2020    Medications: I have reviewed the patient's current medications.   Assessment/Plan: 1. Gastric cancer-adenocarcinoma of the gastric fundus/cardia ? Staging CT scans 06/07/2015 with no evidence of distant metastatic disease, tiny nonspecific pulmonary nodules and liver lesions-likely benign ? Subtotal gastrectomy and  placement of a jejunostomy feeding tube 07/18/2015 for a T2,N0 tumor with negative surgical margins ? Initiation of adjuvant 5-FU/leucovorin 08/28/2015, status post 3 weekly treatments completed 09/13/2015 ? Initiation of radiation and concurrent Xeloda 10/29/2015 ? 11/26/2015 Xeloda dose reduced to 1000 mg twice daily due to progressive thrombocytopenia ? Radiation completed 12/06/2015 ? 5-FU/leucovorin resumed 12/21/2015   2. 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  Upper endoscopy 10/02/2016 with findings of multiple small bleeding angioectasiasstatus post argon plasma coagulation on 10/10/2016  3. Vitamin B-12 deficiency-Low-level confirmed 01/04/2016 and again 07/09/2018, maintained on vitamin B12 injection therapy  4. 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 Carlean Purl 11/01/2015 and 12/10/2015; EGD 10/02/2016 with finding of esophageal stenosis at the gastroesophageal junction status post dilation 10/10/2016, 01/21/2018, 06/20/2019  7. Mild neutropenia/thrombocytopenia secondary to chemotherapy. Progressive thrombocytopenia 11/26/2015. Xeloda dose reduced to 1000 mg twice daily. Counts stable to improved 01/04/2016.   8.Erythema/tenderness at the jejunostomy feeding tube site 09/20/2015, improved  9. Feeding tube removed 01/04/2016  10. Melanoma left lower abdominal wall status post wide excision.    Disposition: James [name] is in remission from gastric cancer.  He has a good prognosis for a  long-term disease-free survival.  He would like to continue follow-up  at the Cancer center.  He will return for an office visit in 9 months.  He continues on vitamin B12 replacement.  The tender nodular lesion at the upper abdominal scar is likely a benign cyst or suture granuloma.  He reports this area has been present for years.  He will contact Dr. Donell Beers for increased pain or enlargement of this lesion. [name], MD  10/11/2020  9:01 AM

## 2020-10-11 NOTE — Telephone Encounter (Signed)
Scheduled appointments per 1/6 los. Spoke to patient who is aware of appointments date and times.

## 2020-10-17 DIAGNOSIS — Z85820 Personal history of malignant melanoma of skin: Secondary | ICD-10-CM | POA: Diagnosis not present

## 2020-10-17 DIAGNOSIS — D1801 Hemangioma of skin and subcutaneous tissue: Secondary | ICD-10-CM | POA: Diagnosis not present

## 2020-10-17 DIAGNOSIS — D2262 Melanocytic nevi of left upper limb, including shoulder: Secondary | ICD-10-CM | POA: Diagnosis not present

## 2020-10-17 DIAGNOSIS — Z85828 Personal history of other malignant neoplasm of skin: Secondary | ICD-10-CM | POA: Diagnosis not present

## 2020-10-17 DIAGNOSIS — D485 Neoplasm of uncertain behavior of skin: Secondary | ICD-10-CM | POA: Diagnosis not present

## 2020-10-17 DIAGNOSIS — L720 Epidermal cyst: Secondary | ICD-10-CM | POA: Diagnosis not present

## 2020-10-17 DIAGNOSIS — L570 Actinic keratosis: Secondary | ICD-10-CM | POA: Diagnosis not present

## 2020-10-17 DIAGNOSIS — L814 Other melanin hyperpigmentation: Secondary | ICD-10-CM | POA: Diagnosis not present

## 2020-10-17 DIAGNOSIS — C44519 Basal cell carcinoma of skin of other part of trunk: Secondary | ICD-10-CM | POA: Diagnosis not present

## 2020-10-17 DIAGNOSIS — D692 Other nonthrombocytopenic purpura: Secondary | ICD-10-CM | POA: Diagnosis not present

## 2020-10-17 DIAGNOSIS — L821 Other seborrheic keratosis: Secondary | ICD-10-CM | POA: Diagnosis not present

## 2020-10-31 ENCOUNTER — Other Ambulatory Visit: Payer: Self-pay

## 2020-11-01 ENCOUNTER — Ambulatory Visit (INDEPENDENT_AMBULATORY_CARE_PROVIDER_SITE_OTHER): Payer: Medicare Other

## 2020-11-01 ENCOUNTER — Other Ambulatory Visit: Payer: Self-pay

## 2020-11-01 DIAGNOSIS — E538 Deficiency of other specified B group vitamins: Secondary | ICD-10-CM | POA: Diagnosis not present

## 2020-11-01 NOTE — Progress Notes (Signed)
Pt here for monthly B12 injection per  Dr Quay Burow.  B12 1039mcg given IM left deltoid and pt tolerated injection well.  Next B12 injection scheduled for 11/30/20.

## 2020-11-07 DIAGNOSIS — C44519 Basal cell carcinoma of skin of other part of trunk: Secondary | ICD-10-CM | POA: Diagnosis not present

## 2020-11-20 DIAGNOSIS — Z961 Presence of intraocular lens: Secondary | ICD-10-CM | POA: Diagnosis not present

## 2020-11-29 ENCOUNTER — Other Ambulatory Visit: Payer: Self-pay

## 2020-11-30 ENCOUNTER — Other Ambulatory Visit: Payer: Self-pay

## 2020-11-30 ENCOUNTER — Ambulatory Visit (INDEPENDENT_AMBULATORY_CARE_PROVIDER_SITE_OTHER): Payer: Medicare Other | Admitting: *Deleted

## 2020-11-30 DIAGNOSIS — E538 Deficiency of other specified B group vitamins: Secondary | ICD-10-CM | POA: Diagnosis not present

## 2020-11-30 MED ORDER — CYANOCOBALAMIN 1000 MCG/ML IJ SOLN
1000.0000 ug | Freq: Once | INTRAMUSCULAR | Status: AC
  Administered 2020-11-30: 1000 ug via INTRAMUSCULAR

## 2020-11-30 NOTE — Progress Notes (Signed)
Pls cosign for B12 inj../lmb  

## 2020-12-28 ENCOUNTER — Other Ambulatory Visit: Payer: Self-pay

## 2020-12-28 ENCOUNTER — Ambulatory Visit (INDEPENDENT_AMBULATORY_CARE_PROVIDER_SITE_OTHER): Payer: Medicare Other

## 2020-12-28 DIAGNOSIS — E538 Deficiency of other specified B group vitamins: Secondary | ICD-10-CM | POA: Diagnosis not present

## 2020-12-28 NOTE — Progress Notes (Signed)
B12 given Please cosign 

## 2021-01-23 ENCOUNTER — Ambulatory Visit: Payer: Medicare Other | Admitting: Internal Medicine

## 2021-01-25 ENCOUNTER — Other Ambulatory Visit: Payer: Self-pay

## 2021-01-28 ENCOUNTER — Ambulatory Visit: Payer: Medicare Other

## 2021-01-28 ENCOUNTER — Ambulatory Visit (INDEPENDENT_AMBULATORY_CARE_PROVIDER_SITE_OTHER): Payer: Medicare Other

## 2021-01-28 ENCOUNTER — Other Ambulatory Visit: Payer: Self-pay

## 2021-01-28 DIAGNOSIS — E538 Deficiency of other specified B group vitamins: Secondary | ICD-10-CM | POA: Diagnosis not present

## 2021-01-28 NOTE — Progress Notes (Signed)
Pt here for monthly B12 injection per Dr. Quay Burow  B12 1014mcg given left deltoid IM, and pt tolerated injection well.  Next B12 injection scheduled for 02/28/21

## 2021-02-05 ENCOUNTER — Ambulatory Visit (INDEPENDENT_AMBULATORY_CARE_PROVIDER_SITE_OTHER): Payer: Medicare Other | Admitting: Internal Medicine

## 2021-02-05 ENCOUNTER — Other Ambulatory Visit: Payer: Self-pay

## 2021-02-05 ENCOUNTER — Encounter: Payer: Self-pay | Admitting: Internal Medicine

## 2021-02-05 VITALS — BP 118/66 | HR 55 | Ht 66.0 in | Wt 152.0 lb

## 2021-02-05 DIAGNOSIS — K929 Disease of digestive system, unspecified: Secondary | ICD-10-CM

## 2021-02-05 DIAGNOSIS — K3189 Other diseases of stomach and duodenum: Secondary | ICD-10-CM

## 2021-02-05 DIAGNOSIS — R1319 Other dysphagia: Secondary | ICD-10-CM

## 2021-02-05 DIAGNOSIS — R1013 Epigastric pain: Secondary | ICD-10-CM | POA: Diagnosis not present

## 2021-02-05 NOTE — Patient Instructions (Signed)
If you are age 76 or older, your body mass index should be between 23-30. Your Body mass index is 24.53 kg/m. If this is out of the aforementioned range listed, please consider follow up with your Primary Care Provider.  You have been scheduled for an endoscopy. Please follow written instructions given to you at your visit today. If you use inhalers (even only as needed), please bring them with you on the day of your procedure.  Follow up pending the results of your Endoscopy.

## 2021-02-05 NOTE — Progress Notes (Signed)
James [name] 75 y.o. 1945-12-18 [id number]  Assessment & Plan:   Encounter Diagnoses  Name Primary?  . Esophageal dysphagia Yes  . Anastomotic stricture of stomach   . Abdominal pain, epigastric     Sound like his stricture is symptomatic again.  We will schedule EGD with dilation for the [place] [facility]. The risks and benefits as well as alternatives of endoscopic procedure(s) have been discussed and reviewed. All questions answered. The patient agrees to proceed.  I appreciate the opportunity to care for this patient. 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  Over the past several months or longer he has had increasing dysphagia and feels like he needs another dilation.  He eats 6 meals a day to keep his weight up.  He is regurgitating at times and sometimes he has epigastric discomfort after eating.  This is all similar to what has been through in the past.  Otherwise he remains very active he golfs and plays tennis most days he has a second home condominium up at [facility] and is enjoying that.  He has a daughter that is getting married soon. Allergies  Allergen Reactions  . Codeine Nausea And Vomiting   Current Meds  Medication Sig  . acetaminophen (TYLENOL) 500 MG tablet Take 1,000 mg by mouth every 6 (six) hours as needed for mild pain.   . Cholecalciferol (D3-1000 PO) Take by mouth daily.  . Multiple Vitamin (MULTIVITAMIN WITH MINERALS) TABS tablet Take 1 tablet by mouth every other day.   [name] omeprazole (PRILOSEC) 40 MG capsule TAKE 1 CAPSULE DAILY 30 MINS BEFORE BREAKFAST OR SUPPER, OKAY TO OPEN AND SPRINKLE ON APPLESAUCE  . pravastatin (PRAVACHOL) 40 MG tablet  Take 1 tablet (40 mg total) by mouth at bedtime.  . vitamin B-12 (CYANOCOBALAMIN) 1000 MCG tablet 2,000 mcg daily.    Current Facility-Administered Medications for the 02/05/21 encounter (Office Visit) with Gatha Mayer, MD  Medication  . cyanocobalamin ((VITAMIN B-12)) injection 1,000 mcg   Past Medical History:  Diagnosis Date  . Abnormal EKG    NS ST-T EKG Changes, (-) Nuclear Stress Test 03/2006  . Allergy   . Anemia    iron deficency  . Arthritis   . B12 deficiency 06/05/2015   B12 Low NL at 271 methtylmalonic acid high  . Cardiac murmur    nothing to be concerned with   . Cataract    extractions  . Duodenal ulcer 1972   transfusion required, 1 unit packed cells  . Dysphagia    history throat/stomach  cancer "can swallow some, uses more pureed type foods"  . Dyspnea    at times  . Esophageal reflux    no currlently  . Gastric cancer ([place]) 07/18/15   invasive adenocarcinoma  . Gastrostomy tube in place [facility]) 2016   intermittent feeding  . Hx of basal cell carcinoma   . Hyperlipemia    no per pt 04-11-16  . Melanoma ([place])   . Prostate cancer ([place]) 09/06/14   last radiation 4'16   . S/P radiation therapy 11/29/2014 through 01/23/2015   Prostate 7800 cGy in 40 sessions, seminal vesicles 5600 cGy in 40 sessions   . Skin cancer, basal cell   .  Transfusion history    '72 -s/p surgery for duodenal ulcer, 12/17   Past Surgical History:  Procedure Laterality Date  . BALLOON DILATION N/A 09/12/2015   Procedure: BALLOON DILATION;  Surgeon: Gatha Mayer, MD;  [facility];  Service: Endoscopy;  Laterality: N/A;  . BALLOON DILATION N/A 09/21/2015   Procedure: BALLOON DILATION;  Surgeon: Gatha Mayer, MD;  [facility];  Service: Endoscopy;  Laterality: N/A;  . BALLOON DILATION N/A 10/04/2015   Procedure: BALLOON DILATION;  Surgeon: Milus Banister, MD;  [facility];   Service: Endoscopy;  Laterality: N/A;  . BALLOON DILATION N/A 12/10/2015   Procedure: BALLOON DILATION;  Surgeon: Gatha Mayer, MD;  [facility];  Service: Endoscopy;  Laterality: N/A;  . BALLOON DILATION N/A 10/10/2016   Procedure: BALLOON DILATION;  Surgeon: Gatha Mayer, MD;  [facility];  Service: Endoscopy;  Laterality: N/A;  . CATARACT EXTRACTION, BILATERAL Bilateral   . COLONOSCOPY    . colonoscopy with polypectomy  02/2012   Dr Sharlett Iles  . ESOPHAGOGASTRODUODENOSCOPY (EGD) WITH PROPOFOL N/A 08/17/2015   Procedure: ESOPHAGOGASTRODUODENOSCOPY (EGD) WITH PROPOFOL;  Surgeon: Gatha Mayer, MD;  Location: [place];  Service: Endoscopy;  Laterality: N/A;  . ESOPHAGOGASTRODUODENOSCOPY (EGD) WITH PROPOFOL N/A 09/12/2015   Procedure: ESOPHAGOGASTRODUODENOSCOPY (EGD) WITH PROPOFOL;  Surgeon: Gatha Mayer, MD;  [facility];  Service: Endoscopy;  Laterality: N/A;  . ESOPHAGOGASTRODUODENOSCOPY (EGD) WITH PROPOFOL N/A 09/21/2015   Procedure: ESOPHAGOGASTRODUODENOSCOPY (EGD) WITH PROPOFOL;  Surgeon: Gatha Mayer, MD;  [facility];  Service: Endoscopy;  Laterality: N/A;  . ESOPHAGOGASTRODUODENOSCOPY (EGD) WITH PROPOFOL N/A 10/04/2015   Procedure: ESOPHAGOGASTRODUODENOSCOPY (EGD) WITH PROPOFOL;  Surgeon: Milus Banister, MD;  [facility];  Service: Endoscopy;  Laterality: N/A;  possible stricture  . ESOPHAGOGASTRODUODENOSCOPY (EGD) WITH PROPOFOL N/A 12/10/2015   Procedure: ESOPHAGOGASTRODUODENOSCOPY (EGD) WITH PROPOFOL;  Surgeon: Gatha Mayer, MD;  [facility];  Service: Endoscopy;  Laterality: N/A;  . ESOPHAGOGASTRODUODENOSCOPY (EGD) WITH PROPOFOL N/A 02/06/2016   Procedure: ESOPHAGOGASTRODUODENOSCOPY (EGD) WITH PROPOFOL;  Surgeon: Gatha Mayer, MD;  [facility];  Service: Endoscopy;  Laterality: N/A;  . ESOPHAGOGASTRODUODENOSCOPY (EGD) WITH PROPOFOL N/A 10/10/2016   Procedure: ESOPHAGOGASTRODUODENOSCOPY (EGD) WITH  PROPOFOL;  Surgeon: Gatha Mayer, MD;  [facility];  Service: Endoscopy;  Laterality: N/A;  needs APC   . EUS N/A 06/14/2015   Procedure: ESOPHAGEAL ENDOSCOPIC ULTRASOUND (EUS) RADIAL;  Surgeon: Milus Banister, MD;  [facility];  Service: Endoscopy;  Laterality: N/A;  . HOT HEMOSTASIS N/A 10/10/2016   Procedure: HOT HEMOSTASIS (ARGON PLASMA COAGULATION/BICAP);  Surgeon: Gatha Mayer, MD;  [facility];  Service: Endoscopy;  Laterality: N/A;  . KNEE ARTHROSCOPY Right    Right Knee, [facility]  . LAPAROSCOPIC GASTRECTOMY N/A 07/18/2015   Procedure: SUB TOTAL GASTRECTOMY;  Surgeon: Stark Klein, MD;  [facility];  Service: General;  Laterality: N/A;  . LAPAROSCOPIC GASTROSTOMY N/A 07/18/2015   Procedure: FEEDING TUBE PLACEMENT;  Surgeon: Stark Klein, MD;  [facility];  Service: General;  Laterality: N/A;  . LAPAROSCOPY N/A 07/18/2015   Procedure: LAPAROSCOPY DIAGNOSTIC;  Surgeon: Stark Klein, MD;  [facility];  Service: General;  Laterality: N/A;  . MELANOMA EXCISION  02-01-16   abdominal wall  . MOHS SURGERY  05/2020   nose  . PROSTATE BIOPSY  09/06/14  . ROTATOR CUFF REPAIR Right    R shoulder, [place]  . SAVORY DILATION N/A 02/06/2016  Procedure: SAVORY DILATION;  Surgeon: Gatha Mayer, MD;  [facility];  Service: Endoscopy;  Laterality: N/A;  . TOTAL SHOULDER ARTHROPLASTY Left 05/07/2017   Procedure: LEFT REVERSE TOTAL SHOULDER ARTHROPLASTY;  Surgeon: Justice Britain, MD;  Location: [place];  Service: Orthopedics;  Laterality: Left;  . TRIGGER FINGER RELEASE Left 1998  . UPPER GASTROINTESTINAL ENDOSCOPY     x14  . 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   Review of Systems   Objective:   Physical Exam BP 118/66   Pulse (!) 55   Ht _0  (1.676 m)   Wt 152 lb (68.9 kg)   SpO2 98%   BMI 24.53 kg/m

## 2021-02-15 ENCOUNTER — Other Ambulatory Visit: Payer: Self-pay

## 2021-02-15 ENCOUNTER — Ambulatory Visit (INDEPENDENT_AMBULATORY_CARE_PROVIDER_SITE_OTHER): Payer: Medicare Other

## 2021-02-15 ENCOUNTER — Ambulatory Visit (INDEPENDENT_AMBULATORY_CARE_PROVIDER_SITE_OTHER): Payer: Medicare Other | Admitting: Internal Medicine

## 2021-02-15 ENCOUNTER — Encounter: Payer: Self-pay | Admitting: Internal Medicine

## 2021-02-15 VITALS — BP 112/80 | HR 49 | Temp 99.1°F | Ht 66.0 in | Wt 151.6 lb

## 2021-02-15 DIAGNOSIS — M545 Low back pain, unspecified: Secondary | ICD-10-CM | POA: Diagnosis not present

## 2021-02-15 DIAGNOSIS — M5441 Lumbago with sciatica, right side: Secondary | ICD-10-CM

## 2021-02-15 MED ORDER — GABAPENTIN 100 MG PO CAPS: 100.0000 mg | ORAL_CAPSULE | Freq: Every day | ORAL | 3 refills | Status: DC

## 2021-02-15 MED ORDER — TRAMADOL HCL 50 MG PO TABS: 50.0000 mg | ORAL_TABLET | Freq: Three times a day (TID) | ORAL | 0 refills | Status: AC | PRN

## 2021-02-15 MED ORDER — METHYLPREDNISOLONE ACETATE 80 MG/ML IJ SUSP
80.0000 mg | Freq: Once | INTRAMUSCULAR | Status: AC
  Administered 2021-02-15: 80 mg via INTRAMUSCULAR

## 2021-02-15 NOTE — Patient Instructions (Addendum)
You received a steroid injection today.  Have an xray downstairs.    Take tylenol as needed. Max dose is 3000 mg a day.   Try ice/heat.    Take tramadol as needed for pain ( pain medication).   Take gabapentin 100-300 mg at bedtime ( nerve pain medication)   A referral was ordered for [facility].

## 2021-02-15 NOTE — Assessment & Plan Note (Signed)
Acute Symptoms consistent with sciatica He has had this in the past, but it has been years No alarm symptoms Depo-Medrol 80 mg IM x1 Continue Tylenol daily-up to 3000 mg in 1 day Tramadol 50 mg 3 times daily as needed Gabapentin 100-300 mg nightly-advised him to start at 100 mg and increase slowly.  Discussed possible side effects Refer to orthopedics in case that he needs an injection, which was required after his last episode Will obtain an x-ray of his lower back today He will update me via MyChart if he is not improving so that we can adjust his medications

## 2021-02-15 NOTE — Progress Notes (Signed)
Subjective:    Patient ID: James [name], male    DOB: Aug 17, 1946, 75 y.o.   MRN: [id number]  HPI The patient is here for an acute visit.   Hurt back -  He played golf Mon and played tennis Tuesday.  Tuesday night his back started hurting and got worse.  He did have to pick up some cases of wine and move them and that could have been what caused the pain.  Typically golf and tennis does not cause any issues.  The pain starts in the right lower back and radiates down the right leg.  He had this years ago.    He took Doans and ibuprofen.  He cannot take a lot of ibuprofen because of his stomach.  The Doans did seem to help some.  At rest he has no pain.  Any movement causes pain.  Medications and allergies reviewed with patient and updated if appropriate.  Patient Active Problem List   Diagnosis Date Noted  . 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 Fear associated with healthcare 05/15/2020  . Bilateral carotid bruits 04/18/2020  . Ataxia 04/18/2020  . 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 Chronic fatigue 04/18/2020  . GERD (gastroesophageal reflux disease) 02/02/2020  . Left breast lump 07/15/2018  . Hyperglycemia 07/07/2018  . Nonintractable headache 07/08/2017  . 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 Gastric AVM   . Thrombocytopenia ([place]) 05/24/2016  . Malignant melanoma ([place]) 01/08/2016  . Anastomotic stricture of stomach   . Esophageal stricture   . Carcinoma of lesser curvature of stomach ([place]) 07/18/2015  . Gastric cancer ([place]) 06/08/2015  . B12 deficiency 06/05/2015  . Prostate cancer ([place]) 10/24/2014  . Vitamin D deficiency 06/03/2014  . Basal cell cancer 09/17/2012  . HEARING DEFICIT 12/21/2009  . NONSPECIFIC ABNORMAL ELECTROCARDIOGRAM 12/21/2009  . History of colonic polyps 12/04/2008  . HYPERLIPIDEMIA 08/23/2007    Current Outpatient Medications on File Prior to Visit  Medication Sig Dispense  Refill  . acetaminophen (TYLENOL) 500 MG tablet Take 1,000 mg by mouth every 6 (six) hours as needed for mild pain.     . Cholecalciferol (D3-1000 PO) Take by mouth daily.    . Multiple Vitamin (MULTIVITAMIN WITH MINERALS) TABS tablet Take 1 tablet by mouth every other day.     [name] omeprazole (PRILOSEC) 40 MG capsule TAKE 1 CAPSULE DAILY 30 MINS BEFORE BREAKFAST OR SUPPER, OKAY TO OPEN AND SPRINKLE ON APPLESAUCE 90 capsule 1  . pravastatin (PRAVACHOL) 40 MG tablet Take 1 tablet (40 mg total) by mouth at bedtime. 90 tablet 3  . vitamin B-12 (CYANOCOBALAMIN) 1000 MCG tablet 2,000 mcg daily.      Current Facility-Administered Medications on File Prior to Visit  Medication Dose Route Frequency Provider Last Rate Last Admin  . cyanocobalamin ((VITAMIN B-12)) injection 1,000 mcg  1,000 mcg Intramuscular Q30 days Janith Lima, MD   1,000 mcg at 01/28/21 1608  . sodium chloride flush (NS) 0.9 % injection 10 mL  10 mL Intravenous PRN Penumalli, Earlean Polka, MD   4 mL at 06/21/20 0935    Past Medical History:  Diagnosis Date  . Abnormal EKG    NS ST-T EKG Changes, (-) Nuclear Stress Test 03/2006  . Allergy   . Anemia    iron deficency  . Arthritis   . B12 deficiency 06/05/2015   B12 Low NL at 271 methtylmalonic acid high  . Cardiac murmur  nothing to be concerned with   . Cataract    extractions  . Duodenal ulcer 1972   transfusion required, 1 unit packed cells  . Dysphagia    history throat/stomach  cancer "can swallow some, uses more pureed type foods"  . Dyspnea    at times  . Esophageal reflux    no currlently  . Gastric cancer ([place]) 07/18/15   invasive adenocarcinoma  . Gastrostomy tube in place [facility]) 2016   intermittent feeding  . Hx of basal cell carcinoma   . Hyperlipemia    no per pt 04-11-16  . Melanoma ([place])   . Prostate cancer ([place]) 09/06/14   last radiation 4'16   . S/P radiation therapy 11/29/2014 through 01/23/2015    Prostate 7800 cGy in 40 sessions, seminal vesicles 5600 cGy in 40 sessions   . Skin cancer, basal cell   . Transfusion history    '72 -s/p surgery for duodenal ulcer, 12/17    Past Surgical History:  Procedure Laterality Date  . BALLOON DILATION N/A 09/12/2015   Procedure: BALLOON DILATION;  Surgeon: Gatha Mayer, MD;  [facility];  Service: Endoscopy;  Laterality: N/A;  . BALLOON DILATION N/A 09/21/2015   Procedure: BALLOON DILATION;  Surgeon: Gatha Mayer, MD;  [facility];  Service: Endoscopy;  Laterality: N/A;  . BALLOON DILATION N/A 10/04/2015   Procedure: BALLOON DILATION;  Surgeon: Milus Banister, MD;  [facility];  Service: Endoscopy;  Laterality: N/A;  . BALLOON DILATION N/A 12/10/2015   Procedure: BALLOON DILATION;  Surgeon: Gatha Mayer, MD;  [facility];  Service: Endoscopy;  Laterality: N/A;  . BALLOON DILATION N/A 10/10/2016   Procedure: BALLOON DILATION;  Surgeon: Gatha Mayer, MD;  [facility];  Service: Endoscopy;  Laterality: N/A;  . CATARACT EXTRACTION, BILATERAL Bilateral   . COLONOSCOPY    . colonoscopy with polypectomy  02/2012   Dr Sharlett Iles  . ESOPHAGOGASTRODUODENOSCOPY (EGD) WITH PROPOFOL N/A 08/17/2015   Procedure: ESOPHAGOGASTRODUODENOSCOPY (EGD) WITH PROPOFOL;  Surgeon: Gatha Mayer, MD;  Location: [place];  Service: Endoscopy;  Laterality: N/A;  . ESOPHAGOGASTRODUODENOSCOPY (EGD) WITH PROPOFOL N/A 09/12/2015   Procedure: ESOPHAGOGASTRODUODENOSCOPY (EGD) WITH PROPOFOL;  Surgeon: Gatha Mayer, MD;  [facility];  Service: Endoscopy;  Laterality: N/A;  . ESOPHAGOGASTRODUODENOSCOPY (EGD) WITH PROPOFOL N/A 09/21/2015   Procedure: ESOPHAGOGASTRODUODENOSCOPY (EGD) WITH PROPOFOL;  Surgeon: Gatha Mayer, MD;  [facility];  Service: Endoscopy;  Laterality: N/A;  . ESOPHAGOGASTRODUODENOSCOPY (EGD) WITH PROPOFOL N/A 10/04/2015   Procedure:  ESOPHAGOGASTRODUODENOSCOPY (EGD) WITH PROPOFOL;  Surgeon: Milus Banister, MD;  [facility];  Service: Endoscopy;  Laterality: N/A;  possible stricture  . ESOPHAGOGASTRODUODENOSCOPY (EGD) WITH PROPOFOL N/A 12/10/2015   Procedure: ESOPHAGOGASTRODUODENOSCOPY (EGD) WITH PROPOFOL;  Surgeon: Gatha Mayer, MD;  [facility];  Service: Endoscopy;  Laterality: N/A;  . ESOPHAGOGASTRODUODENOSCOPY (EGD) WITH PROPOFOL N/A 02/06/2016   Procedure: ESOPHAGOGASTRODUODENOSCOPY (EGD) WITH PROPOFOL;  Surgeon: Gatha Mayer, MD;  [facility];  Service: Endoscopy;  Laterality: N/A;  . ESOPHAGOGASTRODUODENOSCOPY (EGD) WITH PROPOFOL N/A 10/10/2016   Procedure: ESOPHAGOGASTRODUODENOSCOPY (EGD) WITH PROPOFOL;  Surgeon: Gatha Mayer, MD;  [facility];  Service: Endoscopy;  Laterality: N/A;  needs APC   . EUS N/A 06/14/2015   Procedure: ESOPHAGEAL ENDOSCOPIC ULTRASOUND (EUS) RADIAL;  Surgeon: Milus Banister, MD;  [facility];  Service: Endoscopy;  Laterality: N/A;  . HOT HEMOSTASIS N/A 10/10/2016   Procedure: HOT HEMOSTASIS (ARGON PLASMA COAGULATION/BICAP);  Surgeon: Gatha Mayer, MD;  [facility];  Service: Endoscopy;  Laterality: N/A;  . KNEE ARTHROSCOPY Right    Right Knee, [facility]  . LAPAROSCOPIC GASTRECTOMY N/A 07/18/2015   Procedure: SUB TOTAL GASTRECTOMY;  Surgeon: Stark Klein, MD;  [facility];  Service: General;  Laterality: N/A;  . LAPAROSCOPIC GASTROSTOMY N/A 07/18/2015   Procedure: FEEDING TUBE PLACEMENT;  Surgeon: Stark Klein, MD;  [facility];  Service: General;  Laterality: N/A;  . LAPAROSCOPY N/A 07/18/2015   Procedure: LAPAROSCOPY DIAGNOSTIC;  Surgeon: Stark Klein, MD;  [facility];  Service: General;  Laterality: N/A;  . MELANOMA EXCISION  02-01-16   abdominal wall  . MOHS SURGERY  05/2020   nose  . PROSTATE BIOPSY  09/06/14  . ROTATOR CUFF REPAIR Right    R shoulder, [place]  . SAVORY DILATION N/A 02/06/2016    Procedure: SAVORY DILATION;  Surgeon: Gatha Mayer, MD;  [facility];  Service: Endoscopy;  Laterality: N/A;  . TOTAL SHOULDER ARTHROPLASTY Left 05/07/2017   Procedure: LEFT REVERSE TOTAL SHOULDER ARTHROPLASTY;  Surgeon: Justice Britain, MD;  Location: [place];  Service: Orthopedics;  Laterality: Left;  . TRIGGER FINGER RELEASE Left 1998  . UPPER GASTROINTESTINAL ENDOSCOPY     x14  . VASECTOMY      Social History   Socioeconomic History  . Marital status: Married    Spouse name: Tye [place]  . Number of children: 2  . Years of education: Not on file  . Highest education level: Not on file  Occupational History  . Occupation: retired  Tobacco Use  . Smoking status: Former Smoker    Packs/day: 0.50    Years: 5.00    Pack years: 2.50    Types: Cigarettes    Quit date: 10/07/1967    Years since quitting: 53.3  . Smokeless tobacco: Never Used  . Tobacco comment: smoked 1963- 1969 , up to < 1 ppd  Vaping Use  . Vaping Use: Never used  Substance and Sexual Activity  . Alcohol use: Yes    Comment: maybe once a week  . Drug use: No  . Sexual activity: Yes  Other Topics Concern  . 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 Diabetes Father   . Heart attack Father 71  . Liver disease Father        Related to Alcohol Use   . Alcohol abuse Mother   . Cirrhosis Mother        died in her 106s  . Multiple myeloma Brother   . Diabetes Sister   . Colon cancer Neg Hx   . Stomach cancer Neg Hx   . Stroke Neg  Hx   . Esophageal cancer Neg Hx   . Rectal cancer Neg Hx     Review of Systems  Constitutional: Negative for fever.  Gastrointestinal:       No incontinence  Genitourinary:  No incontinence  Musculoskeletal: Positive for back pain.  Neurological: Positive for numbness (in right leg). Negative for weakness.       Objective:   Vitals:   02/15/21 1404  BP: 112/80  Pulse: (!) 49  Temp: 99.1 F (37.3 C)  SpO2: 96%   BP Readings from Last 3 Encounters:  02/15/21 112/80  02/05/21 118/66  10/11/20 (!) 141/89   Wt Readings from Last 3 Encounters:  02/15/21 151 lb 9.6 oz (68.8 kg)  02/05/21 152 lb (68.9 kg)  10/11/20 152 lb 11.2 oz (69.3 kg)   Body mass index is 24.47 kg/m.   Physical Exam Constitutional:      General: He is not in acute distress.    Appearance: Normal appearance. He is not ill-appearing.  HENT:     Head: Normocephalic and atraumatic.  Musculoskeletal:     Right lower leg: No edema.     Left lower leg: No edema.  Skin:    General: Skin is warm and dry.  Neurological:     Mental Status: He is alert.     Sensory: No sensory deficit.     Motor: No weakness.     Coordination: Coordination normal.     Gait: Gait abnormal (Slight secondary to pain).     Comments: Positive straight leg raise on right            Assessment & Plan:    See Problem List for Assessment and Plan of chronic medical problems.    This visit occurred during the SARS-CoV-2 public health emergency.  Safety protocols were in place, including screening questions prior to the visit, additional usage of staff PPE, and extensive cleaning of exam room while observing appropriate contact time as indicated for disinfecting solutions.

## 2021-02-18 DIAGNOSIS — M5459 Other low back pain: Secondary | ICD-10-CM | POA: Diagnosis not present

## 2021-02-19 ENCOUNTER — Other Ambulatory Visit: Payer: Self-pay | Admitting: Physician Assistant

## 2021-02-19 DIAGNOSIS — M5416 Radiculopathy, lumbar region: Secondary | ICD-10-CM

## 2021-02-22 ENCOUNTER — Other Ambulatory Visit: Payer: Self-pay | Admitting: Orthopedic Surgery

## 2021-02-22 DIAGNOSIS — M5416 Radiculopathy, lumbar region: Secondary | ICD-10-CM

## 2021-02-24 ENCOUNTER — Other Ambulatory Visit: Payer: Self-pay

## 2021-02-24 ENCOUNTER — Ambulatory Visit
Admission: RE | Admit: 2021-02-24 | Discharge: 2021-02-24 | Disposition: A | Discharge status: Home / Self Care | Payer: Medicare Other | Source: Ambulatory Visit | Attending: Physician Assistant | Admitting: Physician Assistant

## 2021-02-24 DIAGNOSIS — M48061 Spinal stenosis, lumbar region without neurogenic claudication: Secondary | ICD-10-CM | POA: Diagnosis not present

## 2021-02-24 DIAGNOSIS — M5416 Radiculopathy, lumbar region: Secondary | ICD-10-CM

## 2021-02-24 DIAGNOSIS — M545 Low back pain, unspecified: Secondary | ICD-10-CM | POA: Diagnosis not present

## 2021-02-26 ENCOUNTER — Other Ambulatory Visit: Payer: Self-pay

## 2021-02-26 ENCOUNTER — Ambulatory Visit (AMBULATORY_SURGERY_CENTER): Payer: Medicare Other | Admitting: Internal Medicine

## 2021-02-26 ENCOUNTER — Encounter: Payer: Self-pay | Admitting: Internal Medicine

## 2021-02-26 VITALS — BP 106/60 | HR 60 | Temp 97.7°F | Resp 13 | Ht 66.0 in | Wt 152.0 lb

## 2021-02-26 DIAGNOSIS — K222 Esophageal obstruction: Secondary | ICD-10-CM

## 2021-02-26 DIAGNOSIS — R1319 Other dysphagia: Secondary | ICD-10-CM | POA: Diagnosis not present

## 2021-02-26 DIAGNOSIS — R1310 Dysphagia, unspecified: Secondary | ICD-10-CM | POA: Diagnosis not present

## 2021-02-26 MED ORDER — SODIUM CHLORIDE 0.9 % IV SOLN: 500.0000 mL | Freq: Once | INTRAVENOUS | Status: DC

## 2021-02-26 NOTE — Progress Notes (Signed)
Pt's states no medical or surgical changes since previsit or office visit. 

## 2021-02-26 NOTE — Op Note (Signed)
[facility] Patient Name: James [name] Procedure Date: 02/26/2021 7:34 AM MRN: [id number] Endoscopist: Gatha Mayer , MD Age: 75 Referring MD:  Date of Birth: Jan 28, 1946 Gender: Male Account #: [id number] Procedure:                Upper GI endoscopy Indications:              Dysphagia, Stricture of the esophagus, For therapy                            of esophageal stricture Medicines:                Propofol per Anesthesia, Monitored Anesthesia Care Procedure:                Pre-Anesthesia Assessment:                           - Prior to the procedure, a History and Physical                            was performed, and patient medications and                            allergies were reviewed. The patient's tolerance of                            previous anesthesia was also reviewed. The risks                            and benefits of the procedure and the sedation                            options and risks were discussed with the patient.                            All questions were answered, and informed consent                            was obtained. Prior Anticoagulants: The patient has                            taken no previous anticoagulant or antiplatelet                            agents. ASA Grade Assessment: II - A patient with                            mild systemic disease. After reviewing the risks                            and benefits, the patient was deemed in                            satisfactory condition to undergo the procedure.  After obtaining informed consent, the endoscope was                            passed under direct vision. Throughout the                            procedure, the patient's blood pressure, pulse, and                            oxygen saturations were monitored continuously. The                            Endoscope was introduced through the mouth, and                            advanced to  the second part of duodenum. The upper                            GI endoscopy was accomplished without difficulty.                            The patient tolerated the procedure well. Scope In: Scope Out: Findings:                 One benign-appearing, intrinsic moderate                            (circumferential scarring or stenosis; an endoscope                            may pass) stenosis was found at the esophageal                            anastomosis. This stenosis measured 1.6 cm (inner                            diameter). The stenosis was traversed. A TTS                            dilator was passed through the scope. Dilation with                            a 16-17-18 mm balloon and an 18-19-20 mm balloon                            dilator was performed to 20 mm. The dilation site                            was examined and showed no change. Estimated blood                            loss: none.  Evidence of a proximal hemi-gastrectomy were found                            in the cardia, in the gastric fundus and in the                            gastric body. This was characterized by healthy                            appearing mucosa.                           The examined duodenum was normal.                           The cardia and gastric fundus were normal for                            post-operative status on retroflexion. Complications:            No immediate complications. Estimated Blood Loss:     Estimated blood loss: none. Impression:               - Benign-appearing esophageal stenosis. Dilated.                           - A proximal hemi-gastrectomy were found,                            characterized by healthy appearing mucosa.                           - Normal examined duodenum.                           - No specimens collected. Recommendation:           - Patient has a contact number available for                             emergencies. The signs and symptoms of potential                            delayed complications were discussed with the                            patient. Return to normal activities tomorrow.                            Written discharge instructions were provided to the                            patient.                           - Clear liquids x 1 hour then soft foods rest of  day. Start prior diet tomorrow.                           - Continue present medications.                           - message Dr. Carlean Purl in 1-2 weeks with symptom                            update Gatha Mayer, MD 02/26/2021 8:15:43 AM This report has been signed electronically.

## 2021-02-26 NOTE — Patient Instructions (Addendum)
I dilated to 20 mm - let us see what that does.  Please let me know with an update by My Chart in about a week or 2.   I appreciate the opportunity to care for you. Gatha Mayer, MD, FACG   YOU HAD AN ENDOSCOPIC PROCEDURE TODAY AT [facility]:   Refer to the procedure report that was given to you for any specific questions about what was found during the examination.  If the procedure report does not answer your questions, please call your gastroenterologist to clarify.  If you requested that your care partner not be given the details of your procedure findings, then the procedure report has been included in a sealed envelope for you to review at your convenience later.  YOU SHOULD EXPECT: Some feelings of bloating in the abdomen. Passage of more gas than usual.  Walking can help get rid of the air that was put into your GI tract during the procedure and reduce the bloating. If you had a lower endoscopy (such as a colonoscopy or flexible sigmoidoscopy) you may notice spotting of blood in your stool or on the toilet paper. If you underwent a bowel prep for your procedure, you may not have a normal bowel movement for a few days.  Please Note:  You might notice some irritation and congestion in your nose or some drainage.  This is from the oxygen used during your procedure.  There is no need for concern and it should clear up in a day or so.  SYMPTOMS TO REPORT IMMEDIATELY:    Following upper endoscopy (EGD)  Vomiting of blood or coffee ground material  New chest pain or pain under the shoulder blades  Painful or persistently difficult swallowing  New shortness of breath  Fever of 100F or higher  Black, tarry-looking stools  For urgent or emergent issues, a gastroenterologist can be reached at any hour by calling [phone]. Do not use MyChart messaging for urgent concerns.    DIET:  Nothing by mouth until 9:00, clear liquids until 10:00, soft foods for the rest  of today. Tomorrow you  may proceed to your regular diet.  Drink plenty of fluids but you should avoid alcoholic beverages for 24 hours.  ACTIVITY:  You should plan to take it easy for the rest of today and you should NOT DRIVE or use heavy machinery until tomorrow (because of the sedation medicines used during the test).    FOLLOW UP: Our staff will call the number listed on your records 48-72 hours following your procedure to check on you and address any questions or concerns that you may have regarding the information given to you following your procedure. If we do not reach you, we will leave a message.  We will attempt to reach you two times.  During this call, we will ask if you have developed any symptoms of COVID 19. If you develop any symptoms (ie: fever, flu-like symptoms, shortness of breath, cough etc.) before then, please call [phone].  If you test positive for Covid 19 in the 2 weeks post procedure, please call and report this information to [place].    If any biopsies were taken you will be contacted by phone or by letter within the next 1-3 weeks.  Please call us at [phone] if you have not heard about the biopsies in 3 weeks.    SIGNATURES/CONFIDENTIALITY: You and/or your care partner have signed paperwork which will be entered into your electronic  medical record.  These signatures attest to the fact that that the information above on your After Visit Summary has been reviewed and is understood.  Full responsibility of the confidentiality of this discharge information lies with you and/or your care-partner.

## 2021-02-26 NOTE — Progress Notes (Signed)
Called to room to assist during endoscopic procedure.  Patient ID and intended procedure confirmed with present staff. Received instructions for my participation in the procedure from the performing physician.  

## 2021-02-26 NOTE — Progress Notes (Signed)
To PACU, VSS. Report to [email or web address] 

## 2021-02-28 ENCOUNTER — Telehealth: Payer: Self-pay

## 2021-02-28 ENCOUNTER — Ambulatory Visit (INDEPENDENT_AMBULATORY_CARE_PROVIDER_SITE_OTHER): Payer: Medicare Other

## 2021-02-28 ENCOUNTER — Other Ambulatory Visit: Payer: Self-pay

## 2021-02-28 DIAGNOSIS — E538 Deficiency of other specified B group vitamins: Secondary | ICD-10-CM

## 2021-02-28 NOTE — Telephone Encounter (Signed)
  Follow up Call-  Call back number 02/26/2021 06/20/2019  Post procedure Call Back phone  # [phone]  Permission to leave phone message Yes Yes  Some recent data might be hidden     Patient questions:  Do you have a fever, pain , or abdominal swelling? No. Pain Score  0 *  Have you tolerated food without any problems? Yes.    Have you been able to return to your normal activities? Yes.    Do you have any questions about your discharge instructions: Diet   No. Medications  No. Follow up visit  No.  Do you have questions or concerns about your Care? No.  Actions: * If pain score is 4 or above: No action needed, pain <4.  1. Have you developed a fever since your procedure? no  2.   Have you had an respiratory symptoms (SOB or cough) since your procedure? no  3.   Have you tested positive for COVID 19 since your procedure no  4.   Have you had any family members/close contacts diagnosed with the COVID 19 since your procedure?  no   If yes to any of these questions please route to Joylene John, RN and Joella Prince, RN

## 2021-02-28 NOTE — Progress Notes (Signed)
Pt here for monthly B12 injection per Dr Quay Burow.   B12 1034mcg given IM Right deltoid and pt tolerated injection well.

## 2021-03-05 DIAGNOSIS — M5416 Radiculopathy, lumbar region: Secondary | ICD-10-CM | POA: Diagnosis not present

## 2021-03-06 ENCOUNTER — Other Ambulatory Visit: Payer: Self-pay | Admitting: Internal Medicine

## 2021-03-06 DIAGNOSIS — E782 Mixed hyperlipidemia: Secondary | ICD-10-CM

## 2021-03-12 DIAGNOSIS — Z23 Encounter for immunization: Secondary | ICD-10-CM | POA: Diagnosis not present

## 2021-03-18 ENCOUNTER — Encounter: Payer: Self-pay | Admitting: Physical Therapy

## 2021-03-18 ENCOUNTER — Other Ambulatory Visit: Payer: Self-pay

## 2021-03-18 ENCOUNTER — Ambulatory Visit: Payer: Medicare Other | Attending: Orthopedic Surgery | Admitting: Physical Therapy

## 2021-03-18 DIAGNOSIS — M62830 Muscle spasm of back: Secondary | ICD-10-CM | POA: Diagnosis not present

## 2021-03-18 DIAGNOSIS — M5441 Lumbago with sciatica, right side: Secondary | ICD-10-CM | POA: Diagnosis not present

## 2021-03-18 NOTE — Therapy (Signed)
[place]. [place], [place], [address] Phone: [phone]   Fax:  [phone]  Physical Therapy Evaluation  Patient Details  Name: James [name] MRN: [id number] Date of Birth: 07-05-1946 Referring Provider (PT): Rolena Infante   Encounter Date: 03/18/2021   PT End of Session - 03/18/21 0856     Visit Number 1    Date for PT Re-Evaluation 06/18/21    PT Start Time 0831    PT Stop Time 0915    PT Time Calculation (min) 44 min    Activity Tolerance Patient tolerated treatment well    Behavior During Therapy [facility] for tasks assessed/performed             Past Medical History:  Diagnosis Date   Abnormal EKG    NS ST-T EKG Changes, (-) Nuclear Stress Test 03/2006   Allergy    Anemia    iron deficency   Arthritis    B12 deficiency 06/05/2015   B12 Low NL at 271 methtylmalonic acid high   Cardiac murmur    nothing to be concerned with    Cataract    extractions   Duodenal ulcer 1972   transfusion required, 1 unit packed cells   Dysphagia    history throat/stomach  cancer "can swallow some, uses more pureed type foods"   Dyspnea    at times   Esophageal reflux    no currlently   Gastric cancer ([place]) 07/18/15   invasive adenocarcinoma   Gastrostomy tube in place [facility]) 2016   intermittent feeding   Hx of basal cell carcinoma    Hyperlipemia    no per pt 04-11-16   Melanoma ([place])    Prostate cancer ([place]) 09/06/14   last radiation 4'16    S/P radiation therapy 11/29/2014 through 01/23/2015                                                     Prostate 7800 cGy in 40 sessions, seminal vesicles 5600 cGy in 40 sessions                        Skin cancer, basal cell    Transfusion history    '72 -s/p surgery for duodenal ulcer, 12/17    Past Surgical History:  Procedure Laterality Date   BALLOON DILATION N/A 09/12/2015   Procedure: BALLOON DILATION;  Surgeon: Gatha Mayer, MD;  Location: [facility];  Service:  Endoscopy;  Laterality: N/A;   BALLOON DILATION N/A 09/21/2015   Procedure: BALLOON DILATION;  Surgeon: Gatha Mayer, MD;  Location: [facility];  Service: Endoscopy;  Laterality: N/A;   BALLOON DILATION N/A 10/04/2015   Procedure: BALLOON DILATION;  Surgeon: Milus Banister, MD;  Location: [facility];  Service: Endoscopy;  Laterality: N/A;   BALLOON DILATION N/A 12/10/2015   Procedure: BALLOON DILATION;  Surgeon: Gatha Mayer, MD;  [facility]: [facility];  Service: Endoscopy;  Laterality: N/A;   BALLOON DILATION N/A 10/10/2016   Procedure: BALLOON DILATION;  Surgeon: Gatha Mayer, MD;  [facility]: [facility];  Service: Endoscopy;  Laterality: N/A;   CATARACT EXTRACTION, BILATERAL Bilateral    COLONOSCOPY     colonoscopy with polypectomy  02/2012   Dr Sharlett Iles   ESOPHAGOGASTRODUODENOSCOPY (EGD) WITH PROPOFOL N/A 08/17/2015   Procedure:  ESOPHAGOGASTRODUODENOSCOPY (EGD) WITH PROPOFOL;  Surgeon: Gatha Mayer, MD;  Location: [facility];  Service: Endoscopy;  Laterality: N/A;   ESOPHAGOGASTRODUODENOSCOPY (EGD) WITH PROPOFOL N/A 09/12/2015   Procedure: ESOPHAGOGASTRODUODENOSCOPY (EGD) WITH PROPOFOL;  Surgeon: Gatha Mayer, MD;  [facility];  Service: Endoscopy;  Laterality: N/A;   ESOPHAGOGASTRODUODENOSCOPY (EGD) WITH PROPOFOL N/A 09/21/2015   Procedure: ESOPHAGOGASTRODUODENOSCOPY (EGD) WITH PROPOFOL;  Surgeon: Gatha Mayer, MD;  [facility];  Service: Endoscopy;  Laterality: N/A;   ESOPHAGOGASTRODUODENOSCOPY (EGD) WITH PROPOFOL N/A 10/04/2015   Procedure: ESOPHAGOGASTRODUODENOSCOPY (EGD) WITH PROPOFOL;  Surgeon: Milus Banister, MD;  [facility];  Service: Endoscopy;  Laterality: N/A;  possible stricture   ESOPHAGOGASTRODUODENOSCOPY (EGD) WITH PROPOFOL N/A 12/10/2015   Procedure: ESOPHAGOGASTRODUODENOSCOPY (EGD) WITH PROPOFOL;  Surgeon: Gatha Mayer, MD;  [facility];  Service: Endoscopy;  Laterality: N/A;   ESOPHAGOGASTRODUODENOSCOPY (EGD)  WITH PROPOFOL N/A 02/06/2016   Procedure: ESOPHAGOGASTRODUODENOSCOPY (EGD) WITH PROPOFOL;  Surgeon: Gatha Mayer, MD;  [facility];  Service: Endoscopy;  Laterality: N/A;   ESOPHAGOGASTRODUODENOSCOPY (EGD) WITH PROPOFOL N/A 10/10/2016   Procedure: ESOPHAGOGASTRODUODENOSCOPY (EGD) WITH PROPOFOL;  Surgeon: Gatha Mayer, MD;  [facility]: [facility];  Service: Endoscopy;  Laterality: N/A;  needs APC    EUS N/A 06/14/2015   Procedure: ESOPHAGEAL ENDOSCOPIC ULTRASOUND (EUS) RADIAL;  Surgeon: Milus Banister, MD;  [facility];  Service: Endoscopy;  Laterality: N/A;   HOT HEMOSTASIS N/A 10/10/2016   Procedure: HOT HEMOSTASIS (ARGON PLASMA COAGULATION/BICAP);  Surgeon: Gatha Mayer, MD;  Location: [facility];  Service: Endoscopy;  Laterality: N/A;   KNEE ARTHROSCOPY Right    Right Knee, [place] [facility]   LAPAROSCOPIC GASTRECTOMY N/A 07/18/2015   Procedure: SUB TOTAL GASTRECTOMY;  Surgeon: Stark Klein, MD;  [facility]: [facility];  Service: General;  Laterality: N/A;   LAPAROSCOPIC GASTROSTOMY N/A 07/18/2015   Procedure: FEEDING TUBE PLACEMENT;  Surgeon: Stark Klein, MD;  [facility]: [facility];  Service: General;  Laterality: N/A;   LAPAROSCOPY N/A 07/18/2015   Procedure: LAPAROSCOPY DIAGNOSTIC;  Surgeon: Stark Klein, MD;  Location: [facility];  Service: General;  Laterality: N/A;   MELANOMA EXCISION  02-01-16   abdominal wall   MOHS SURGERY  05/2020   nose   PROSTATE BIOPSY  09/06/14   ROTATOR CUFF REPAIR Right    R shoulder, [place]   SAVORY DILATION N/A 02/06/2016   Procedure: SAVORY DILATION;  Surgeon: Gatha Mayer, MD;  [facility];  Service: Endoscopy;  Laterality: N/A;   TOTAL SHOULDER ARTHROPLASTY Left 05/07/2017   Procedure: LEFT REVERSE TOTAL SHOULDER ARTHROPLASTY;  Surgeon: Justice Britain, MD;  Location: [facility];  Service: Orthopedics;  Laterality: Left;   TRIGGER FINGER RELEASE Left 1998   UPPER GASTROINTESTINAL ENDOSCOPY     x14   VASECTOMY      There were no vitals  filed for this visit.    Subjective Assessment - 03/18/21 0836     Subjective Patient reports that he is truly unsure of how this happened but does recall lifting a case of champagne at his daughters wedding, he reports pain in the right low back and pain at times in the right leg posteriorly to the calf, MRI showed stenosis multiple levels.  Sitting is worst    Limitations Lifting;Sitting;House hold activities    Patient Stated Goals hvae less pain and play golf without difficulty    Currently in Pain? Yes    Pain Score 0-No pain    Pain Location Back  Pain Orientation Right;Lower    Pain Descriptors / Indicators Aching    Pain Type Acute pain    Pain Radiating Towards down the right posterior leg    Pain Onset More than a month ago    Pain Frequency Intermittent    Aggravating Factors  sitting, driving, lifting pain up to 8/10    Pain Relieving Factors walk, tylenol pain can be 0/10    Effect of Pain on Daily Activities difficulty witting, getting up from sitting                [facility] PT Assessment - 03/18/21 0001       Assessment   Medical Diagnosis LBP with radiculopathy    Referring Provider (PT) Rolena Infante    Onset Date/Surgical Date 02/15/21    Hand Dominance Right    Prior Therapy for shoulder      Precautions   Precautions None      Home Environment   Additional Comments does some yardwork      Posture/Postural Control   Posture Comments rounded shoulders slouched posture      ROM / Strength   AROM / PROM / Strength AROM;Strength      AROM   Overall AROM Comments lumbar decreased 25%      Strength   Overall Strength Comments LE 4/5      Flexibility   Soft Tissue Assessment /Muscle Length yes    Hamstrings tight    ITB tight    Piriformis tight      Palpation   Palpation comment mild tightness in the lumbar,                        Objective measurements completed on examination: See above findings.       [facility] Adult PT  Treatment/Exercise - 03/18/21 0001       Modalities   Modalities Traction      Traction   Type of Traction Lumbar    Max (lbs) 75    Hold Time static    Time 12                      PT Short Term Goals - 03/18/21 0902       PT SHORT TERM GOAL #1   Title independent with initial HEP    Time 2    Period Weeks    Status New               PT Long Term Goals - 03/18/21 0902       PT LONG TERM GOAL #1   Title return to playing tennis 1x/week    Time 12    Period Weeks    Status New      PT LONG TERM GOAL #2   Title understand posture and body mechanics    Time 12    Period Weeks    Status New      PT LONG TERM GOAL #3   Title report pain decreased with sitting 50%    Time 12    Period Weeks    Status New      PT LONG TERM GOAL #4   Title increase lumbar ROM to WFL's    Time 12    Period Weeks    Status New      PT LONG TERM GOAL #5   Title no radicular symptoms    Time 12    Period Weeks  Status New                    Plan - 03/18/21 0857     Clinical Impression Statement Patient reports that he has had LBP with some right LE pain for about a month, he is unsure of the exact cause but does report lifting a case of champagne at his daughters wedding about a month ago.  He did have an MRI that showed stenosis at multiple levels.  He is tight in the LE's and has poor posture and poor body mechanics, he is an active golfer and tennis player, he is worst with sitting and getting up from sitting.  Reports better with walking and moving    Stability/Clinical Decision Making Stable/Uncomplicated    Rehab Potential Good    PT Frequency 2x / week    PT Duration 12 weeks    PT Treatment/Interventions ADLs/Self Care Home Management;Electrical Stimulation;Moist Heat;Traction;Therapeutic activities;Therapeutic exercise;Neuromuscular re-education;Manual techniques;Patient/family education    PT Next Visit Plan start core and postureal  exercises    Consulted and Agree with Plan of Care Patient             Patient will benefit from skilled therapeutic intervention in order to improve the following deficits and impairments:  Decreased range of motion, Increased muscle spasms, Pain, Impaired flexibility, Decreased strength, Postural dysfunction, Improper body mechanics  Visit Diagnosis: Acute right-sided low back pain with right-sided sciatica - Plan: PT plan of care cert/re-cert  Muscle spasm of back - Plan: PT plan of care cert/re-cert     Problem List Patient Active Problem List   Diagnosis Date Noted   Acute right-sided low back pain with right-sided sciatica 02/15/2021   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   Fear associated with healthcare 05/15/2020   Bilateral carotid bruits 04/18/2020   Ataxia 04/18/2020   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) 07/18/2015   Gastric cancer ([place]) 06/08/2015   B12 deficiency 06/05/2015   Prostate cancer ([place]) 10/24/2014   Vitamin D deficiency 06/03/2014   Basal cell cancer 09/17/2012   HEARING DEFICIT 12/21/2009   NONSPECIFIC ABNORMAL ELECTROCARDIOGRAM 12/21/2009   History of colonic polyps 12/04/2008   HYPERLIPIDEMIA 08/23/2007    Sumner Boast., PT 03/18/2021, 9:09 AM  [place]. [place], [place], [address] Phone: [phone]   Fax:  [phone]  Name: James [name] MRN: [id number] Date of Birth: 04-Sep-1946

## 2021-03-18 NOTE — Patient Instructions (Signed)
Access Code: [email or web address] URL: [email or web address] Date: 03/18/2021 Prepared by: Lum Babe  Exercises Hooklying Single Knee to Chest Stretch - 2 x daily - 7 x weekly - 1 sets - 10 reps - 10 hold Supine Double Knee to Chest - 2 x daily - 7 x weekly - 1 sets - 10 reps - 10 hold Supine Lower Trunk Rotation - 2 x daily - 7 x weekly - 1 sets - 10 reps - 10 hold

## 2021-03-20 DIAGNOSIS — M47816 Spondylosis without myelopathy or radiculopathy, lumbar region: Secondary | ICD-10-CM | POA: Diagnosis not present

## 2021-03-26 ENCOUNTER — Other Ambulatory Visit: Payer: Self-pay

## 2021-03-26 ENCOUNTER — Ambulatory Visit: Payer: Medicare Other | Admitting: Physical Therapy

## 2021-03-26 DIAGNOSIS — M62830 Muscle spasm of back: Secondary | ICD-10-CM | POA: Diagnosis not present

## 2021-03-26 DIAGNOSIS — M5441 Lumbago with sciatica, right side: Secondary | ICD-10-CM

## 2021-03-26 NOTE — Therapy (Signed)
[place]. [place], [place], [address] Phone: [phone]   Fax:  [phone]  Physical Therapy Treatment  Patient Details  Name: James [name] MRN: [id number] Date of Birth: 1945-11-23 Referring Provider (PT): Rolena Infante   Encounter Date: 03/26/2021   PT End of Session - 03/26/21 0823     Visit Number 2    Date for PT Re-Evaluation 06/18/21    PT Start Time 3790    PT Stop Time 0837    PT Time Calculation (min) 40 min             Past Medical History:  Diagnosis Date   Abnormal EKG    NS ST-T EKG Changes, (-) Nuclear Stress Test 03/2006   Allergy    Anemia    iron deficency   Arthritis    B12 deficiency 06/05/2015   B12 Low NL at 271 methtylmalonic acid high   Cardiac murmur    nothing to be concerned with    Cataract    extractions   Duodenal ulcer 1972   transfusion required, 1 unit packed cells   Dysphagia    history throat/stomach  cancer "can swallow some, uses more pureed type foods"   Dyspnea    at times   Esophageal reflux    no currlently   Gastric cancer ([place]) 07/18/15   invasive adenocarcinoma   Gastrostomy tube in place [facility]) 2016   intermittent feeding   Hx of basal cell carcinoma    Hyperlipemia    no per pt 04-11-16   Melanoma ([place])    Prostate cancer ([place]) 09/06/14   last radiation 4'16    S/P radiation therapy 11/29/2014 through 01/23/2015                                                     Prostate 7800 cGy in 40 sessions, seminal vesicles 5600 cGy in 40 sessions                        Skin cancer, basal cell    Transfusion history    '72 -s/p surgery for duodenal ulcer, 12/17    Past Surgical History:  Procedure Laterality Date   BALLOON DILATION N/A 09/12/2015   Procedure: BALLOON DILATION;  Surgeon: Gatha Mayer, MD;  [facility];  Service: Endoscopy;  Laterality: N/A;   BALLOON DILATION N/A 09/21/2015   Procedure: BALLOON DILATION;  Surgeon: Gatha Mayer, MD;  [facility];  Service: Endoscopy;  Laterality: N/A;   BALLOON DILATION N/A 10/04/2015   Procedure: BALLOON DILATION;  Surgeon: Milus Banister, MD;  [facility];  Service: Endoscopy;  Laterality: N/A;   BALLOON DILATION N/A 12/10/2015   Procedure: BALLOON DILATION;  Surgeon: Gatha Mayer, MD;  [facility];  Service: Endoscopy;  Laterality: N/A;   BALLOON DILATION N/A 10/10/2016   Procedure: BALLOON DILATION;  Surgeon: Gatha Mayer, MD;  [facility];  Service: Endoscopy;  Laterality: N/A;   CATARACT EXTRACTION, BILATERAL Bilateral    COLONOSCOPY     colonoscopy with polypectomy  02/2012   Dr Sharlett Iles   ESOPHAGOGASTRODUODENOSCOPY (EGD) WITH PROPOFOL N/A 08/17/2015   Procedure: ESOPHAGOGASTRODUODENOSCOPY (EGD) WITH PROPOFOL;  Surgeon: Gatha Mayer, MD;  Location: [place];  Service: Endoscopy;  Laterality:  N/A;   ESOPHAGOGASTRODUODENOSCOPY (EGD) WITH PROPOFOL N/A 09/12/2015   Procedure: ESOPHAGOGASTRODUODENOSCOPY (EGD) WITH PROPOFOL;  Surgeon: Gatha Mayer, MD;  [facility];  Service: Endoscopy;  Laterality: N/A;   ESOPHAGOGASTRODUODENOSCOPY (EGD) WITH PROPOFOL N/A 09/21/2015   Procedure: ESOPHAGOGASTRODUODENOSCOPY (EGD) WITH PROPOFOL;  Surgeon: Gatha Mayer, MD;  [facility];  Service: Endoscopy;  Laterality: N/A;   ESOPHAGOGASTRODUODENOSCOPY (EGD) WITH PROPOFOL N/A 10/04/2015   Procedure: ESOPHAGOGASTRODUODENOSCOPY (EGD) WITH PROPOFOL;  Surgeon: Milus Banister, MD;  [facility];  Service: Endoscopy;  Laterality: N/A;  possible stricture   ESOPHAGOGASTRODUODENOSCOPY (EGD) WITH PROPOFOL N/A 12/10/2015   Procedure: ESOPHAGOGASTRODUODENOSCOPY (EGD) WITH PROPOFOL;  Surgeon: Gatha Mayer, MD;  [facility];  Service: Endoscopy;  Laterality: N/A;   ESOPHAGOGASTRODUODENOSCOPY (EGD) WITH PROPOFOL N/A 02/06/2016   Procedure: ESOPHAGOGASTRODUODENOSCOPY (EGD) WITH PROPOFOL;  Surgeon: Gatha Mayer,  MD;  [facility];  Service: Endoscopy;  Laterality: N/A;   ESOPHAGOGASTRODUODENOSCOPY (EGD) WITH PROPOFOL N/A 10/10/2016   Procedure: ESOPHAGOGASTRODUODENOSCOPY (EGD) WITH PROPOFOL;  Surgeon: Gatha Mayer, MD;  [facility];  Service: Endoscopy;  Laterality: N/A;  needs APC    EUS N/A 06/14/2015   Procedure: ESOPHAGEAL ENDOSCOPIC ULTRASOUND (EUS) RADIAL;  Surgeon: Milus Banister, MD;  [facility];  Service: Endoscopy;  Laterality: N/A;   HOT HEMOSTASIS N/A 10/10/2016   Procedure: HOT HEMOSTASIS (ARGON PLASMA COAGULATION/BICAP);  Surgeon: Gatha Mayer, MD;  [facility];  Service: Endoscopy;  Laterality: N/A;   KNEE ARTHROSCOPY Right    Right Knee, [place] [facility]   LAPAROSCOPIC GASTRECTOMY N/A 07/18/2015   Procedure: SUB TOTAL GASTRECTOMY;  Surgeon: Stark Klein, MD;  [facility];  Service: General;  Laterality: N/A;   LAPAROSCOPIC GASTROSTOMY N/A 07/18/2015   Procedure: FEEDING TUBE PLACEMENT;  Surgeon: Stark Klein, MD;  [facility];  Service: General;  Laterality: N/A;   LAPAROSCOPY N/A 07/18/2015   Procedure: LAPAROSCOPY DIAGNOSTIC;  Surgeon: Stark Klein, MD;  [facility];  Service: General;  Laterality: N/A;   MELANOMA EXCISION  02-01-16   abdominal wall   MOHS SURGERY  05/2020   nose   PROSTATE BIOPSY  09/06/14   ROTATOR CUFF REPAIR Right    R shoulder, [place]   SAVORY DILATION N/A 02/06/2016   Procedure: SAVORY DILATION;  Surgeon: Gatha Mayer, MD;  [facility];  Service: Endoscopy;  Laterality: N/A;   TOTAL SHOULDER ARTHROPLASTY Left 05/07/2017   Procedure: LEFT REVERSE TOTAL SHOULDER ARTHROPLASTY;  Surgeon: Justice Britain, MD;  Location: [facility];  Service: Orthopedics;  Laterality: Left;   TRIGGER FINGER RELEASE Left 1998   UPPER GASTROINTESTINAL ENDOSCOPY     x14   VASECTOMY      There were no vitals filed for this visit.   Subjective Assessment - 03/26/21 0800     Subjective feeling good, golfed yesterday and  shot a 90 and tennis this afternoon. denies radiating symptoms. I really like the traction-it helps    Currently in Pain? No/denies                               [facility] Adult PT Treatment/Exercise - 03/26/21 0001       Exercises   Exercises Lumbar;Knee/Hip      Lumbar Exercises: Aerobic   Nustep L 5 31mn      Lumbar Exercises: Standing   Row Strengthening;Both;Theraband;20 reps    Theraband Level (Row) Level 2 (Red)  Shoulder Extension Strengthening;Both;20 reps;Theraband    Theraband Level (Shoulder Extension) Level 2 (Red)    Other Standing Lumbar Exercises horz abd and ER 15 red tband    Other Standing Lumbar Exercises hip ext and abd red tband 15 x BIL      Lumbar Exercises: Supine   Bridge Non-compliant;15 reps;3 seconds   obls with feet on ball 20x     Modalities   Modalities Traction      Traction   Type of Traction Lumbar    Max (lbs) 75    Hold Time static    Time 15      Manual Therapy   Manual Therapy Passive ROM    Passive ROM LE  and trunl                      PT Short Term Goals - 03/26/21 4854       PT SHORT TERM GOAL #1   Title independent with initial HEP    Status Achieved               PT Long Term Goals - 03/18/21 0902       PT LONG TERM GOAL #1   Title return to playing tennis 1x/week    Time 12    Period Weeks    Status New      PT LONG TERM GOAL #2   Title understand posture and body mechanics    Time 12    Period Weeks    Status New      PT LONG TERM GOAL #3   Title report pain decreased with sitting 50%    Time 12    Period Weeks    Status New      PT LONG TERM GOAL #4   Title increase lumbar ROM to WFL's    Time 12    Period Weeks    Status New      PT LONG TERM GOAL #5   Title no radicular symptoms    Time 12    Period Weeks    Status New                   Plan - 03/26/21 6270     Clinical Impression Statement STG met. Postural cuing with core activation  cuing with ex- tolerated well.Very tight LE RT>Left, during stretch pt states  at times RT leg goes numb". Positive response to traction    PT Treatment/Interventions ADLs/Self Care Home Management;Electrical Stimulation;Moist Heat;Traction;Therapeutic activities;Therapeutic exercise;Neuromuscular re-education;Manual techniques;Patient/family education    PT Next Visit Plan continue core and postural exercises. 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   Fear associated with healthcare 05/15/2020   Bilateral carotid bruits 04/18/2020   Ataxia 04/18/2020   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   Chronic fatigue 04/18/2020   GERD (gastroesophageal reflux disease) 02/02/2020   Left breast lump 07/15/2018   Hyperglycemia 07/07/2018   Nonintractable headache 07/08/2017  S/p reverse total shoulder arthroplasty 05/07/2017   Gastric AVM    Thrombocytopenia ([place]) 05/24/2016   Malignant melanoma ([place]) 01/08/2016   Anastomotic stricture of stomach    Esophageal stricture    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 [place], [place], [address] Phone: [phone]   Fax:  [phone]  Name: ABDOU STOCKS MRN: [id number] Date of Birth: January 24, 1946

## 2021-04-02 ENCOUNTER — Ambulatory Visit: Payer: Medicare Other | Admitting: Physical Therapy

## 2021-04-02 ENCOUNTER — Other Ambulatory Visit: Payer: Self-pay

## 2021-04-02 ENCOUNTER — Encounter: Payer: Self-pay | Admitting: Physical Therapy

## 2021-04-02 DIAGNOSIS — M62830 Muscle spasm of back: Secondary | ICD-10-CM

## 2021-04-02 DIAGNOSIS — M5441 Lumbago with sciatica, right side: Secondary | ICD-10-CM

## 2021-04-02 NOTE — Therapy (Signed)
[place]. [place], [place], [address] Phone: [phone]   Fax:  [phone]  Physical Therapy Treatment  Patient Details  Name: James [name] MRN: [id number] Date of Birth: March 31, 1946 Referring Provider (PT): Rolena Infante   Encounter Date: 04/02/2021   PT End of Session - 04/02/21 1133     Visit Number 3    Date for PT Re-Evaluation 06/18/21    PT Start Time 1057    PT Stop Time 1130    PT Time Calculation (min) 33 min    Activity Tolerance Patient tolerated treatment well    Behavior During Therapy [facility] for tasks assessed/performed             Past Medical History:  Diagnosis Date   Abnormal EKG    NS ST-T EKG Changes, (-) Nuclear Stress Test 03/2006   Allergy    Anemia    iron deficency   Arthritis    B12 deficiency 06/05/2015   B12 Low NL at 271 methtylmalonic acid high   Cardiac murmur    nothing to be concerned with    Cataract    extractions   Duodenal ulcer 1972   transfusion required, 1 unit packed cells   Dysphagia    history throat/stomach  cancer "can swallow some, uses more pureed type foods"   Dyspnea    at times   Esophageal reflux    no currlently   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/15   invasive adenocarcinoma   Gastrostomy tube in place [facility]) 2016   intermittent feeding   Hx of basal cell carcinoma    Hyperlipemia    no per pt 04-11-16   Melanoma ([place])    Prostate cancer ([place]) 09/06/14   last radiation 4'16    S/P radiation therapy 11/29/2014 through 01/23/2015                                                     Prostate 7800 cGy in 40 sessions, seminal vesicles 5600 cGy in 40 sessions                        Skin cancer, basal cell    Transfusion history    '72 -s/p surgery for duodenal ulcer, 12/17    Past Surgical History:  Procedure Laterality Date   BALLOON DILATION N/A 09/12/2015   Procedure: BALLOON DILATION;  Surgeon: Gatha Mayer, MD;  [facility];  Service:  Endoscopy;  Laterality: N/A;   BALLOON DILATION N/A 09/21/2015   Procedure: BALLOON DILATION;  Surgeon: Gatha Mayer, MD;  [facility];  Service: Endoscopy;  Laterality: N/A;   BALLOON DILATION N/A 10/04/2015   Procedure: BALLOON DILATION;  Surgeon: Milus Banister, MD;  [facility];  Service: Endoscopy;  Laterality: N/A;   BALLOON DILATION N/A 12/10/2015   Procedure: BALLOON DILATION;  Surgeon: Gatha Mayer, MD;  [facility];  Service: Endoscopy;  Laterality: N/A;   BALLOON DILATION N/A 10/10/2016   Procedure: BALLOON DILATION;  Surgeon: Gatha Mayer, MD;  [facility];  Service: Endoscopy;  Laterality: N/A;   CATARACT EXTRACTION, BILATERAL Bilateral    COLONOSCOPY     colonoscopy with polypectomy  02/2012   Dr Sharlett Iles   ESOPHAGOGASTRODUODENOSCOPY (EGD) WITH PROPOFOL N/A 08/17/2015   Procedure:  ESOPHAGOGASTRODUODENOSCOPY (EGD) WITH PROPOFOL;  Surgeon: Gatha Mayer, MD;  Location: [facility];  Service: Endoscopy;  Laterality: N/A;   ESOPHAGOGASTRODUODENOSCOPY (EGD) WITH PROPOFOL N/A 09/12/2015   Procedure: ESOPHAGOGASTRODUODENOSCOPY (EGD) WITH PROPOFOL;  Surgeon: Gatha Mayer, MD;  [facility];  Service: Endoscopy;  Laterality: N/A;   ESOPHAGOGASTRODUODENOSCOPY (EGD) WITH PROPOFOL N/A 09/21/2015   Procedure: ESOPHAGOGASTRODUODENOSCOPY (EGD) WITH PROPOFOL;  Surgeon: Gatha Mayer, MD;  [facility];  Service: Endoscopy;  Laterality: N/A;   ESOPHAGOGASTRODUODENOSCOPY (EGD) WITH PROPOFOL N/A 10/04/2015   Procedure: ESOPHAGOGASTRODUODENOSCOPY (EGD) WITH PROPOFOL;  Surgeon: Milus Banister, MD;  [facility];  Service: Endoscopy;  Laterality: N/A;  possible stricture   ESOPHAGOGASTRODUODENOSCOPY (EGD) WITH PROPOFOL N/A 12/10/2015   Procedure: ESOPHAGOGASTRODUODENOSCOPY (EGD) WITH PROPOFOL;  Surgeon: Gatha Mayer, MD;  [facility];  Service: Endoscopy;  Laterality: N/A;   ESOPHAGOGASTRODUODENOSCOPY (EGD)  WITH PROPOFOL N/A 02/06/2016   Procedure: ESOPHAGOGASTRODUODENOSCOPY (EGD) WITH PROPOFOL;  Surgeon: Gatha Mayer, MD;  [facility];  Service: Endoscopy;  Laterality: N/A;   ESOPHAGOGASTRODUODENOSCOPY (EGD) WITH PROPOFOL N/A 10/10/2016   Procedure: ESOPHAGOGASTRODUODENOSCOPY (EGD) WITH PROPOFOL;  Surgeon: Gatha Mayer, MD;  [facility];  Service: Endoscopy;  Laterality: N/A;  needs APC    EUS N/A 06/14/2015   Procedure: ESOPHAGEAL ENDOSCOPIC ULTRASOUND (EUS) RADIAL;  Surgeon: Milus Banister, MD;  [facility];  Service: Endoscopy;  Laterality: N/A;   HOT HEMOSTASIS N/A 10/10/2016   Procedure: HOT HEMOSTASIS (ARGON PLASMA COAGULATION/BICAP);  Surgeon: Gatha Mayer, MD;  [facility];  Service: Endoscopy;  Laterality: N/A;   KNEE ARTHROSCOPY Right    Right Knee, [place] [facility]   LAPAROSCOPIC GASTRECTOMY N/A 07/18/2015   Procedure: SUB TOTAL GASTRECTOMY;  Surgeon: Stark Klein, MD;  [facility];  Service: General;  Laterality: N/A;   LAPAROSCOPIC GASTROSTOMY N/A 07/18/2015   Procedure: FEEDING TUBE PLACEMENT;  Surgeon: Stark Klein, MD;  [facility];  Service: General;  Laterality: N/A;   LAPAROSCOPY N/A 07/18/2015   Procedure: LAPAROSCOPY DIAGNOSTIC;  Surgeon: Stark Klein, MD;  [facility];  Service: General;  Laterality: N/A;   MELANOMA EXCISION  02-01-16   abdominal wall   MOHS SURGERY  05/2020   nose   PROSTATE BIOPSY  09/06/14   ROTATOR CUFF REPAIR Right    R shoulder, [place]   SAVORY DILATION N/A 02/06/2016   Procedure: SAVORY DILATION;  Surgeon: Gatha Mayer, MD;  [facility];  Service: Endoscopy;  Laterality: N/A;   TOTAL SHOULDER ARTHROPLASTY Left 05/07/2017   Procedure: LEFT REVERSE TOTAL SHOULDER ARTHROPLASTY;  Surgeon: Justice Britain, MD;  Location: [facility];  Service: Orthopedics;  Laterality: Left;   TRIGGER FINGER RELEASE Left 1998   UPPER GASTROINTESTINAL ENDOSCOPY     x14   VASECTOMY      There were no vitals  filed for this visit.   Subjective Assessment - 04/02/21 1053     Subjective Pt is feeling good, he has been playing tennis and golf. He thinks the traction helps.    Pain Score 0-No pain    Pain Location Back                               [facility] Adult PT Treatment/Exercise - 04/02/21 0001       Lumbar Exercises: Stretches   Passive Hamstring Stretch Right;Left;2 reps;30 seconds      Lumbar Exercises: Aerobic   Recumbent Bike  L2.5 x 5 min      Lumbar Exercises: Standing   Other Standing Lumbar Exercises step ups 5# 2x10, lateral step up with hip abd 5# 2x10, on 6" hor abd with red thera 2x10    Other Standing Lumbar Exercises hip ext and abd 2x10 5#      Lumbar Exercises: Seated   Sit to Stand 20 reps   on airex with blue ball   Other Seated Lumbar Exercises Rows blue theraband 2x10      Manual Therapy   Manual Therapy Passive ROM    Passive ROM LE and trunk                      PT Short Term Goals - 03/26/21 1610       PT SHORT TERM GOAL #1   Title independent with initial HEP    Status Achieved               PT Long Term Goals - 04/02/21 1125       PT LONG TERM GOAL #1   Title return to playing tennis 1x/week    Time 12    Period Weeks    Status Achieved      PT LONG TERM GOAL #2   Title understand posture and body mechanics    Time 12    Period Weeks    Status Achieved      PT LONG TERM GOAL #3   Title report pain decreased with sitting 50%    Baseline numbess after sitting for a long time    Time 12    Period Weeks    Status On-going      PT LONG TERM GOAL #4   Title increase lumbar ROM to WFL's    Baseline WFL, hamstring tightness, some tigthness at end range during lateral flexion.    Time 12    Period Weeks    Status Partially Met      PT LONG TERM GOAL #5   Title no radicular symptoms    Baseline sitting    Time 12    Period Weeks    Status Partially Met                   Plan -  04/02/21 1130     Clinical Impression Statement LTG #1 and 2 met and progress towards all other goals. Pt tolerated all exercises well and continued to progress strengthening. He stated he doesnt think he needs any more PT at the beginning of treatment. He has an injection scehduled on 7/7 and 1 more therapy session scheduled, discussed discharging pt on next therapy session.  Noted hamstring tightness. Pt needed cueing for slowing down exercises. He denied traction following treatment.    PT Next Visit Plan continue core and postural exercises. stretching and traction, discharge?             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   Fear associated with healthcare 05/15/2020   Bilateral carotid  bruits 04/18/2020   Ataxia 04/18/2020   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   Chronic fatigue 04/18/2020   GERD (gastroesophageal reflux disease) 02/02/2020   Left breast lump 07/15/2018   Hyperglycemia 07/07/2018   Nonintractable headache 07/08/2017   S/p reverse total shoulder arthroplasty 05/07/2017   Gastric AVM    Thrombocytopenia ([place]) 05/24/2016   Malignant melanoma ([place]) 01/08/2016   Anastomotic stricture of stomach    Esophageal stricture    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 [place], [place], [address] Phone: [phone]   Fax:  [phone]  Name: James [name] MRN: [id number] Date of Birth: May 07, 1946

## 2021-04-03 ENCOUNTER — Ambulatory Visit (INDEPENDENT_AMBULATORY_CARE_PROVIDER_SITE_OTHER): Payer: Medicare Other

## 2021-04-03 DIAGNOSIS — E538 Deficiency of other specified B group vitamins: Secondary | ICD-10-CM | POA: Diagnosis not present

## 2021-04-03 NOTE — Progress Notes (Signed)
Pt here for monthly B12 injection per Dr Quay Burow  B12 10102mcg given IM left deltoid and pt tolerated injection well.  Next B12 injection scheduled for 05/03/21.

## 2021-04-09 ENCOUNTER — Ambulatory Visit: Payer: Medicare Other | Admitting: Physical Therapy

## 2021-04-11 ENCOUNTER — Encounter: Payer: Medicare Other | Admitting: Physical Therapy

## 2021-04-11 DIAGNOSIS — M47816 Spondylosis without myelopathy or radiculopathy, lumbar region: Secondary | ICD-10-CM | POA: Diagnosis not present

## 2021-04-16 ENCOUNTER — Ambulatory Visit: Payer: Medicare Other | Admitting: Physical Therapy

## 2021-04-17 DIAGNOSIS — Z85820 Personal history of malignant melanoma of skin: Secondary | ICD-10-CM | POA: Diagnosis not present

## 2021-04-17 DIAGNOSIS — L720 Epidermal cyst: Secondary | ICD-10-CM | POA: Diagnosis not present

## 2021-04-17 DIAGNOSIS — L821 Other seborrheic keratosis: Secondary | ICD-10-CM | POA: Diagnosis not present

## 2021-04-17 DIAGNOSIS — L218 Other seborrheic dermatitis: Secondary | ICD-10-CM | POA: Diagnosis not present

## 2021-04-17 DIAGNOSIS — L570 Actinic keratosis: Secondary | ICD-10-CM | POA: Diagnosis not present

## 2021-04-17 DIAGNOSIS — Z85828 Personal history of other malignant neoplasm of skin: Secondary | ICD-10-CM | POA: Diagnosis not present

## 2021-04-17 DIAGNOSIS — D1801 Hemangioma of skin and subcutaneous tissue: Secondary | ICD-10-CM | POA: Diagnosis not present

## 2021-04-17 DIAGNOSIS — D2262 Melanocytic nevi of left upper limb, including shoulder: Secondary | ICD-10-CM | POA: Diagnosis not present

## 2021-04-17 DIAGNOSIS — L814 Other melanin hyperpigmentation: Secondary | ICD-10-CM | POA: Diagnosis not present

## 2021-05-03 ENCOUNTER — Other Ambulatory Visit: Payer: Self-pay

## 2021-05-03 ENCOUNTER — Ambulatory Visit (INDEPENDENT_AMBULATORY_CARE_PROVIDER_SITE_OTHER): Payer: Medicare Other

## 2021-05-03 DIAGNOSIS — E538 Deficiency of other specified B group vitamins: Secondary | ICD-10-CM | POA: Diagnosis not present

## 2021-05-03 NOTE — Progress Notes (Signed)
B12 given Please cosign 

## 2021-05-11 ENCOUNTER — Other Ambulatory Visit: Payer: Self-pay | Admitting: Internal Medicine

## 2021-06-02 DIAGNOSIS — U071 COVID-19: Secondary | ICD-10-CM | POA: Diagnosis not present

## 2021-06-03 ENCOUNTER — Telehealth: Payer: Self-pay | Admitting: Internal Medicine

## 2021-06-03 NOTE — Telephone Encounter (Signed)
Patient went to urgent care over the weekend and got medicine for covid symptoms, he is feeling better. Patient tested positive on 8.26.22  Rescheduled his B12 to 9.1.22  Patient states he is feeling better  [facility] documenation scanned into [employer]

## 2021-06-05 ENCOUNTER — Ambulatory Visit: Payer: Medicare Other

## 2021-06-06 ENCOUNTER — Other Ambulatory Visit: Payer: Self-pay

## 2021-06-06 ENCOUNTER — Ambulatory Visit (INDEPENDENT_AMBULATORY_CARE_PROVIDER_SITE_OTHER): Payer: Medicare Other

## 2021-06-06 DIAGNOSIS — E538 Deficiency of other specified B group vitamins: Secondary | ICD-10-CM | POA: Diagnosis not present

## 2021-06-06 MED ORDER — CYANOCOBALAMIN 1000 MCG/ML IJ SOLN
1000.0000 ug | Freq: Once | INTRAMUSCULAR | Status: AC
  Administered 2021-06-06: 1000 ug via INTRAMUSCULAR

## 2021-06-06 NOTE — Progress Notes (Signed)
Vitamin B12 given to pt w/o any complications.

## 2021-06-10 ENCOUNTER — Telehealth: Payer: Self-pay | Admitting: Internal Medicine

## 2021-06-10 NOTE — Telephone Encounter (Signed)
LVM for pt to rtn my call to schedule AWV with [facility]. Please schedule this appt if pt calls the office.  °

## 2021-06-11 NOTE — Progress Notes (Signed)
Subjective:    Patient ID: James [name], male    DOB: 1946-01-25, 75 y.o.   MRN: [id number]  HPI The patient is here for an acute visit.   Right hand wound x 4 weeks - not healing - he thinks it happened when trimming shrubs in his yard.  He put antiseptic on it.  It is tender to touch- no pain w/o touching.  He gets pus to come out when he pushes on it.  He was concerned because it is not getting better.  He denies any fevers or chills.    Medications and allergies reviewed with patient and updated if appropriate.  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   Chronic fatigue 04/18/2020   GERD (gastroesophageal reflux disease) 02/02/2020   Left breast lump 07/15/2018   Hyperglycemia 07/07/2018   Nonintractable headache 07/08/2017   S/p reverse total shoulder arthroplasty 05/07/2017   Gastric AVM    Thrombocytopenia ([place]) 05/24/2016   Malignant melanoma ([place]) 01/08/2016   Anastomotic stricture of stomach    Esophageal stricture    Carcinoma of lesser curvature of stomach ([place]) 07/18/2015   Gastric cancer ([place]) 06/08/2015   B12 deficiency 06/05/2015   Prostate cancer ([place]) 10/24/2014   Vitamin D deficiency 06/03/2014   Basal cell cancer 09/17/2012   HEARING DEFICIT 12/21/2009   NONSPECIFIC ABNORMAL ELECTROCARDIOGRAM 12/21/2009   History of colonic polyps 12/04/2008   HYPERLIPIDEMIA 08/23/2007    Current Outpatient Medications on File Prior to Visit  Medication Sig Dispense Refill   acetaminophen (TYLENOL) 500 MG tablet Take 1,000 mg by mouth every 6 (six) hours as needed for mild pain.      Cholecalciferol (D3-1000 PO) Take by mouth daily.     Multiple Vitamin  (MULTIVITAMIN WITH MINERALS) TABS tablet Take 1 tablet by mouth every other day.      omeprazole (PRILOSEC) 40 MG capsule TAKE 1 CAPSULE DAILY 30 MINS BEFORE BREAKFAST OR SUPPER, OKAY TO OPEN AND SPRINKLE ON APPLESAUCE 90 capsule 1   pravastatin (PRAVACHOL) 40 MG tablet TAKE 1 TABLET BY MOUTH EVERYDAY AT BEDTIME 90 tablet 3   sildenafil (REVATIO) 20 MG tablet SMARTSIG:2-5 Tablet(s) By Mouth PRN     vitamin B-12 (CYANOCOBALAMIN) 1000 MCG tablet 2,000 mcg daily.      Current Facility-Administered Medications on File Prior to Visit  Medication Dose Route Frequency Provider Last Rate Last Admin   cyanocobalamin ((VITAMIN B-12)) injection 1,000 mcg  1,000 mcg Intramuscular Q30 days Janith Lima, MD   1,000 mcg at 05/03/21 1448   sodium chloride flush (NS) 0.9 % injection 10 mL  10 mL Intravenous PRN Penumalli, Earlean Polka, MD   4 mL at 06/21/20 0935    Past Medical History:  Diagnosis Date   Abnormal EKG    NS ST-T EKG Changes, (-) Nuclear Stress Test 03/2006   Allergy    Anemia    iron deficency   Arthritis    B12 deficiency 06/05/2015   B12 Low NL at 271 methtylmalonic acid high   Cardiac murmur    nothing to be concerned with    Cataract    extractions   Duodenal ulcer 1972   transfusion required, 1 unit packed cells  Dysphagia    history throat/stomach  cancer "can swallow some, uses more pureed type foods"   Dyspnea    at times   Esophageal reflux    no currlently   Gastric cancer ([place]) 07/18/15   invasive adenocarcinoma   Gastrostomy tube in place [facility]) 2016   intermittent feeding   Hx of basal cell carcinoma    Hyperlipemia    no per pt 04-11-16   Melanoma ([place])    Prostate cancer ([place]) 09/06/14   last radiation 4'16    S/P radiation therapy 11/29/2014 through 01/23/2015                                                     Prostate 7800 cGy in 40 sessions, seminal vesicles 5600 cGy in 40 sessions                        Skin cancer, basal cell    Transfusion history     '72 -s/p surgery for duodenal ulcer, 12/17    Past Surgical History:  Procedure Laterality Date   BALLOON DILATION N/A 09/12/2015   Procedure: BALLOON DILATION;  Surgeon: Gatha Mayer, MD;  Location: [facility];  Service: Endoscopy;  Laterality: N/A;   BALLOON DILATION N/A 09/21/2015   Procedure: BALLOON DILATION;  Surgeon: Gatha Mayer, MD;  [facility];  Service: Endoscopy;  Laterality: N/A;   BALLOON DILATION N/A 10/04/2015   Procedure: BALLOON DILATION;  Surgeon: Milus Banister, MD;  [facility];  Service: Endoscopy;  Laterality: N/A;   BALLOON DILATION N/A 12/10/2015   Procedure: BALLOON DILATION;  Surgeon: Gatha Mayer, MD;  [facility];  Service: Endoscopy;  Laterality: N/A;   BALLOON DILATION N/A 10/10/2016   Procedure: BALLOON DILATION;  Surgeon: Gatha Mayer, MD;  [facility];  Service: Endoscopy;  Laterality: N/A;   CATARACT EXTRACTION, BILATERAL Bilateral    COLONOSCOPY     colonoscopy with polypectomy  02/2012   Dr Sharlett Iles   ESOPHAGOGASTRODUODENOSCOPY (EGD) WITH PROPOFOL N/A 08/17/2015   Procedure: ESOPHAGOGASTRODUODENOSCOPY (EGD) WITH PROPOFOL;  Surgeon: Gatha Mayer, MD;  Location: [facility];  Service: Endoscopy;  Laterality: N/A;   ESOPHAGOGASTRODUODENOSCOPY (EGD) WITH PROPOFOL N/A 09/12/2015   Procedure: ESOPHAGOGASTRODUODENOSCOPY (EGD) WITH PROPOFOL;  Surgeon: Gatha Mayer, MD;  [facility];  Service: Endoscopy;  Laterality: N/A;   ESOPHAGOGASTRODUODENOSCOPY (EGD) WITH PROPOFOL N/A 09/21/2015   Procedure: ESOPHAGOGASTRODUODENOSCOPY (EGD) WITH PROPOFOL;  Surgeon: Gatha Mayer, MD;  [facility];  Service: Endoscopy;  Laterality: N/A;   ESOPHAGOGASTRODUODENOSCOPY (EGD) WITH PROPOFOL N/A 10/04/2015   Procedure: ESOPHAGOGASTRODUODENOSCOPY (EGD) WITH PROPOFOL;  Surgeon: Milus Banister, MD;  [facility];  Service: Endoscopy;  Laterality: N/A;  possible stricture    ESOPHAGOGASTRODUODENOSCOPY (EGD) WITH PROPOFOL N/A 12/10/2015   Procedure: ESOPHAGOGASTRODUODENOSCOPY (EGD) WITH PROPOFOL;  Surgeon: Gatha Mayer, MD;  [facility];  Service: Endoscopy;  Laterality: N/A;   ESOPHAGOGASTRODUODENOSCOPY (EGD) WITH PROPOFOL N/A 02/06/2016   Procedure: ESOPHAGOGASTRODUODENOSCOPY (EGD) WITH PROPOFOL;  Surgeon: Gatha Mayer, MD;  [facility];  Service: Endoscopy;  Laterality: N/A;   ESOPHAGOGASTRODUODENOSCOPY (EGD) WITH PROPOFOL N/A 10/10/2016   Procedure: ESOPHAGOGASTRODUODENOSCOPY (EGD) WITH PROPOFOL;  Surgeon: Gatha Mayer, MD;  [facility];  Service: Endoscopy;  Laterality: N/A;  needs APC  EUS N/A 06/14/2015   Procedure: ESOPHAGEAL ENDOSCOPIC ULTRASOUND (EUS) RADIAL;  Surgeon: Milus Banister, MD;  [facility];  Service: Endoscopy;  Laterality: N/A;   HOT HEMOSTASIS N/A 10/10/2016   Procedure: HOT HEMOSTASIS (ARGON PLASMA COAGULATION/BICAP);  Surgeon: Gatha Mayer, MD;  [facility];  Service: Endoscopy;  Laterality: N/A;   KNEE ARTHROSCOPY Right    Right Knee, [place] [facility]   LAPAROSCOPIC GASTRECTOMY N/A 07/18/2015   Procedure: SUB TOTAL GASTRECTOMY;  Surgeon: Stark Klein, MD;  [facility];  Service: General;  Laterality: N/A;   LAPAROSCOPIC GASTROSTOMY N/A 07/18/2015   Procedure: FEEDING TUBE PLACEMENT;  Surgeon: Stark Klein, MD;  [facility];  Service: General;  Laterality: N/A;   LAPAROSCOPY N/A 07/18/2015   Procedure: LAPAROSCOPY DIAGNOSTIC;  Surgeon: Stark Klein, MD;  [facility];  Service: General;  Laterality: N/A;   MELANOMA EXCISION  02-01-16   abdominal wall   MOHS SURGERY  05/2020   nose   PROSTATE BIOPSY  09/06/14   ROTATOR CUFF REPAIR Right    R shoulder, [place]   SAVORY DILATION N/A 02/06/2016   Procedure: SAVORY DILATION;  Surgeon: Gatha Mayer, MD;  [facility];  Service: Endoscopy;  Laterality: N/A;   TOTAL SHOULDER ARTHROPLASTY Left 05/07/2017   Procedure: LEFT  REVERSE TOTAL SHOULDER ARTHROPLASTY;  Surgeon: Justice Britain, MD;  Location: [facility];  Service: Orthopedics;  Laterality: Left;   TRIGGER FINGER RELEASE Left 1998   UPPER GASTROINTESTINAL ENDOSCOPY     x14   VASECTOMY      Social History   Socioeconomic History   Marital status: Married    Spouse name: Tye [place]   Number of children: 2   Years of education: Not on file   Highest education level: Not on file  Occupational History   Occupation: retired  Tobacco Use   Smoking status: Former    Packs/day: 0.50    Years: 5.00    Pack years: 2.50    Types: Cigarettes    Quit date: 10/07/1967    Years since quitting: 53.7   Smokeless tobacco: Never   Tobacco comments:    smoked [place] , up to < 1 ppd  Vaping Use   Vaping Use: Never used  Substance and Sexual Activity   Alcohol use: Yes    Comment: maybe once a week   Drug use: No   Sexual activity: Yes  Other Topics Concern   Not on file  Social History Narrative   Married and retired - 1 son and 1 daughter    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  Second home at [facility], [employer] condominium   Very occasional alcohol, former smoker   Social Determinants of Radio broadcast assistant Strain: Not on [employer] Insecurity: Not on file  Transportation Needs: Not on file  Physical Activity: Not on file  Stress: Not on file  Social Connections: Not on file    Family History  Problem Relation Age of Onset   Diabetes Father    Heart attack Father 78   Liver disease Father        Related to Alcohol Use    Alcohol abuse Mother    Cirrhosis Mother        died in her 84s   Multiple myeloma Brother    Diabetes Sister    Colon cancer Neg Hx    Stomach cancer Neg Hx    Stroke Neg Hx  Esophageal cancer Neg Hx    Rectal cancer Neg Hx     Review of Systems  Constitutional:  Negative for chills and fever.  Skin:  Positive for color change and wound.      Objective:   Vitals:    06/12/21 0841  BP: 102/70  Pulse: (!) 55  Temp: 98.2 F (36.8 C)  SpO2: 96%   BP Readings from Last 3 Encounters:  06/12/21 102/70  02/26/21 106/60  02/15/21 112/80   Wt Readings from Last 3 Encounters:  06/12/21 150 lb 3.2 oz (68.1 kg)  02/26/21 152 lb (68.9 kg)  02/15/21 151 lb 9.6 oz (68.8 kg)   Body mass index is 24.24 kg/m.   Physical Exam Constitutional:      General: He is not in acute distress.    Appearance: Normal appearance. He is not ill-appearing.  HENT:     Head: Normocephalic and atraumatic.  Skin:    General: Skin is warm and dry.     Comments: Right posterior hand near fourth MCP joint there is a patch of erythema with a open wound with clear discharge, 2 very small fluid-filled blisters surrounding.  Slight tenderness to touch.  No surrounding erythema or swelling.  Normal sensation  Neurological:     Mental Status: He is alert.           Assessment & Plan:    See Problem List for Assessment and Plan of chronic medical problems.    This visit occurred during the SARS-CoV-2 public health emergency.  Safety protocols were in place, including screening questions prior to the visit, additional usage of staff PPE, and extensive cleaning of exam room while observing appropriate contact time as indicated for disinfecting solutions.

## 2021-06-12 ENCOUNTER — Other Ambulatory Visit: Payer: Self-pay | Admitting: Internal Medicine

## 2021-06-12 ENCOUNTER — Other Ambulatory Visit: Payer: Self-pay

## 2021-06-12 ENCOUNTER — Ambulatory Visit (INDEPENDENT_AMBULATORY_CARE_PROVIDER_SITE_OTHER): Payer: Medicare Other | Admitting: Internal Medicine

## 2021-06-12 ENCOUNTER — Encounter: Payer: Self-pay | Admitting: Internal Medicine

## 2021-06-12 VITALS — BP 102/70 | HR 55 | Temp 98.2°F | Ht 66.0 in | Wt 150.2 lb

## 2021-06-12 DIAGNOSIS — S61431A Puncture wound without foreign body of right hand, initial encounter: Secondary | ICD-10-CM

## 2021-06-12 DIAGNOSIS — Z23 Encounter for immunization: Secondary | ICD-10-CM | POA: Diagnosis not present

## 2021-06-12 DIAGNOSIS — L089 Local infection of the skin and subcutaneous tissue, unspecified: Secondary | ICD-10-CM | POA: Insufficient documentation

## 2021-06-12 MED ORDER — MUPIROCIN CALCIUM 2 % EX CREA: 1.0000 "application " | TOPICAL_CREAM | Freq: Two times a day (BID) | CUTANEOUS | 0 refills | Status: DC

## 2021-06-12 MED ORDER — SULFAMETHOXAZOLE-TRIMETHOPRIM 800-160 MG PO TABS: 1.0000 | ORAL_TABLET | Freq: Two times a day (BID) | ORAL | 0 refills | Status: DC

## 2021-06-12 NOTE — Assessment & Plan Note (Signed)
Acute Wound in right posterior hand for about 4 weeks it does not seem to be healing-he believes it was from trimming the bushes There is some tenderness with palpation and some pus discharge Tdap up-to-date Start Bactrim DS twice daily x7 days Bactroban twice daily Advised him to call if there is no improvement

## 2021-06-12 NOTE — Patient Instructions (Addendum)
   Medications changes include :   Bactrim twice a day for one week.  Apply bactroban cream twice daily  Your prescription(s) have been submitted to your pharmacy. Please take as directed and contact our office if you believe you are having problem(s) with the medication(s).    Please call if there is no improvement in your symptoms.

## 2021-07-08 ENCOUNTER — Ambulatory Visit (INDEPENDENT_AMBULATORY_CARE_PROVIDER_SITE_OTHER): Payer: Medicare Other

## 2021-07-08 ENCOUNTER — Other Ambulatory Visit: Payer: Self-pay

## 2021-07-08 DIAGNOSIS — E538 Deficiency of other specified B group vitamins: Secondary | ICD-10-CM

## 2021-07-08 MED ORDER — CYANOCOBALAMIN 1000 MCG/ML IJ SOLN
1000.0000 ug | Freq: Once | INTRAMUSCULAR | Status: AC
  Administered 2021-07-08: 1000 ug via INTRAMUSCULAR

## 2021-07-09 NOTE — Progress Notes (Signed)
b12 Injection given.   Mia Winthrop J Jaimes Eckert, MD  

## 2021-07-11 ENCOUNTER — Inpatient Hospital Stay: Payer: Medicare Other | Attending: Oncology | Admitting: Oncology

## 2021-07-11 ENCOUNTER — Other Ambulatory Visit: Payer: Self-pay

## 2021-07-11 ENCOUNTER — Other Ambulatory Visit: Payer: Medicare Other

## 2021-07-11 ENCOUNTER — Inpatient Hospital Stay: Payer: Medicare Other

## 2021-07-11 ENCOUNTER — Ambulatory Visit: Payer: Medicare Other | Admitting: Oncology

## 2021-07-11 VITALS — BP 130/81 | HR 65 | Temp 98.2°F | Ht 66.0 in | Wt 154.4 lb

## 2021-07-11 DIAGNOSIS — E538 Deficiency of other specified B group vitamins: Secondary | ICD-10-CM

## 2021-07-11 DIAGNOSIS — C165 Malignant neoplasm of lesser curvature of stomach, unspecified: Secondary | ICD-10-CM | POA: Diagnosis not present

## 2021-07-11 DIAGNOSIS — Z923 Personal history of irradiation: Secondary | ICD-10-CM | POA: Diagnosis not present

## 2021-07-11 DIAGNOSIS — Z85028 Personal history of other malignant neoplasm of stomach: Secondary | ICD-10-CM | POA: Insufficient documentation

## 2021-07-11 DIAGNOSIS — Z9221 Personal history of antineoplastic chemotherapy: Secondary | ICD-10-CM | POA: Diagnosis not present

## 2021-07-11 DIAGNOSIS — Z85820 Personal history of malignant melanoma of skin: Secondary | ICD-10-CM | POA: Diagnosis not present

## 2021-07-11 LAB — CBC WITH DIFFERENTIAL (CANCER CENTER ONLY)
Abs Immature Granulocytes: 0 10*3/uL (ref 0.00–0.07)
Basophils Absolute: 0 10*3/uL (ref 0.0–0.1)
Basophils Relative: 1 %
Eosinophils Absolute: 0.3 10*3/uL (ref 0.0–0.5)
Eosinophils Relative: 5 %
HCT: 40 % (ref 39.0–52.0)
Hemoglobin: 13.4 g/dL (ref 13.0–17.0)
Immature Granulocytes: 0 %
Lymphocytes Relative: 35 %
Lymphs Abs: 1.9 10*3/uL (ref 0.7–4.0)
MCH: 31.8 pg (ref 26.0–34.0)
MCHC: 33.5 g/dL (ref 30.0–36.0)
MCV: 94.8 fL (ref 80.0–100.0)
Monocytes Absolute: 0.6 10*3/uL (ref 0.1–1.0)
Monocytes Relative: 11 %
Neutro Abs: 2.6 10*3/uL (ref 1.7–7.7)
Neutrophils Relative %: 48 %
Platelet Count: 163 10*3/uL (ref 150–400)
RBC: 4.22 MIL/uL (ref 4.22–5.81)
RDW: 15.2 % (ref 11.5–15.5)
WBC Count: 5.3 10*3/uL (ref 4.0–10.5)
nRBC: 0 % (ref 0.0–0.2)

## 2021-07-11 NOTE — Progress Notes (Signed)
[facility] OFFICE PROGRESS NOTE   Diagnosis: Gastric cancer  INTERVAL HISTORY:   James [name] returns as scheduled.  He feels well.  Good appetite and energy.  He has early satiety when he eats a large meal.  He underwent an upper endoscopy for dilation of a benign stricture on 02/26/2021.  He has mild dysphagia, improved following the esophagus dilation.  Objective:  Vital signs in last 24 hours:  Blood pressure 130/81, pulse 65, temperature 98.2 F (36.8 C), temperature source Oral, height 5\' 6"  (1.676 m), weight 154 lb 6.4 oz (70 kg), SpO2 100 %.    Lymphatics: No cervical, supraclavicular, or inguinal nodes.  "Shotty "bilateral axillary nodes. Resp: Lungs clear bilaterally Cardio: Regular rate and rhythm GI: No mass, no hepatosplenomegaly Vascular: No leg edema   Lab Results  Component Value Date   WBC 5.3 07/11/2021   HGB 13.4 07/11/2021   HCT 40.0 07/11/2021   MCV 94.8 07/11/2021   PLT 163 07/11/2021   NEUTROABS 2.6 07/11/2021    CMP  Lab Results  Component Value Date   NA 142 04/18/2020   K 4.3 04/18/2020   CL 106 04/18/2020   CO2 28 04/18/2020   GLUCOSE 91 04/18/2020   BUN 14 04/18/2020   CREATININE 0.90 04/18/2020   CALCIUM 8.8 04/18/2020   PROT 6.4 02/02/2020   ALBUMIN 4.1 02/02/2020   AST 25 02/02/2020   ALT 10 02/02/2020   ALKPHOS 58 02/02/2020   BILITOT 0.6 02/02/2020   GFRNONAA 83 04/18/2020   GFRAA 96 04/18/2020     Medications: I have reviewed the patient's current medications.   Assessment/Plan: Gastric cancer-adenocarcinoma of the gastric fundus/cardia Staging CT scans 06/07/2015 with no evidence of distant metastatic disease, tiny nonspecific pulmonary nodules and liver lesions-likely benign Subtotal gastrectomy and placement of a jejunostomy feeding tube 07/18/2015 for a T2,N0 tumor with negative surgical margins Initiation of adjuvant 5-FU/leucovorin 08/28/2015, status post 3 weekly treatments completed  09/13/2015 Initiation of radiation and concurrent Xeloda 10/29/2015 11/26/2015 Xeloda dose reduced to 1000 mg twice daily due to progressive thrombocytopenia Radiation completed 12/06/2015 5-FU/leucovorin resumed 12/21/2015       History of microcytic anemia-likely iron deficiency anemia secondary to #1 Progressive anemia 09/16/2016-secondary to 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 Upper endoscopy 10/02/2016 with findings of multiple small bleeding angioectasias status post argon plasma coagulation on 10/10/2016   3.    Vitamin B-12 deficiency-Low-level confirmed 01/04/2016 and again 07/09/2018, maintained on vitamin B12 injection therapy  4.   T1c prostate cancer-status post external beam radiation completed April 2016   5.    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   Anastomotic stricture-status post balloon dilatation 08/22/2015, status post electroincision dilation at [facility] 10/11/2015, repeat EGD/ dilatation by Dr. Carlean Purl 11/01/2015 and 12/10/2015; EGD 10/02/2016 with finding of esophageal stenosis at the gastroesophageal junction status post dilation 10/10/2016, 01/21/2018, 06/20/2019 Benign stenosis at the esophageal anastomosis dilated 02/26/2021   7.    Mild neutropenia/thrombocytopenia secondary to chemotherapy. Progressive thrombocytopenia 11/26/2015. Xeloda dose reduced to 1000 mg twice daily. Counts stable to improved 01/04/2016.    8.    Erythema/tenderness at the jejunostomy feeding tube site 09/20/2015, improved   9.    Feeding tube removed 01/04/2016   10.  Melanoma left lower abdominal wall status post  wide excision.        Disposition: James [name] is in remission from gastric cancer.  He would like to [facility]up [facility].  He will return for an office visit in 1 year.  He reports he is up-to-date on COVID-19 and influenza vaccines.  Betsy Coder, MD  07/11/2021  8:12 AM

## 2021-08-06 DIAGNOSIS — Z8546 Personal history of malignant neoplasm of prostate: Secondary | ICD-10-CM | POA: Diagnosis not present

## 2021-08-08 ENCOUNTER — Ambulatory Visit: Payer: Medicare Other

## 2021-08-12 ENCOUNTER — Ambulatory Visit (INDEPENDENT_AMBULATORY_CARE_PROVIDER_SITE_OTHER): Payer: Medicare Other

## 2021-08-12 ENCOUNTER — Other Ambulatory Visit: Payer: Self-pay

## 2021-08-12 DIAGNOSIS — E538 Deficiency of other specified B group vitamins: Secondary | ICD-10-CM | POA: Diagnosis not present

## 2021-08-12 MED ORDER — CYANOCOBALAMIN 1000 MCG/ML IJ SOLN
1000.0000 ug | Freq: Once | INTRAMUSCULAR | Status: AC
  Administered 2021-08-12: 1000 ug via INTRAMUSCULAR

## 2021-08-12 NOTE — Progress Notes (Signed)
Patient here for monthly B12 injection per Dr. Burns. B12 1000 mcg given left IM and patient tolerated injection well today.  

## 2021-08-12 NOTE — Progress Notes (Signed)
b12 Injection given.   James [name] J Mahamed Zalewski, MD  

## 2021-08-14 DIAGNOSIS — Z8546 Personal history of malignant neoplasm of prostate: Secondary | ICD-10-CM | POA: Diagnosis not present

## 2021-08-14 DIAGNOSIS — N5201 Erectile dysfunction due to arterial insufficiency: Secondary | ICD-10-CM | POA: Diagnosis not present

## 2021-09-04 DIAGNOSIS — H26492 Other secondary cataract, left eye: Secondary | ICD-10-CM | POA: Diagnosis not present

## 2021-09-04 DIAGNOSIS — Z961 Presence of intraocular lens: Secondary | ICD-10-CM | POA: Diagnosis not present

## 2021-09-12 ENCOUNTER — Ambulatory Visit (INDEPENDENT_AMBULATORY_CARE_PROVIDER_SITE_OTHER): Payer: Medicare Other

## 2021-09-12 ENCOUNTER — Other Ambulatory Visit: Payer: Self-pay

## 2021-09-12 DIAGNOSIS — E538 Deficiency of other specified B group vitamins: Secondary | ICD-10-CM | POA: Diagnosis not present

## 2021-09-12 MED ORDER — CYANOCOBALAMIN 1000 MCG/ML IJ SOLN
1000.0000 ug | Freq: Once | INTRAMUSCULAR | Status: AC
  Administered 2021-09-12: 1000 ug via INTRAMUSCULAR

## 2021-09-12 NOTE — Progress Notes (Signed)
Pt was given B12 w/o any complications. 

## 2021-09-18 DIAGNOSIS — H26492 Other secondary cataract, left eye: Secondary | ICD-10-CM | POA: Diagnosis not present

## 2021-10-14 ENCOUNTER — Other Ambulatory Visit: Payer: Self-pay

## 2021-10-14 ENCOUNTER — Ambulatory Visit: Payer: Medicare Other

## 2021-10-14 DIAGNOSIS — E538 Deficiency of other specified B group vitamins: Secondary | ICD-10-CM

## 2021-10-14 MED ORDER — CYANOCOBALAMIN 1000 MCG/ML IJ SOLN
1000.0000 ug | Freq: Once | INTRAMUSCULAR | Status: AC
  Administered 2021-10-14: 1000 ug via INTRAMUSCULAR

## 2021-10-14 NOTE — Progress Notes (Unsigned)
B12 injection given Right deltoid tolerated well.

## 2021-10-17 DIAGNOSIS — L0889 Other specified local infections of the skin and subcutaneous tissue: Secondary | ICD-10-CM | POA: Diagnosis not present

## 2021-10-17 DIAGNOSIS — R21 Rash and other nonspecific skin eruption: Secondary | ICD-10-CM | POA: Diagnosis not present

## 2021-10-17 DIAGNOSIS — L986 Other infiltrative disorders of the skin and subcutaneous tissue: Secondary | ICD-10-CM | POA: Diagnosis not present

## 2021-10-17 DIAGNOSIS — Z85828 Personal history of other malignant neoplasm of skin: Secondary | ICD-10-CM | POA: Diagnosis not present

## 2021-10-17 DIAGNOSIS — Z85820 Personal history of malignant melanoma of skin: Secondary | ICD-10-CM | POA: Diagnosis not present

## 2021-10-17 DIAGNOSIS — D485 Neoplasm of uncertain behavior of skin: Secondary | ICD-10-CM | POA: Diagnosis not present

## 2021-11-18 ENCOUNTER — Other Ambulatory Visit: Payer: Self-pay

## 2021-11-18 ENCOUNTER — Ambulatory Visit (INDEPENDENT_AMBULATORY_CARE_PROVIDER_SITE_OTHER): Payer: Medicare Other

## 2021-11-18 DIAGNOSIS — E538 Deficiency of other specified B group vitamins: Secondary | ICD-10-CM

## 2021-11-18 MED ORDER — CYANOCOBALAMIN 1000 MCG/ML IJ SOLN
1000.0000 ug | Freq: Once | INTRAMUSCULAR | Status: AC
  Administered 2021-11-18: 1000 ug via INTRAMUSCULAR

## 2021-11-18 NOTE — Progress Notes (Signed)
Patient here for monthly B12 injection per Dr.Burns. B12 1000 mcg given left M and patient tolerated injection well today.

## 2021-11-26 DIAGNOSIS — Z961 Presence of intraocular lens: Secondary | ICD-10-CM | POA: Diagnosis not present

## 2021-12-12 DIAGNOSIS — Z85828 Personal history of other malignant neoplasm of skin: Secondary | ICD-10-CM | POA: Diagnosis not present

## 2021-12-12 DIAGNOSIS — C44319 Basal cell carcinoma of skin of other parts of face: Secondary | ICD-10-CM | POA: Diagnosis not present

## 2021-12-12 DIAGNOSIS — D485 Neoplasm of uncertain behavior of skin: Secondary | ICD-10-CM | POA: Diagnosis not present

## 2021-12-12 DIAGNOSIS — Z85820 Personal history of malignant melanoma of skin: Secondary | ICD-10-CM | POA: Diagnosis not present

## 2021-12-18 ENCOUNTER — Ambulatory Visit (INDEPENDENT_AMBULATORY_CARE_PROVIDER_SITE_OTHER): Payer: Medicare Other

## 2021-12-18 ENCOUNTER — Other Ambulatory Visit: Payer: Self-pay

## 2021-12-18 DIAGNOSIS — E538 Deficiency of other specified B group vitamins: Secondary | ICD-10-CM

## 2021-12-18 MED ORDER — CYANOCOBALAMIN 1000 MCG/ML IJ SOLN
1000.0000 ug | Freq: Once | INTRAMUSCULAR | Status: AC
  Administered 2021-12-18: 1000 ug via INTRAMUSCULAR

## 2021-12-18 NOTE — Progress Notes (Signed)
Pt here for monthly B12 injection per Dr. Quay Burow ? ?B12 1072mg given IM, and pt tolerated injection well. ? ?Next B12 injection scheduled for 01/20/22. ?

## 2022-01-07 ENCOUNTER — Ambulatory Visit (INDEPENDENT_AMBULATORY_CARE_PROVIDER_SITE_OTHER): Payer: Medicare Other

## 2022-01-07 DIAGNOSIS — Z0000 Encounter for general adult medical examination without abnormal findings: Secondary | ICD-10-CM | POA: Diagnosis not present

## 2022-01-07 NOTE — Patient Instructions (Signed)
James [name] , ?Thank you for taking time to come for your Medicare Wellness Visit. I appreciate your ongoing commitment to your health goals. Please review the following plan we discussed and let me know if I can assist you in the future.  ? ?Screening recommendations/referrals: ?Colonoscopy: Not a candidate for screening due to age ?Recommended yearly ophthalmology/optometry visit for glaucoma screening and checkup ?Recommended yearly dental visit for hygiene and checkup ? ?Vaccinations: ?Influenza vaccine: 06/12/2021  ?Pneumococcal vaccine: 06/30/2016, 07/05/2016   ?Tdap vaccine: 04/18/2020 ?Shingles vaccine: 11/24/2017   ?Covid-19: 10/28/2019, 11/18/2019, 08/04/2020, 03/12/2021 ? ?Advanced directives: Please bring a copy of your health care power of attorney and living will to the office at your convenience. ? ?Conditions/risks identified: Yes ? ?Next appointment: Please schedule your next Medicare Wellness Visit with your Nurse Health Advisor in 1 year or 366 days by calling [phone]. ? ?Preventive Care 77 Years and Older, Male ?Preventive care refers to lifestyle choices and visits with your health care provider that can promote health and wellness. ?What does preventive care include? ?A yearly physical exam. This is also called an annual well check. ?Dental exams once or twice a year. ?Routine eye exams. Ask your health care provider how often you should have your eyes checked. ?Personal lifestyle choices, including: ?Daily care of your teeth and gums. ?Regular physical activity. ?Eating a healthy diet. ?Avoiding tobacco and drug use. ?Limiting alcohol use. ?Practicing safe sex. ?Taking low doses of aspirin every day. ?Taking vitamin and mineral supplements as recommended by your health care provider. ?What happens during an annual well check? ?The services and screenings done by your health care provider during your annual well check will depend on your age, overall health, lifestyle risk factors, and family history  of disease. ?Counseling  ?Your health care provider may ask you questions about your: ?Alcohol use. ?Tobacco use. ?Drug use. ?Emotional well-being. ?Home and relationship well-being. ?Sexual activity. ?Eating habits. ?History of falls. ?Memory and ability to understand (cognition). ?Work and work Statistician. ?Screening  ?You may have the following tests or measurements: ?Height, weight, and BMI. ?Blood pressure. ?Lipid and cholesterol levels. These may be checked every 5 years, or more frequently if you are over 66 years old. ?Skin check. ?Lung cancer screening. You may have this screening every year starting at age 77 if you have a 30-pack-year history of smoking and currently smoke or have quit within the past 15 years. ?Fecal occult blood test (FOBT) of the stool. You may have this test every year starting at age 77. ?Flexible sigmoidoscopy or colonoscopy. You may have a sigmoidoscopy every 5 years or a colonoscopy every 10 years starting at age 77. ?Prostate cancer screening. Recommendations will vary depending on your family history and other risks. ?Hepatitis C blood test. ?Hepatitis B blood test. ?Sexually transmitted disease (STD) testing. ?Diabetes screening. This is done by checking your blood sugar (glucose) after you have not eaten for a while (fasting). You may have this done every 1-3 years. ?Abdominal aortic aneurysm (AAA) screening. You may need this if you are a current or former smoker. ?Osteoporosis. You may be screened starting at age 77 if you are at high risk. ?Talk with your health care provider about your test results, treatment options, and if necessary, the need for more tests. ?Vaccines  ?Your health care provider may recommend certain vaccines, such as: ?Influenza vaccine. This is recommended every year. ?Tetanus, diphtheria, and acellular pertussis (Tdap, Td) vaccine. You may need a Td booster every 10 years. ?Zoster  vaccine. You may need this after age 27. ?Pneumococcal 13-valent  conjugate (PCV13) vaccine. One dose is recommended after age 105. ?Pneumococcal polysaccharide (PPSV23) vaccine. One dose is recommended after age 36. ?Talk to your health care provider about which screenings and vaccines you need and how often you need them. ?This information is not intended to replace advice given to you by your health care provider. Make sure you discuss any questions you have with your health care provider. ?Document Released: 10/19/2015 Document Revised: 06/11/2016 Document Reviewed: 07/24/2015 ?Elsevier Interactive Patient Education ? 2017 [place]. ? ?Fall Prevention in the Home ?Falls can cause injuries. They can happen to people of all ages. There are many things you can do to make your home safe and to help prevent falls. ?What can I do on the outside of my home? ?Regularly fix the edges of walkways and driveways and fix any cracks. ?Remove anything that might make you trip as you walk through a door, such as a raised step or threshold. ?Trim any bushes or trees on the path to your home. ?Use bright outdoor lighting. ?Clear any walking paths of anything that might make someone trip, such as rocks or tools. ?Regularly check to see if handrails are loose or broken. Make sure that both sides of any steps have handrails. ?Any raised decks and porches should have guardrails on the edges. ?Have any leaves, snow, or ice cleared regularly. ?Use sand or salt on walking paths during winter. ?Clean up any spills in your garage right away. This includes oil or grease spills. ?What can I do in the bathroom? ?Use night lights. ?Install grab bars by the toilet and in the tub and shower. Do not use towel bars as grab bars. ?Use non-skid mats or decals in the tub or shower. ?If you need to sit down in the shower, use a plastic, non-slip stool. ?Keep the floor dry. Clean up any water that spills on the floor as soon as it happens. ?Remove soap buildup in the tub or shower regularly. ?Attach bath mats  securely with double-sided non-slip rug tape. ?Do not have throw rugs and other things on the floor that can make you trip. ?What can I do in the bedroom? ?Use night lights. ?Make sure that you have a light by your bed that is easy to reach. ?Do not use any sheets or blankets that are too big for your bed. They should not hang down onto the floor. ?Have a firm chair that has side arms. You can use this for support while you get dressed. ?Do not have throw rugs and other things on the floor that can make you trip. ?What can I do in the kitchen? ?Clean up any spills right away. ?Avoid walking on wet floors. ?Keep items that you use a lot in easy-to-reach places. ?If you need to reach something above you, use a strong step stool that has a grab bar. ?Keep electrical cords out of the way. ?Do not use floor polish or wax that makes floors slippery. If you must use wax, use non-skid floor wax. ?Do not have throw rugs and other things on the floor that can make you trip. ?What can I do with my stairs? ?Do not leave any items on the stairs. ?Make sure that there are handrails on both sides of the stairs and use them. Fix handrails that are broken or loose. Make sure that handrails are as long as the stairways. ?Check any carpeting to make sure that it  is firmly attached to the stairs. Fix any carpet that is loose or worn. ?Avoid having throw rugs at the top or bottom of the stairs. If you do have throw rugs, attach them to the floor with carpet tape. ?Make sure that you have a light switch at the top of the stairs and the bottom of the stairs. If you do not have them, ask someone to add them for you. ?What else can I do to help prevent falls? ?Wear shoes that: ?Do not have high heels. ?Have rubber bottoms. ?Are comfortable and fit you well. ?Are closed at the toe. Do not wear sandals. ?If you use a stepladder: ?Make sure that it is fully opened. Do not climb a closed stepladder. ?Make sure that both sides of the stepladder  are locked into place. ?Ask someone to hold it for you, if possible. ?Clearly mark and make sure that you can see: ?Any grab bars or handrails. ?First and last steps. ?Where the edge of each step is. ?Use too

## 2022-01-07 NOTE — Progress Notes (Signed)
?I connected with James [name] today by telephone and verified that I am speaking with the correct person using two identifiers. ?Location patient: home ?Location provider: work ?Persons participating in the virtual visit: patient, provider. ?  ?I discussed the limitations, risks, security and privacy concerns of performing an evaluation and management service by telephone and the availability of in person appointments. I also discussed with the patient that there may be a patient responsible charge related to this service. The patient expressed understanding and verbally consented to this telephonic visit.  ?  ?Interactive audio and video telecommunications were attempted between this provider and patient, however failed, due to patient having technical difficulties OR patient did not have access to video capability.  We continued and completed visit with audio only. ? ?Some vital signs may be absent or patient reported.  ? ?Time Spent with patient on telephone encounter: 40 minutes ? ?Subjective:  ? James [name] is a 76 y.o. male who presents for Medicare Annual/Subsequent preventive examination. ? ?Review of Systems    ? ?Cardiac Risk Factors include: advanced age (>63mn, >>64women);dyslipidemia;family history of premature cardiovascular disease;male gender ? ?   ?Objective:  ?  ?There were no vitals filed for this visit. ?There is no height or weight on file to calculate BMI. ? ? ?  01/07/2022  ?  8:49 AM 03/18/2021  ?  8:56 AM 10/11/2020  ?  8:45 AM 01/12/2020  ? 11:10 AM 07/13/2019  ?  8:57 AM 04/12/2019  ? 10:04 AM 06/29/2018  ?  8:14 AM  ?Advanced Directives  ?Does Patient Have a Medical Advance Directive? Yes No Yes Yes Yes Yes Yes  ?Type of Advance Directive Living will;Healthcare Power of [place] will [place]Living will [place]Living will [place]allplayLiving will [place]Living will  ?Does patient  want to make changes to medical advance directive? No - Patient declined  No - Patient declined No - Patient declined  No - Patient declined   ?Copy of [place]in Chart? No - copy requested  No - copy requested No - copy requested No - copy requested No - copy requested No - copy requested  ?Would patient like information on creating a medical advance directive?  No - Patient declined       ? ? ?Current Medications (verified) ?Outpatient Encounter Medications as of 01/07/2022  ?Medication Sig  ? acetaminophen (TYLENOL) 500 MG tablet Take 1,000 mg by mouth every 6 (six) hours as needed for mild pain.   ? Cholecalciferol (D3-1000 PO) Take by mouth daily.  ? Multiple Vitamin (MULTIVITAMIN WITH MINERALS) TABS tablet Take 1 tablet by mouth every other day.   ? omeprazole (PRILOSEC) 40 MG capsule TAKE 1 CAPSULE DAILY 30 MINS BEFORE BREAKFAST OR SUPPER, OKAY TO OPEN AND SPRINKLE ON APPLESAUCE  ? pravastatin (PRAVACHOL) 40 MG tablet TAKE 1 TABLET BY MOUTH EVERYDAY AT BEDTIME  ? sildenafil (REVATIO) 20 MG tablet SMARTSIG:2-5 Tablet(s) By Mouth PRN  ? vitamin B-12 (CYANOCOBALAMIN) 1000 MCG tablet 2,000 mcg daily.   ? [DISCONTINUED] mupirocin cream (BACTROBAN) 2 % Apply 1 application topically 2 (two) times daily.  ? [DISCONTINUED] sulfamethoxazole-trimethoprim (BACTRIM DS) 800-160 MG tablet Take 1 tablet by mouth 2 (two) times daily.  ? ?Facility-Administered Encounter Medications as of 01/07/2022  ?Medication  ? sodium chloride flush (NS) 0.9 % injection 10 mL  ? ? ?Allergies (verified) ?Codeine  ? ?History: ?Past Medical History:  ?  Diagnosis Date  ? Abnormal EKG   ? NS ST-T EKG Changes, (-) Nuclear Stress Test 03/2006  ? Allergy   ? Anemia   ? iron deficency  ? Arthritis   ? B12 deficiency 06/05/2015  ? B12 Low NL at 271 methtylmalonic acid high  ? Cardiac murmur   ? nothing to be concerned with   ? Cataract   ? extractions  ? Duodenal ulcer 1972  ? transfusion required, 1 unit packed cells  ? Dysphagia   ?  history throat/stomach  cancer "can swallow some, uses more pureed type foods"  ? Dyspnea   ? at times  ? Esophageal reflux   ? no currlently  ? 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 Gastrostomy tube in place [facility]) 2016  ? intermittent feeding  ? Hx of basal cell carcinoma   ? Hyperlipemia   ? no per pt 04-11-16  ? Melanoma ([place])   ? Prostate cancer ([place]) 09/06/14  ? last radiation 4'16   ? S/P radiation therapy 11/29/2014 through 01/23/2015                                                    ? Prostate 7800 cGy in 40 sessions, seminal vesicles 5600 cGy in 40 sessions                       ? Skin cancer, basal cell   ? Transfusion history   ? '72 -s/p surgery for duodenal ulcer, 12/17  ? ?Past Surgical History:  ?Procedure Laterality Date  ? BALLOON DILATION N/A 09/12/2015  ? Procedure: BALLOON DILATION;  Surgeon: Gatha Mayer, MD;  Location: [facility];  Service: Endoscopy;  Laterality: N/A;  ? BALLOON DILATION N/A 09/21/2015  ? Procedure: BALLOON DILATION;  Surgeon: Gatha Mayer, MD;  Location: [facility];  Service: Endoscopy;  Laterality: N/A;  ? BALLOON DILATION N/A 10/04/2015  ? Procedure: BALLOON DILATION;  Surgeon: Milus Banister, MD;  Location: [facility];  Service: Endoscopy;  Laterality: N/A;  ? BALLOON DILATION N/A 12/10/2015  ? Procedure: BALLOON DILATION;  Surgeon: Gatha Mayer, MD;  Location: [facility];  Service: Endoscopy;  Laterality: N/A;  ? BALLOON DILATION N/A 10/10/2016  ? Procedure: BALLOON DILATION;  Surgeon: Gatha Mayer, MD;  Location: [facility];  Service: Endoscopy;  Laterality: N/A;  ? CATARACT EXTRACTION, BILATERAL Bilateral   ? COLONOSCOPY    ? colonoscopy with polypectomy  02/2012  ? Dr Sharlett Iles  ? ESOPHAGOGASTRODUODENOSCOPY (EGD) WITH PROPOFOL N/A 08/17/2015  ? Procedure: ESOPHAGOGASTRODUODENOSCOPY (EGD) WITH PROPOFOL;  Surgeon: Gatha Mayer, MD;  Location: [place];  Service: Endoscopy;  Laterality: N/A;  ? ESOPHAGOGASTRODUODENOSCOPY (EGD) WITH  PROPOFOL N/A 09/12/2015  ? Procedure: ESOPHAGOGASTRODUODENOSCOPY (EGD) WITH PROPOFOL;  Surgeon: Gatha Mayer, MD;  Location: [facility];  Service: Endoscopy;  Laterality: N/A;  ? ESOPHAGOGASTRODUODENOSCOPY (EGD) WITH PROPOFOL N/A 09/21/2015  ? Procedure: ESOPHAGOGASTRODUODENOSCOPY (EGD) WITH PROPOFOL;  Surgeon: Gatha Mayer, MD;  Location: [facility];  Service: Endoscopy;  Laterality: N/A;  ? ESOPHAGOGASTRODUODENOSCOPY (EGD) WITH PROPOFOL N/A 10/04/2015  ? Procedure: ESOPHAGOGASTRODUODENOSCOPY (EGD) WITH PROPOFOL;  Surgeon: Milus Banister, MD;  Location: [facility];  Service: Endoscopy;  Laterality: N/A;  possible stricture  ? ESOPHAGOGASTRODUODENOSCOPY (EGD) WITH PROPOFOL N/A 12/10/2015  ? Procedure: ESOPHAGOGASTRODUODENOSCOPY (EGD) WITH PROPOFOL;  Surgeon: Gatha Mayer, MD;  Location: [facility];  Service: Endoscopy;  Laterality: N/A;  ? ESOPHAGOGASTRODUODENOSCOPY (EGD) WITH PROPOFOL N/A 02/06/2016  ? Procedure: ESOPHAGOGASTRODUODENOSCOPY (EGD) WITH PROPOFOL;  Surgeon: Gatha Mayer, MD;  [facility];  Service: Endoscopy;  Laterality: N/A;  ? ESOPHAGOGASTRODUODENOSCOPY (EGD) WITH PROPOFOL N/A 10/10/2016  ? Procedure: ESOPHAGOGASTRODUODENOSCOPY (EGD) WITH PROPOFOL;  Surgeon: Gatha Mayer, MD;  Location: [facility];  Service: Endoscopy;  Laterality: N/A;  needs APC ?  ? EUS N/A 06/14/2015  ? Procedure: ESOPHAGEAL ENDOSCOPIC ULTRASOUND (EUS) RADIAL;  Surgeon: Milus Banister, MD;  Location: [facility];  Service: Endoscopy;  Laterality: N/A;  ? HOT HEMOSTASIS N/A 10/10/2016  ? Procedure: HOT HEMOSTASIS (ARGON PLASMA COAGULATION/BICAP);  Surgeon: Gatha Mayer, MD;  Location: [facility];  Service: Endoscopy;  Laterality: N/A;  ? KNEE ARTHROSCOPY Right   ? Right Knee, [place] [facility]  ? LAPAROSCOPIC GASTRECTOMY N/A 07/18/2015  ? Procedure: SUB TOTAL GASTRECTOMY;  Surgeon: Stark Klein, MD;  Location: [facility];  Service: General;  Laterality: N/A;  ? LAPAROSCOPIC GASTROSTOMY N/A 07/18/2015  ? Procedure:  FEEDING TUBE PLACEMENT;  Surgeon: Stark Klein, MD;  Location: [facility];  Service: General;  Laterality: N/A;  ? LAPAROSCOPY N/A 07/18/2015  ? Procedure: LAPAROSCOPY DIAGNOSTIC;  Surgeon: Stark Klein,

## 2022-01-20 ENCOUNTER — Ambulatory Visit: Payer: Medicare Other

## 2022-01-20 ENCOUNTER — Ambulatory Visit (INDEPENDENT_AMBULATORY_CARE_PROVIDER_SITE_OTHER): Payer: Medicare Other

## 2022-01-20 DIAGNOSIS — E538 Deficiency of other specified B group vitamins: Secondary | ICD-10-CM

## 2022-01-20 MED ORDER — CYANOCOBALAMIN 1000 MCG/ML IJ SOLN
1000.0000 ug | Freq: Once | INTRAMUSCULAR | Status: AC
  Administered 2022-01-20: 1000 ug via INTRAMUSCULAR

## 2022-01-20 NOTE — Progress Notes (Signed)
Pt here for monthly B12 injection per Dr. Quay Burow ? ?B12 1064mg given IM, and pt tolerated injection well. ? ?Next B12 injection scheduled for 02/19/22 ?

## 2022-01-28 DIAGNOSIS — M545 Low back pain, unspecified: Secondary | ICD-10-CM | POA: Diagnosis not present

## 2022-01-29 DIAGNOSIS — Z85828 Personal history of other malignant neoplasm of skin: Secondary | ICD-10-CM | POA: Diagnosis not present

## 2022-01-29 DIAGNOSIS — C4401 Basal cell carcinoma of skin of lip: Secondary | ICD-10-CM | POA: Diagnosis not present

## 2022-01-29 DIAGNOSIS — Z85820 Personal history of malignant melanoma of skin: Secondary | ICD-10-CM | POA: Diagnosis not present

## 2022-02-19 ENCOUNTER — Ambulatory Visit (INDEPENDENT_AMBULATORY_CARE_PROVIDER_SITE_OTHER): Payer: Medicare Other

## 2022-02-19 DIAGNOSIS — E538 Deficiency of other specified B group vitamins: Secondary | ICD-10-CM

## 2022-02-19 MED ORDER — CYANOCOBALAMIN 1000 MCG/ML IJ SOLN
1000.0000 ug | Freq: Once | INTRAMUSCULAR | Status: AC
  Administered 2022-02-19: 1000 ug via INTRAMUSCULAR

## 2022-02-19 NOTE — Progress Notes (Signed)
Pt came into the office today for his b12 injection. He tolerated his injection well. No questions or concerns. [name] appt has been scheduled, pt has been made of aware of appt date and time.  ?

## 2022-03-13 ENCOUNTER — Other Ambulatory Visit: Payer: Self-pay | Admitting: Internal Medicine

## 2022-03-21 ENCOUNTER — Ambulatory Visit (INDEPENDENT_AMBULATORY_CARE_PROVIDER_SITE_OTHER): Payer: Medicare Other

## 2022-03-21 DIAGNOSIS — E538 Deficiency of other specified B group vitamins: Secondary | ICD-10-CM | POA: Diagnosis not present

## 2022-03-21 MED ORDER — CYANOCOBALAMIN 1000 MCG/ML IJ SOLN
1000.0000 ug | Freq: Once | INTRAMUSCULAR | Status: AC
  Administered 2022-03-21: 1000 ug via INTRAMUSCULAR

## 2022-03-21 NOTE — Progress Notes (Signed)
After obtaining consent, and per orders of Dr.Burns, injection of B12 given by Lasandra Batley. Patient tolerated injection well in right deltoid and instructed to report any adverse reaction to me immediately.  

## 2022-04-07 ENCOUNTER — Other Ambulatory Visit: Payer: Self-pay | Admitting: Internal Medicine

## 2022-04-14 ENCOUNTER — Other Ambulatory Visit: Payer: Self-pay | Admitting: Internal Medicine

## 2022-04-14 DIAGNOSIS — E782 Mixed hyperlipidemia: Secondary | ICD-10-CM

## 2022-04-21 DIAGNOSIS — Z85820 Personal history of malignant melanoma of skin: Secondary | ICD-10-CM | POA: Diagnosis not present

## 2022-04-21 DIAGNOSIS — L814 Other melanin hyperpigmentation: Secondary | ICD-10-CM | POA: Diagnosis not present

## 2022-04-21 DIAGNOSIS — Z85828 Personal history of other malignant neoplasm of skin: Secondary | ICD-10-CM | POA: Diagnosis not present

## 2022-04-21 DIAGNOSIS — L570 Actinic keratosis: Secondary | ICD-10-CM | POA: Diagnosis not present

## 2022-04-21 DIAGNOSIS — D2239 Melanocytic nevi of other parts of face: Secondary | ICD-10-CM | POA: Diagnosis not present

## 2022-04-21 DIAGNOSIS — L720 Epidermal cyst: Secondary | ICD-10-CM | POA: Diagnosis not present

## 2022-04-21 DIAGNOSIS — D1801 Hemangioma of skin and subcutaneous tissue: Secondary | ICD-10-CM | POA: Diagnosis not present

## 2022-04-21 DIAGNOSIS — D2262 Melanocytic nevi of left upper limb, including shoulder: Secondary | ICD-10-CM | POA: Diagnosis not present

## 2022-04-21 DIAGNOSIS — L821 Other seborrheic keratosis: Secondary | ICD-10-CM | POA: Diagnosis not present

## 2022-04-21 DIAGNOSIS — D485 Neoplasm of uncertain behavior of skin: Secondary | ICD-10-CM | POA: Diagnosis not present

## 2022-04-23 ENCOUNTER — Ambulatory Visit: Payer: Medicare Other

## 2022-04-30 ENCOUNTER — Ambulatory Visit (INDEPENDENT_AMBULATORY_CARE_PROVIDER_SITE_OTHER): Payer: Medicare Other | Admitting: *Deleted

## 2022-04-30 DIAGNOSIS — E538 Deficiency of other specified B group vitamins: Secondary | ICD-10-CM

## 2022-04-30 MED ORDER — CYANOCOBALAMIN 1000 MCG/ML IJ SOLN
1000.0000 ug | Freq: Once | INTRAMUSCULAR | Status: AC
  Administered 2022-04-30: 1000 ug via INTRAMUSCULAR

## 2022-04-30 NOTE — Progress Notes (Signed)
Patient was here for his b12 injection. Given in right deltoid. Patient tolerated well    Please co sign

## 2022-05-07 ENCOUNTER — Other Ambulatory Visit: Payer: Self-pay | Admitting: Internal Medicine

## 2022-05-21 ENCOUNTER — Other Ambulatory Visit: Payer: Self-pay | Admitting: Internal Medicine

## 2022-06-04 ENCOUNTER — Ambulatory Visit (INDEPENDENT_AMBULATORY_CARE_PROVIDER_SITE_OTHER): Payer: Medicare Other

## 2022-06-04 DIAGNOSIS — E538 Deficiency of other specified B group vitamins: Secondary | ICD-10-CM | POA: Diagnosis not present

## 2022-06-04 MED ORDER — CYANOCOBALAMIN 1000 MCG/ML IJ SOLN
1000.0000 ug | Freq: Once | INTRAMUSCULAR | Status: AC
  Administered 2022-06-04: 1000 ug via INTRAMUSCULAR

## 2022-06-04 NOTE — Progress Notes (Signed)
B12 given Please cosign 

## 2022-06-11 ENCOUNTER — Ambulatory Visit (INDEPENDENT_AMBULATORY_CARE_PROVIDER_SITE_OTHER): Payer: Medicare Other | Admitting: Internal Medicine

## 2022-06-11 ENCOUNTER — Encounter: Payer: Self-pay | Admitting: Internal Medicine

## 2022-06-11 VITALS — BP 102/68 | HR 64 | Ht 66.0 in | Wt 151.4 lb

## 2022-06-11 DIAGNOSIS — R1319 Other dysphagia: Secondary | ICD-10-CM

## 2022-06-11 DIAGNOSIS — R1011 Right upper quadrant pain: Secondary | ICD-10-CM

## 2022-06-11 DIAGNOSIS — Z86010 Personal history of colonic polyps: Secondary | ICD-10-CM | POA: Diagnosis not present

## 2022-06-11 DIAGNOSIS — K929 Disease of digestive system, unspecified: Secondary | ICD-10-CM | POA: Diagnosis not present

## 2022-06-11 DIAGNOSIS — K3189 Other diseases of stomach and duodenum: Secondary | ICD-10-CM | POA: Diagnosis not present

## 2022-06-11 NOTE — Patient Instructions (Signed)
You have been scheduled for an endoscopy. Please follow written instructions given to you at your visit today. If you use inhalers (even only as needed), please bring them with you on the day of your procedure.   _______________________________________________________  If you are age 77 or older, your body mass index should be between 23-30. Your Body mass index is 24.44 kg/m. If this is out of the aforementioned range listed, please consider follow up with your Primary Care Provider.  If you are age 63 or younger, your body mass index should be between 19-25. Your Body mass index is 24.44 kg/m. If this is out of the aformentioned range listed, please consider follow up with your Primary Care Provider.   ________________________________________________________  The [facility] providers would like to encourage you to use [facility] to communicate with providers for non-urgent requests or questions.  Due to long hold times on the telephone, sending your provider a message by [facility] may be a faster and more efficient way to get a response.  Please allow 48 business hours for a response.  Please remember that this is for non-urgent requests.  _______________________________________________________  I appreciate the opportunity to care for you. Silvano Rusk, MD, [facility]

## 2022-06-11 NOTE — Progress Notes (Signed)
James [name] 76 y.o. 1946-07-02 [id number]  Assessment & Plan:   Encounter Diagnoses  Name Primary?   Esophageal dysphagia Yes   Anastomotic stricture of stomach    Abdominal wall pain in right upper quadrant    History of colonic polyps    The plan is to repeat EGD with likely dilation of his strictured area to treat dysphagia.  Some of his issue could be reduced volume of the stomach as well.  Briefly discussed reducing volume of the meals.  We will revisit pending EGD.  The pain in his abdomen is abdominal wall pain.  Could be from vomiting that he has at times or physical activity.  He is reassured about this.  If it becomes a more difficult issue consider referral to a partner for abdominal wall injection.  He prefers not to have surveillance colonoscopy.  I think that is reasonable at his age.  I appreciate the opportunity to care for this patient. 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 and resected 2016 subtotal gastrectomy with placement of jejunostomy tube T2N0 tumor, adjuvant 5-FU/leucovorin and XRT with Xeloda 2016-2017  -Recurrent gastroesophageal anastomotic stricture requiring numerous dilations since early postop  -History of colon polyps last colonoscopy 1 adenoma 2018, 2 adenomas 2013 and prior polyps   HPI 76 year old white man with a history of severe postgastrectomy and chemo-radiotherapy (gastric cancer) GE junction stricture with numerous dilations in the past (last procedure 02/26/2021, greater than 16 total), additional history of prostate cancer, melanoma and multiple basal cell carcinomas.  He was last seen on Feb 26, 2021, and I dilated his esophageal stricture to 20 mm and he reported that provided relief of dysphagia and regurgitation problems.  He has some chronic early satiety issues with his diminished gastric volume.   He is reporting he has had recurrent problems with dysphagia-like symptoms and regurgitation/vomiting of foods.  Though he avoids dense meats this still may be a problem at times about 6-8 times a month.  He thinks it is time for another dilation procedure.  He continues on daily 40 mg omeprazole. We reviewed his history of colon polyps and his current age, and whether or not to repeat a colonoscopy.  He prefers not to.  He is not describing bowel habit issues.  He is also reporting some right-sided abdominal pain and increased "stomach aches"..  He is active with tennis and golf and overall indicates life is good.  Still goes to his condominium at [employer] at [facility] intermittently also.   Wt Readings from Last 3 Encounters:  06/11/22 151 lb 6.4 oz (68.7 kg)  07/11/21 154 lb 6.4 oz (70 kg)  06/12/21 150 lb 3.2 oz (68.1 kg)    EGD 02/26/2021  - Benign-appearing esophageal stenosis. Dilated.  To 20 mm - A proximal hemi-gastrectomy were found, characterized by healthy appearing mucosa. - Normal examined duodenum. - No specimens collected.  Colonoscopy 07/18/2017 - Two diminutive polyps in the rectum and in the cecum, removed with a cold snare. Resected and retrieved.  1 adenoma 1 hyperplastic - Diverticulosis in the sigmoid colon. - Increased mucosa vascular pattern in the rectum. - The examination was otherwise normal on direct and retroflexion views.  2 adenomas in 2013 and polyps prior to that as well  Allergies  Allergen Reactions   Codeine Nausea And Vomiting   Current Meds  Medication Sig   acetaminophen (TYLENOL) 500 MG tablet Take 1,000 mg by mouth every 6 (six) hours as needed for mild pain.    Multiple Vitamin (MULTIVITAMIN WITH MINERALS) TABS tablet Take 1 tablet by mouth every other day.    omeprazole (PRILOSEC) 40 MG capsule TAKE 1 CAPSULE DAILY 30 MINS BEFORE BREAKFAST OR SUPPER, OKAY TO OPEN AND SPRINKLE ON APPLESAUCE   pravastatin (PRAVACHOL) 40 MG tablet  TAKE 1 TABLET BY MOUTH EVERYDAY AT BEDTIME   vitamin B-12 (CYANOCOBALAMIN) 1000 MCG tablet 2,000 mcg daily.    Past Medical History:  Diagnosis Date   Abnormal EKG    NS ST-T EKG Changes, (-) Nuclear Stress Test 03/2006   Allergy    Anemia    iron deficency   Arthritis    B12 deficiency 06/05/2015   B12 Low NL at 271 methtylmalonic acid high   Cardiac murmur    nothing to be concerned with    Cataract    extractions   Duodenal ulcer 1972   transfusion required, 1 unit packed cells   Dupuytren's contracture    Dysphagia    history throat/stomach  cancer "can swallow some, uses more pureed type foods"   Dyspnea    at times   Esophageal reflux    no currlently   Gastric cancer ([place]) 07/18/2015   invasive adenocarcinoma   Gastrostomy tube in place [facility]) 2016   intermittent feeding   Hx of basal cell carcinoma    Hyperlipemia    no per pt 04-11-16   Melanoma ([place])    Prostate cancer ([place]) 09/06/2014   last radiation 4'16    S/P radiation therapy 11/29/2014 through 01/23/2015                                                     Prostate 7800 cGy in 40 sessions, seminal vesicles 5600 cGy in 40 sessions                        Skin cancer, basal cell    Transfusion history    '72 -s/p surgery for duodenal ulcer, 12/17   Past Surgical History:  Procedure Laterality Date   BALLOON DILATION N/A 09/12/2015   Procedure: BALLOON DILATION;  Surgeon: Gatha Mayer, MD;  Location: [facility];  Service: Endoscopy;  Laterality: N/A;   BALLOON DILATION N/A 09/21/2015   Procedure: BALLOON DILATION;  Surgeon: Gatha Mayer, MD;  Location: [facility];  Service: Endoscopy;  Laterality: N/A;   BALLOON DILATION N/A 10/04/2015   Procedure: BALLOON DILATION;  Surgeon: Milus Banister, MD;  Location: [facility];  Service: Endoscopy;  Laterality: N/A;   BALLOON DILATION N/A 12/10/2015   Procedure: BALLOON DILATION;  Surgeon: Gatha Mayer, MD;  [facility]: [facility];  Service: Endoscopy;   Laterality: N/A;   BALLOON DILATION N/A 10/10/2016   Procedure: BALLOON DILATION;  Surgeon: Gatha Mayer, MD;  [facility]: [facility];  Service: Endoscopy;  Laterality: N/A;   CATARACT EXTRACTION, BILATERAL Bilateral    COLONOSCOPY     colonoscopy with polypectomy  02/2012   Dr Sharlett Iles   ESOPHAGOGASTRODUODENOSCOPY (EGD) WITH PROPOFOL N/A 08/17/2015   Procedure: ESOPHAGOGASTRODUODENOSCOPY (EGD) WITH PROPOFOL;  Surgeon: Gatha Mayer, MD;  Location: [place];  Service: Endoscopy;  Laterality: N/A;   ESOPHAGOGASTRODUODENOSCOPY (EGD) WITH PROPOFOL N/A  09/12/2015   Procedure: ESOPHAGOGASTRODUODENOSCOPY (EGD) WITH PROPOFOL;  Surgeon: Gatha Mayer, MD;  [facility];  Service: Endoscopy;  Laterality: N/A;   ESOPHAGOGASTRODUODENOSCOPY (EGD) WITH PROPOFOL N/A 09/21/2015   Procedure: ESOPHAGOGASTRODUODENOSCOPY (EGD) WITH PROPOFOL;  Surgeon: Gatha Mayer, MD;  [facility];  Service: Endoscopy;  Laterality: N/A;   ESOPHAGOGASTRODUODENOSCOPY (EGD) WITH PROPOFOL N/A 10/04/2015   Procedure: ESOPHAGOGASTRODUODENOSCOPY (EGD) WITH PROPOFOL;  Surgeon: Milus Banister, MD;  [facility];  Service: Endoscopy;  Laterality: N/A;  possible stricture   ESOPHAGOGASTRODUODENOSCOPY (EGD) WITH PROPOFOL N/A 12/10/2015   Procedure: ESOPHAGOGASTRODUODENOSCOPY (EGD) WITH PROPOFOL;  Surgeon: Gatha Mayer, MD;  [facility];  Service: Endoscopy;  Laterality: N/A;   ESOPHAGOGASTRODUODENOSCOPY (EGD) WITH PROPOFOL N/A 02/06/2016   Procedure: ESOPHAGOGASTRODUODENOSCOPY (EGD) WITH PROPOFOL;  Surgeon: Gatha Mayer, MD;  [facility];  Service: Endoscopy;  Laterality: N/A;   ESOPHAGOGASTRODUODENOSCOPY (EGD) WITH PROPOFOL N/A 10/10/2016   Procedure: ESOPHAGOGASTRODUODENOSCOPY (EGD) WITH PROPOFOL;  Surgeon: Gatha Mayer, MD;  [facility];  Service: Endoscopy;  Laterality: N/A;  needs APC    EUS N/A 06/14/2015   Procedure: ESOPHAGEAL ENDOSCOPIC ULTRASOUND (EUS) RADIAL;   Surgeon: Milus Banister, MD;  [facility];  Service: Endoscopy;  Laterality: N/A;   HOT HEMOSTASIS N/A 10/10/2016   Procedure: HOT HEMOSTASIS (ARGON PLASMA COAGULATION/BICAP);  Surgeon: Gatha Mayer, MD;  [facility];  Service: Endoscopy;  Laterality: N/A;   KNEE ARTHROSCOPY Right    Right Knee, [place] [facility]   LAPAROSCOPIC GASTRECTOMY N/A 07/18/2015   Procedure: SUB TOTAL GASTRECTOMY;  Surgeon: Stark Klein, MD;  [facility];  Service: General;  Laterality: N/A;   LAPAROSCOPIC GASTROSTOMY N/A 07/18/2015   Procedure: FEEDING TUBE PLACEMENT;  Surgeon: Stark Klein, MD;  Location: [facility];  Service: General;  Laterality: N/A;   LAPAROSCOPY N/A 07/18/2015   Procedure: LAPAROSCOPY DIAGNOSTIC;  Surgeon: Stark Klein, MD;  [facility];  Service: General;  Laterality: N/A;   MELANOMA EXCISION  02-01-16   abdominal wall   MOHS SURGERY  05/2020   nose   PROSTATE BIOPSY  09/06/14   ROTATOR CUFF REPAIR Right    R shoulder, [place]   SAVORY DILATION N/A 02/06/2016   Procedure: SAVORY DILATION;  Surgeon: Gatha Mayer, MD;  [facility];  Service: Endoscopy;  Laterality: N/A;   TOTAL SHOULDER ARTHROPLASTY Left 05/07/2017   Procedure: LEFT REVERSE TOTAL SHOULDER ARTHROPLASTY;  Surgeon: Justice Britain, MD;  Location: [place];  Service: Orthopedics;  Laterality: Left;   TRIGGER FINGER RELEASE Left 1998   UPPER GASTROINTESTINAL ENDOSCOPY     x14   VASECTOMY     Social History   Social History Narrative   Married and retired - 1 son and 1 daughter    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  Second home at [facility], [employer] condominium   Very occasional alcohol, former smoker   family history includes Alcohol abuse in his mother; Cirrhosis in his mother; Diabetes in his father and sister; Heart attack (age of onset: 29) in his father; Liver disease in his father; Multiple myeloma in his brother.   Review of Systems As per  HPI  Objective:   Physical Exam BP 102/68   Pulse 64   Ht _0  (1.676 m)   Wt 151 lb 6.4 oz (68.7 kg)   SpO2 95%   BMI 24.44 kg/m  Thin well-developed well-nourished white man no acute distress Lungs clear to auscultation  with slightly decreased breath sounds throughout Heart sounds normal S1-S2 no rubs murmurs or gallops Abdomen is thin soft and mildly tender in the right upper quadrant with a positive Carnett's sign.  There is a surgical scar present.  I do not detect any hernia.  No organomegaly or masses. He is alert and oriented x3.

## 2022-07-03 ENCOUNTER — Ambulatory Visit (INDEPENDENT_AMBULATORY_CARE_PROVIDER_SITE_OTHER): Payer: Medicare Other

## 2022-07-03 DIAGNOSIS — E538 Deficiency of other specified B group vitamins: Secondary | ICD-10-CM

## 2022-07-03 MED ORDER — CYANOCOBALAMIN 1000 MCG/ML IJ SOLN
1000.0000 ug | Freq: Once | INTRAMUSCULAR | Status: AC
  Administered 2022-07-03: 1000 ug via INTRAMUSCULAR

## 2022-07-03 NOTE — Progress Notes (Signed)
After obtaining consent, and per orders of Dr. Burns, injection of B12 given in the left deltoid by Rossi Silvestro P Tally Mckinnon. Patient instructed to report any adverse reaction to me immediately.  

## 2022-07-10 ENCOUNTER — Other Ambulatory Visit (HOSPITAL_BASED_OUTPATIENT_CLINIC_OR_DEPARTMENT_OTHER): Payer: Self-pay

## 2022-07-10 ENCOUNTER — Inpatient Hospital Stay: Payer: Medicare Other | Attending: Oncology

## 2022-07-10 ENCOUNTER — Inpatient Hospital Stay (HOSPITAL_BASED_OUTPATIENT_CLINIC_OR_DEPARTMENT_OTHER): Payer: Medicare Other | Admitting: Oncology

## 2022-07-10 VITALS — BP 132/81 | HR 69 | Temp 98.2°F | Resp 18 | Ht 66.0 in | Wt 150.6 lb

## 2022-07-10 DIAGNOSIS — E538 Deficiency of other specified B group vitamins: Secondary | ICD-10-CM | POA: Insufficient documentation

## 2022-07-10 DIAGNOSIS — D509 Iron deficiency anemia, unspecified: Secondary | ICD-10-CM | POA: Insufficient documentation

## 2022-07-10 DIAGNOSIS — Z85820 Personal history of malignant melanoma of skin: Secondary | ICD-10-CM | POA: Insufficient documentation

## 2022-07-10 DIAGNOSIS — D701 Agranulocytosis secondary to cancer chemotherapy: Secondary | ICD-10-CM | POA: Diagnosis not present

## 2022-07-10 DIAGNOSIS — C165 Malignant neoplasm of lesser curvature of stomach, unspecified: Secondary | ICD-10-CM

## 2022-07-10 DIAGNOSIS — D6959 Other secondary thrombocytopenia: Secondary | ICD-10-CM | POA: Insufficient documentation

## 2022-07-10 DIAGNOSIS — C169 Malignant neoplasm of stomach, unspecified: Secondary | ICD-10-CM | POA: Insufficient documentation

## 2022-07-10 DIAGNOSIS — Z923 Personal history of irradiation: Secondary | ICD-10-CM | POA: Diagnosis not present

## 2022-07-10 DIAGNOSIS — Z23 Encounter for immunization: Secondary | ICD-10-CM | POA: Diagnosis not present

## 2022-07-10 LAB — CBC WITH DIFFERENTIAL (CANCER CENTER ONLY)
Abs Immature Granulocytes: 0.01 10*3/uL (ref 0.00–0.07)
Basophils Absolute: 0.1 10*3/uL (ref 0.0–0.1)
Basophils Relative: 1 %
Eosinophils Absolute: 0.4 10*3/uL (ref 0.0–0.5)
Eosinophils Relative: 7 %
HCT: 39.3 % (ref 39.0–52.0)
Hemoglobin: 13.1 g/dL (ref 13.0–17.0)
Immature Granulocytes: 0 %
Lymphocytes Relative: 36 %
Lymphs Abs: 2 10*3/uL (ref 0.7–4.0)
MCH: 30 pg (ref 26.0–34.0)
MCHC: 33.3 g/dL (ref 30.0–36.0)
MCV: 90.1 fL (ref 80.0–100.0)
Monocytes Absolute: 0.6 10*3/uL (ref 0.1–1.0)
Monocytes Relative: 11 %
Neutro Abs: 2.5 10*3/uL (ref 1.7–7.7)
Neutrophils Relative %: 45 %
Platelet Count: 204 10*3/uL (ref 150–400)
RBC: 4.36 MIL/uL (ref 4.22–5.81)
RDW: 15.7 % — ABNORMAL HIGH (ref 11.5–15.5)
WBC Count: 5.4 10*3/uL (ref 4.0–10.5)
nRBC: 0 % (ref 0.0–0.2)

## 2022-07-10 MED ORDER — INFLUENZA VAC A&B SA ADJ QUAD 0.5 ML IM PRSY
PREFILLED_SYRINGE | INTRAMUSCULAR | 0 refills | Status: DC
  Filled 2022-07-10: qty 0.5, 1d supply, fill #0

## 2022-07-10 NOTE — Progress Notes (Signed)
[facility] OFFICE PROGRESS NOTE   Diagnosis: Gastric cancer  INTERVAL HISTORY:   [name] returns as scheduled.  He feels well.  He is playing golf and tennis.  He reports increased dysphagia and early satiety over the past month.  He is scheduled for a dilation procedure this month.  Objective:  Vital signs in last 24 hours:  Blood pressure 132/81, pulse 69, temperature 98.2 F (36.8 C), temperature source Oral, resp. rate 18, height '5\' 6"'$  (1.676 m), weight 150 lb 9.6 oz (68.3 kg), SpO2 100 %.     Lymphatics: No cervical, supraclavicular, or inguinal nodes.  "Shotty "bilateral axillary nodes Resp: End inspiratory rhonchi at the left upper posterior chest, no respiratory distress Cardio: Regular rate and rhythm GI: No hepatosplenomegaly, no mass, nontender Vascular: No leg edema   Lab Results:  Lab Results  Component Value Date   WBC 5.3 07/11/2021   HGB 13.4 07/11/2021   HCT 40.0 07/11/2021   MCV 94.8 07/11/2021   PLT 163 07/11/2021   NEUTROABS 2.6 07/11/2021    CMP  Lab Results  Component Value Date   NA 142 04/18/2020   K 4.3 04/18/2020   CL 106 04/18/2020   CO2 28 04/18/2020   GLUCOSE 91 04/18/2020   BUN 14 04/18/2020   CREATININE 0.90 04/18/2020   CALCIUM 8.8 04/18/2020   PROT 6.4 02/02/2020   ALBUMIN 4.1 02/02/2020   AST 25 02/02/2020   ALT 10 02/02/2020   ALKPHOS 58 02/02/2020   BILITOT 0.6 02/02/2020   GFRNONAA 83 04/18/2020   GFRAA 96 04/18/2020    Medications: I have reviewed the patient's current medications.   Assessment/Plan: Gastric cancer-adenocarcinoma of the gastric fundus/cardia Staging CT scans 06/07/2015 with no evidence of distant metastatic disease, tiny nonspecific pulmonary nodules and liver lesions-likely benign Subtotal gastrectomy and placement of a jejunostomy feeding tube 07/18/2015 for a T2,N0 tumor with negative surgical margins Initiation of adjuvant 5-FU/leucovorin 08/28/2015, status post 3 weekly  treatments completed 09/13/2015 Initiation of radiation and concurrent Xeloda 10/29/2015 11/26/2015 Xeloda dose reduced to 1000 mg twice daily due to progressive thrombocytopenia Radiation completed 12/06/2015 5-FU/leucovorin resumed 12/21/2015       History of microcytic anemia-likely iron deficiency anemia secondary to #1 Progressive anemia 09/16/2016-secondary to 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 Upper endoscopy 10/02/2016 with findings of multiple small bleeding angioectasias status post argon plasma coagulation on 10/10/2016   3.    Vitamin B-12 deficiency-Low-level confirmed 01/04/2016 and again 07/09/2018, maintained on vitamin B12 injection therapy  4.   T1c prostate cancer-status post external beam radiation completed April 2016   5.    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   Anastomotic stricture-status post balloon dilatation 08/22/2015, status post electroincision dilation at [facility] 10/11/2015, repeat EGD/ dilatation by Dr. Carlean Purl 11/01/2015 and 12/10/2015; EGD 10/02/2016 with finding of esophageal stenosis at the gastroesophageal junction status post dilation 10/10/2016, 01/21/2018, 06/20/2019 Benign stenosis at the esophageal anastomosis dilated 02/26/2021   7.    Mild neutropenia/thrombocytopenia secondary to chemotherapy. Progressive thrombocytopenia 11/26/2015. Xeloda dose reduced to 1000 mg twice daily. Counts stable to improved 01/04/2016.    8.    Erythema/tenderness at the jejunostomy feeding tube site 09/20/2015, improved   9.    Feeding tube removed 01/04/2016   10.  Melanoma left lower abdominal wall status post  wide excision.     Disposition: [name] is in clinical remission from gastric cancer.  He will continue follow-up with Dr.  Gaster for management of the anastomotic stricture.  He is scheduled for an upper endoscopy this month.  He would like to continue follow-up at the Cancer center.  He will return  for an office visit in 1 year.  Betsy Coder, MD  07/10/2022  8:14 AM

## 2022-07-11 ENCOUNTER — Other Ambulatory Visit: Payer: Self-pay | Admitting: Internal Medicine

## 2022-07-11 DIAGNOSIS — E782 Mixed hyperlipidemia: Secondary | ICD-10-CM

## 2022-07-23 NOTE — Progress Notes (Unsigned)
[facility] History and Physical   Primary Care Physician:  Binnie Rail, MD   Reason for Procedure:   dysphagia  Plan:    EGD, dilate stricture of esophagus     HPI: James [name] is a 76 y.o. male w/ hx gastric cancer cured by resection and chronic recurrent GE anastomotic stricture. He has recurrent dysphagia sxs.   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) 2016   intermittent feeding   Hx of basal cell carcinoma    Hyperlipemia    no per pt 04-11-16   Melanoma ([place])    Prostate cancer ([place]) 09/06/2014   last radiation 4'16    S/P radiation therapy 11/29/2014 through 01/23/2015                                                     Prostate 7800 cGy in 40 sessions, seminal vesicles 5600 cGy in 40 sessions                        Skin cancer, basal cell    Transfusion history    '72 -s/p surgery for duodenal ulcer, 12/17    Past Surgical History:  Procedure Laterality Date   BALLOON DILATION N/A 09/12/2015   Procedure: BALLOON DILATION;  Surgeon: Gatha Mayer, MD;  [facility]: [facility];  Service: Endoscopy;  Laterality: N/A;   BALLOON DILATION N/A 09/21/2015   Procedure: BALLOON DILATION;  Surgeon: Gatha Mayer, MD;  [facility];  Service: Endoscopy;  Laterality: N/A;   BALLOON DILATION N/A 10/04/2015   Procedure: BALLOON DILATION;   Surgeon: Mil[place] Banister, MD;  [facility];  Service: Endoscopy;  Laterality: N/A;   BALLOON DILATION N/A 12/10/2015   Procedure: BALLOON DILATION;  Surgeon: Gatha Mayer, MD;  [facility];  Service: Endoscopy;  Laterality: N/A;   BALLOON DILATION N/A 10/10/2016   Procedure: BALLOON DILATION;  Surgeon: Gatha Mayer, MD;  [facility];  Service: Endoscopy;  Laterality: N/A;   CATARACT EXTRACTION, BILATERAL Bilateral    COLONOSCOPY     colonoscopy with polypectomy  02/2012   Dr Sharlett Iles   ESOPHAGOGASTRODUODENOSCOPY (EGD) WITH PROPOFOL N/A 08/17/2015   Procedure: ESOPHAGOGASTRODUODENOSCOPY (EGD) WITH PROPOFOL;  Surgeon: Gatha Mayer, MD;  Location: [place];  Service: Endoscopy;  Laterality: N/A;   ESOPHAGOGASTRODUODENOSCOPY (EGD) WITH PROPOFOL N/A 09/12/2015   Procedure: ESOPHAGOGASTRODUODENOSCOPY (EGD) WITH PROPOFOL;  Surgeon: Gatha Mayer, MD;  [facility];  Service: Endoscopy;  Laterality: N/A;   ESOPHAGOGASTRODUODENOSCOPY (EGD) WITH  PROPOFOL N/A 09/21/2015   Procedure: ESOPHAGOGASTRODUODENOSCOPY (EGD) WITH PROPOFOL;  Surgeon: Gatha Mayer, MD;  [facility];  Service: Endoscopy;  Laterality: N/A;   ESOPHAGOGASTRODUODENOSCOPY (EGD) WITH PROPOFOL N/A 10/04/2015   Procedure: ESOPHAGOGASTRODUODENOSCOPY (EGD) WITH PROPOFOL;  Surgeon: [place] Banister, MD;  [facility];  Service: Endoscopy;  Laterality: N/A;  possible stricture   ESOPHAGOGASTRODUODENOSCOPY (EGD) WITH PROPOFOL N/A 12/10/2015   Procedure: ESOPHAGOGASTRODUODENOSCOPY (EGD) WITH PROPOFOL;  Surgeon: Gatha Mayer, MD;  [facility];  Service: Endoscopy;  Laterality: N/A;   ESOPHAGOGASTRODUODENOSCOPY (EGD) WITH PROPOFOL N/A 02/06/2016   Procedure: ESOPHAGOGASTRODUODENOSCOPY (EGD) WITH PROPOFOL;  Surgeon: Gatha Mayer, MD;  [facility];  Service: Endoscopy;  Laterality: N/A;   ESOPHAGOGASTRODUODENOSCOPY (EGD) WITH PROPOFOL N/A 10/10/2016   Procedure:  ESOPHAGOGASTRODUODENOSCOPY (EGD) WITH PROPOFOL;  Surgeon: Gatha Mayer, MD;  [facility];  Service: Endoscopy;  Laterality: N/A;  needs APC    EUS N/A 06/14/2015   Procedure: ESOPHAGEAL ENDOSCOPIC ULTRASOUND (EUS) RADIAL;  Surgeon: Milus Banister, MD;  [facility];  Service: Endoscopy;  Laterality: N/A;   HOT HEMOSTASIS N/A 10/10/2016   Procedure: HOT HEMOSTASIS (ARGON PLASMA COAGULATION/BICAP);  Surgeon: Gatha Mayer, MD;  Location[facility];  Service: Endoscopy;  Laterality: N/A;   KNEE ARTHROSCOPY Right    Right Knee, [place] [facility]   LAPAROSCOPIC GASTRECTOMY N/A 07/18/2015   Procedure: SUB TOTAL GASTRECTOMY;  Surgeon: Stark Klein, MD;  [facility];  Service: General;  Laterality: N/A;   LAPAROSCOPIC GASTROSTOMY N/A 07/18/2015   Procedure: FEEDING TUBE PLACEMENT;  Surgeon: Stark Klein, MD;  [facility];  Service: General;  Laterality: N/A;   LAPAROSCOPY N/A 07/18/2015   Procedure: LAPAROSCOPY DIAGNOSTIC;  Surgeon: Stark Klein, MD;  [facility];  Service: General;  Laterality: N/A;   MELANOMA EXCISION  02-01-16   abdominal wall   MOHS SURGERY  05/2020   nose   PROSTATE BIOPSY  09/06/14   ROTATOR CUFF REPAIR Right    R shoulder, [place]   SAVORY DILATION N/A 02/06/2016   Procedure: SAVORY DILATION;  Surgeon: Gatha Mayer, MD;  [facility];  Service: Endoscopy;  Laterality: N/A;   TOTAL SHOULDER ARTHROPLASTY Left 05/07/2017   Procedure: LEFT REVERSE TOTAL SHOULDER ARTHROPLASTY;  Surgeon: Justice Britain, MD;  Location: [facility];  Service: Orthopedics;  Laterality: Left;   TRIGGER FINGER RELEASE Left 1998   UPPER GASTROINTESTINAL ENDOSCOPY     x14   VASECTOMY      Prior to Admission medications   Medication Sig Start Date End Date Taking? Authorizing Provider  acetaminophen (TYLENOL) 500 MG tablet Take 1,000 mg by mouth every 6 (six) hours as needed for mild pain.     [provider]  influenza vaccine adjuvanted (FLUAD) 0.5 ML  injection Inject into the muscle. 07/10/22     Multiple Vitamin (MULTIVITAMIN WITH MINERALS) TABS tablet Take 1 tablet by mouth every other day.     [provider]  omeprazole (PRILOSEC) 40 MG capsule TAKE 1 CAPSULE DAILY 30 MINS BEFORE BREAKFAST OR SUPPER, OKAY TO OPEN AND SPRINKLE ON APPLESAUCE 05/21/22   Burns, Claudina Lick, MD  pravastatin (PRAVACHOL) 40 MG tablet TAKE 1 TABLET BY MOUTH EVERYDAY AT BEDTIME 07/11/22   Binnie Rail, MD    Current Outpatient Medications  Medication Sig Dispense Refill   acetaminophen (TYLENOL) 500 MG tablet Take 1,000 mg by mouth every 6 (six) hours as needed for mild pain.      influenza vaccine  adjuvanted (FLUAD) 0.5 ML injection Inject into the muscle. 0.5 mL 0   Multiple Vitamin (MULTIVITAMIN WITH MINERALS) TABS tablet Take 1 tablet by mouth every other day.      omeprazole (PRILOSEC) 40 MG capsule TAKE 1 CAPSULE DAILY 30 MINS BEFORE BREAKFAST OR SUPPER, OKAY TO OPEN AND SPRINKLE ON APPLESAUCE 30 capsule 0   pravastatin (PRAVACHOL) 40 MG tablet TAKE 1 TABLET BY MOUTH EVERYDAY AT BEDTIME 90 tablet 0   No current facility-administered medications for this visit.   Facility-Administered Medications Ordered in Other Visits  Medication Dose Route Frequency Provider Last Rate Last Admin   sodium chloride flush (NS) 0.9 % injection 10 mL  10 mL Intravenous PRN Penumalli, Earlean Polka, MD   4 mL at 06/21/20 0935    Allergies as of 07/24/2022 - Review Complete 07/10/2022  Allergen Reaction Noted   Codeine Nausea And Vomiting     Family History  Problem Relation Age of Onset   Diabetes Father    Heart attack Father 22   Liver disease Father        Related to Alcohol Use    Alcohol abuse Mother    Cirrhosis Mother        died in her 63s   Multiple myeloma Brother    Diabetes Sister    Colon cancer Neg Hx    Stomach cancer Neg Hx    Stroke Neg Hx    Esophageal cancer Neg Hx    Rectal cancer Neg Hx     Social History   Socioeconomic History    Marital status: Married    Spouse name: International aid/development worker   Number of children: 2   Years of education: Not on file   Highest education level: Not on file  Occupational History   Occupation: retired  Tobacco Use   Smoking status: Former    Packs/day: 0.50    Years: 5.00    Total pack years: 2.50    Types: Cigarettes    Quit date: 10/07/1967    Years since quitting: 54.8   Smokeless tobacco: Never   Tobacco comments:    smoked Decl[place] , up to < 1 ppd  Vaping Use   Vaping Use: Never used  Substance and Sexual Activity   Alcohol use: Yes    Comment: maybe once a week   Drug use: No   Sexual activity: Yes  Other Topics Concern   Not on file  Social History Narrative   Married and retired - 1 son and 1 daughter    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  Second home at [facility], [employer] condomin[place]   Very occasional alcohol, former smoker   Social Determinants of Health   Financial Resource Strain: Low Risk  (01/07/2022)   Overall Financial Resource Strain (CARDIA)    Difficulty of Paying Living Expenses: Not hard at all  Food Insecurity: No Food Insecurity (01/07/2022)   Hunger Vital Sign    Worried About Running Out of Food in the Last Year: Never true    [place] in the Last Year: Never true  Transportation Needs: No Transportation Needs (01/07/2022)   PRAPARE - Hydrologist (Medical): No    Lack of Transportation (Non-Medical): No  Physical Activity: Sufficiently Active (01/07/2022)   Exercise Vital Sign    Days of Exercise per Week: 7 days    Minutes of Exercise per Session: 30 min  Stress: No Stress Concern Present (  01/07/2022)   [place]    Feeling of Stress : Not at all  Social Connections: [place] (01/07/2022)   Social Connection and Isolation Panel [NHANES]    Frequency of Communication with Friends and Family: More than three times a week     Frequency of Social Gatherings with Friends and Family: More than three times a week    Attends Religious Services: More than 4 times per year    Active Member of [employer] or Organizations: Yes    Attends Music therapist: More than 4 times per year    Marital Status: Married  Human resources officer Violence: Not At Risk (01/07/2022)   Humiliation, Afraid, Rape, and Kick questionnaire    Fear of Current or Ex-Partner: No    Emotionally Abused: No    Physically Abused: No    Sexually Abused: No    Review of Systems: Positive for *** All other review of systems negative except as mentioned in the HPI.  Physical Exam: Vital signs There were no vitals taken for this visit.  General:   Alert,  Well-developed, well-nourished, pleasant and cooperative in NAD Lungs:  Clear throughout to auscultation.   Heart:  Regular rate and rhythm; no murmurs, clicks, rubs,  or gallops. Abdomen:  Soft, nontender and nondistended. Normal bowel sounds.   Neuro/Psych:  Alert and cooperative. Normal mood and affect. A and O x 3   @Yolonda Purtle  Simonne Maffucci, MD, [facility] [phone] (pager) 07/23/2022 8:23 PM@

## 2022-07-24 ENCOUNTER — Encounter: Payer: Self-pay | Admitting: Internal Medicine

## 2022-07-24 ENCOUNTER — Ambulatory Visit (AMBULATORY_SURGERY_CENTER): Payer: Medicare Other | Admitting: Internal Medicine

## 2022-07-24 VITALS — BP 114/80 | HR 81 | Temp 97.5°F | Resp 15 | Ht 66.0 in | Wt 150.0 lb

## 2022-07-24 DIAGNOSIS — R1011 Right upper quadrant pain: Secondary | ICD-10-CM | POA: Diagnosis not present

## 2022-07-24 DIAGNOSIS — R1319 Other dysphagia: Secondary | ICD-10-CM | POA: Diagnosis not present

## 2022-07-24 DIAGNOSIS — K219 Gastro-esophageal reflux disease without esophagitis: Secondary | ICD-10-CM | POA: Diagnosis not present

## 2022-07-24 DIAGNOSIS — K222 Esophageal obstruction: Secondary | ICD-10-CM

## 2022-07-24 DIAGNOSIS — R1310 Dysphagia, unspecified: Secondary | ICD-10-CM | POA: Diagnosis not present

## 2022-07-24 DIAGNOSIS — K3189 Other diseases of stomach and duodenum: Secondary | ICD-10-CM

## 2022-07-24 MED ORDER — SODIUM CHLORIDE 0.9 % IV SOLN: 500.0000 mL | Freq: Once | INTRAVENOUS | Status: DC

## 2022-07-24 NOTE — Patient Instructions (Addendum)
I dilated the gastroesophageal junction again.  Hope that helps.  Remember that your stomach is small and I suggest reducing portions to avoid the excessive feelings of fullness you also have.  I appreciate the opportunity to care for you. Gatha Mayer, MD, [facility]  Handout given for stricture and post dilation diet.  YOU HAD AN ENDOSCOPIC PROCEDURE TODAY AT [facility]:   Refer to the procedure report that was given to you for any specific questions about what was found during the examination.  If the procedure report does not answer your questions, please call your gastroenterologist to clarify.  If you requested that your care partner not be given the details of your procedure findings, then the procedure report has been included in a sealed envelope for you to review at your convenience later.  YOU SHOULD EXPECT: Some feelings of bloating in the abdomen. Passage of more gas than usual.  Walking can help get rid of the air that was put into your GI tract during the procedure and reduce the bloating. If you had a lower endoscopy (such as a colonoscopy or flexible sigmoidoscopy) you may notice spotting of blood in your stool or on the toilet paper. If you underwent a bowel prep for your procedure, you may not have a normal bowel movement for a few days.  Please Note:  You might notice some irritation and congestion in your nose or some drainage.  This is from the oxygen used during your procedure.  There is no need for concern and it should clear up in a day or so.  SYMPTOMS TO REPORT IMMEDIATELY:  Following upper endoscopy (EGD)  Vomiting of blood or coffee ground material  New chest pain or pain under the shoulder blades  Painful or persistently difficult swallowing  New shortness of breath  Fever of 100F or higher  Black, tarry-looking stools  For urgent or emergent issues, a gastroenterologist can be reached at any hour by calling [phone]. Do not use MyChart  messaging for urgent concerns.    DIET:  SEE POST DILATION DIET HANDOUT.  CLEAR LIQUIDS FOR ONE HOUR ( UNTIL 46) THEN SOFT DIET THE REST OF TODAY.   Then you may proceed to your regular diet TOMORROW.  Drink plenty of fluids but you should avoid alcoholic beverages for 24 hours.   ACTIVITY:  You should plan to take it easy for the rest of today and you should NOT DRIVE or use heavy machinery until tomorrow (because of the sedation medicines used during the test).    FOLLOW UP: Our staff will call the number listed on your records the next business day following your procedure.  We will call around 7:15- 8:00 am to check on you and address any questions or concerns that you may have regarding the information given to you following your procedure. If we do not reach you, we will leave a message.     If any biopsies were taken you will be contacted by phone or by letter within the next 1-3 weeks.  Please call us at [phone] if you have not heard about the biopsies in 3 weeks.    SIGNATURES/CONFIDENTIALITY: You and/or your care partner have signed paperwork which will be entered into your electronic medical record.  These signatures attest to the fact that that the information above on your After Visit Summary has been reviewed and is understood.  Full responsibility of the confidentiality of this discharge information lies with you and/or  your care-partner.

## 2022-07-24 NOTE — Progress Notes (Signed)
VS completed by DT.  Pt's states no medical or surgical changes since previsit or office visit.  

## 2022-07-24 NOTE — Progress Notes (Signed)
Report to PACU, RN, vss, BBS= Clear.  

## 2022-07-24 NOTE — Op Note (Signed)
[facility] Patient Name: James [name] Procedure Date: 07/24/2022 10:04 AM MRN: [id number] Endoscopist: Gatha Mayer , MD Age: 76 Referring MD:  Date of Birth: 1946/09/08 Gender: Male Account #: [id number] Procedure:                Upper GI endoscopy Indications:              Dysphagia, Management of operative complication:                            Dilation of anastomotic stricture Medicines:                Monitored Anesthesia Care Procedure:                Pre-Anesthesia Assessment:                           - Prior to the procedure, a History and Physical                            was performed, and patient medications and                            allergies were reviewed. The patient's tolerance of                            previous anesthesia was also reviewed. The risks                            and benefits of the procedure and the sedation                            options and risks were discussed with the patient.                            All questions were answered, and informed consent                            was obtained. Prior Anticoagulants: The patient has                            taken no previous anticoagulant or antiplatelet                            agents. ASA Grade Assessment: II - A patient with                            mild systemic disease. After reviewing the risks                            and benefits, the patient was deemed in                            satisfactory condition to undergo the procedure.  After obtaining informed consent, the endoscope was                            passed under direct vision. Throughout the                            procedure, the patient's blood pressure, pulse, and                            oxygen saturations were monitored continuously. The                            GIF [id number] #[id number] was introduced through the                            mouth, and advanced to the  second part of duodenum.                            The upper GI endoscopy was accomplished without                            difficulty. The patient tolerated the procedure                            well. Scope In: Scope Out: Findings:                 The examined esophagus was normal.                           A benign-appearing, intrinsic moderate stenosis was                            found at the gastroesophageal junction. This was                            traversed. A TTS dilator was passed through the                            scope. Dilation with an 18-19-20 mm balloon dilator                            was performed to 20 mm. The dilation site was                            examined and showed mild mucosal disruption.                            Estimated blood loss was minimal.                           Evidence of a proximal hemi-gastrectomy were found                            in the cardia, in  the gastric fundus and in the                            gastric body.                           The examined duodenum was normal.                           The cardia and gastric fundus were otherwise normal                            (post-operative)on retroflexion. Complications:            No immediate complications. Estimated Blood Loss:     Estimated blood loss was minimal. Impression:               - Normal esophagus.                           - Gastric stenosis was found at the                            gastroesophageal junction. Dilated.                           - A proximal hemi-gastrectomy were found.                           - Normal examined duodenum.                           - No specimens collected. Recommendation:           - Patient has a contact number available for                            emergencies. The signs and symptoms of potential                            delayed complications were discussed with the                            patient. Return  to normal activities tomorrow.                            Written discharge instructions were provided to the                            patient.                           - Clear liquids x 1 hour then soft foods rest of                            day. Start prior diet tomorrow.                           -  Continue present medications.                           - Repeat upper endoscopy PRN.                           - reduce portion sizes to reduce satiety symptoms Gatha Mayer, MD 07/24/2022 10:24:12 AM This report has been signed electronically.

## 2022-07-24 NOTE — Progress Notes (Signed)
Called to room to assist during endoscopic procedure.  Patient ID and intended procedure confirmed with present staff. Received instructions for my participation in the procedure from the performing physician.  

## 2022-07-25 ENCOUNTER — Telehealth: Payer: Self-pay

## 2022-07-25 NOTE — Telephone Encounter (Signed)
  Follow up Call-     07/24/2022    9:43 AM 02/26/2021    7:05 AM  Call back number  Post procedure Call Back phone  # [phone]  Permission to leave phone message Yes Yes     Patient questions:  Do you have a fever, pain , or abdominal swelling? No. Pain Score  0 *  Have you tolerated food without any problems? Yes.    Have you been able to return to your normal activities? Yes.    Do you have any questions about your discharge instructions: Diet   No. Medications  No. Follow up visit  No.  Do you have questions or concerns about your Care? No.  Actions: * If pain score is 4 or above: No action needed, pain <4.

## 2022-08-04 NOTE — Progress Notes (Unsigned)
Subjective:    Patient ID: James [name], male    DOB: 06/30/1946, 76 y.o.   MRN: [id number]     HPI James [name] is here for follow up of his chronic medical problems, including GERD, HLD, B12 deficiency, history of gastric cancer, melanoma  Playing golf and tennis.  Has aches and pain - has pain in hip pain - will see ortho.  Gets plenty of sleep.  Overall doing well.  Medications and allergies reviewed with patient and updated if appropriate.  Current Outpatient Medications on File Prior to Visit  Medication Sig Dispense Refill   acetaminophen (TYLENOL) 500 MG tablet Take 1,000 mg by mouth every 6 (six) hours as needed for mild pain.      Multiple Vitamin (MULTIVITAMIN WITH MINERALS) TABS tablet Take 1 tablet by mouth every other day.      omeprazole (PRILOSEC) 40 MG capsule TAKE 1 CAPSULE DAILY 30 MINS BEFORE BREAKFAST OR SUPPER, OKAY TO OPEN AND SPRINKLE ON APPLESAUCE 30 capsule 0   pravastatin (PRAVACHOL) 40 MG tablet TAKE 1 TABLET BY MOUTH EVERYDAY AT BEDTIME 90 tablet 0   Current Facility-Administered Medications on File Prior to Visit  Medication Dose Route Frequency Provider Last Rate Last Admin   sodium chloride flush (NS) 0.9 % injection 10 mL  10 mL Intravenous PRN Penumalli, Vikram R, MD   4 mL at 06/21/20 0935     Review of Systems  Constitutional:  Negative for fever.  Respiratory:  Negative for cough, shortness of breath and wheezing.   Cardiovascular:  Negative for chest pain, palpitations and leg swelling.  Gastrointestinal:  Negative for abdominal pain, blood in stool, constipation and diarrhea.  Neurological:  Positive for headaches. Negative for dizziness and light-headedness.       Objective:   Vitals:   08/05/22 0903  BP: 120/80  Pulse: 82  Temp: 98 F (36.7 C)  SpO2: 96%   BP Readings from Last 3 Encounters:  08/05/22 120/80  07/24/22 114/80  07/10/22 132/81   Wt Readings from Last 3 Encounters:  08/05/22 149 lb 9.6 oz (67.9 kg)   07/24/22 150 lb (68 kg)  07/10/22 150 lb 9.6 oz (68.3 kg)   Body mass index is 24.15 kg/m.    Physical Exam Constitutional:      General: He is not in acute distress.    Appearance: Normal appearance. He is not ill-appearing.  HENT:     Head: Normocephalic and atraumatic.  Eyes:     Conjunctiva/sclera: Conjunctivae normal.  Cardiovascular:     Rate and Rhythm: Normal rate and regular rhythm.     Heart sounds: Normal heart sounds. No murmur heard. Pulmonary:     Effort: Pulmonary effort is normal. No respiratory distress.     Breath sounds: Normal breath sounds. No wheezing or rales.  Abdominal:     General: There is no distension.     Palpations: Abdomen is soft.     Tenderness: There is no abdominal tenderness. There is no guarding or rebound.  Musculoskeletal:     Right lower leg: No edema.     Left lower leg: No edema.  Skin:    General: Skin is warm and dry.     Findings: No rash.  Neurological:     Mental Status: He is alert. Mental status is at baseline.  Psychiatric:        Mood and Affect: Mood normal.        Lab Results  Component Value  Date   WBC 5.4 07/10/2022   HGB 13.1 07/10/2022   HCT 39.3 07/10/2022   PLT 204 07/10/2022   GLUCOSE 91 04/18/2020   CHOL 141 02/02/2020   TRIG 48.0 02/02/2020   HDL 60.20 02/02/2020   LDLCALC 71 02/02/2020   ALT 10 02/02/2020   AST 25 02/02/2020   NA 142 04/18/2020   K 4.3 04/18/2020   CL 106 04/18/2020   CREATININE 0.90 04/18/2020   BUN 14 04/18/2020   CO2 28 04/18/2020   TSH 2.87 04/18/2020   PSA 1.85 12/21/2009   INR 1.21 07/19/2015   HGBA1C 5.7 02/02/2020     Assessment & Plan:    See Problem List for Assessment and Plan of chronic medical problems.

## 2022-08-04 NOTE — Patient Instructions (Addendum)
    B12 injection given    Blood work was ordered.   The lab is on the first floor.    Medications changes include :   none     Return in about 1 year (around 08/06/2023) for follow up.

## 2022-08-05 ENCOUNTER — Ambulatory Visit: Payer: Medicare Other

## 2022-08-05 ENCOUNTER — Ambulatory Visit (INDEPENDENT_AMBULATORY_CARE_PROVIDER_SITE_OTHER): Payer: Medicare Other | Admitting: Internal Medicine

## 2022-08-05 ENCOUNTER — Encounter: Payer: Self-pay | Admitting: Internal Medicine

## 2022-08-05 VITALS — BP 120/80 | HR 82 | Temp 98.0°F | Ht 66.0 in | Wt 149.6 lb

## 2022-08-05 DIAGNOSIS — E782 Mixed hyperlipidemia: Secondary | ICD-10-CM

## 2022-08-05 DIAGNOSIS — E559 Vitamin D deficiency, unspecified: Secondary | ICD-10-CM

## 2022-08-05 DIAGNOSIS — R739 Hyperglycemia, unspecified: Secondary | ICD-10-CM

## 2022-08-05 DIAGNOSIS — E538 Deficiency of other specified B group vitamins: Secondary | ICD-10-CM

## 2022-08-05 DIAGNOSIS — K219 Gastro-esophageal reflux disease without esophagitis: Secondary | ICD-10-CM

## 2022-08-05 LAB — COMPREHENSIVE METABOLIC PANEL
ALT: 6 U/L (ref 0–53)
AST: 21 U/L (ref 0–37)
Albumin: 4.1 g/dL (ref 3.5–5.2)
Alkaline Phosphatase: 46 U/L (ref 39–117)
BUN: 17 mg/dL (ref 6–23)
CO2: 26 mEq/L (ref 19–32)
Calcium: 9 mg/dL (ref 8.4–10.5)
Chloride: 105 mEq/L (ref 96–112)
Creatinine, Ser: 0.77 mg/dL (ref 0.40–1.50)
GFR: 86.26 mL/min (ref >60.00)
Glucose, Bld: 94 mg/dL (ref 70–99)
Potassium: 3.9 mEq/L (ref 3.5–5.1)
Sodium: 139 mEq/L (ref 135–145)
Total Bilirubin: 0.6 mg/dL (ref 0.2–1.2)
Total Protein: 6.8 g/dL (ref 6.0–8.3)

## 2022-08-05 LAB — LIPID PANEL
Cholesterol: 147 mg/dL (ref 0–200)
HDL: 61.4 mg/dL (ref >39.00)
LDL Cholesterol: 75 mg/dL (ref 0–99)
NonHDL: 85.69
Total CHOL/HDL Ratio: 2
Triglycerides: 54 mg/dL (ref 0.0–149.0)
VLDL: 10.8 mg/dL (ref 0.0–40.0)

## 2022-08-05 LAB — HEMOGLOBIN A1C: Hgb A1c MFr Bld: 6.3 % (ref 4.6–6.5)

## 2022-08-05 LAB — VITAMIN D 25 HYDROXY (VIT D DEFICIENCY, FRACTURES): VITD: 18.49 ng/mL — ABNORMAL LOW (ref 30.00–100.00)

## 2022-08-05 MED ORDER — CYANOCOBALAMIN 1000 MCG/ML IJ SOLN
1000.0000 ug | Freq: Once | INTRAMUSCULAR | Status: AC
  Administered 2022-08-05: 1000 ug via INTRAMUSCULAR

## 2022-08-05 NOTE — Assessment & Plan Note (Signed)
Chronic GERD controlled eat smaller meals Continue omeprazole 40 mg daily

## 2022-08-05 NOTE — Assessment & Plan Note (Signed)
Chronic Taking vitamin d daily Check vitamin d level  

## 2022-08-05 NOTE — Addendum Note (Signed)
Addended by: Marcina Millard on: 08/05/2022 12:44 PM   Modules accepted: Orders

## 2022-08-05 NOTE — Assessment & Plan Note (Signed)
Chronic Regular exercise and healthy diet encouraged Check lipid panel, CMP Continue pravastatin 40 mg daily

## 2022-08-05 NOTE — Assessment & Plan Note (Signed)
Chronic Check a1c Low sugar / carb diet Continue regular exercise

## 2022-08-05 NOTE — Assessment & Plan Note (Signed)
Chronic S/p two thirds gastric resection due to cancer-requires monthly B12 injections Injection today

## 2022-08-06 ENCOUNTER — Encounter: Payer: Self-pay | Admitting: Internal Medicine

## 2022-08-08 DIAGNOSIS — C61 Malignant neoplasm of prostate: Secondary | ICD-10-CM | POA: Diagnosis not present

## 2022-08-15 DIAGNOSIS — N529 Male erectile dysfunction, unspecified: Secondary | ICD-10-CM | POA: Diagnosis not present

## 2022-08-15 DIAGNOSIS — Z8546 Personal history of malignant neoplasm of prostate: Secondary | ICD-10-CM | POA: Diagnosis not present

## 2022-09-10 ENCOUNTER — Ambulatory Visit: Payer: Medicare Other

## 2022-09-12 ENCOUNTER — Ambulatory Visit: Payer: Medicare Other

## 2022-09-15 ENCOUNTER — Ambulatory Visit (INDEPENDENT_AMBULATORY_CARE_PROVIDER_SITE_OTHER): Payer: Medicare Other

## 2022-09-15 DIAGNOSIS — E538 Deficiency of other specified B group vitamins: Secondary | ICD-10-CM

## 2022-09-15 MED ORDER — CYANOCOBALAMIN 1000 MCG/ML IJ SOLN
1000.0000 ug | Freq: Once | INTRAMUSCULAR | Status: AC
  Administered 2022-09-15: 1000 ug via INTRAMUSCULAR

## 2022-09-15 NOTE — Progress Notes (Signed)
Pt was given B12 injection w/o any complications. 

## 2022-09-16 DIAGNOSIS — Z85820 Personal history of malignant melanoma of skin: Secondary | ICD-10-CM | POA: Diagnosis not present

## 2022-09-16 DIAGNOSIS — C4441 Basal cell carcinoma of skin of scalp and neck: Secondary | ICD-10-CM | POA: Diagnosis not present

## 2022-09-16 DIAGNOSIS — D485 Neoplasm of uncertain behavior of skin: Secondary | ICD-10-CM | POA: Diagnosis not present

## 2022-09-16 DIAGNOSIS — Z85828 Personal history of other malignant neoplasm of skin: Secondary | ICD-10-CM | POA: Diagnosis not present

## 2022-09-17 ENCOUNTER — Other Ambulatory Visit: Payer: Self-pay | Admitting: Internal Medicine

## 2022-09-17 MED ORDER — NIRMATRELVIR/RITONAVIR (PAXLOVID)TABLET: 3.0000 | ORAL_TABLET | Freq: Two times a day (BID) | ORAL | 0 refills | Status: AC

## 2022-10-14 DIAGNOSIS — M25552 Pain in left hip: Secondary | ICD-10-CM | POA: Diagnosis not present

## 2022-10-14 DIAGNOSIS — M7062 Trochanteric bursitis, left hip: Secondary | ICD-10-CM | POA: Diagnosis not present

## 2022-10-16 ENCOUNTER — Ambulatory Visit (INDEPENDENT_AMBULATORY_CARE_PROVIDER_SITE_OTHER): Payer: Medicare Other | Admitting: *Deleted

## 2022-10-16 ENCOUNTER — Encounter: Payer: Self-pay | Admitting: Internal Medicine

## 2022-10-16 DIAGNOSIS — E538 Deficiency of other specified B group vitamins: Secondary | ICD-10-CM | POA: Diagnosis not present

## 2022-10-16 MED ORDER — CYANOCOBALAMIN 1000 MCG/ML IJ SOLN
1000.0000 ug | Freq: Once | INTRAMUSCULAR | Status: AC
  Administered 2022-10-16: 1000 ug via INTRAMUSCULAR

## 2022-10-16 NOTE — Progress Notes (Signed)
Pls cosign for B12 inj in absence of PCP../lmb   

## 2022-10-16 NOTE — Progress Notes (Signed)
.   Subjective:    Patient ID: James [name], male    DOB: Jun 14, 1946, 77 y.o.   MRN: [id number]      HPI James [name] is here for  Chief Complaint  Patient presents with   Cough    Cough x 5 days (better since taking Mucinex); Clear mucus; No body aches, fever or chills noted     He is here for an acute visit for cold symptoms.  His symptoms started about 6 days  He is experiencing cough that is productive with clear mucus - he thinks the mucus is starting to change.  He has a headache. He states a little decreased appetite, chills and runny nose.   He has tried taking mucinex-dm, cough drops     Medications and allergies reviewed with patient and updated if appropriate.  Current Outpatient Medications on File Prior to Visit  Medication Sig Dispense Refill   acetaminophen (TYLENOL) 500 MG tablet Take 1,000 mg by mouth every 6 (six) hours as needed for mild pain.      Multiple Vitamin (MULTIVITAMIN WITH MINERALS) TABS tablet Take 1 tablet by mouth every other day.      omeprazole (PRILOSEC) 40 MG capsule TAKE 1 CAPSULE DAILY 30 MINS BEFORE BREAKFAST OR SUPPER, OKAY TO OPEN AND SPRINKLE ON APPLESAUCE 30 capsule 0   pravastatin (PRAVACHOL) 40 MG tablet TAKE 1 TABLET BY MOUTH EVERYDAY AT BEDTIME 90 tablet 0   No current facility-administered medications on file prior to visit.    Review of Systems  Constitutional:  Positive for appetite change and chills. Negative for fatigue and fever.  HENT:  Positive for rhinorrhea. Negative for congestion, ear pain, sinus pain and sore throat.   Respiratory:  Positive for cough. Negative for chest tightness, shortness of breath and wheezing.   Neurological:  Positive for headaches.       Objective:   Vitals:   10/17/22 0749  BP: 116/74  Pulse: 70  Temp: 98 F (36.7 C)  SpO2: 96%   BP Readings from Last 3 Encounters:  10/17/22 116/74  08/05/22 120/80  07/24/22 114/80   Wt Readings from Last 3 Encounters:  10/17/22 150 lb (68  kg)  08/05/22 149 lb 9.6 oz (67.9 kg)  07/24/22 150 lb (68 kg)   Body mass index is 24.21 kg/m.    Physical Exam Constitutional:      General: He is not in acute distress.    Appearance: Normal appearance. He is not ill-appearing.  HENT:     Head: Normocephalic.     Mouth/Throat:     Mouth: Mucous membranes are moist.     Pharynx: No oropharyngeal exudate or posterior oropharyngeal erythema.  Eyes:     Conjunctiva/sclera: Conjunctivae normal.  Cardiovascular:     Rate and Rhythm: Normal rate and regular rhythm.  Pulmonary:     Effort: Pulmonary effort is normal. No respiratory distress.     Breath sounds: No wheezing or rales.     Comments: Frequent cough - dry Musculoskeletal:     Cervical back: Neck supple. No tenderness.  Lymphadenopathy:     Cervical: No cervical adenopathy.  Skin:    General: Skin is warm and dry.     Findings: No rash.  Neurological:     Mental Status: He is alert.            Assessment & Plan:    See Problem List for Assessment and Plan of chronic medical problems.

## 2022-10-17 ENCOUNTER — Ambulatory Visit (INDEPENDENT_AMBULATORY_CARE_PROVIDER_SITE_OTHER): Payer: Medicare Other

## 2022-10-17 ENCOUNTER — Ambulatory Visit (INDEPENDENT_AMBULATORY_CARE_PROVIDER_SITE_OTHER): Payer: Medicare Other | Admitting: Internal Medicine

## 2022-10-17 VITALS — BP 116/74 | HR 70 | Temp 98.0°F | Ht 66.0 in | Wt 150.0 lb

## 2022-10-17 DIAGNOSIS — R051 Acute cough: Secondary | ICD-10-CM | POA: Diagnosis not present

## 2022-10-17 DIAGNOSIS — R059 Cough, unspecified: Secondary | ICD-10-CM | POA: Diagnosis not present

## 2022-10-17 DIAGNOSIS — J069 Acute upper respiratory infection, unspecified: Secondary | ICD-10-CM | POA: Diagnosis not present

## 2022-10-17 MED ORDER — PREDNISONE 20 MG PO TABS: 40.0000 mg | ORAL_TABLET | Freq: Every day | ORAL | 0 refills | Status: AC

## 2022-10-17 MED ORDER — HYDROCOD POLI-CHLORPHE POLI ER 10-8 MG/5ML PO SUER: 5.0000 mL | Freq: Two times a day (BID) | ORAL | 0 refills | Status: DC | PRN

## 2022-10-17 NOTE — Patient Instructions (Addendum)
      Have a chest xray downstairs.     Medications changes include :   prednisone 40 mg daily x 5 days, cough syrup      Return if symptoms worsen or fail to improve.

## 2022-10-17 NOTE — Assessment & Plan Note (Signed)
Acute Started 6 days ago - coughs viral with mild RAD but want to r/o PNA Cxr today Prednisone 40 mg daily x 5 days, tussionex cough syrup May need to add an abx Mucinex, otc meds Rest, fluids

## 2022-11-04 ENCOUNTER — Other Ambulatory Visit: Payer: Self-pay | Admitting: Internal Medicine

## 2022-11-04 DIAGNOSIS — E782 Mixed hyperlipidemia: Secondary | ICD-10-CM

## 2022-11-05 ENCOUNTER — Other Ambulatory Visit: Payer: Self-pay

## 2022-11-05 DIAGNOSIS — E782 Mixed hyperlipidemia: Secondary | ICD-10-CM

## 2022-11-05 MED ORDER — PRAVASTATIN SODIUM 40 MG PO TABS: 40.0000 mg | ORAL_TABLET | Freq: Every day | ORAL | 0 refills | Status: DC

## 2022-11-05 MED ORDER — PRAVASTATIN SODIUM 40 MG PO TABS: 40.0000 mg | ORAL_TABLET | Freq: Every day | ORAL | 1 refills | Status: DC

## 2022-11-16 ENCOUNTER — Encounter: Payer: Self-pay | Admitting: Internal Medicine

## 2022-11-16 NOTE — Progress Notes (Signed)
    Subjective:    Patient ID: INDY KUCK, male    DOB: Nov 01, 1945, 77 y.o.   MRN: [id number]      HPI Kaiyu is here for No chief complaint on file.   He is here for an acute visit for cold symptoms.  His symptoms started  He is experiencing   He has tried taking   Due for B12 injection     Medications and allergies reviewed with patient and updated if appropriate.  Current Outpatient Medications on File Prior to Visit  Medication Sig Dispense Refill   acetaminophen (TYLENOL) 500 MG tablet Take 1,000 mg by mouth every 6 (six) hours as needed for mild pain.      chlorpheniramine-HYDROcodone (TUSSIONEX) 10-8 MG/5ML Take 5 mLs by mouth every 12 (twelve) hours as needed. 115 mL 0   Multiple Vitamin (MULTIVITAMIN WITH MINERALS) TABS tablet Take 1 tablet by mouth every other day.      omeprazole (PRILOSEC) 40 MG capsule TAKE 1 CAPSULE DAILY 30 MINS BEFORE BREAKFAST OR SUPPER, OKAY TO OPEN AND SPRINKLE ON APPLESAUCE 30 capsule 0   pravastatin (PRAVACHOL) 40 MG tablet Take 1 tablet (40 mg total) by mouth daily. TAKE 1 TABLET BY MOUTH EVERYDAY AT BEDTIME Strength: 40 mg 90 tablet 1   No current facility-administered medications on file prior to visit.    Review of Systems     Objective:  There were no vitals filed for this visit. BP Readings from Last 3 Encounters:  10/17/22 116/74  08/05/22 120/80  07/24/22 114/80   Wt Readings from Last 3 Encounters:  10/17/22 150 lb (68 kg)  08/05/22 149 lb 9.6 oz (67.9 kg)  07/24/22 150 lb (68 kg)   There is no height or weight on file to calculate BMI.    Physical Exam         Assessment & Plan:    See Problem List for Assessment and Plan of chronic medical problems.

## 2022-11-16 NOTE — Patient Instructions (Addendum)
      B12 injection today    Medications changes include :   augmentin twice daily x 10 days     Return if symptoms worsen or fail to improve.

## 2022-11-17 ENCOUNTER — Ambulatory Visit: Payer: Medicare Other

## 2022-11-17 ENCOUNTER — Ambulatory Visit (INDEPENDENT_AMBULATORY_CARE_PROVIDER_SITE_OTHER): Payer: Medicare Other | Admitting: Internal Medicine

## 2022-11-17 VITALS — BP 118/72 | HR 54 | Temp 98.2°F | Ht 66.0 in | Wt 150.0 lb

## 2022-11-17 DIAGNOSIS — J019 Acute sinusitis, unspecified: Secondary | ICD-10-CM

## 2022-11-17 DIAGNOSIS — E538 Deficiency of other specified B group vitamins: Secondary | ICD-10-CM | POA: Diagnosis not present

## 2022-11-17 MED ORDER — AMOXICILLIN-POT CLAVULANATE 875-125 MG PO TABS: 1.0000 | ORAL_TABLET | Freq: Two times a day (BID) | ORAL | 0 refills | Status: AC

## 2022-11-17 MED ORDER — CYANOCOBALAMIN 1000 MCG/ML IJ SOLN
1000.0000 ug | Freq: Once | INTRAMUSCULAR | Status: AC
  Administered 2022-11-17: 1000 ug via INTRAMUSCULAR

## 2022-11-17 NOTE — Assessment & Plan Note (Signed)
Chronic On monthly B12 injections indefinitely due to gastric resection B12 due - given today

## 2022-11-17 NOTE — Assessment & Plan Note (Signed)
Acute Likely bacterial  Start Augmentin 875-125 mg BID x 10 day otc cold medications - mucinex- DM Rest, fluid Call if no improvement

## 2022-11-24 ENCOUNTER — Other Ambulatory Visit: Payer: Self-pay | Admitting: Internal Medicine

## 2022-11-24 ENCOUNTER — Other Ambulatory Visit: Payer: Self-pay

## 2022-11-27 ENCOUNTER — Other Ambulatory Visit: Payer: Self-pay | Admitting: Internal Medicine

## 2022-12-17 ENCOUNTER — Ambulatory Visit (INDEPENDENT_AMBULATORY_CARE_PROVIDER_SITE_OTHER): Payer: Medicare Other | Admitting: *Deleted

## 2022-12-17 DIAGNOSIS — E538 Deficiency of other specified B group vitamins: Secondary | ICD-10-CM

## 2022-12-17 MED ORDER — CYANOCOBALAMIN 1000 MCG/ML IJ SOLN
1000.0000 ug | Freq: Once | INTRAMUSCULAR | Status: AC
  Administered 2022-12-17: 1000 ug via INTRAMUSCULAR

## 2022-12-17 NOTE — Progress Notes (Signed)
Pls cosign for B12 inj../lmb  

## 2022-12-26 ENCOUNTER — Other Ambulatory Visit: Payer: Self-pay | Admitting: Internal Medicine

## 2022-12-30 DIAGNOSIS — M25561 Pain in right knee: Secondary | ICD-10-CM | POA: Diagnosis not present

## 2023-01-06 ENCOUNTER — Telehealth: Payer: Self-pay

## 2023-01-06 NOTE — Telephone Encounter (Signed)
Called patient to schedule Medicare Annual Wellness Visit (AWV). Unable to reach patient.  Last date of AWV: 01/07/22  Please schedule an appointment at any time with [facility].   Norton Blizzard, CMA (AAMA)  [facility]

## 2023-01-19 ENCOUNTER — Ambulatory Visit: Payer: Medicare Other

## 2023-01-21 ENCOUNTER — Ambulatory Visit (INDEPENDENT_AMBULATORY_CARE_PROVIDER_SITE_OTHER): Payer: Medicare Other | Admitting: *Deleted

## 2023-01-21 DIAGNOSIS — E538 Deficiency of other specified B group vitamins: Secondary | ICD-10-CM

## 2023-01-21 MED ORDER — CYANOCOBALAMIN 1000 MCG/ML IJ SOLN
1000.0000 ug | Freq: Once | INTRAMUSCULAR | Status: AC
  Administered 2023-01-21: 1000 ug via INTRAMUSCULAR

## 2023-01-21 NOTE — Progress Notes (Signed)
Pls cosign for B12 inj../lmb  

## 2023-02-17 DIAGNOSIS — Z961 Presence of intraocular lens: Secondary | ICD-10-CM | POA: Diagnosis not present

## 2023-02-25 ENCOUNTER — Ambulatory Visit (INDEPENDENT_AMBULATORY_CARE_PROVIDER_SITE_OTHER): Payer: Medicare Other

## 2023-02-25 DIAGNOSIS — E538 Deficiency of other specified B group vitamins: Secondary | ICD-10-CM | POA: Diagnosis not present

## 2023-02-25 MED ORDER — CYANOCOBALAMIN 1000 MCG/ML IJ SOLN
1000.0000 ug | Freq: Once | INTRAMUSCULAR | Status: AC
Start: 2023-02-25 — End: 2023-02-25
  Administered 2023-02-25: 1000 ug via INTRAMUSCULAR

## 2023-02-25 NOTE — Progress Notes (Signed)
Pt was given B12 w/o any complication. 

## 2023-03-23 LAB — LAB REPORT - SCANNED
A1c: 6.3
EGFR: 76

## 2023-04-01 ENCOUNTER — Ambulatory Visit (INDEPENDENT_AMBULATORY_CARE_PROVIDER_SITE_OTHER): Payer: Medicare Other

## 2023-04-01 DIAGNOSIS — E538 Deficiency of other specified B group vitamins: Secondary | ICD-10-CM

## 2023-04-01 MED ORDER — CYANOCOBALAMIN 1000 MCG/ML IJ SOLN
1000.0000 ug | Freq: Once | INTRAMUSCULAR | Status: AC
Start: 2023-04-01 — End: 2023-04-01
  Administered 2023-04-01: 1000 ug via INTRAMUSCULAR

## 2023-04-01 NOTE — Progress Notes (Signed)
Pt here for monthly B12 injection per   B12 1000mcg given IM and pt tolerated injection well.   

## 2023-04-07 ENCOUNTER — Telehealth: Payer: Self-pay

## 2023-04-07 ENCOUNTER — Other Ambulatory Visit: Payer: Self-pay

## 2023-04-07 ENCOUNTER — Ambulatory Visit (INDEPENDENT_AMBULATORY_CARE_PROVIDER_SITE_OTHER): Payer: Medicare Other

## 2023-04-07 VITALS — Ht 66.0 in | Wt 151.5 lb

## 2023-04-07 DIAGNOSIS — Z0000 Encounter for general adult medical examination without abnormal findings: Secondary | ICD-10-CM | POA: Diagnosis not present

## 2023-04-07 DIAGNOSIS — E782 Mixed hyperlipidemia: Secondary | ICD-10-CM

## 2023-04-07 MED ORDER — PRAVASTATIN SODIUM 40 MG PO TABS
40.0000 mg | ORAL_TABLET | Freq: Every day | ORAL | 1 refills | Status: DC
Start: 2023-04-07 — End: 2024-07-27

## 2023-04-07 MED ORDER — OMEPRAZOLE 40 MG PO CPDR: DELAYED_RELEASE_CAPSULE | ORAL | 1 refills | Status: DC

## 2023-04-07 NOTE — Telephone Encounter (Signed)
Patient is requesting refills on the following medication: Omeprazole 40 mg and Pravastatin 40 mg to be sent to pharmacy on file.

## 2023-04-07 NOTE — Patient Instructions (Addendum)
[name] , Thank you for taking time to come for your Medicare Wellness Visit. I appreciate your ongoing commitment to your health goals. Please review the following plan we discussed and let me know if I can assist you in the future.   These are the goals we discussed:  Goals      My healthcare goal for 2024 is to stay healthy, physically active by playing tennis/golf and keeping my B12 injections current.        This is a list of the screening recommended for you and due dates:  Health Maintenance  Topic Date Due   Zoster (Shingles) Vaccine (2 of 2) 01/19/2018   COVID-19 Vaccine (5 - 2023-24 season) 06/06/2022   Flu Shot  05/07/2023   Medicare Annual Wellness Visit  04/06/2024   DTaP/Tdap/Td vaccine (4 - Td or Tdap) 04/18/2030   Pneumonia Vaccine  Completed   Hepatitis C Screening  Completed   HPV Vaccine  Aged Out   Colon Cancer Screening  Discontinued    Advanced directives: Yes  Conditions/risks identified: Yes  Next appointment: It was nice speaking with you today!  Please follow up in one year for your annual wellness visit via telephone call with Nurse James [name] on 04/11/2024 at 10:45 a.m.  If you need to cancel or reschedule please call [phone].  Preventive Care 45 Years and Older, Male  Preventive care refers to lifestyle choices and visits with your health care provider that can promote health and wellness. What does preventive care include? A yearly physical exam. This is also called an annual well check. Dental exams once or twice a year. Routine eye exams. Ask your health care provider how often you should have your eyes checked. Personal lifestyle choices, including: Daily care of your teeth and gums. Regular physical activity. Eating a healthy diet. Avoiding tobacco and drug use. Limiting alcohol use. Practicing safe sex. Taking low doses of aspirin every day. Taking vitamin and mineral supplements as recommended by your health care provider. What  happens during an annual well check? The services and screenings done by your health care provider during your annual well check will depend on your age, overall health, lifestyle risk factors, and family history of disease. Counseling  Your health care provider may ask you questions about your: Alcohol use. Tobacco use. Drug use. Emotional well-being. Home and relationship well-being. Sexual activity. Eating habits. History of falls. Memory and ability to understand (cognition). Work and work Astronomer. Screening  You may have the following tests or measurements: Height, weight, and BMI. Blood pressure. Lipid and cholesterol levels. These may be checked every 5 years, or more frequently if you are over 47 years old. Skin check. Lung cancer screening. You may have this screening every year starting at age 9 if you have a 30-pack-year history of smoking and currently smoke or have quit within the past 15 years. Fecal occult blood test (FOBT) of the stool. You may have this test every year starting at age 57. Flexible sigmoidoscopy or colonoscopy. You may have a sigmoidoscopy every 5 years or a colonoscopy every 10 years starting at age 11. Prostate cancer screening. Recommendations will vary depending on your family history and other risks. Hepatitis C blood test. Hepatitis B blood test. Sexually transmitted disease (STD) testing. Diabetes screening. This is done by checking your blood sugar (glucose) after you have not eaten for a while (fasting). You may have this done every 1-3 years. Abdominal aortic aneurysm (AAA) screening. You may need  this if you are a current or former smoker. Osteoporosis. You may be screened starting at age 11 if you are at high risk. Talk with your health care provider about your test results, treatment options, and if necessary, the need for more tests. Vaccines  Your health care provider may recommend certain vaccines, such as: Influenza vaccine. This  is recommended every year. Tetanus, diphtheria, and acellular pertussis (Tdap, Td) vaccine. You may need a Td booster every 10 years. Zoster vaccine. You may need this after age 63. Pneumococcal 13-valent conjugate (PCV13) vaccine. One dose is recommended after age 90. Pneumococcal polysaccharide (PPSV23) vaccine. One dose is recommended after age 62. Talk to your health care provider about which screenings and vaccines you need and how often you need them. This information is not intended to replace advice given to you by your health care provider. Make sure you discuss any questions you have with your health care provider. Document Released: 10/19/2015 Document Revised: 06/11/2016 Document Reviewed: 07/24/2015 Elsevier Interactive Patient Education  2017 [employer].  Fall Prevention in the Home Falls can cause injuries. They can happen to people of all ages. There are many things you can do to make your home safe and to help prevent falls. What can I do on the outside of my home? Regularly fix the edges of walkways and driveways and fix any cracks. Remove anything that might make you trip as you walk through a door, such as a raised step or threshold. Trim any bushes or trees on the path to your home. Use bright outdoor lighting. Clear any walking paths of anything that might make someone trip, such as rocks or tools. Regularly check to see if handrails are loose or broken. Make sure that both sides of any steps have handrails. Any raised decks and porches should have guardrails on the edges. Have any leaves, snow, or ice cleared regularly. Use sand or salt on walking paths during winter. Clean up any spills in your garage right away. This includes oil or grease spills. What can I do in the bathroom? Use night lights. Install grab bars by the toilet and in the tub and shower. Do not use towel bars as grab bars. Use non-skid mats or decals in the tub or shower. If you need to sit down  in the shower, use a plastic, non-slip stool. Keep the floor dry. Clean up any water that spills on the floor as soon as it happens. Remove soap buildup in the tub or shower regularly. Attach bath mats securely with double-sided non-slip rug tape. Do not have throw rugs and other things on the floor that can make you trip. What can I do in the bedroom? Use night lights. Make sure that you have a light by your bed that is easy to reach. Do not use any sheets or blankets that are too big for your bed. They should not hang down onto the floor. Have a firm chair that has side arms. You can use this for support while you get dressed. Do not have throw rugs and other things on the floor that can make you trip. What can I do in the kitchen? Clean up any spills right away. Avoid walking on wet floors. Keep items that you use a lot in easy-to-reach places. If you need to reach something above you, use a strong step stool that has a grab bar. Keep electrical cords out of the way. Do not use floor polish or wax that makes floors  slippery. If you must use wax, use non-skid floor wax. Do not have throw rugs and other things on the floor that can make you trip. What can I do with my stairs? Do not leave any items on the stairs. Make sure that there are handrails on both sides of the stairs and use them. Fix handrails that are broken or loose. Make sure that handrails are as long as the stairways. Check any carpeting to make sure that it is firmly attached to the stairs. Fix any carpet that is loose or worn. Avoid having throw rugs at the top or bottom of the stairs. If you do have throw rugs, attach them to the floor with carpet tape. Make sure that you have a light switch at the top of the stairs and the bottom of the stairs. If you do not have them, ask someone to add them for you. What else can I do to help prevent falls? Wear shoes that: Do not have high heels. Have rubber bottoms. Are comfortable  and fit you well. Are closed at the toe. Do not wear sandals. If you use a stepladder: Make sure that it is fully opened. Do not climb a closed stepladder. Make sure that both sides of the stepladder are locked into place. Ask someone to hold it for you, if possible. Clearly mark and make sure that you can see: Any grab bars or handrails. First and last steps. Where the edge of each step is. Use tools that help you move around (mobility aids) if they are needed. These include: Canes. Walkers. Scooters. Crutches. Turn on the lights when you go into a dark area. Replace any light bulbs as soon as they burn out. Set up your furniture so you have a clear path. Avoid moving your furniture around. If any of your floors are uneven, fix them. If there are any pets around you, be aware of where they are. Review your medicines with your doctor. Some medicines can make you feel dizzy. This can increase your chance of falling. Ask your doctor what other things that you can do to help prevent falls. This information is not intended to replace advice given to you by your health care provider. Make sure you discuss any questions you have with your health care provider. Document Released: 07/19/2009 Document Revised: 02/28/2016 Document Reviewed: 10/27/2014 Elsevier Interactive Patient Education  2017 [employer].

## 2023-04-07 NOTE — Telephone Encounter (Signed)
Scripts sent in today °

## 2023-04-07 NOTE — Progress Notes (Signed)
Subjective:   James [name] is a 77 y.o. male who presents for Medicare Annual/Subsequent preventive examination.  Visit Complete: Virtual  I connected with  Keane Police on 04/07/23 by a audio enabled telemedicine application and verified that I am speaking with the correct person using two identifiers.  Patient Location: Home  Provider Location: Office/Clinic  I discussed the limitations of evaluation and management by telemedicine. The patient expressed understanding and agreed to proceed.  Review of Systems     Cardiac Risk Factors include: advanced age (>50men, >48 women);dyslipidemia;family history of premature cardiovascular disease;male gender     Objective:    Today's Vitals   04/07/23 1120  Weight: 151 lb 8 oz (68.7 kg)  Height: 5\' 6"  (1.676 m)  PainSc: 0-No pain   Body mass index is 24.45 kg/m.     04/07/2023   11:45 AM 07/10/2022    8:04 AM 01/07/2022    8:49 AM 03/18/2021    8:56 AM 10/11/2020    8:45 AM 01/12/2020   11:10 AM 07/13/2019    8:57 AM  Advanced Directives  Does Patient Have a Medical Advance Directive? Yes Yes Yes No Yes Yes Yes  Type of Estate agent of [place];Living will Healthcare Power of [place];Living will Living will;Healthcare Power of [employer] Power of [place];Living will Healthcare Power of [place];Living will Healthcare Power of [employer] [place];Living will  Does patient want to make changes to medical advance directive?  No - Patient declined No - Patient declined  No - Patient declined No - Patient declined   Copy of Healthcare Power of Attorney in Chart? No - copy requested No - copy requested No - copy requested  No - copy requested No - copy requested No - copy requested  Would patient like information on creating a medical advance directive?    No - Patient declined       Current Medications (verified) Outpatient Encounter Medications as of 04/07/2023  Medication Sig   acetaminophen (TYLENOL) 500 MG  tablet Take 1,000 mg by mouth every 6 (six) hours as needed for mild pain.    chlorpheniramine-HYDROcodone (TUSSIONEX) 10-8 MG/5ML Take 5 mLs by mouth every 12 (twelve) hours as needed.   Multiple Vitamin (MULTIVITAMIN WITH MINERALS) TABS tablet Take 1 tablet by mouth every other day.    omeprazole (PRILOSEC) 40 MG capsule TAKE 1 CAPSULE DAILY 30 MINS BEFORE BREAKFAST OR SUPPER, OKAY TO OPEN AND SPRINKLE ON APPLESAUCE   pravastatin (PRAVACHOL) 40 MG tablet Take 1 tablet (40 mg total) by mouth daily. TAKE 1 TABLET BY MOUTH EVERYDAY AT BEDTIME Strength: 40 mg   No facility-administered encounter medications on file as of 04/07/2023.    Allergies (verified) Codeine   History: Past Medical History:  Diagnosis Date   Abnormal EKG    NS ST-T EKG Changes, (-) Nuclear Stress Test 03/2006   Allergy    Anemia    iron deficency   Arthritis    B12 deficiency 06/05/2015   B12 Low NL at 271 methtylmalonic acid high   Cardiac murmur    nothing to be concerned with    Cataract    extractions   Duodenal ulcer 1972   transfusion required, 1 unit packed cells   Dupuytren's contracture    Dysphagia    history throat/stomach  cancer "can swallow some, uses more pureed type foods"   Dyspnea    at times   Esophageal reflux    no currlently   Gastric cancer (HCC)  07/18/2015   invasive adenocarcinoma   Gastrostomy tube in place [facility]) 2016   intermittent feeding   Hx of basal cell carcinoma    Hyperlipemia    no per pt 04-11-16   Melanoma (HCC)    Prostate cancer (HCC) 09/06/2014   last radiation 4'16    S/P radiation therapy 11/29/2014 through 01/23/2015                                                     Prostate 7800 cGy in 40 sessions, seminal vesicles 5600 cGy in 40 sessions                        Skin cancer, basal cell    Transfusion history    '72 -s/p surgery for duodenal ulcer, 12/17   Past Surgical History:  Procedure Laterality Date   BALLOON DILATION N/A 09/12/2015   Procedure:  BALLOON DILATION;  Surgeon: Iva Boop, MD;  Location: [facility];  Service: Endoscopy;  Laterality: N/A;   BALLOON DILATION N/A 09/21/2015   Procedure: BALLOON DILATION;  Surgeon: Iva Boop, MD;  Location: [facility];  Service: Endoscopy;  Laterality: N/A;   BALLOON DILATION N/A 10/04/2015   Procedure: BALLOON DILATION;  Surgeon: Rachael Fee, MD;  Location: [facility];  Service: Endoscopy;  Laterality: N/A;   BALLOON DILATION N/A 12/10/2015   Procedure: BALLOON DILATION;  Surgeon: Iva Boop, MD;  Location: [facility];  Service: Endoscopy;  Laterality: N/A;   BALLOON DILATION N/A 10/10/2016   Procedure: BALLOON DILATION;  Surgeon: Iva Boop, MD;  [facility]: [facility];  Service: Endoscopy;  Laterality: N/A;   CATARACT EXTRACTION, BILATERAL Bilateral    COLONOSCOPY     colonoscopy with polypectomy  02/2012   Dr Jarold Motto   ESOPHAGOGASTRODUODENOSCOPY (EGD) WITH PROPOFOL N/A 08/17/2015   Procedure: ESOPHAGOGASTRODUODENOSCOPY (EGD) WITH PROPOFOL;  Surgeon: Iva Boop, MD;  Location: [facility] ENDOSCOPY;  Service: Endoscopy;  Laterality: N/A;   ESOPHAGOGASTRODUODENOSCOPY (EGD) WITH PROPOFOL N/A 09/12/2015   Procedure: ESOPHAGOGASTRODUODENOSCOPY (EGD) WITH PROPOFOL;  Surgeon: Iva Boop, MD;  [facility];  Service: Endoscopy;  Laterality: N/A;   ESOPHAGOGASTRODUODENOSCOPY (EGD) WITH PROPOFOL N/A 09/21/2015   Procedure: ESOPHAGOGASTRODUODENOSCOPY (EGD) WITH PROPOFOL;  Surgeon: Iva Boop, MD;  [facility];  Service: Endoscopy;  Laterality: N/A;   ESOPHAGOGASTRODUODENOSCOPY (EGD) WITH PROPOFOL N/A 10/04/2015   Procedure: ESOPHAGOGASTRODUODENOSCOPY (EGD) WITH PROPOFOL;  Surgeon: Rachael Fee, MD;  [facility];  Service: Endoscopy;  Laterality: N/A;  possible stricture   ESOPHAGOGASTRODUODENOSCOPY (EGD) WITH PROPOFOL N/A 12/10/2015   Procedure: ESOPHAGOGASTRODUODENOSCOPY (EGD) WITH PROPOFOL;  Surgeon: Iva Boop, MD;  [facility];  Service: Endoscopy;  Laterality: N/A;   ESOPHAGOGASTRODUODENOSCOPY (EGD) WITH PROPOFOL N/A 02/06/2016   Procedure: ESOPHAGOGASTRODUODENOSCOPY (EGD) WITH PROPOFOL;  Surgeon: Iva Boop, MD;  [facility];  Service: Endoscopy;  Laterality: N/A;   ESOPHAGOGASTRODUODENOSCOPY (EGD) WITH PROPOFOL N/A 10/10/2016   Procedure: ESOPHAGOGASTRODUODENOSCOPY (EGD) WITH PROPOFOL;  Surgeon: Iva Boop, MD;  [facility];  Service: Endoscopy;  Laterality: N/A;  needs APC    EUS N/A 06/14/2015   Procedure: ESOPHAGEAL ENDOSCOPIC ULTRASOUND (EUS) RADIAL;  Surgeon: Rachael Fee, MD;  [facility];  Service: Endoscopy;  Laterality: N/A;   HOT HEMOSTASIS N/A 10/10/2016   Procedure: HOT HEMOSTASIS (  ARGON PLASMA COAGULATION/BICAP);  Surgeon: Iva Boop, MD;  Location: [facility];  Service: Endoscopy;  Laterality: N/A;   KNEE ARTHROSCOPY Right    Right Knee, [place] [facility]   LAPAROSCOPIC GASTRECTOMY N/A 07/18/2015   Procedure: SUB TOTAL GASTRECTOMY;  Surgeon: Almond Lint, MD;  Location: [facility];  Service: General;  Laterality: N/A;   LAPAROSCOPIC GASTROSTOMY N/A 07/18/2015   Procedure: FEEDING TUBE PLACEMENT;  Surgeon: Almond Lint, MD;  Location: [facility];  Service: General;  Laterality: N/A;   LAPAROSCOPY N/A 07/18/2015   Procedure: LAPAROSCOPY DIAGNOSTIC;  Surgeon: Almond Lint, MD;  Location: [facility];  Service: General;  Laterality: N/A;   MELANOMA EXCISION  02-01-16   abdominal wall   MOHS SURGERY  05/2020   nose   PROSTATE BIOPSY  09/06/14   ROTATOR CUFF REPAIR Right    R shoulder, [place]   SAVORY DILATION N/A 02/06/2016   Procedure: SAVORY DILATION;  Surgeon: Iva Boop, MD;  Location: [facility];  Service: Endoscopy;  Laterality: N/A;   TOTAL SHOULDER ARTHROPLASTY Left 05/07/2017   Procedure: LEFT REVERSE TOTAL SHOULDER ARTHROPLASTY;  Surgeon: Francena Hanly, MD;  Location: [facility];  Service: Orthopedics;  Laterality: Left;   TRIGGER FINGER RELEASE Left 1998    UPPER GASTROINTESTINAL ENDOSCOPY     x14   VASECTOMY     Family History  Problem Relation Age of Onset   Diabetes Father    Heart attack Father 54   Liver disease Father        Related to Alcohol Use    Alcohol abuse Mother    Cirrhosis Mother        died in her 71s   Multiple myeloma Brother    Diabetes Sister    Colon cancer Neg Hx    Stomach cancer Neg Hx    Stroke Neg Hx    Esophageal cancer Neg Hx    Rectal cancer Neg Hx    Social History   Socioeconomic History   Marital status: Married    Spouse name: Administrator, Civil Service   Number of children: 2   Years of education: Not on file   Highest education level: Not on file  Occupational History   Occupation: retired  Tobacco Use   Smoking status: Former    Packs/day: 0.50    Years: 5.00    Additional pack years: 0.00    Total pack years: 2.50    Types: Cigarettes    Quit date: 10/07/1967    Years since quitting: 55.5   Smokeless tobacco: Never   Tobacco comments:    smoked 1963- 1969 , up to < 1 ppd  Vaping Use   Vaping Use: Never used  Substance and Sexual Activity   Alcohol use: Yes    Comment: maybe once a week   Drug use: No   Sexual activity: Yes  Other Topics Concern   Not on file  Social History Narrative   Married and retired - 1 son and 1 daughter    Lives at [place] and active golfer, Armed forces operational officer.  Second home at [facility], [employer] condominium   Very occasional alcohol, former smoker   Social Determinants of Health   Financial Resource Strain: Low Risk  (04/07/2023)   Overall Financial Resource Strain (CARDIA)    Difficulty of Paying Living Expenses: Not hard at all  Food Insecurity: No Food Insecurity (04/07/2023)   Hunger Vital Sign    Worried About Running Out of Food in the Last Year: Never  true    Ran Out of Food in the Last Year: Never true  Transportation Needs: No Transportation Needs (04/07/2023)   PRAPARE - Administrator, Civil Service (Medical): No    Lack of  Transportation (Non-Medical): No  Physical Activity: Sufficiently Active (04/07/2023)   Exercise Vital Sign    Days of Exercise per Week: 7 days    Minutes of Exercise per Session: 30 min  Stress: No Stress Concern Present (04/07/2023)   [employer] of Occupational Health - Occupational Stress Questionnaire    Feeling of Stress : Not at all  Social Connections: Socially Integrated (04/07/2023)   Social Connection and Isolation Panel [NHANES]    Frequency of Communication with Friends and Family: More than three times a week    Frequency of Social Gatherings with Friends and Family: More than three times a week    Attends Religious Services: More than 4 times per year    Active Member of [employer] or Organizations: Yes    Attends Engineer, structural: More than 4 times per year    Marital Status: Married    Tobacco Counseling Counseling given: Not Answered Tobacco comments: smoked 1963- 1969 , up to < 1 ppd   Clinical Intake:  Pre-visit preparation completed: Yes  Pain : No/denies pain Pain Score: 0-No pain     BMI - recorded: 24.45 Nutritional Status: BMI of 19-24  Normal Nutritional Risks: None Diabetes: No  How often do you need to have someone help you when you read instructions, pamphlets, or other written materials from your doctor or pharmacy?: 1 - Never What is the last grade level you completed in school?: [employer]  Interpreter Needed?: No  Information entered by :: Edana Aguado N. Sheri Gatchel, LPN.   Activities of Daily Living    04/07/2023   11:47 AM  In your present state of health, do you have any difficulty performing the following activities:  Hearing? 0  Vision? 0  Difficulty concentrating or making decisions? 0  Walking or climbing stairs? 0  Dressing or bathing? 0  Doing errands, shopping? 0  Preparing Food and eating ? N  Using the Toilet? N  In the past six months, have you accidently leaked urine? N  Do you have problems with loss of  bowel control? N  Managing your Medications? N  Managing your Finances? N  Housekeeping or managing your Housekeeping? N    Patient Care Team: Pincus Sanes, MD as PCP - General (Internal Medicine) Ladene Artist, MD as Consulting Physician (Oncology) Iva Boop, MD as Consulting Physician (Gastroenterology) Jethro Bolus, MD as Consulting Physician (Ophthalmology)  Indicate any recent Medical Services you may have received from other than [facility] providers in the past year (date may be approximate).     Assessment:   This is a routine wellness examination for Ioanis.  Hearing/Vision screen Hearing Screening - Comments:: Patient has hearing difficulty and wears hearing aids. Vision Screening - Comments:: Patient does wear otc readers.  Eye exam done by: Jethro Bolus, MD.   Dietary issues and exercise activities discussed:     Goals Addressed             This Visit's Progress    My healthcare goal for 2024 is to stay healthy, physically active by playing tennis/golf and keeping my B12 injections current.        Depression Screen    04/07/2023   11:38 AM 10/17/2022    7:54 AM 01/07/2022  8:58 AM 07/13/2019    9:15 AM 06/29/2018    8:34 AM 03/24/2017    2:14 PM 03/24/2017    2:04 PM  PHQ 2/9 Scores  PHQ - 2 Score 0 0 0 0 0 0 0  PHQ- 9 Score 0     3     Fall Risk    04/07/2023   11:47 AM 10/17/2022    7:54 AM 01/07/2022    8:52 AM 02/15/2021    2:08 PM 07/13/2019    9:15 AM  Fall Risk   Falls in the past year? 1 0 0 0 0  Number falls in past yr: 0 0 0 0 0  Injury with Fall? 0 0 0 0 0  Risk for fall due to : No Fall Risks No Fall Risks No Fall Risks No Fall Risks   Risk for fall due to: Comment fell playing tennis; no injury or ER visit      Follow up Falls prevention discussed Falls evaluation completed Falls evaluation completed Falls evaluation completed     MEDICARE RISK AT HOME:  Medicare Risk at Home - 04/07/23 1148     Any stairs in or around the home?  Yes    If so, are there any without handrails? No    Home free of loose throw rugs in walkways, pet beds, electrical cords, etc? Yes    Adequate lighting in your home to reduce risk of falls? Yes    Life alert? No    Use of a cane, walker or w/c? No    Grab bars in the bathroom? No    Shower chair or bench in shower? Yes    Elevated toilet seat or a handicapped toilet? Yes             TIMED UP AND GO:  Was the test performed?  No    Cognitive Function:        04/07/2023   11:48 AM 01/07/2022    9:01 AM  6CIT Screen  What Year? 0 points 0 points  What month? 0 points 0 points  What time? 0 points 0 points  Count back from 20 0 points 0 points  Months in reverse 0 points 0 points  Repeat phrase 0 points 0 points  Total Score 0 points 0 points    Immunizations Immunization History  Administered Date(s) Administered   Fluad Quad(high Dose 65+) 06/16/2019, 09/03/2020, 06/12/2021, 07/10/2022   Influenza Whole 08/07/2009   Influenza, High Dose Seasonal PF 07/05/2015, 10/07/2015, 06/30/2016, 06/06/2017, 07/01/2017, 06/29/2018   Influenza,inj,Quad PF,6+ Mos 09/20/2013   Influenza-Unspecified 07/05/2016, 07/01/2017, 06/07/2019, 10/06/2020   PFIZER Comirnaty(Gray Top)Covid-19 Tri-Sucrose Vaccine 03/12/2021   PFIZER(Purple Top)SARS-COV-2 Vaccination 10/28/2019, 11/18/2019, 08/04/2020   Pneumococcal Conjugate-13 05/04/2014, 07/05/2016   Pneumococcal Polysaccharide-23 12/27/2010, 06/30/2016   Td 12/21/2009   Tdap 08/26/2016, 04/18/2020   Zoster Recombinant(Shingrix) 11/24/2017   Zoster, Live 04/15/2010    TDAP status: Up to date  Flu Vaccine status: Up to date  Pneumococcal vaccine status: Up to date  Covid-19 vaccine status: Completed vaccines  Qualifies for Shingles Vaccine? Yes   Zostavax completed Yes   Shingrix Completed?: Yes  Screening Tests Health Maintenance  Topic Date Due   Zoster Vaccines- Shingrix (2 of 2) 01/19/2018   COVID-19 Vaccine (5 - 2023-24  season) 06/06/2022   INFLUENZA VACCINE  05/07/2023   Medicare Annual Wellness (AWV)  04/06/2024   DTaP/Tdap/Td (4 - Td or Tdap) 04/18/2030   Pneumonia Vaccine 60+ Years old  Completed  Hepatitis C Screening  Completed   HPV VACCINES  Aged Out   Colonoscopy  Discontinued    Health Maintenance  Health Maintenance Due  Topic Date Due   Zoster Vaccines- Shingrix (2 of 2) 01/19/2018   COVID-19 Vaccine (5 - 2023-24 season) 06/06/2022    Colorectal cancer screening: No longer required.   Lung Cancer Screening: (Low Dose CT Chest recommended if Age 52-80 years, 20 pack-year currently smoking OR have quit w/in 15years.) does not qualify.   Lung Cancer Screening Referral: no  Additional Screening:  Hepatitis C Screening: does qualify; Completed 12/16/2013  Vision Screening: Recommended annual ophthalmology exams for early detection of glaucoma and other disorders of the eye. Is the patient up to date with their annual eye exam?  Yes  Who is the provider or what is the name of the office in which the patient attends annual eye exams? Jethro Bolus, MD. If pt is not established with a provider, would they like to be referred to a provider to establish care? No .   Dental Screening: Recommended annual dental exams for proper oral hygiene  Diabetic Foot Exam: N/A  Community Resource Referral / Chronic Care Management: CRR required this visit?  No   CCM required this visit?  No     Plan:     I have personally reviewed and noted the following in the patient's chart:   Medical and social history Use of alcohol, tobacco or illicit drugs  Current medications and supplements including opioid prescriptions. Patient is not currently taking opioid prescriptions. Functional ability and status Nutritional status Physical activity Advanced directives List of other physicians Hospitalizations, surgeries, and ER visits in previous 12 months Vitals Screenings to include cognitive,  depression, and falls Referrals and appointments  In addition, I have reviewed and discussed with patient certain preventive protocols, quality metrics, and best practice recommendations. A written personalized care plan for preventive services as well as general preventive health recommendations were provided to patient.     Mickeal Needy, LPN   01/08/4097   After Visit Summary: (Mail) Due to this being a telephonic visit, the after visit summary with patients personalized plan was offered to patient via mail   Nurse Notes: Normal cognitive status assessed by direct observation via telephone conversation by this Nurse Health Advisor. No abnormalities found.

## 2023-05-01 ENCOUNTER — Ambulatory Visit (INDEPENDENT_AMBULATORY_CARE_PROVIDER_SITE_OTHER): Payer: Medicare Other

## 2023-05-01 DIAGNOSIS — E538 Deficiency of other specified B group vitamins: Secondary | ICD-10-CM | POA: Diagnosis not present

## 2023-05-01 MED ORDER — CYANOCOBALAMIN 1000 MCG/ML IJ SOLN
1000.0000 ug | Freq: Once | INTRAMUSCULAR | Status: AC
Start: 2023-05-01 — End: 2023-05-01
  Administered 2023-05-01: 1000 ug via INTRAMUSCULAR

## 2023-05-01 NOTE — Progress Notes (Signed)
Patient here for monthly B12 injection per Dr. Quay Burow. B12 1000 mcg given in right IM and patient tolerated injection well today.

## 2023-05-06 ENCOUNTER — Encounter (INDEPENDENT_AMBULATORY_CARE_PROVIDER_SITE_OTHER): Payer: Self-pay

## 2023-05-06 ENCOUNTER — Ambulatory Visit (INDEPENDENT_AMBULATORY_CARE_PROVIDER_SITE_OTHER): Payer: Medicare Other | Admitting: Emergency Medicine

## 2023-05-06 ENCOUNTER — Encounter: Payer: Self-pay | Admitting: Emergency Medicine

## 2023-05-06 VITALS — BP 100/60 | HR 50 | Temp 97.8°F | Ht 66.0 in | Wt 148.1 lb

## 2023-05-06 DIAGNOSIS — C160 Malignant neoplasm of cardia: Secondary | ICD-10-CM

## 2023-05-06 DIAGNOSIS — C61 Malignant neoplasm of prostate: Secondary | ICD-10-CM

## 2023-05-06 DIAGNOSIS — R7303 Prediabetes: Secondary | ICD-10-CM | POA: Insufficient documentation

## 2023-05-06 DIAGNOSIS — R5383 Other fatigue: Secondary | ICD-10-CM

## 2023-05-06 DIAGNOSIS — R399 Unspecified symptoms and signs involving the genitourinary system: Secondary | ICD-10-CM | POA: Diagnosis not present

## 2023-05-06 LAB — CBC WITH DIFFERENTIAL/PLATELET
Basophils Absolute: 0 10*3/uL (ref 0.0–0.1)
Basophils Relative: 0.6 % (ref 0.0–3.0)
Eosinophils Absolute: 0.4 10*3/uL (ref 0.0–0.7)
Eosinophils Relative: 8.5 % — ABNORMAL HIGH (ref 0.0–5.0)
HCT: 40.3 % (ref 39.0–52.0)
Hemoglobin: 13 g/dL (ref 13.0–17.0)
Lymphocytes Relative: 32.5 % (ref 12.0–46.0)
Lymphs Abs: 1.7 10*3/uL (ref 0.7–4.0)
MCHC: 32.2 g/dL (ref 30.0–36.0)
MCV: 91.3 fl (ref 78.0–100.0)
Monocytes Absolute: 0.5 10*3/uL (ref 0.1–1.0)
Monocytes Relative: 9.9 % (ref 3.0–12.0)
Neutro Abs: 2.6 10*3/uL (ref 1.4–7.7)
Neutrophils Relative %: 48.5 % (ref 43.0–77.0)
Platelets: 217 10*3/uL (ref 150.0–400.0)
RBC: 4.41 Mil/uL (ref 4.22–5.81)
RDW: 16.7 % — ABNORMAL HIGH (ref 11.5–15.5)
WBC: 5.3 10*3/uL (ref 4.0–10.5)

## 2023-05-06 LAB — COMPREHENSIVE METABOLIC PANEL
ALT: 10 U/L (ref 0–53)
AST: 28 U/L (ref 0–37)
Albumin: 4 g/dL (ref 3.5–5.2)
Alkaline Phosphatase: 52 U/L (ref 39–117)
BUN: 16 mg/dL (ref 6–23)
CO2: 29 mEq/L (ref 19–32)
Calcium: 9.1 mg/dL (ref 8.4–10.5)
Chloride: 105 mEq/L (ref 96–112)
Creatinine, Ser: 0.83 mg/dL (ref 0.40–1.50)
GFR: 83.88 mL/min (ref >60.00)
Glucose, Bld: 101 mg/dL — ABNORMAL HIGH (ref 70–99)
Potassium: 3.8 mEq/L (ref 3.5–5.1)
Sodium: 140 mEq/L (ref 135–145)
Total Bilirubin: 0.5 mg/dL (ref 0.2–1.2)
Total Protein: 6.8 g/dL (ref 6.0–8.3)

## 2023-05-06 LAB — POCT URINALYSIS DIP (MANUAL ENTRY)
Blood, UA: NEGATIVE
Glucose, UA: NEGATIVE mg/dL
Ketones, POC UA: NEGATIVE mg/dL
Leukocytes, UA: NEGATIVE
Nitrite, UA: NEGATIVE
Protein Ur, POC: NEGATIVE mg/dL — AB
Spec Grav, UA: >=1.03 — AB (ref 1.010–1.025)
Urobilinogen, UA: 0.2 E.U./dL
pH, UA: 6 (ref 5.0–8.0)

## 2023-05-06 LAB — HEMOGLOBIN A1C: Hgb A1c MFr Bld: 6.2 % (ref 4.6–6.5)

## 2023-05-06 MED ORDER — CIPROFLOXACIN HCL 500 MG PO TABS
500.0000 mg | ORAL_TABLET | Freq: Two times a day (BID) | ORAL | 0 refills | Status: AC
Start: 2023-05-06 — End: 2023-05-13

## 2023-05-06 NOTE — Assessment & Plan Note (Signed)
Blood work done today including hemoglobin A1c Increase urination could be related to glucosuria

## 2023-05-06 NOTE — Assessment & Plan Note (Signed)
Stable.  Follows up with oncologist on a regular basis [facility].

## 2023-05-06 NOTE — Assessment & Plan Note (Signed)
T1c prostate cancer status post external beam radiation completed April 2016

## 2023-05-06 NOTE — Patient Instructions (Signed)
Urinary Frequency, Adult Urinary frequency means urinating more often than usual. You may urinate every 1-2 hours even though you drink a normal amount of fluid and do not have a bladder infection or condition. Although you urinate more often than normal, the total amount of urine produced in a day is normal. With urinary frequency, you may have an urgent need to urinate often. The stress and anxiety of needing to find a bathroom quickly can make this urge worse. This condition may go away on its own, or you may need treatment at home. Home treatment may include bladder training, exercises, taking medicines, or making changes to your diet. Follow these instructions at home: Bladder health Your health care provider will tell you what to do to improve bladder health. You may be told to: Keep a bladder diary. Keep track of: What you eat and drink. How often you urinate. How much you urinate. Follow a bladder training program. This may include: Learning to delay going to the bathroom. Double urinating, also called voiding. This helps if you are not completely emptying your bladder. Scheduled voiding. Do Kegel exercises. Kegel exercises strengthen the muscles that help control urination, which may help the condition.  Eating and drinking Follow instructions from your health care provider about eating or drinking restrictions. You may be told to: Avoid caffeine. Drink fewer fluids, especially alcohol. Avoid drinking in the evening. Avoid foods or drinks that may irritate the bladder. These include coffee, tea, soda, artificial sweeteners, citrus, tomato-based foods, and chocolate. Eat foods that help prevent or treat constipation. Constipation can make urinary frequency worse. You may need to take these actions to prevent or treat constipation: Drink enough fluid to keep your urine pale yellow. Take over-the-counter or prescription medicines. Eat foods that are high in fiber, such as beans, whole  grains, and fresh fruits and vegetables. Limit foods that are high in fat and processed sugars, such as fried or sweet foods. General instructions Take over-the-counter and prescription medicines only as told by your health care provider. Keep all follow-up visits. This is important. Contact a health care provider if: You start urinating more often. You feel pain or irritation when you urinate. You notice blood in your urine. Your urine looks cloudy. You develop a fever. You begin vomiting. Get help right away if: You are unable to urinate. Summary Urinary frequency means urinating more often than usual. With urinary frequency, you may urinate every 1-2 hours even though you drink a normal amount of fluid and do not have a bladder infection or other bladder condition. Your health care provider may recommend that you keep a bladder diary, follow a bladder training program, or make dietary changes. If told by your health care provider, do Kegel exercises to strengthen the muscles that help control urination. Take over-the-counter and prescription medicines only as told by your health care provider. Contact a health care provider if your symptoms do not improve or get worse. This information is not intended to replace advice given to you by your health care provider. Make sure you discuss any questions you have with your health care provider. Document Revised: 04/27/2020 Document Reviewed: 04/27/2020 Elsevier Patient Education  2024 Elsevier Inc.  

## 2023-05-06 NOTE — Assessment & Plan Note (Signed)
Clinically stable.  No red flag signs or symptoms. Stable vital signs.  Unremarkable exam. Differential diagnosis discussed Recommend blood work today

## 2023-05-06 NOTE — Assessment & Plan Note (Signed)
Unremarkable urinalysis Urine sent for culture History of prostate cancer Recommend to start Cipro 500 mg twice a day for 7 days at least until culture is back.? Prostate infection Recommend [facility]rology evaluation [facility]

## 2023-05-06 NOTE — Progress Notes (Signed)
Keane Police 77 y.o.   Chief Complaint  Patient presents with   Urinary Tract Infection    Patient states he has frequent urination, no burning, painful, little discomfort, fatigued as well x 2 weeks     HISTORY OF PRESENT ILLNESS: This is a 77 y.o. male complaining of "uncomfortable urination" that started 2 weeks ago.  Complaining of urinary frequency.  Denies burning with urination.  Denies blood in the urine.  Remote history of prostate cancer treated with surgery and radiation.  Does not recall all the details. History of gastric cancer. Complaining of general fatigue for the past month.  However he was still able to play tennis last night for an hour and a half. Denies recent flulike symptoms.  Denies intentional weight loss.  Still has good appetite.  No other associated symptoms No other complaints or medical concerns today.   Urinary Tract Infection  Associated symptoms include frequency. Pertinent negatives include no chills, flank pain, hematuria, nausea or vomiting.     Prior to Admission medications   Medication Sig Start Date End Date Taking? Authorizing Provider  acetaminophen (TYLENOL) 500 MG tablet Take 1,000 mg by mouth every 6 (six) hours as needed for mild pain.    Yes [provider]  chlorpheniramine-HYDROcodone (TUSSIONEX) 10-8 MG/5ML Take 5 mLs by mouth every 12 (twelve) hours as needed. 10/17/22  Yes Burns, Bobette Mo, MD  Multiple Vitamin (MULTIVITAMIN WITH MINERALS) TABS tablet Take 1 tablet by mouth every other day.    Yes [provider]  omeprazole (PRILOSEC) 40 MG capsule TAKE 1 CAPSULE DAILY 30 MINS BEFORE BREAKFAST OR SUPPER, OKAY TO OPEN AND SPRINKLE ON APPLESAUCE 04/07/23  Yes Burns, Bobette Mo, MD  pravastatin (PRAVACHOL) 40 MG tablet Take 1 tablet (40 mg total) by mouth daily. 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   GERD (gastroesophageal reflux disease) 02/02/2020   Left breast lump 07/15/2018   Hyperglycemia 07/07/2018   S/p reverse total shoulder arthroplasty 05/07/2017   Gastric AVM    Thrombocytopenia (HCC) 05/24/2016   Malignant melanoma (HCC) 01/08/2016   Anastomotic stricture of stomach    Esophageal stricture    Carcinoma of lesser curvature of stomach (HCC) 07/18/2015   Gastric cancer (HCC) 06/08/2015   B12 deficiency 06/05/2015   Prostate cancer (HCC) 10/24/2014   Vitamin D deficiency 06/03/2014   Basal cell cancer 09/17/2012   HEARING DEFICIT 12/21/2009   NONSPECIFIC ABNORMAL ELECTROCARDIOGRAM 12/21/2009   History of colonic polyps 12/04/2008   HYPERLIPIDEMIA 08/23/2007    Past Medical History:  Diagnosis Date   Abnormal EKG    NS ST-T EKG Changes, (-) Nuclear Stress Test 03/2006   Allergy    Anemia    iron deficency   Arthritis    B12 deficiency 06/05/2015   B12 Low NL at 271 methtylmalonic acid high   Cardiac murmur    nothing to be concerned with    Cataract    extractions   Duodenal ulcer 1972   transfusion required, 1 unit packed cells   Dupuytren's contracture    Dysphagia    history throat/stomach  cancer "can swallow some, uses more  pureed type foods"   Dyspnea    at times   Esophageal reflux    no currlently   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   Gastrostomy tube in place [facility]) 2016   intermittent feeding   Hx of basal cell carcinoma    Hyperlipemia    no per pt 04-11-16   Melanoma (HCC)    Prostate cancer (HCC) 09/06/2014   last radiation 4'16    S/P radiation therapy 11/29/2014 through 01/23/2015                                                     Prostate 7800 cGy in 40 sessions, seminal vesicles 5600 cGy in  40 sessions                        Skin cancer, basal cell    Transfusion history    '72 -s/p surgery for duodenal ulcer, 12/17    Past Surgical History:  Procedure Laterality Date   BALLOON DILATION N/A 09/12/2015   Procedure: BALLOON DILATION;  Surgeon: Iva Boop, MD;  Location: [facility];  Service: Endoscopy;  Laterality: N/A;   BALLOON DILATION N/A 09/21/2015   Procedure: BALLOON DILATION;  Surgeon: Iva Boop, MD;  Location: [facility];  Service: Endoscopy;  Laterality: N/A;   BALLOON DILATION N/A 10/04/2015   Procedure: BALLOON DILATION;  Surgeon: Rachael Fee, MD;  Location: [facility];  Service: Endoscopy;  Laterality: N/A;   BALLOON DILATION N/A 12/10/2015   Procedure: BALLOON DILATION;  Surgeon: Iva Boop, MD;  Location: [facility];  Service: Endoscopy;  Laterality: N/A;   BALLOON DILATION N/A 10/10/2016   Procedure: BALLOON DILATION;  Surgeon: Iva Boop, MD;  Location: [facility];  Service: Endoscopy;  Laterality: N/A;   CATARACT EXTRACTION, BILATERAL Bilateral    COLONOSCOPY     colonoscopy with polypectomy  02/2012   Dr Jarold Motto   ESOPHAGOGASTRODUODENOSCOPY (EGD) WITH PROPOFOL N/A 08/17/2015   Procedure: ESOPHAGOGASTRODUODENOSCOPY (EGD) WITH PROPOFOL;  Surgeon: Iva Boop, MD;  Location: [facility];  Service: Endoscopy;  Laterality: N/A;   ESOPHAGOGASTRODUODENOSCOPY (EGD) WITH PROPOFOL N/A 09/12/2015   Procedure: ESOPHAGOGASTRODUODENOSCOPY (EGD) WITH PROPOFOL;  Surgeon: Iva Boop, MD;  [facility]: [facility];  Service: Endoscopy;  Laterality: N/A;   ESOPHAGOGASTRODUODENOSCOPY (EGD) WITH PROPOFOL N/A 09/21/2015   Procedure: ESOPHAGOGASTRODUODENOSCOPY (EGD) WITH PROPOFOL;  Surgeon: Iva Boop, MD;  [facility];  Service: Endoscopy;  Laterality: N/A;   ESOPHAGOGASTRODUODENOSCOPY (EGD) WITH PROPOFOL N/A 10/04/2015   Procedure: ESOPHAGOGASTRODUODENOSCOPY (EGD) WITH PROPOFOL;  Surgeon: Rachael Fee, MD;  [facility];  Service: Endoscopy;  Laterality: N/A;  possible stricture   ESOPHAGOGASTRODUODENOSCOPY (EGD) WITH PROPOFOL N/A 12/10/2015   Procedure: ESOPHAGOGASTRODUODENOSCOPY (EGD) WITH PROPOFOL;  Surgeon: Iva Boop, MD;  [facility];  Service: Endoscopy;  Laterality: N/A;   ESOPHAGOGASTRODUODENOSCOPY (EGD) WITH PROPOFOL N/A 02/06/2016   Procedure: ESOPHAGOGASTRODUODENOSCOPY (EGD) WITH PROPOFOL;  Surgeon: Iva Boop, MD;  [facility];  Service: Endoscopy;  Laterality: N/A;   ESOPHAGOGASTRODUODENOSCOPY (EGD) WITH PROPOFOL N/A 10/10/2016   Procedure: ESOPHAGOGASTRODUODENOSCOPY (EGD) WITH PROPOFOL;  Surgeon: Iva Boop, MD;  [facility];  Service: Endoscopy;  Laterality: N/A;  needs APC    EUS N/A 06/14/2015   Procedure: ESOPHAGEAL ENDOSCOPIC ULTRASOUND (EUS) RADIAL;  Surgeon: Rachael Fee, MD;  Location: [facility];  Service: Endoscopy;  Laterality: N/A;   HOT HEMOSTASIS N/A 10/10/2016   Procedure: HOT HEMOSTASIS (ARGON PLASMA COAGULATION/BICAP);  Surgeon: Iva Boop, MD;  Location: [facility];  Service: Endoscopy;  Laterality: N/A;   KNEE ARTHROSCOPY Right    Right Knee, [place] [facility]   LAPAROSCOPIC GASTRECTOMY N/A 07/18/2015   Procedure: SUB TOTAL GASTRECTOMY;  Surgeon: Almond Lint, MD;  Location: [facility];  Service: General;  Laterality: N/A;   LAPAROSCOPIC GASTROSTOMY N/A 07/18/2015   Procedure: FEEDING TUBE PLACEMENT;  Surgeon: Almond Lint, MD;  Location: [facility];  Service: General;  Laterality: N/A;   LAPAROSCOPY N/A 07/18/2015   Procedure: LAPAROSCOPY DIAGNOSTIC;  Surgeon: Almond Lint, MD;  Location: [facility];  Service: General;  Laterality: N/A;   MELANOMA EXCISION  02-01-16   abdominal wall   MOHS SURGERY  05/2020   nose   PROSTATE BIOPSY  09/06/14   ROTATOR CUFF REPAIR Right    R shoulder, [place]   SAVORY DILATION N/A 02/06/2016   Procedure: SAVORY DILATION;  Surgeon: Iva Boop, MD;  Location: [facility];  Service: Endoscopy;   Laterality: N/A;   TOTAL SHOULDER ARTHROPLASTY Left 05/07/2017   Procedure: LEFT REVERSE TOTAL SHOULDER ARTHROPLASTY;  Surgeon: Francena Hanly, MD;  Location: [facility];  Service: Orthopedics;  Laterality: Left;   TRIGGER FINGER RELEASE Left 1998   UPPER GASTROINTESTINAL ENDOSCOPY     x14   VASECTOMY      Social History   Socioeconomic History   Marital status: Married    Spouse name: Lynden Ang   Number of children: 2   Years of education: Not on file   Highest education level: Not on file  Occupational History   Occupation: retired  Tobacco Use   Smoking status: Former    Current packs/day: 0.00    Average packs/day: 0.5 packs/day for 5.0 years (2.5 ttl pk-yrs)    Types: Cigarettes    Start date: 10/06/1962    Quit date: 10/07/1967    Years since quitting: 55.6   Smokeless tobacco: Never   Tobacco comments:    smoked 1963- 1969 , up to < 1 ppd  Vaping Use   Vaping status: Never Used  Substance and Sexual Activity   Alcohol use: Yes    Comment: maybe once a week   Drug use: No   Sexual activity: Yes  Other Topics Concern   Not on file  Social History Narrative   Married and retired - 1 son and 1 daughter    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  Second home at [facility], [employer] condominium   Very occasional alcohol, former smoker   Social Determinants of Health   Financial Resource Strain: Low Risk  (04/07/2023)   Overall Financial Resource Strain (CARDIA)    Difficulty of Paying Living Expenses: Not hard at all  Food Insecurity: No Food Insecurity (04/07/2023)   Hunger Vital Sign    Worried About Running Out of Food in the Last Year: Never true    Ran Out of Food in the Last Year: Never true  Transportation Needs: No Transportation Needs (04/07/2023)   PRAPARE - Administrator, Civil Service (Medical): No    Lack of Transportation (Non-Medical): No  Physical Activity: Sufficiently Active (04/07/2023)   Exercise Vital Sign    Days of  Exercise per Week: 7 days    Minutes of Exercise per Session: 30 min  Stress:  No Stress Concern Present (04/07/2023)   [employer] of Occupational Health - Occupational Stress Questionnaire    Feeling of Stress : Not at all  Social Connections: Socially Integrated (04/07/2023)   Social Connection and Isolation Panel [NHANES]    Frequency of Communication with Friends and Family: More than three times a week    Frequency of Social Gatherings with Friends and Family: More than three times a week    Attends Religious Services: More than 4 times per year    Active Member of [employer] or Organizations: Yes    Attends Engineer, structural: More than 4 times per year    Marital Status: Married  Catering manager Violence: Not At Risk (04/07/2023)   Humiliation, Afraid, Rape, and Kick questionnaire    Fear of Current or Ex-Partner: No    Emotionally Abused: No    Physically Abused: No    Sexually Abused: No    Family History  Problem Relation Age of Onset   Diabetes Father    Heart attack Father 32   Liver disease Father        Related to Alcohol Use    Alcohol abuse Mother    Cirrhosis Mother        died in her 10s   Multiple myeloma Brother    Diabetes Sister    Colon cancer Neg Hx    Stomach cancer Neg Hx    Stroke Neg Hx    Esophageal cancer Neg Hx    Rectal cancer Neg Hx      Review of Systems  Constitutional:  Positive for malaise/fatigue. Negative for chills and fever.  HENT: Negative.  Negative for congestion and sore throat.   Respiratory: Negative.  Negative for cough and shortness of breath.   Cardiovascular: Negative.  Negative for chest pain and palpitations.  Gastrointestinal:  Negative for abdominal pain, diarrhea, nausea and vomiting.  Genitourinary:  Positive for frequency. Negative for dysuria, flank pain and hematuria.  Skin: Negative.  Negative for rash.  Neurological: Negative.  Negative for dizziness and headaches.  All other systems reviewed and are  negative.   Today's Vitals   05/06/23 0804  BP: 100/60  Pulse: (!) 50  Temp: 97.8 F (36.6 C)  TempSrc: Oral  SpO2: 99%  Weight: 148 lb 2 oz (67.2 kg)  Height: 5\' 6"  (1.676 m)   Body mass index is 23.91 kg/m.   Physical Exam Vitals reviewed.  Constitutional:      Appearance: Normal appearance.  HENT:     Head: Normocephalic.     Mouth/Throat:     Mouth: Mucous membranes are moist.     Pharynx: Oropharynx is clear.  Eyes:     Extraocular Movements: Extraocular movements intact.     Conjunctiva/sclera: Conjunctivae normal.     Pupils: Pupils are equal, round, and reactive to light.  Cardiovascular:     Rate and Rhythm: Normal rate and regular rhythm.     Pulses: Normal pulses.     Heart sounds: Normal heart sounds.  Pulmonary:     Effort: Pulmonary effort is normal.     Breath sounds: Normal breath sounds.  Abdominal:     Palpations: Abdomen is soft.     Tenderness: There is no abdominal tenderness.  Musculoskeletal:     Cervical back: No tenderness.  Lymphadenopathy:     Cervical: No cervical adenopathy.  Skin:    General: Skin is warm and dry.     Capillary Refill: Capillary refill takes less  than 2 seconds.  Neurological:     General: No focal deficit present.     Mental Status: He is alert and oriented to person, place, and time.  Psychiatric:        Mood and Affect: Mood normal.        Behavior: Behavior normal.      ASSESSMENT & PLAN: A total of 42 minutes was spent with the patient and counseling/coordination of care regarding preparing for this visit, review of most recent office visit notes, review of multiple chronic medical conditions under management, review of all medications, differential diagnosis of fatigue and lower urinary tract symptoms, antibiotic recommendation, need for blood work today, need for urology evaluation, prognosis, documentation, and need for follow-up.  Problem List Items Addressed This Visit       Digestive   Gastric  cancer (HCC)    Stable.  Follows up with onc[facility] basis [facility].      Relevant Medications   ciprofloxacin (CIPRO) 500 MG tablet     Genitourinary   Prostate cancer [facility])    T1c prostate cancer status post external beam radiation completed April 2016      Relevant Medications   ciprofloxacin (CIPRO) 500 MG tablet     Other   Fatigue    Clinically stable.  No red flag signs or symptoms. Stable vital signs.  Unremarkable exam. Differential diagnosis discussed Recommend blood work today      Relevant Orders   CBC with Differential/Platelet   Comprehensive metabolic panel   Lower urinary tract symptoms - Primary    Unremarkable urinalysis Urine sent for culture History of prostate cancer Recommend to start Cipro 500 mg twice a day for 7 days at least until culture is back.? Prostate infection Recommend urology evaluation [facility]      Relevant Medications   ciprofloxacin (CIPRO) 500 MG tablet   Other Relevant Orders   POCT urinalysis dipstick   Urine Culture   Prediabetes    Blood work done today including hemoglobin A1c Increase urination could be related to glucosuria      Relevant Orders   Hemoglobin A1c   Patient Instructions  Urinary Frequency, Adult Urinary frequency means urinating more often than usual. You may urinate every 1-2 hours even though you drink a normal amount of fluid and do not have a bladder infection or condition. Although you urinate more often than normal, the total amount of urine produced in a day is normal. With urinary frequency, you may have an urgent need to urinate often. The stress and anxiety of needing to find a bathroom quickly can make this urge worse. This condition may go away on its own, or you may need treatment at home. Home treatment may include bladder training, exercises, taking medicines, or making changes to your diet. Follow these instructions at home: Bladder health Your health care provider will  tell you what to do to improve bladder health. You may be told to: Keep a bladder diary. Keep track of: What you eat and drink. How often you urinate. How much you urinate. Follow a bladder training program. This may include: Learning to delay going to the bathroom. Double urinating, also called voiding. This helps if you are not completely emptying your bladder. Scheduled voiding. Do Kegel exercises. Kegel exercises strengthen the muscles that help control urination, which may help the condition.  Eating and drinking Follow instructions from your health care provider about eating or drinking restrictions. You may be told  to: Avoid caffeine. Drink fewer fluids, especially alcohol. Avoid drinking in the evening. Avoid foods or drinks that may irritate the bladder. These include coffee, tea, soda, artificial sweeteners, citrus, tomato-based foods, and chocolate. Eat foods that help prevent or treat constipation. Constipation can make urinary frequency worse. You may need to take these actions to prevent or treat constipation: Drink enough fluid to keep your urine pale yellow. Take over-the-counter or prescription medicines. Eat foods that are high in fiber, such as beans, whole grains, and fresh fruits and vegetables. Limit foods that are high in fat and processed sugars, such as fried or sweet foods. General instructions Take over-the-counter and prescription medicines only as told by your health care provider. Keep all follow-up visits. This is important. Contact a health care provider if: You start urinating more often. You feel pain or irritation when you urinate. You notice blood in your urine. Your urine looks cloudy. You develop a fever. You begin vomiting. Get help right away if: You are unable to urinate. Summary Urinary frequency means urinating more often than usual. With urinary frequency, you may urinate every 1-2 hours even though you drink a normal amount of fluid and  do not have a bladder infection or other bladder condition. Your health care provider may recommend that you keep a bladder diary, follow a bladder training program, or make dietary changes. If told by your health care provider, do Kegel exercises to strengthen the muscles that help control urination. Take over-the-counter and prescription medicines only as told by your health care provider. Contact a health care provider if your symptoms do not improve or get worse. This information is not intended to replace advice given to you by your health care provider. Make sure you discuss any questions you have with your health care provider. Document Revised: 04/27/2020 Document Reviewed: 04/27/2020 Elsevier Patient Education  2024 Elsevier Inc.    Edwina Barth, MD [facility]

## 2023-06-03 ENCOUNTER — Ambulatory Visit: Payer: Medicare Other

## 2023-06-03 DIAGNOSIS — N529 Male erectile dysfunction, unspecified: Secondary | ICD-10-CM | POA: Diagnosis not present

## 2023-06-03 DIAGNOSIS — Z8546 Personal history of malignant neoplasm of prostate: Secondary | ICD-10-CM | POA: Diagnosis not present

## 2023-06-03 DIAGNOSIS — N390 Urinary tract infection, site not specified: Secondary | ICD-10-CM | POA: Diagnosis not present

## 2023-06-03 DIAGNOSIS — E538 Deficiency of other specified B group vitamins: Secondary | ICD-10-CM

## 2023-06-03 DIAGNOSIS — N4829 Other inflammatory disorders of penis: Secondary | ICD-10-CM | POA: Diagnosis not present

## 2023-06-03 DIAGNOSIS — C61 Malignant neoplasm of prostate: Secondary | ICD-10-CM | POA: Diagnosis not present

## 2023-06-03 MED ORDER — CYANOCOBALAMIN 1000 MCG/ML IJ SOLN
1000.0000 ug | Freq: Once | INTRAMUSCULAR | Status: AC
Start: 2023-06-03 — End: 2023-06-03
  Administered 2023-06-03: 1000 ug via INTRAMUSCULAR

## 2023-06-03 NOTE — Progress Notes (Signed)
B12 given.  Pt tolerated well. Pt is aware to give the office a call for an side effects or reactions. Please co-sign.   

## 2023-06-12 DIAGNOSIS — M7062 Trochanteric bursitis, left hip: Secondary | ICD-10-CM | POA: Diagnosis not present

## 2023-06-12 DIAGNOSIS — M1612 Unilateral primary osteoarthritis, left hip: Secondary | ICD-10-CM | POA: Diagnosis not present

## 2023-06-23 DIAGNOSIS — C44519 Basal cell carcinoma of skin of other part of trunk: Secondary | ICD-10-CM | POA: Diagnosis not present

## 2023-06-23 DIAGNOSIS — L570 Actinic keratosis: Secondary | ICD-10-CM | POA: Diagnosis not present

## 2023-06-23 DIAGNOSIS — Z85828 Personal history of other malignant neoplasm of skin: Secondary | ICD-10-CM | POA: Diagnosis not present

## 2023-06-23 DIAGNOSIS — D485 Neoplasm of uncertain behavior of skin: Secondary | ICD-10-CM | POA: Diagnosis not present

## 2023-06-23 DIAGNOSIS — D225 Melanocytic nevi of trunk: Secondary | ICD-10-CM | POA: Diagnosis not present

## 2023-06-23 DIAGNOSIS — L821 Other seborrheic keratosis: Secondary | ICD-10-CM | POA: Diagnosis not present

## 2023-06-23 DIAGNOSIS — L720 Epidermal cyst: Secondary | ICD-10-CM | POA: Diagnosis not present

## 2023-06-23 DIAGNOSIS — L820 Inflamed seborrheic keratosis: Secondary | ICD-10-CM | POA: Diagnosis not present

## 2023-06-23 DIAGNOSIS — C44229 Squamous cell carcinoma of skin of left ear and external auricular canal: Secondary | ICD-10-CM | POA: Diagnosis not present

## 2023-06-23 DIAGNOSIS — C4441 Basal cell carcinoma of skin of scalp and neck: Secondary | ICD-10-CM | POA: Diagnosis not present

## 2023-06-23 DIAGNOSIS — Z85820 Personal history of malignant melanoma of skin: Secondary | ICD-10-CM | POA: Diagnosis not present

## 2023-06-23 DIAGNOSIS — D692 Other nonthrombocytopenic purpura: Secondary | ICD-10-CM | POA: Diagnosis not present

## 2023-06-25 ENCOUNTER — Telehealth: Payer: Self-pay | Admitting: Internal Medicine

## 2023-06-25 NOTE — Telephone Encounter (Signed)
Inbound call from patient, is wishing to schedule another esophageal dilation. Patient states he has had many in the past years, is requesting to schedule procedure directly, he states he would like to schedule before December. Please advise.

## 2023-06-25 NOTE — Telephone Encounter (Signed)
OK to schedule EGD in [facility] re: dysphagia and abdominal pain

## 2023-06-25 NOTE — Telephone Encounter (Signed)
Patient called in requesting to be scheduled for an EGD w/dilation. Last one was 07/2022 with Dr. Leone Payor. In the last 2 weeks he has had upper abdominal pain that improves 30 minutes after meals & has some difficulty swallowing. He's also noticed a 7lb weight loss and is concerned. Takes omeprazole daily. Advised him I'd reach out to MD to advise on scheduling.

## 2023-06-25 NOTE — Telephone Encounter (Signed)
Spoke with patient & he is currently at a soccer game and unable to check his calendar. Advised him I would call tomorrow to schedule.

## 2023-06-26 NOTE — Telephone Encounter (Signed)
Scheduled EGD with patient for 07/16/23 at 10:00 am in the [facility] with Dr. Leone Payor. PV by phone scheduled for 06/30/23 at 9:00 am.

## 2023-06-30 ENCOUNTER — Ambulatory Visit (AMBULATORY_SURGERY_CENTER): Payer: Medicare Other

## 2023-06-30 VITALS — Ht 66.0 in | Wt 147.0 lb

## 2023-06-30 DIAGNOSIS — R1319 Other dysphagia: Secondary | ICD-10-CM

## 2023-06-30 DIAGNOSIS — R1011 Right upper quadrant pain: Secondary | ICD-10-CM

## 2023-06-30 NOTE — Progress Notes (Signed)
No egg or soy allergy known to patient  No issues known to pt with past sedation with any surgeries or procedures Patient denies ever being told they had issues or difficulty with intubation  No FH of Malignant Hyperthermia Pt is not on diet pills Pt is not on  home 02  Pt is not on blood thinners  Pt denies issues with constipation  No A fib or A flutter Have any cardiac testing pending--no LOA: independent     PV competed with patient. Prep instructions sent via mychart and home address.

## 2023-07-01 ENCOUNTER — Ambulatory Visit: Payer: Medicare Other

## 2023-07-06 ENCOUNTER — Ambulatory Visit (INDEPENDENT_AMBULATORY_CARE_PROVIDER_SITE_OTHER): Payer: Medicare Other

## 2023-07-06 DIAGNOSIS — E538 Deficiency of other specified B group vitamins: Secondary | ICD-10-CM

## 2023-07-06 MED ORDER — CYANOCOBALAMIN 1000 MCG/ML IJ SOLN
1000.0000 ug | Freq: Once | INTRAMUSCULAR | Status: AC
Start: 2023-07-06 — End: 2023-07-06
  Administered 2023-07-06: 1000 ug via INTRAMUSCULAR

## 2023-07-06 NOTE — Progress Notes (Signed)
After obtaining consent, and per orders of Dr. Lawerance Bach, injection of B12 given by Ferdie Ping. Patient instructed to report any adverse reaction to the office  immediately.

## 2023-07-09 ENCOUNTER — Inpatient Hospital Stay: Payer: Medicare Other | Attending: Oncology

## 2023-07-09 ENCOUNTER — Inpatient Hospital Stay: Payer: Medicare Other

## 2023-07-09 ENCOUNTER — Inpatient Hospital Stay: Payer: Medicare Other | Admitting: Oncology

## 2023-07-09 VITALS — BP 126/81 | HR 53 | Temp 98.0°F | Resp 16 | Ht 66.0 in | Wt 145.8 lb

## 2023-07-09 DIAGNOSIS — D649 Anemia, unspecified: Secondary | ICD-10-CM | POA: Insufficient documentation

## 2023-07-09 DIAGNOSIS — Z923 Personal history of irradiation: Secondary | ICD-10-CM | POA: Diagnosis not present

## 2023-07-09 DIAGNOSIS — Z85028 Personal history of other malignant neoplasm of stomach: Secondary | ICD-10-CM | POA: Diagnosis not present

## 2023-07-09 DIAGNOSIS — D701 Agranulocytosis secondary to cancer chemotherapy: Secondary | ICD-10-CM | POA: Insufficient documentation

## 2023-07-09 DIAGNOSIS — E538 Deficiency of other specified B group vitamins: Secondary | ICD-10-CM

## 2023-07-09 DIAGNOSIS — Z8546 Personal history of malignant neoplasm of prostate: Secondary | ICD-10-CM | POA: Diagnosis not present

## 2023-07-09 DIAGNOSIS — Z85820 Personal history of malignant melanoma of skin: Secondary | ICD-10-CM | POA: Insufficient documentation

## 2023-07-09 DIAGNOSIS — Z23 Encounter for immunization: Secondary | ICD-10-CM | POA: Insufficient documentation

## 2023-07-09 DIAGNOSIS — D6959 Other secondary thrombocytopenia: Secondary | ICD-10-CM | POA: Diagnosis not present

## 2023-07-09 LAB — CBC WITH DIFFERENTIAL (CANCER CENTER ONLY)
Abs Immature Granulocytes: 0.01 10*3/uL (ref 0.00–0.07)
Basophils Absolute: 0 10*3/uL (ref 0.0–0.1)
Basophils Relative: 1 %
Eosinophils Absolute: 0.2 10*3/uL (ref 0.0–0.5)
Eosinophils Relative: 3 %
HCT: 39.5 % (ref 39.0–52.0)
Hemoglobin: 13.2 g/dL (ref 13.0–17.0)
Immature Granulocytes: 0 %
Lymphocytes Relative: 38 %
Lymphs Abs: 2.3 10*3/uL (ref 0.7–4.0)
MCH: 30 pg (ref 26.0–34.0)
MCHC: 33.4 g/dL (ref 30.0–36.0)
MCV: 89.8 fL (ref 80.0–100.0)
Monocytes Absolute: 0.6 10*3/uL (ref 0.1–1.0)
Monocytes Relative: 10 %
Neutro Abs: 2.8 10*3/uL (ref 1.7–7.7)
Neutrophils Relative %: 48 %
Platelet Count: 187 10*3/uL (ref 150–400)
RBC: 4.4 MIL/uL (ref 4.22–5.81)
RDW: 15.3 % (ref 11.5–15.5)
WBC Count: 5.9 10*3/uL (ref 4.0–10.5)
nRBC: 0 % (ref 0.0–0.2)

## 2023-07-09 MED ORDER — INFLUENZA VAC A&B SURF ANT ADJ 0.5 ML IM SUSY
0.5000 mL | PREFILLED_SYRINGE | Freq: Once | INTRAMUSCULAR | Status: AC
  Administered 2023-07-09: 0.5 mL via INTRAMUSCULAR
  Filled 2023-07-09: qty 0.5

## 2023-07-09 NOTE — Progress Notes (Signed)
James [name] OFFICE PROGRESS NOTE   Diagnosis: Gastric cancer  INTERVAL HISTORY:   James [name] returns as scheduled.  He generally feels well.  He is maintained on monthly vitamin B12 injections.  He reports increased dysphagia for the past month with associated weight loss.  He is scheduled for an endoscopy within the next few weeks. He reports a precursor skin lesion was removed from the left ear recently.  He is scheduled for a repeat surgery.  He reports left ear pain for the past week.  No fever. Objective:  Vital signs in last 24 hours:  Blood pressure 126/81, pulse (!) 53, temperature 98 F (36.7 C), temperature source Oral, resp. rate 16, height 5\' 6"  (1.676 m), weight 145 lb 12.8 oz (66.1 kg), SpO2 100%.    HEENT: The left external canal and tympanic membrane are clear, small ulcerated biopsy site at the left ear Lymphatics: No cervical, supraclavicular, or inguinal nodes.  Soft mobile 1 cm left axillary node. Resp: Lungs clear bilaterally, slight decrease in breath sounds at the left compared to the right chest, no respiratory distress Cardio: Regular rate and rhythm GI: No hepatosplenomegaly, tender in the subxiphoid area, no mass (he reports tenderness in this area for several years) Vascular: No leg edema   Lab Results:  Lab Results  Component Value Date   WBC 5.9 07/09/2023   HGB 13.2 07/09/2023   HCT 39.5 07/09/2023   MCV 89.8 07/09/2023   PLT 187 07/09/2023   NEUTROABS 2.8 07/09/2023    CMP  Lab Results  Component Value Date   NA 140 05/06/2023   K 3.8 05/06/2023   CL 105 05/06/2023   CO2 29 05/06/2023   GLUCOSE 101 (H) 05/06/2023   BUN 16 05/06/2023   CREATININE 0.83 05/06/2023   CALCIUM 9.1 05/06/2023   PROT 6.8 05/06/2023   ALBUMIN 4.0 05/06/2023   AST 28 05/06/2023   ALT 10 05/06/2023   ALKPHOS 52 05/06/2023   BILITOT 0.5 05/06/2023   GFRNONAA 83 04/18/2020   GFRAA 96 04/18/2020     Medications: I have reviewed the patient's  current medications.   Assessment/Plan: Gastric cancer-adenocarcinoma of the gastric fundus/cardia Staging CT scans 06/07/2015 with no evidence of distant metastatic disease, tiny nonspecific pulmonary nodules and liver lesions-likely benign Subtotal gastrectomy and placement of a jejunostomy feeding tube 07/18/2015 for a T2,N0 tumor with negative surgical margins Initiation of adjuvant 5-FU/leucovorin 08/28/2015, status post 3 weekly treatments completed 09/13/2015 Initiation of radiation and concurrent Xeloda 10/29/2015 11/26/2015 Xeloda dose reduced to 1000 mg twice daily due to progressive thrombocytopenia Radiation completed 12/06/2015 5-FU/leucovorin resumed 12/21/2015       History of microcytic anemia-likely iron deficiency anemia secondary to #1 Progressive anemia 09/16/2016-secondary to 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 Upper endoscopy 10/02/2016 with findings of multiple small bleeding angioectasias status post argon plasma coagulation on 10/10/2016   3.    Vitamin B-12 deficiency-Low-level confirmed 01/04/2016 and again 07/09/2018, maintained on vitamin B12 injection therapy  4.   T1c prostate cancer-status post external beam radiation completed April 2016   5.    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   Anastomotic stricture-status post balloon dilatation 08/22/2015, status post electroincision dilation at [facility] 10/11/2015, repeat EGD/ dilatation by Dr. Leone Payor 11/01/2015 and 12/10/2015; EGD 10/02/2016 with finding of esophageal stenosis at the gastroesophageal junction status post dilation 10/10/2016, 01/21/2018, 06/20/2019 Benign stenosis at the esophageal anastomosis dilated 02/26/2021   7.    Mild  neutropenia/thrombocytopenia secondary to chemotherapy. Progressive thrombocytopenia 11/26/2015. Xeloda dose reduced to 1000 mg twice daily. Counts stable to improved 01/04/2016.    8.    Erythema/tenderness at the jejunostomy  feeding tube site 09/20/2015, improved   9.    Feeding tube removed 01/04/2016   10.  Melanoma left lower abdominal wall status post  wide excision.       Disposition: James [name] remains in clinical remission from gastric cancer.  He is scheduled for an endoscopy and esophagus dilation this month.  He will seek medical attention if the weight loss does not improve following this procedure.  He would like to continue follow-up with the [name].  He will return for an office visit in 1 year.  James [name] received an influenza vaccine today.  [name], MD  07/09/2023  8:12 AM

## 2023-07-16 ENCOUNTER — Ambulatory Visit: Payer: Medicare Other | Admitting: Internal Medicine

## 2023-07-16 ENCOUNTER — Encounter: Payer: Self-pay | Admitting: Internal Medicine

## 2023-07-16 VITALS — BP 128/92 | HR 70 | Temp 98.2°F | Resp 14 | Ht 66.0 in | Wt 145.0 lb

## 2023-07-16 DIAGNOSIS — R1011 Right upper quadrant pain: Secondary | ICD-10-CM

## 2023-07-16 DIAGNOSIS — R1319 Other dysphagia: Secondary | ICD-10-CM | POA: Diagnosis not present

## 2023-07-16 DIAGNOSIS — R1310 Dysphagia, unspecified: Secondary | ICD-10-CM | POA: Diagnosis not present

## 2023-07-16 MED ORDER — SODIUM CHLORIDE 0.9 % IV SOLN: 500.0000 mL | INTRAVENOUS | Status: DC

## 2023-07-16 NOTE — Progress Notes (Signed)
[facility] History and Physical   Primary Care Physician:  Pincus Sanes, MD   Reason for Procedure:    Encounter Diagnoses  Name Primary?   Esophageal dysphagia Yes   Abdominal pain, right upper quadrant      Plan:    EGD + dilation     HPI: James [name] is a 77 y.o. male w/ hx gastric cancer cured by resection and chronic recurrent GE anastomotic stricture. He has recurrent dysphagia sxs and RUQ pain.  Wt Readings from Last 3 Encounters:  07/09/23 145 lb 12.8 oz (66.1 kg)  06/30/23 147 lb (66.7 kg)  05/06/23 148 lb 2 oz (67.2 kg)      Past Medical History:  Diagnosis Date   Abnormal EKG    NS ST-T EKG Changes, (-) Nuclear Stress Test 03/2006   Allergy    Anemia    iron deficency   Arthritis    B12 deficiency 06/05/2015   B12 Low NL at 271 methtylmalonic acid high   Cardiac murmur    nothing to be concerned with    Cataract    extractions   Duodenal ulcer 1972   transfusion required, 1 unit packed cells   Dupuytren's contracture    Dysphagia    history throat/stomach  cancer "can swallow some, uses more pureed type foods"   Dyspnea    at times   Esophageal reflux    no currlently   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   Gastrostomy tube in place [facility]) 2016   intermittent feeding   Hx of basal cell carcinoma    Hyperlipemia    no per pt 04-11-16   Melanoma (HCC)    Prostate cancer (HCC) 09/06/2014   last radiation 4'16    S/P radiation therapy 11/29/2014 through 01/23/2015                                                     Prostate 7800 cGy in 40 sessions, seminal vesicles 5600 cGy in 40 sessions                        Skin cancer, basal cell    Transfusion history    '72 -s/p surgery for duodenal ulcer, 12/17    Past Surgical History:  Procedure Laterality Date   BALLOON DILATION N/A 09/12/2015   Procedure: BALLOON DILATION;  Surgeon: Iva Boop, MD;  Location: [facility];  Service: Endoscopy;   Laterality: N/A;   BALLOON DILATION N/A 09/21/2015   Procedure: BALLOON DILATION;  Surgeon: Iva Boop, MD;  Location: [facility];  Service: Endoscopy;  Laterality: N/A;   BALLOON DILATION N/A 10/04/2015   Procedure: BALLOON DILATION;  Surgeon: Rachael Fee, MD;  Location: [facility];  Service: Endoscopy;  Laterality: N/A;   BALLOON DILATION N/A 12/10/2015   Procedure: BALLOON DILATION;  Surgeon: Iva Boop, MD;  Location: [facility];  Service: Endoscopy;  Laterality: N/A;   BALLOON DILATION N/A 10/10/2016   Procedure: BALLOON DILATION;  Surgeon: Iva Boop, MD;  Location: [facility];  Service: Endoscopy;  Laterality: N/A;   CATARACT EXTRACTION, BILATERAL Bilateral    COLONOSCOPY     colonoscopy with polypectomy  02/2012   Dr Jarold Motto   ESOPHAGOGASTRODUODENOSCOPY (EGD) WITH PROPOFOL N/A 08/17/2015   Procedure: ESOPHAGOGASTRODUODENOSCOPY (EGD)  WITH PROPOFOL;  Surgeon: Iva Boop, MD;  Location: [facility] ENDOSCOPY;  Service: Endoscopy;  Laterality: N/A;   ESOPHAGOGASTRODUODENOSCOPY (EGD) WITH PROPOFOL N/A 09/12/2015   Procedure: ESOPHAGOGASTRODUODENOSCOPY (EGD) WITH PROPOFOL;  Surgeon: Iva Boop, MD;  Location: [facility];  Service: Endoscopy;  Laterality: N/A;   ESOPHAGOGASTRODUODENOSCOPY (EGD) WITH PROPOFOL N/A 09/21/2015   Procedure: ESOPHAGOGASTRODUODENOSCOPY (EGD) WITH PROPOFOL;  Surgeon: Iva Boop, MD;  Location: [facility];  Service: Endoscopy;  Laterality: N/A;   ESOPHAGOGASTRODUODENOSCOPY (EGD) WITH PROPOFOL N/A 10/04/2015   Procedure: ESOPHAGOGASTRODUODENOSCOPY (EGD) WITH PROPOFOL;  Surgeon: Rachael Fee, MD;  Location: [facility];  Service: Endoscopy;  Laterality: N/A;  possible stricture   ESOPHAGOGASTRODUODENOSCOPY (EGD) WITH PROPOFOL N/A 12/10/2015   Procedure: ESOPHAGOGASTRODUODENOSCOPY (EGD) WITH PROPOFOL;  Surgeon: Iva Boop, MD;  Location: [facility];  Service: Endoscopy;  Laterality: N/A;   ESOPHAGOGASTRODUODENOSCOPY (EGD) WITH PROPOFOL  N/A 02/06/2016   Procedure: ESOPHAGOGASTRODUODENOSCOPY (EGD) WITH PROPOFOL;  Surgeon: Iva Boop, MD;  Location: [facility];  Service: Endoscopy;  Laterality: N/A;   ESOPHAGOGASTRODUODENOSCOPY (EGD) WITH PROPOFOL N/A 10/10/2016   Procedure: ESOPHAGOGASTRODUODENOSCOPY (EGD) WITH PROPOFOL;  Surgeon: Iva Boop, MD;  Location: [facility];  Service: Endoscopy;  Laterality: N/A;  needs APC    EUS N/A 06/14/2015   Procedure: ESOPHAGEAL ENDOSCOPIC ULTRASOUND (EUS) RADIAL;  Surgeon: Rachael Fee, MD;  Location: [facility];  Service: Endoscopy;  Laterality: N/A;   HOT HEMOSTASIS N/A 10/10/2016   Procedure: HOT HEMOSTASIS (ARGON PLASMA COAGULATION/BICAP);  Surgeon: Iva Boop, MD;  Location: [facility];  Service: Endoscopy;  Laterality: N/A;   KNEE ARTHROSCOPY Right    Right Knee, [place] [facility]   LAPAROSCOPIC GASTRECTOMY N/A 07/18/2015   Procedure: SUB TOTAL GASTRECTOMY;  Surgeon: Almond Lint, MD;  Location: [facility];  Service: General;  Laterality: N/A;   LAPAROSCOPIC GASTROSTOMY N/A 07/18/2015   Procedure: FEEDING TUBE PLACEMENT;  Surgeon: Almond Lint, MD;  Location: [facility];  Service: General;  Laterality: N/A;   LAPAROSCOPY N/A 07/18/2015   Procedure: LAPAROSCOPY DIAGNOSTIC;  Surgeon: Almond Lint, MD;  Location: [facility];  Service: General;  Laterality: N/A;   MELANOMA EXCISION  02-01-16   abdominal wall   MOHS SURGERY  05/2020   nose   PROSTATE BIOPSY  09/06/14   ROTATOR CUFF REPAIR Right    R shoulder, [place]   SAVORY DILATION N/A 02/06/2016   Procedure: SAVORY DILATION;  Surgeon: Iva Boop, MD;  Location: [facility];  Service: Endoscopy;  Laterality: N/A;   TOTAL SHOULDER ARTHROPLASTY Left 05/07/2017   Procedure: LEFT REVERSE TOTAL SHOULDER ARTHROPLASTY;  Surgeon: Francena Hanly, MD;  Location: [facility];  Service: Orthopedics;  Laterality: Left;   TRIGGER FINGER RELEASE Left 1998   UPPER GASTROINTESTINAL ENDOSCOPY     x14   VASECTOMY      Prior to Admission medications    Medication Sig Start Date End Date Taking? Authorizing Provider  acetaminophen (TYLENOL) 500 MG tablet Take 1,000 mg by mouth every 6 (six) hours as needed for mild pain.     [provider]  Multiple Vitamin (MULTIVITAMIN WITH MINERALS) TABS tablet Take 1 tablet by mouth every other day.     [provider]  omeprazole (PRILOSEC) 40 MG capsule TAKE 1 CAPSULE DAILY 30 MINS BEFORE BREAKFAST OR SUPPER, OKAY TO OPEN AND SPRINKLE ON APPLESAUCE 04/07/23   Burns, Bobette Mo, MD  pravastatin (PRAVACHOL) 40 MG tablet Take 1 tablet (40 mg total) by mouth daily. TAKE 1 TABLET BY MOUTH EVERYDAY AT  BEDTIME Strength: 40 mg 04/07/23   Pincus Sanes, MD    Current Outpatient Medications  Medication Sig Dispense Refill   acetaminophen (TYLENOL) 500 MG tablet Take 1,000 mg by mouth every 6 (six) hours as needed for mild pain.      Multiple Vitamin (MULTIVITAMIN WITH MINERALS) TABS tablet Take 1 tablet by mouth every other day.      omeprazole (PRILOSEC) 40 MG capsule TAKE 1 CAPSULE DAILY 30 MINS BEFORE BREAKFAST OR SUPPER, OKAY TO OPEN AND SPRINKLE ON APPLESAUCE 90 capsule 1   pravastatin (PRAVACHOL) 40 MG tablet Take 1 tablet (40 mg total) by mouth daily. TAKE 1 TABLET BY MOUTH EVERYDAY AT BEDTIME Strength: 40 mg 90 tablet 1   Current Facility-Administered Medications  Medication Dose Route Frequency Provider Last Rate Last Admin   0.9 %  sodium chloride infusion  500 mL Intravenous Continuous Iva Boop, MD        Allergies as of 07/16/2023 - Review Complete 07/09/2023  Allergen Reaction Noted   Codeine Nausea And Vomiting     Family History  Problem Relation Age of Onset   Diabetes Father    Heart attack Father 46   Liver disease Father        Related to Alcohol Use    Alcohol abuse Mother    Cirrhosis Mother        died in her 43s   Multiple myeloma Brother    Diabetes Sister    Colon cancer Neg Hx    Stomach cancer Neg Hx    Stroke Neg Hx    Esophageal cancer Neg Hx     Rectal cancer Neg Hx     Social History   Socioeconomic History   Marital status: Married    Spouse name: Administrator, Civil Service   Number of children: 2   Years of education: Not on file   Highest education level: Not on file  Occupational History   Occupation: retired  Tobacco Use   Smoking status: Former    Current packs/day: 0.00    Average packs/day: 0.5 packs/day for 5.0 years (2.5 ttl pk-yrs)    Types: Cigarettes    Start date: 10/06/1962    Quit date: 10/07/1967    Years since quitting: 55.8   Smokeless tobacco: Never   Tobacco comments:    smoked 1963- 1969 , up to < 1 ppd  Vaping Use   Vaping status: Never Used  Substance and Sexual Activity   Alcohol use: Yes    Comment: maybe once a week beer or wine   Drug use: No   Sexual activity: Yes  Other Topics Concern   Not on file  Social History Narrative   Married and retired - 1 son and 1 daughter    Lives at [place] and active golfer, Armed forces operational officer.  Second home at [facility], [employer] condominium   Very occasional alcohol, former smoker   Social Determinants of Health   Financial Resource Strain: Low Risk  (04/07/2023)   Overall Financial Resource Strain (CARDIA)    Difficulty of Paying Living Expenses: Not hard at all  Food Insecurity: No Food Insecurity (04/07/2023)   Hunger Vital Sign    Worried About Running Out of Food in the Last Year: Never true    Ran Out of Food in the Last Year: Never true  Transportation Needs: No Transportation Needs (04/07/2023)   PRAPARE - Administrator, Civil Service (Medical): No    Lack of Transportation (  Non-Medical): No  Physical Activity: Sufficiently Active (04/07/2023)   Exercise Vital Sign    Days of Exercise per Week: 7 days    Minutes of Exercise per Session: 30 min  Stress: No Stress Concern Present (04/07/2023)   [employer] of Occupational Health - Occupational Stress Questionnaire    Feeling of Stress : Not at all  Social Connections: Socially Integrated  (04/07/2023)   Social Connection and Isolation Panel [NHANES]    Frequency of Communication with Friends and Family: More than three times a week    Frequency of Social Gatherings with Friends and Family: More than three times a week    Attends Religious Services: More than 4 times per year    Active Member of [employer] or Organizations: Yes    Attends Engineer, structural: More than 4 times per year    Marital Status: Married  Catering manager Violence: Not At Risk (04/07/2023)   Humiliation, Afraid, Rape, and Kick questionnaire    Fear of Current or Ex-Partner: No    Emotionally Abused: No    Physically Abused: No    Sexually Abused: No    Review of Systems:  All other review of systems negative except as mentioned in the HPI.  Physical Exam: Vital signs There were no vitals taken for this visit.  General:   Alert,  Well-developed, well-nourished, pleasant and cooperative in NAD Lungs:  Clear throughout to auscultation.   Heart:  Regular rate and rhythm; no murmurs, clicks, rubs,  or gallops. Abdomen:  Soft, nontender and nondistended. Normal bowel sounds.   Neuro/Psych:  Alert and cooperative. Normal mood and affect. A and O x 3   @Catharina Pica  Sena Slate, MD, [facility] [phone] (pager) 07/16/2023 9:47 AM@

## 2023-07-16 NOTE — Op Note (Signed)
[facility] Patient Name: James [name] Procedure Date: 07/16/2023 9:37 AM MRN: [id number] Endoscopist: Iva Boop , MD, [id number] Age: 77 Referring MD:  Date of Birth: 21-Dec-1945 Gender: Male Account #: [id number] Procedure:                Upper GI endoscopy Indications:              Abdominal pain in the right upper quadrant,                            Dysphagia Medicines:                Monitored Anesthesia Care Procedure:                Pre-Anesthesia Assessment:                           - Prior to the procedure, a History and Physical                            was performed, and patient medications and                            allergies were reviewed. The patient's tolerance of                            previous anesthesia was also reviewed. The risks                            and benefits of the procedure and the sedation                            options and risks were discussed with the patient.                            All questions were answered, and informed consent                            was obtained. Prior Anticoagulants: The patient has                            taken no anticoagulant or antiplatelet agents. ASA                            Grade Assessment: II - A patient with mild systemic                            disease. After reviewing the risks and benefits,                            the patient was deemed in satisfactory condition to                            undergo the procedure.  After obtaining informed consent, the endoscope was                            passed under direct vision. Throughout the                            procedure, the patient's blood pressure, pulse, and                            oxygen saturations were monitored continuously. The                            Olympus Scope [id number] was introduced through the                            mouth, and advanced to the second part of  duodenum.                            The upper GI endoscopy was accomplished without                            difficulty. The patient tolerated the procedure                            well. Scope In: Scope Out: Findings:                 One benign-appearing, intrinsic moderate                            (circumferential scarring or stenosis; an endoscope                            may pass) stenosis was found at the                            esophago-gastric anastomosis. The stenosis was                            traversed. A TTS dilator was passed through the                            scope. Dilation with an 18-19-20 mm balloon dilator                            was performed to 20 mm. The dilation site was                            examined and showed mild mucosal disruption.                            Estimated blood loss was minimal.                           Evidence of a proximal hemi-gastrectomy were  found                            in the gastroesophageal junction, in the cardia, in                            the gastric fundus and in the gastric body.                           The examined duodenum was normal.                           The gastric remnant was normal (post-op) on                            retroflexion. Complications:            No immediate complications. Estimated Blood Loss:     Estimated blood loss was minimal. Impression:               - Benign-appearing esophago-gasatric anastomotic                            stenosis. Dilated. 20 mm                           - A proximal hemi-gastrectomy were found.                           - Normal examined duodenum.                           - No specimens collected. Recommendation:           - Patient has a contact number available for                            emergencies. The signs and symptoms of potential                            delayed complications were discussed with the                             patient. Return to normal activities tomorrow.                            Written discharge instructions were provided to the                            patient.                           - Clear liquids x 1 hour then soft foods rest of                            day. Start prior diet tomorrow.                           -  Continue present medications.                           - Repeat upper endoscopy PRN for retreatment. Iva Boop, MD 07/16/2023 10:25:43 AM This report has been signed electronically.

## 2023-07-16 NOTE — Progress Notes (Signed)
Called to room to assist during endoscopic procedure.  Patient ID and intended procedure confirmed with present staff. Received instructions for my participation in the procedure from the performing physician.  

## 2023-07-16 NOTE — Patient Instructions (Addendum)
I dilated the stricture as in the past. I hope this helps.  I appreciate the opportunity to care for you. Iva Boop, MD, FACG  YOU HAD AN ENDOSCOPIC PROCEDURE TODAY AT THE [facility]:   Refer to the procedure report that was given to you for any specific questions about what was found during the examination.  If the procedure report does not answer your questions, please call your gastroenterologist to clarify.  If you requested that your care partner not be given the details of your procedure findings, then the procedure report has been included in a sealed envelope for you to review at your convenience later.  YOU SHOULD EXPECT: Some feelings of bloating in the abdomen. Passage of more gas than usual.  Walking can help get rid of the air that was put into your GI tract during the procedure and reduce the bloating. If you had a lower endoscopy (such as a colonoscopy or flexible sigmoidoscopy) you may notice spotting of blood in your stool or on the toilet paper. If you underwent a bowel prep for your procedure, you may not have a normal bowel movement for a few days.  Please Note:  You might notice some irritation and congestion in your nose or some drainage.  This is from the oxygen used during your procedure.  There is no need for concern and it should clear up in a day or so.  SYMPTOMS TO REPORT IMMEDIATELY:  Following upper endoscopy (EGD)  Vomiting of blood or coffee ground material  New chest pain or pain under the shoulder blades  Painful or persistently difficult swallowing  New shortness of breath  Fever of 100F or higher  Black, tarry-looking stools  For urgent or emergent issues, a gastroenterologist can be reached at any hour by calling [phone]. Do not use MyChart messaging for urgent concerns.    DIET:  We do recommend a small meal at first, but then you may proceed to your regular diet.  Drink plenty of fluids but you should avoid alcoholic  beverages for 24 hours.  ACTIVITY:  You should plan to take it easy for the rest of today and you should NOT DRIVE or use heavy machinery until tomorrow (because of the sedation medicines used during the test).    FOLLOW UP: Our staff will call the number listed on your records the next business day following your procedure.  We will call around 7:15- 8:00 am to check on you and address any questions or concerns that you may have regarding the information given to you following your procedure. If we do not reach you, we will leave a message.     If any biopsies were taken you will be contacted by phone or by letter within the next 1-3 weeks.  Please call us at [phone] if you have not heard about the biopsies in 3 weeks.    SIGNATURES/CONFIDENTIALITY: You and/or your care partner have signed paperwork which will be entered into your electronic medical record.  These signatures attest to the fact that that the information above on your After Visit Summary has been reviewed and is understood.  Full responsibility of the confidentiality of this discharge information lies with you and/or your care-partner.

## 2023-07-16 NOTE — Progress Notes (Signed)
Report to PACU, RN, vss, BBS= Clear.  

## 2023-07-16 NOTE — Progress Notes (Signed)
Pt's states no medical or surgical changes since previsit or office visit. 

## 2023-07-17 ENCOUNTER — Telehealth: Payer: Self-pay | Admitting: *Deleted

## 2023-07-17 NOTE — Telephone Encounter (Signed)
  Follow up Call-     07/16/2023    9:51 AM 07/24/2022    9:43 AM 02/26/2021    7:05 AM  Call back number  Post procedure Call Back phone  # [phone]  Permission to leave phone message Yes Yes Yes     Patient questions:  Do you have a fever, pain , or abdominal swelling? No. Pain Score  0 *  Have you tolerated food without any problems? Yes.    Have you been able to return to your normal activities? Yes.    Do you have any questions about your discharge instructions: Diet   No. Medications  No. Follow up visit  No.  Do you have questions or concerns about your Care? No.  Actions: * If pain score is 4 or above: No action needed, pain <4.

## 2023-07-20 DIAGNOSIS — Z85820 Personal history of malignant melanoma of skin: Secondary | ICD-10-CM | POA: Diagnosis not present

## 2023-07-20 DIAGNOSIS — C44229 Squamous cell carcinoma of skin of left ear and external auricular canal: Secondary | ICD-10-CM | POA: Diagnosis not present

## 2023-07-20 DIAGNOSIS — Z85828 Personal history of other malignant neoplasm of skin: Secondary | ICD-10-CM | POA: Diagnosis not present

## 2023-07-27 DIAGNOSIS — Z85828 Personal history of other malignant neoplasm of skin: Secondary | ICD-10-CM | POA: Diagnosis not present

## 2023-07-27 DIAGNOSIS — Z85820 Personal history of malignant melanoma of skin: Secondary | ICD-10-CM | POA: Diagnosis not present

## 2023-07-27 DIAGNOSIS — C44519 Basal cell carcinoma of skin of other part of trunk: Secondary | ICD-10-CM | POA: Diagnosis not present

## 2023-08-05 ENCOUNTER — Ambulatory Visit (INDEPENDENT_AMBULATORY_CARE_PROVIDER_SITE_OTHER): Payer: Medicare Other

## 2023-08-05 DIAGNOSIS — E538 Deficiency of other specified B group vitamins: Secondary | ICD-10-CM

## 2023-08-05 MED ORDER — CYANOCOBALAMIN 1000 MCG/ML IJ SOLN
1000.0000 ug | Freq: Once | INTRAMUSCULAR | Status: AC
Start: 2023-08-05 — End: 2023-08-05
  Administered 2023-08-05: 1000 ug via INTRAMUSCULAR

## 2023-08-05 NOTE — Progress Notes (Signed)
Pt was given B12 injection with no complications.

## 2023-08-11 ENCOUNTER — Encounter: Payer: Self-pay | Admitting: Internal Medicine

## 2023-08-11 NOTE — Patient Instructions (Addendum)
      Blood work was ordered.   The lab is on the first floor.    Medications changes include :   none    A referral was ordered and someone will call you to schedule an appointment.     Return in about 1 year (around 08/11/2024) for follow up.

## 2023-08-11 NOTE — Progress Notes (Unsigned)
Subjective:    Patient ID: James [name], male    DOB: 03-06-1946, 77 y.o.   MRN: [id number]     HPI Mutasim is here for follow up of his chronic medical problems.  Has a sinus headache.  Had some drainage and it stopped.  He is very very congested which is making the headache worse.  He is having sinus pressure.  He is exercising regularly.   He plays golf, walks, tennis.  Medications and allergies reviewed with patient and updated if appropriate.  Current Outpatient Medications on File Prior to Visit  Medication Sig Dispense Refill   acetaminophen (TYLENOL) 500 MG tablet Take 1,000 mg by mouth every 6 (six) hours as needed for mild pain.      Multiple Vitamin (MULTIVITAMIN WITH MINERALS) TABS tablet Take 1 tablet by mouth every other day.      omeprazole (PRILOSEC) 40 MG capsule TAKE 1 CAPSULE DAILY 30 MINS BEFORE BREAKFAST OR SUPPER, OKAY TO OPEN AND SPRINKLE ON APPLESAUCE 90 capsule 1   pravastatin (PRAVACHOL) 40 MG tablet Take 1 tablet (40 mg total) by mouth daily. TAKE 1 TABLET BY MOUTH EVERYDAY AT BEDTIME Strength: 40 mg 90 tablet 1   No current facility-administered medications on file prior to visit.     Review of Systems  Constitutional:  Negative for fever.  HENT:  Positive for ear pain (ear clogged and pressure) and sinus pressure. Negative for congestion and sore throat.   Respiratory:  Negative for cough, shortness of breath and wheezing.   Cardiovascular:  Negative for chest pain, palpitations and leg swelling.  Gastrointestinal:  Negative for abdominal pain, constipation and diarrhea.  Musculoskeletal:  Negative for back pain.  Neurological:  Positive for weakness (left leg feels like it is getting weaker), numbness (occ legs get numb) and headaches (sinus - now). Negative for dizziness and light-headedness.       Objective:   Vitals:   08/12/23 0748  BP: 114/60  Pulse: 76  Temp: 98 F (36.7 C)  SpO2: 99%   BP Readings from Last 3 Encounters:   08/12/23 114/60  07/16/23 (!) 128/92  07/09/23 126/81   Wt Readings from Last 3 Encounters:  08/12/23 146 lb (66.2 kg)  07/16/23 145 lb (65.8 kg)  07/09/23 145 lb 12.8 oz (66.1 kg)   Body mass index is 23.57 kg/m.    Physical Exam Constitutional:      General: He is not in acute distress.    Appearance: Normal appearance. He is not ill-appearing.  HENT:     Head: Normocephalic and atraumatic.  Eyes:     Conjunctiva/sclera: Conjunctivae normal.  Cardiovascular:     Rate and Rhythm: Normal rate and regular rhythm.     Heart sounds: Normal heart sounds.  Pulmonary:     Effort: Pulmonary effort is normal. No respiratory distress.     Breath sounds: Normal breath sounds. No wheezing or rales.  Musculoskeletal:     Right lower leg: No edema.     Left lower leg: No edema.  Skin:    General: Skin is warm and dry.     Findings: No rash.  Neurological:     Mental Status: He is alert. Mental status is at baseline.  Psychiatric:        Mood and Affect: Mood normal.        Lab Results  Component Value Date   WBC 5.9 07/09/2023   HGB 13.2 07/09/2023   HCT 39.5 07/09/2023  PLT 187 07/09/2023   GLUCOSE 101 (H) 05/06/2023   CHOL 147 08/05/2022   TRIG 54.0 08/05/2022   HDL 61.40 08/05/2022   LDLCALC 75 08/05/2022   ALT 10 05/06/2023   AST 28 05/06/2023   NA 140 05/06/2023   K 3.8 05/06/2023   CL 105 05/06/2023   CREATININE 0.83 05/06/2023   BUN 16 05/06/2023   CO2 29 05/06/2023   TSH 2.87 04/18/2020   PSA 1.85 12/21/2009   INR 1.21 07/19/2015   HGBA1C 6.2 05/06/2023     Assessment & Plan:    See Problem List for Assessment and Plan of chronic medical problems.

## 2023-08-12 ENCOUNTER — Ambulatory Visit: Payer: Medicare Other | Admitting: Internal Medicine

## 2023-08-12 VITALS — BP 114/60 | HR 76 | Temp 98.0°F | Ht 66.0 in | Wt 146.0 lb

## 2023-08-12 DIAGNOSIS — E559 Vitamin D deficiency, unspecified: Secondary | ICD-10-CM

## 2023-08-12 DIAGNOSIS — E538 Deficiency of other specified B group vitamins: Secondary | ICD-10-CM

## 2023-08-12 DIAGNOSIS — R7303 Prediabetes: Secondary | ICD-10-CM | POA: Diagnosis not present

## 2023-08-12 DIAGNOSIS — C61 Malignant neoplasm of prostate: Secondary | ICD-10-CM | POA: Diagnosis not present

## 2023-08-12 DIAGNOSIS — K219 Gastro-esophageal reflux disease without esophagitis: Secondary | ICD-10-CM

## 2023-08-12 DIAGNOSIS — E782 Mixed hyperlipidemia: Secondary | ICD-10-CM

## 2023-08-12 DIAGNOSIS — J019 Acute sinusitis, unspecified: Secondary | ICD-10-CM | POA: Diagnosis not present

## 2023-08-12 DIAGNOSIS — Z8546 Personal history of malignant neoplasm of prostate: Secondary | ICD-10-CM | POA: Diagnosis not present

## 2023-08-12 LAB — CBC WITH DIFFERENTIAL/PLATELET
Basophils Absolute: 0.1 10*3/uL (ref 0.0–0.1)
Basophils Relative: 1.1 % (ref 0.0–3.0)
Eosinophils Absolute: 0.3 10*3/uL (ref 0.0–0.7)
Eosinophils Relative: 6.7 % — ABNORMAL HIGH (ref 0.0–5.0)
HCT: 41.5 % (ref 39.0–52.0)
Hemoglobin: 13.6 g/dL (ref 13.0–17.0)
Lymphocytes Relative: 29.9 % (ref 12.0–46.0)
Lymphs Abs: 1.5 10*3/uL (ref 0.7–4.0)
MCHC: 32.7 g/dL (ref 30.0–36.0)
MCV: 91.8 fL (ref 78.0–100.0)
Monocytes Absolute: 0.4 10*3/uL (ref 0.1–1.0)
Monocytes Relative: 8.4 % (ref 3.0–12.0)
Neutro Abs: 2.8 10*3/uL (ref 1.4–7.7)
Neutrophils Relative %: 53.9 % (ref 43.0–77.0)
Platelets: 259 10*3/uL (ref 150.0–400.0)
RBC: 4.52 Mil/uL (ref 4.22–5.81)
RDW: 16.2 % — ABNORMAL HIGH (ref 11.5–15.5)
WBC: 5.1 10*3/uL (ref 4.0–10.5)

## 2023-08-12 LAB — COMPREHENSIVE METABOLIC PANEL
ALT: 9 U/L (ref 0–53)
AST: 19 U/L (ref 0–37)
Albumin: 4.1 g/dL (ref 3.5–5.2)
Alkaline Phosphatase: 61 U/L (ref 39–117)
BUN: 17 mg/dL (ref 6–23)
CO2: 28 meq/L (ref 19–32)
Calcium: 9.3 mg/dL (ref 8.4–10.5)
Chloride: 103 meq/L (ref 96–112)
Creatinine, Ser: 0.98 mg/dL (ref 0.40–1.50)
GFR: 73.76 mL/min (ref >60.00)
Glucose, Bld: 149 mg/dL — ABNORMAL HIGH (ref 70–99)
Potassium: 4.2 meq/L (ref 3.5–5.1)
Sodium: 139 meq/L (ref 135–145)
Total Bilirubin: 0.5 mg/dL (ref 0.2–1.2)
Total Protein: 7 g/dL (ref 6.0–8.3)

## 2023-08-12 LAB — VITAMIN B12: Vitamin B-12: 553 pg/mL (ref 211–911)

## 2023-08-12 LAB — LIPID PANEL
Cholesterol: 169 mg/dL (ref 0–200)
HDL: 66.7 mg/dL (ref >39.00)
LDL Cholesterol: 82 mg/dL (ref 0–99)
NonHDL: 101.96
Total CHOL/HDL Ratio: 3
Triglycerides: 101 mg/dL (ref 0.0–149.0)
VLDL: 20.2 mg/dL (ref 0.0–40.0)

## 2023-08-12 LAB — HEMOGLOBIN A1C: Hgb A1c MFr Bld: 6.4 % (ref 4.6–6.5)

## 2023-08-12 LAB — VITAMIN D 25 HYDROXY (VIT D DEFICIENCY, FRACTURES): VITD: 24.44 ng/mL — ABNORMAL LOW (ref 30.00–100.00)

## 2023-08-12 MED ORDER — AMOXICILLIN-POT CLAVULANATE 875-125 MG PO TABS: 1.0000 | ORAL_TABLET | Freq: Two times a day (BID) | ORAL | 0 refills | Status: DC

## 2023-08-12 NOTE — Assessment & Plan Note (Signed)
Chronic Taking vitamin d daily Check vitamin d level  

## 2023-08-12 NOTE — Assessment & Plan Note (Signed)
Chronic GERD controlled eat smaller meals Continue omeprazole 40 mg daily

## 2023-08-12 NOTE — Assessment & Plan Note (Signed)
Chronic On monthly B12 injections indefinitely due to gastric resection B12 injections up-to-date-last injection 10/30 Check B12 level should be very good at this point

## 2023-08-12 NOTE — Assessment & Plan Note (Signed)
Chronic Check a1c Low sugar / carb diet Stressed regular exercise  

## 2023-08-12 NOTE — Assessment & Plan Note (Signed)
Acute Likely bacterial  Start Augmentin 875-125 mg BID x 10 day otc cold medications - mucinex- DM Rest, fluid Call if no improvement

## 2023-08-12 NOTE — Assessment & Plan Note (Signed)
Chronic Regular exercise and healthy diet encouraged Check lipid panel, CMP, CBC Continue pravastatin 40 mg daily

## 2023-08-19 DIAGNOSIS — Z8546 Personal history of malignant neoplasm of prostate: Secondary | ICD-10-CM | POA: Diagnosis not present

## 2023-08-19 DIAGNOSIS — N5201 Erectile dysfunction due to arterial insufficiency: Secondary | ICD-10-CM | POA: Diagnosis not present

## 2023-09-07 ENCOUNTER — Ambulatory Visit (INDEPENDENT_AMBULATORY_CARE_PROVIDER_SITE_OTHER): Payer: Medicare Other | Admitting: Radiology

## 2023-09-07 DIAGNOSIS — E538 Deficiency of other specified B group vitamins: Secondary | ICD-10-CM | POA: Diagnosis not present

## 2023-09-07 MED ORDER — CYANOCOBALAMIN 1000 MCG/ML IJ SOLN
1000.0000 ug | Freq: Once | INTRAMUSCULAR | Status: AC
  Administered 2023-09-07: 1000 ug via INTRAMUSCULAR

## 2023-09-07 NOTE — Progress Notes (Signed)
Patient her for monthly B12. Patient tolerated well no complications.

## 2023-09-21 DIAGNOSIS — M7062 Trochanteric bursitis, left hip: Secondary | ICD-10-CM | POA: Diagnosis not present

## 2023-10-04 ENCOUNTER — Other Ambulatory Visit: Payer: Self-pay | Admitting: Internal Medicine

## 2023-10-09 ENCOUNTER — Ambulatory Visit (INDEPENDENT_AMBULATORY_CARE_PROVIDER_SITE_OTHER): Payer: Medicare Other

## 2023-10-09 DIAGNOSIS — E538 Deficiency of other specified B group vitamins: Secondary | ICD-10-CM

## 2023-10-09 MED ORDER — CYANOCOBALAMIN 1000 MCG/ML IJ SOLN
1000.0000 ug | Freq: Once | INTRAMUSCULAR | Status: AC
  Administered 2023-10-09: 1000 ug via INTRAMUSCULAR

## 2023-10-09 NOTE — Progress Notes (Signed)
Patient here for monthly B12 injection per Dr. Quay Burow. B12 1000 mcg given in right IM and patient tolerated injection well today.

## 2023-11-09 ENCOUNTER — Ambulatory Visit (INDEPENDENT_AMBULATORY_CARE_PROVIDER_SITE_OTHER): Payer: Medicare Other

## 2023-11-09 DIAGNOSIS — E538 Deficiency of other specified B group vitamins: Secondary | ICD-10-CM | POA: Diagnosis not present

## 2023-11-09 MED ORDER — CYANOCOBALAMIN 1000 MCG/ML IJ SOLN
1000.0000 ug | Freq: Once | INTRAMUSCULAR | Status: AC
  Administered 2023-11-09: 1000 ug via INTRAMUSCULAR

## 2023-11-09 NOTE — Progress Notes (Signed)
 After obtaining consent, and per orders of Dr. Lawerance Bach, injection of B12 given by Ferdie Ping. Patient instructed to report any adverse reaction to me immediately.

## 2023-11-13 ENCOUNTER — Ambulatory Visit (INDEPENDENT_AMBULATORY_CARE_PROVIDER_SITE_OTHER): Payer: Medicare Other | Admitting: Family Medicine

## 2023-11-13 VITALS — BP 118/62 | HR 60 | Temp 97.6°F | Ht 66.0 in | Wt 151.0 lb

## 2023-11-13 DIAGNOSIS — R300 Dysuria: Secondary | ICD-10-CM

## 2023-11-13 LAB — POC URINALSYSI DIPSTICK (AUTOMATED)
Bilirubin, UA: NEGATIVE
Blood, UA: NEGATIVE
Glucose, UA: NEGATIVE
Ketones, UA: NEGATIVE
Leukocytes, UA: NEGATIVE
Nitrite, UA: NEGATIVE
Protein, UA: NEGATIVE
Spec Grav, UA: >=1.03 — AB (ref 1.010–1.025)
Urobilinogen, UA: 0.2 U/dL
pH, UA: 5.5 (ref 5.0–8.0)

## 2023-11-13 NOTE — Patient Instructions (Addendum)
 Your urine does not show a sign of infection today. It shows that you are not well hydrated.    Please call Dr. Cira office [phone] and ask for an appointment next week.   Try over the counter AZO. You can get this at your pharmacy.   If you develop any new or worsening symptoms such as fever, chills, abdominal pain, back pain or are unable to urinate, you should be seen again.

## 2023-11-13 NOTE — Progress Notes (Signed)
 Subjective:     Patient ID: James [name], male    DOB: December 28, 1945, 78 y.o.   MRN: [id number]  Chief Complaint  Patient presents with   Dysuria    Uncomfortable going to the bathroom started about 3 weeks ago, also feels uncomfortable when he is not urinating    HPI   History of Present Illness         C/o a 4 wk hx of penile discomfort. Denies rash or lesions. Denies frequency, urgency or changes in urine stream. Denies fever, chills, abdominal pain, back pain, rectal pain, N/V/D or constipation.  States he saw Dr. Selma, his urologist, for this same issue in the fall. He cannot recall what he was given for this but it helped.    Health Maintenance Due  Topic Date Due   Zoster Vaccines- Shingrix (2 of 2) 01/19/2018   COVID-19 Vaccine (5 - 2024-25 season) 06/07/2023    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   Gastrostomy tube in place [facility]) 2016   intermittent feeding   Hx of basal cell carcinoma    Hyperlipemia    no per pt 04-11-16   Melanoma (HCC)    Prostate cancer (HCC) 09/06/2014   last radiation 4'16    S/P radiation therapy 11/29/2014 through 01/23/2015                                                     Prostate 7800 cGy in 40 sessions, seminal vesicles 5600 cGy in 40 sessions                        Skin cancer, basal cell    Transfusion history    '72 -s/p surgery for duodenal  ulcer, 12/17    Past Surgical History:  Procedure Laterality Date   BALLOON DILATION N/A 09/12/2015   Procedure: BALLOON DILATION;  Surgeon: Lupita FORBES Commander, MD;  Location: [facility];  Service: Endoscopy;  Laterality: N/A;   BALLOON DILATION N/A 09/21/2015   Procedure: BALLOON DILATION;  Surgeon: Lupita FORBES Commander, MD;  Location: [facility];  Service: Endoscopy;  Laterality: N/A;   BALLOON DILATION N/A 10/04/2015   Procedure: BALLOON DILATION;  Surgeon: Toribio SHAUNNA Cedar, MD;  Location: [facility];  Service: Endoscopy;  Laterality: N/A;   BALLOON DILATION N/A 12/10/2015   Procedure: BALLOON DILATION;  Surgeon: Lupita FORBES Commander, MD;  Location: [facility];  Service: Endoscopy;  Laterality: N/A;   BALLOON DILATION N/A 10/10/2016   Procedure: BALLOON DILATION;  Surgeon: Lupita FORBES Commander, MD;  Location: [facility];  Service: Endoscopy;  Laterality: N/A;   CATARACT EXTRACTION, BILATERAL Bilateral    COLONOSCOPY     colonoscopy with polypectomy  02/2012   Dr Jakie   ESOPHAGOGASTRODUODENOSCOPY (EGD) WITH PROPOFOL  N/A 08/17/2015   Procedure: ESOPHAGOGASTRODUODENOSCOPY (EGD) WITH PROPOFOL ;  Surgeon: Lupita FORBES Commander, MD;  Location: [facility];  Service: Endoscopy;  Laterality: N/A;   ESOPHAGOGASTRODUODENOSCOPY (EGD) WITH PROPOFOL  N/A 09/12/2015   Procedure: ESOPHAGOGASTRODUODENOSCOPY (EGD) WITH PROPOFOL ;  Surgeon: Lupita FORBES Commander, MD;  [facility]: [facility];  Service: Endoscopy;  Laterality: N/A;   ESOPHAGOGASTRODUODENOSCOPY (EGD) WITH PROPOFOL  N/A 09/21/2015   Procedure: ESOPHAGOGASTRODUODENOSCOPY (EGD) WITH PROPOFOL ;  Surgeon: Lupita FORBES Commander, MD;  [facility]: [facility];  Service: Endoscopy;  Laterality: N/A;   ESOPHAGOGASTRODUODENOSCOPY (EGD) WITH PROPOFOL  N/A 10/04/2015   Procedure: ESOPHAGOGASTRODUODENOSCOPY (EGD) WITH PROPOFOL ;  Surgeon: Toribio SHAUNNA Cedar, MD;  Location: [facility];  Service: Endoscopy;  Laterality: N/A;  possible stricture   ESOPHAGOGASTRODUODENOSCOPY (EGD) WITH PROPOFOL  N/A  12/10/2015   Procedure: ESOPHAGOGASTRODUODENOSCOPY (EGD) WITH PROPOFOL ;  Surgeon: Lupita FORBES Commander, MD;  [facility]: [facility];  Service: Endoscopy;  Laterality: N/A;   ESOPHAGOGASTRODUODENOSCOPY (EGD) WITH PROPOFOL  N/A 02/06/2016   Procedure: ESOPHAGOGASTRODUODENOSCOPY (EGD) WITH PROPOFOL ;  Surgeon: Lupita FORBES Commander, MD;  [facility]: [facility];  Service: Endoscopy;  Laterality: N/A;   ESOPHAGOGASTRODUODENOSCOPY (EGD) WITH PROPOFOL  N/A 10/10/2016   Procedure: ESOPHAGOGASTRODUODENOSCOPY (EGD) WITH PROPOFOL ;  Surgeon: Lupita FORBES Commander, MD;  Location: [facility];  Service: Endoscopy;  Laterality: N/A;  needs APC    EUS N/A 06/14/2015   Procedure: ESOPHAGEAL ENDOSCOPIC ULTRASOUND (EUS) RADIAL;  Surgeon: Toribio SHAUNNA Cedar, MD;  Location: [facility];  Service: Endoscopy;  Laterality: N/A;   HOT HEMOSTASIS N/A 10/10/2016   Procedure: HOT HEMOSTASIS (ARGON PLASMA COAGULATION/BICAP);  Surgeon: Lupita FORBES Commander, MD;  Location: [facility];  Service: Endoscopy;  Laterality: N/A;   KNEE ARTHROSCOPY Right    Right Knee, [place] [facility]   LAPAROSCOPIC GASTRECTOMY N/A 07/18/2015   Procedure: SUB TOTAL GASTRECTOMY;  Surgeon: Jina Nephew, MD;  Location: [facility];  Service: General;  Laterality: N/A;   LAPAROSCOPIC GASTROSTOMY N/A 07/18/2015   Procedure: FEEDING TUBE PLACEMENT;  Surgeon: Jina Nephew, MD;  Location: [facility];  Service: General;  Laterality: N/A;   LAPAROSCOPY N/A 07/18/2015   Procedure: LAPAROSCOPY DIAGNOSTIC;  Surgeon: Jina Nephew, MD;  Location: [facility];  Service: General;  Laterality: N/A;   MELANOMA EXCISION  02-01-16   abdominal wall   MOHS SURGERY  05/2020   nose   PROSTATE BIOPSY  09/06/14   ROTATOR CUFF REPAIR Right    R shoulder, [place]   SAVORY DILATION N/A 02/06/2016   Procedure: SAVORY DILATION;  Surgeon: Lupita FORBES Commander, MD;  Location: [facility];  Service: Endoscopy;  Laterality: N/A;   TOTAL SHOULDER ARTHROPLASTY Left 05/07/2017   Procedure: LEFT REVERSE TOTAL SHOULDER ARTHROPLASTY;  Surgeon:  Melita Drivers, MD;  Location: [facility];  Service: Orthopedics;  Laterality: Left;   TRIGGER FINGER RELEASE Left 1998   UPPER GASTROINTESTINAL ENDOSCOPY     x14   VASECTOMY      Family History  Problem Relation Age of Onset   Diabetes Father    Heart attack Father 61   Liver disease Father        Related to Alcohol Use    Alcohol abuse Mother    Cirrhosis Mother        died in her 60s   Multiple myeloma Brother    Diabetes Sister    Colon cancer Neg Hx  Stomach cancer Neg Hx    Stroke Neg Hx    Esophageal cancer Neg Hx    Rectal cancer Neg Hx     Social History   Socioeconomic History   Marital status: Married    Spouse name: Donny   Number of children: 2   Years of education: Not on file   Highest education level: Bachelor's degree (e.g., BA, AB, BS)  Occupational History   Occupation: retired  Tobacco Use   Smoking status: Former    Current packs/day: 0.00    Average packs/day: 0.5 packs/day for 5.0 years (2.5 ttl pk-yrs)    Types: Cigarettes    Start date: 10/06/1962    Quit date: 10/07/1967    Years since quitting: 56.1   Smokeless tobacco: Never   Tobacco comments:    smoked 1963- 1969 , up to < 1 ppd  Vaping Use   Vaping status: Never Used  Substance and Sexual Activity   Alcohol use: Yes    Comment: maybe once a week beer or wine   Drug use: No   Sexual activity: Yes  Other Topics Concern   Not on file  Social History Narrative   Married and retired - 1 son and 1 daughter    Lives at [place] and active golfer, armed forces operational officer.  Second home at [facility], [employer] condominium   Very occasional alcohol, former smoker   Social Drivers of Corporate Investment Banker Strain: Low Risk  (11/13/2023)   Overall Financial Resource Strain (CARDIA)    Difficulty of Paying Living Expenses: Not hard at all  Food Insecurity: No Food Insecurity (11/13/2023)   Hunger Vital Sign    Worried About Running Out of Food in the Last Year: Never true    Ran Out of  Food in the Last Year: Never true  Transportation Needs: No Transportation Needs (11/13/2023)   PRAPARE - Administrator, Civil Service (Medical): No    Lack of Transportation (Non-Medical): No  Physical Activity: Insufficiently Active (11/13/2023)   Exercise Vital Sign    Days of Exercise per Week: 4 days    Minutes of Exercise per Session: 30 min  Stress: No Stress Concern Present (11/13/2023)   [employer] of Occupational Health - Occupational Stress Questionnaire    Feeling of Stress : Only a little  Social Connections: Socially Integrated (11/13/2023)   Social Connection and Isolation Panel [NHANES]    Frequency of Communication with Friends and Family: More than three times a week    Frequency of Social Gatherings with Friends and Family: More than three times a week    Attends Religious Services: More than 4 times per year    Active Member of [employer] or Organizations: Yes    Attends Banker Meetings: 1 to 4 times per year    Marital Status: Married  Catering Manager Violence: Not At Risk (04/07/2023)   Humiliation, Afraid, Rape, and Kick questionnaire    Fear of Current or Ex-Partner: No    Emotionally Abused: No    Physically Abused: No    Sexually Abused: No    Outpatient Medications Prior to Visit  Medication Sig Dispense Refill   acetaminophen  (TYLENOL ) 500 MG tablet Take 1,000 mg by mouth every 6 (six) hours as needed for mild pain.      amoxicillin -clavulanate (AUGMENTIN ) 875-125 MG tablet Take 1 tablet by mouth 2 (two) times daily. 20 tablet 0   Multiple Vitamin (MULTIVITAMIN WITH MINERALS) TABS tablet  Take 1 tablet by mouth every other day.      omeprazole  (PRILOSEC) 40 MG capsule TAKE 1 CAPSULE DAILY 30 MINS BEFORE BREAKFAST OR SUPPER, OKAY TO OPEN AND SPRINKLE ON APPLESAUCE 90 capsule 1   pravastatin  (PRAVACHOL ) 40 MG tablet Take 1 tablet (40 mg total) by mouth daily. TAKE 1 TABLET BY MOUTH EVERYDAY AT BEDTIME Strength: 40 mg 90 tablet 1   No  facility-administered medications prior to visit.    Allergies  Allergen Reactions   Codeine Nausea And Vomiting    Review of Systems  Constitutional:  Negative for chills, fever and malaise/fatigue.  Respiratory:  Negative for shortness of breath.   Cardiovascular:  Negative for chest pain, palpitations and leg swelling.  Gastrointestinal:  Negative for abdominal pain, constipation, diarrhea, nausea and vomiting.  Genitourinary:  Positive for dysuria. Negative for flank pain, frequency, hematuria and urgency.  Musculoskeletal:  Negative for back pain and myalgias.  Skin:  Negative for itching and rash.  Neurological:  Negative for dizziness and focal weakness.       Objective:    Physical Exam Constitutional:      General: He is not in acute distress.    Appearance: He is not ill-appearing.  Eyes:     Extraocular Movements: Extraocular movements intact.     Conjunctiva/sclera: Conjunctivae normal.  Cardiovascular:     Rate and Rhythm: Normal rate.  Pulmonary:     Effort: Pulmonary effort is normal.  Musculoskeletal:     Cervical back: Normal range of motion and neck supple.  Skin:    General: Skin is warm and dry.  Neurological:     General: No focal deficit present.     Mental Status: He is alert and oriented to person, place, and time.  Psychiatric:        Mood and Affect: Mood normal.        Behavior: Behavior normal.        Thought Content: Thought content normal.      BP 118/62 (BP Location: Left Arm, Patient Position: Sitting)   Pulse 60   Temp 97.6 F (36.4 C) (Temporal)   Ht 5' 6 (1.676 m)   Wt 151 lb (68.5 kg)   SpO2 97%   BMI 24.37 kg/m  Wt Readings from Last 3 Encounters:  11/13/23 151 lb (68.5 kg)  08/12/23 146 lb (66.2 kg)  07/16/23 145 lb (65.8 kg)       Assessment & Plan:   Problem List Items Addressed This Visit   None Visit Diagnoses       Dysuria    -  Primary   Relevant Orders   POCT Urinalysis Dipstick (Automated)  (Completed)      UA negative except specific gravity is >1.030. unlikely infectious process. Encouraged better hydration.  He has been very active playing tennis and golf without difficulty. Feels fine o/w Reviewed notes from Dr. Selma visit in August 2024 for this issue and he was advised to take AZO OTC and follow up for pelvic floor therapy or possibly cystoscopy.  He will call and schedule with Dr. Selma.    I am having Alm MYRTIS Bearl Deatrice maintain his multivitamin with minerals, acetaminophen , pravastatin , amoxicillin -clavulanate, and omeprazole .  No orders of the defined types were placed in this encounter.

## 2023-12-07 ENCOUNTER — Ambulatory Visit (INDEPENDENT_AMBULATORY_CARE_PROVIDER_SITE_OTHER): Payer: Medicare Other

## 2023-12-07 DIAGNOSIS — E538 Deficiency of other specified B group vitamins: Secondary | ICD-10-CM

## 2023-12-07 MED ORDER — CYANOCOBALAMIN 1000 MCG/ML IJ SOLN
1000.0000 ug | Freq: Once | INTRAMUSCULAR | Status: AC
  Administered 2023-12-07: 1000 ug via INTRAMUSCULAR

## 2023-12-07 NOTE — Progress Notes (Signed)
 Patient visits today for their b-12 injection. Patient informed of what they had received and tolerated injection well. Patient notified to reach out to office if needed.

## 2023-12-24 DIAGNOSIS — R52 Pain, unspecified: Secondary | ICD-10-CM | POA: Diagnosis not present

## 2023-12-24 DIAGNOSIS — M720 Palmar fascial fibromatosis [Dupuytren]: Secondary | ICD-10-CM | POA: Diagnosis not present

## 2024-01-05 DIAGNOSIS — M720 Palmar fascial fibromatosis [Dupuytren]: Secondary | ICD-10-CM | POA: Diagnosis not present

## 2024-01-07 ENCOUNTER — Ambulatory Visit (INDEPENDENT_AMBULATORY_CARE_PROVIDER_SITE_OTHER)

## 2024-01-07 DIAGNOSIS — E538 Deficiency of other specified B group vitamins: Secondary | ICD-10-CM | POA: Diagnosis not present

## 2024-01-07 MED ORDER — CYANOCOBALAMIN 1000 MCG/ML IJ SOLN
1000.0000 ug | Freq: Once | INTRAMUSCULAR | Status: AC
Start: 2024-01-07 — End: 2024-01-07
  Administered 2024-01-07: 1000 ug via INTRAMUSCULAR

## 2024-01-07 NOTE — Progress Notes (Signed)
 Patient visits today for their b-12 injection. Patient informed of what they had received and tolerated injection well. Patient notified to reach out to office if needed.

## 2024-02-04 ENCOUNTER — Ambulatory Visit (INDEPENDENT_AMBULATORY_CARE_PROVIDER_SITE_OTHER)

## 2024-02-04 DIAGNOSIS — E538 Deficiency of other specified B group vitamins: Secondary | ICD-10-CM

## 2024-02-04 MED ORDER — CYANOCOBALAMIN 1000 MCG/ML IJ SOLN
1000.0000 ug | Freq: Once | INTRAMUSCULAR | Status: AC
  Administered 2024-02-04: 1000 ug via INTRAMUSCULAR

## 2024-02-04 NOTE — Progress Notes (Signed)
Pt here for monthly B12 injection per   B12 1000mcg given IM and pt tolerated injection well.    

## 2024-03-01 DIAGNOSIS — Z961 Presence of intraocular lens: Secondary | ICD-10-CM | POA: Diagnosis not present

## 2024-03-01 DIAGNOSIS — H524 Presbyopia: Secondary | ICD-10-CM | POA: Diagnosis not present

## 2024-03-01 DIAGNOSIS — H35371 Puckering of macula, right eye: Secondary | ICD-10-CM | POA: Diagnosis not present

## 2024-03-07 ENCOUNTER — Ambulatory Visit (INDEPENDENT_AMBULATORY_CARE_PROVIDER_SITE_OTHER)

## 2024-03-07 DIAGNOSIS — E538 Deficiency of other specified B group vitamins: Secondary | ICD-10-CM | POA: Diagnosis not present

## 2024-03-07 MED ORDER — CYANOCOBALAMIN 1000 MCG/ML IJ SOLN
1000.0000 ug | Freq: Once | INTRAMUSCULAR | Status: AC
  Administered 2024-03-07: 1000 ug via INTRAMUSCULAR

## 2024-03-07 NOTE — Progress Notes (Signed)
Pt here for monthly B12 injection per Dr. Lawerance Bach  B12 given IM and pt tolerated injection well.

## 2024-04-06 ENCOUNTER — Ambulatory Visit

## 2024-04-06 DIAGNOSIS — E538 Deficiency of other specified B group vitamins: Secondary | ICD-10-CM

## 2024-04-06 MED ORDER — CYANOCOBALAMIN 1000 MCG/ML IJ SOLN
1000.0000 ug | Freq: Once | INTRAMUSCULAR | Status: AC
  Administered 2024-04-06: 1000 ug via INTRAMUSCULAR

## 2024-04-06 NOTE — Progress Notes (Signed)
 After obtaining consent, and per orders of Dr. Lawerance Bach, injection of B12 given by Ferdie Ping. Patient instructed to report any adverse reaction to me immediately.

## 2024-04-10 ENCOUNTER — Other Ambulatory Visit: Payer: Self-pay | Admitting: Internal Medicine

## 2024-04-11 ENCOUNTER — Ambulatory Visit (INDEPENDENT_AMBULATORY_CARE_PROVIDER_SITE_OTHER): Payer: Medicare Other

## 2024-04-11 VITALS — Ht 66.0 in | Wt 151.0 lb

## 2024-04-11 DIAGNOSIS — Z0000 Encounter for general adult medical examination without abnormal findings: Secondary | ICD-10-CM

## 2024-04-11 NOTE — Patient Instructions (Signed)
 [name] , Thank you for taking time out of your busy schedule to complete your Annual Wellness Visit with me. I enjoyed our conversation and look forward to speaking with you again next year. I, as well as your care team,  appreciate your ongoing commitment to your health goals. Please review the following plan we discussed and let me know if I can assist you in the future. Your Game plan/ To Do List   Follow up Visits: Next Medicare AWV with our clinical staff: 04/12/2025 by Video   Have you seen your provider in the last 6 months (3 months if uncontrolled diabetes)? Yes Next Office Visit with your provider: 05/09/2024  Clinician Recommendations:  Aim for 30 minutes of exercise or brisk walking, 6-8 glasses of water , and 5 servings of fruits and vegetables each day. Educated and advised on getting the 2nd Shingles vaccine in the office or local pharmacy.      This is a list of the screening recommended for you and due dates:  Health Maintenance  Topic Date Due   Zoster (Shingles) Vaccine (2 of 2) 01/19/2018   COVID-19 Vaccine (5 - 2024-25 season) 06/07/2023   Flu Shot  05/06/2024   Medicare Annual Wellness Visit  04/11/2025   DTaP/Tdap/Td vaccine (4 - Td or Tdap) 04/18/2030   Pneumococcal Vaccine for age over 24  Completed   Hepatitis C Screening  Completed   Hepatitis B Vaccine  Aged Out   HPV Vaccine  Aged Out   Meningitis B Vaccine  Aged Out   Colon Cancer Screening  Discontinued    Advanced directives: (Copy Requested) Please bring a copy of your health care power of attorney and living will to the office to be added to your chart at your convenience. You can mail to [facility] [address] [place], [place] [address] or email to [email or web address] Advance Care Planning is important because it:  [x]  Makes sure you receive the medical care that is consistent with your values, goals, and preferences  [x]  It provides guidance to your family and loved ones  and reduces their decisional burden about whether or not they are making the right decisions based on your wishes.  Follow the link provided in your after visit summary or read over the paperwork we have mailed to you to help you started getting your Advance Directives in place. If you need assistance in completing these, please reach out to us  so that we can help you!

## 2024-04-11 NOTE — Progress Notes (Addendum)
 Subjective:  Please attest and cosign this visit due to patients primary care provider not being in the office at the time the visit was completed.   (Pt of Dr Glade Hope)  James SQUIBB Linker is a 78 y.o. who presents for a Medicare Wellness preventive visit.  As a reminder, Annual Wellness Visits don't include a physical exam, and some assessments may be limited, especially if this visit is performed virtually. We may recommend an in-person follow-up visit with your provider if needed.  Visit Complete: Virtual I connected with  James [name] on 04/11/24 by a audio enabled telemedicine application and verified that I am speaking with the correct person using two identifiers.  Patient Location: Home  Provider Location: Office/Clinic  I discussed the limitations of evaluation and management by telemedicine. The patient expressed understanding and agreed to proceed.  Vital Signs: Because this visit was a virtual/telehealth visit, some criteria may be missing or patient reported. Any vitals not documented were not able to be obtained and vitals that have been documented are patient reported.  VideoDeclined- This patient declined Librarian, academic. Therefore the visit was completed with audio only.  Persons Participating in Visit: Patient.  AWV Questionnaire: No: Patient Medicare AWV questionnaire was not completed prior to this visit.  Cardiac Risk Factors include: advanced age (>62men, >62 women);dyslipidemia;male gender     Objective:    Today's Vitals   04/11/24 1053  Weight: 151 lb (68.5 kg)  Height: 5' 6 (1.676 m)   Body mass index is 24.37 kg/m.     04/11/2024   10:53 AM 07/09/2023    7:58 AM 04/07/2023   11:45 AM 07/10/2022    8:04 AM 01/07/2022    8:49 AM 03/18/2021    8:56 AM 10/11/2020    8:45 AM  Advanced Directives  Does Patient Have a Medical Advance Directive? Yes Yes Yes Yes Yes No Yes  Type of Estate agent of  [place];Living will Healthcare Power of [place];Living will Healthcare Power of [place];Living will Healthcare Power of [place];Living will Living will;Healthcare Power of [employer] Power of [place];Living will  Does patient want to make changes to medical advance directive?  No - Patient declined  No - Patient declined No - Patient declined  No - Patient declined  Copy of Healthcare Power of Attorney in Chart? No - copy requested No - copy requested No - copy requested No - copy requested No - copy requested  No - copy requested  Would patient like information on creating a medical advance directive?      No - Patient declined     Current Medications (verified) Outpatient Encounter Medications as of 04/11/2024  Medication Sig   acetaminophen  (TYLENOL ) 500 MG tablet Take 1,000 mg by mouth every 6 (six) hours as needed for mild pain.    Multiple Vitamin (MULTIVITAMIN WITH MINERALS) TABS tablet Take 1 tablet by mouth every other day.    omeprazole  (PRILOSEC) 40 MG capsule TAKE 1 CAPSULE DAILY 30 MINS BEFORE BREAKFAST OR SUPPER, OKAY TO OPEN AND SPRINKLE ON APPLESAUCE   pravastatin  (PRAVACHOL ) 40 MG tablet Take 1 tablet (40 mg total) by mouth daily. TAKE 1 TABLET BY MOUTH EVERYDAY AT BEDTIME Strength: 40 mg   amoxicillin -clavulanate (AUGMENTIN ) 875-125 MG tablet Take 1 tablet by mouth 2 (two) times daily. (Patient not taking: Reported on 04/11/2024)   No facility-administered encounter medications on file as of 04/11/2024.    Allergies (verified) Codeine   History: Past  Medical History:  Diagnosis Date   Abnormal EKG    NS ST-T EKG Changes, (-) Nuclear Stress Test 03/2006   Allergy    Anemia    iron deficency   Arthritis    B12 deficiency 06/05/2015   B12 Low NL at 271 methtylmalonic acid high   Cardiac murmur    nothing to be concerned with    Cataract    extractions   Duodenal ulcer 1972   transfusion required, 1 unit packed cells   Dupuytren's contracture    Dysphagia     history throat/stomach  cancer can swallow some, uses more pureed type foods   Dyspnea    at times   Esophageal reflux    no currlently   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   Gastrostomy tube in place [facility]) 2016   intermittent feeding   Hx of basal cell carcinoma    Hyperlipemia    no per pt 04-11-16   Melanoma (HCC)    Prostate cancer (HCC) 09/06/2014   last radiation 4'16    S/P radiation therapy 11/29/2014 through 01/23/2015                                                     Prostate 7800 cGy in 40 sessions, seminal vesicles 5600 cGy in 40 sessions                        Skin cancer, basal cell    Transfusion history    '72 -s/p surgery for duodenal ulcer, 12/17   Past Surgical History:  Procedure Laterality Date   BALLOON DILATION N/A 09/12/2015   Procedure: BALLOON DILATION;  Surgeon: Lupita FORBES Commander, MD;  Location: [facility];  Service: Endoscopy;  Laterality: N/A;   BALLOON DILATION N/A 09/21/2015   Procedure: BALLOON DILATION;  Surgeon: Lupita FORBES Commander, MD;  Location: [facility];  Service: Endoscopy;  Laterality: N/A;   BALLOON DILATION N/A 10/04/2015   Procedure: BALLOON DILATION;  Surgeon: Toribio SHAUNNA Cedar, MD;  Location: [facility];  Service: Endoscopy;  Laterality: N/A;   BALLOON DILATION N/A 12/10/2015   Procedure: BALLOON DILATION;  Surgeon: Lupita FORBES Commander, MD;  [facility];  Service: Endoscopy;  Laterality: N/A;   BALLOON DILATION N/A 10/10/2016   Procedure: BALLOON DILATION;  Surgeon: Lupita FORBES Commander, MD;  [facility];  Service: Endoscopy;  Laterality: N/A;   CATARACT EXTRACTION, BILATERAL Bilateral    COLONOSCOPY     colonoscopy with polypectomy  02/2012   Dr Jakie   ESOPHAGOGASTRODUODENOSCOPY (EGD) WITH PROPOFOL  N/A 08/17/2015   Procedure: ESOPHAGOGASTRODUODENOSCOPY (EGD) WITH PROPOFOL ;  Surgeon: Lupita FORBES Commander, MD;  Location: [facility]COPY;  Service: Endoscopy;  Laterality: N/A;   ESOPHAGOGASTRODUODENOSCOPY  (EGD) WITH PROPOFOL  N/A 09/12/2015   Procedure: ESOPHAGOGASTRODUODENOSCOPY (EGD) WITH PROPOFOL ;  Surgeon: Lupita FORBES Commander, MD;  [facility];  Service: Endoscopy;  Laterality: N/A;   ESOPHAGOGASTRODUODENOSCOPY (EGD) WITH PROPOFOL  N/A 09/21/2015   Procedure: ESOPHAGOGASTRODUODENOSCOPY (EGD) WITH PROPOFOL ;  Surgeon: Lupita FORBES Commander, MD;  [facility];  Service: Endoscopy;  Laterality: N/A;   ESOPHAGOGASTRODUODENOSCOPY (EGD) WITH PROPOFOL  N/A 10/04/2015   Procedure: ESOPHAGOGASTRODUODENOSCOPY (EGD) WITH PROPOFOL ;  Surgeon: Toribio SHAUNNA Cedar, MD;  [facility];  Service: Endoscopy;  Laterality: N/A;  possible stricture   ESOPHAGOGASTRODUODENOSCOPY (EGD) WITH PROPOFOL  N/A  12/10/2015   Procedure: ESOPHAGOGASTRODUODENOSCOPY (EGD) WITH PROPOFOL ;  Surgeon: Lupita FORBES Commander, MD;  [facility];  Service: Endoscopy;  Laterality: N/A;   ESOPHAGOGASTRODUODENOSCOPY (EGD) WITH PROPOFOL  N/A 02/06/2016   Procedure: ESOPHAGOGASTRODUODENOSCOPY (EGD) WITH PROPOFOL ;  Surgeon: Lupita FORBES Commander, MD;  [facility];  Service: Endoscopy;  Laterality: N/A;   ESOPHAGOGASTRODUODENOSCOPY (EGD) WITH PROPOFOL  N/A 10/10/2016   Procedure: ESOPHAGOGASTRODUODENOSCOPY (EGD) WITH PROPOFOL ;  Surgeon: Lupita FORBES Commander, MD;  [facility];  Service: Endoscopy;  Laterality: N/A;  needs APC    EUS N/A 06/14/2015   Procedure: ESOPHAGEAL ENDOSCOPIC ULTRASOUND (EUS) RADIAL;  Surgeon: Toribio SHAUNNA Cedar, MD;  [facility];  Service: Endoscopy;  Laterality: N/A;   HOT HEMOSTASIS N/A 10/10/2016   Procedure: HOT HEMOSTASIS (ARGON PLASMA COAGULATION/BICAP);  Surgeon: Lupita FORBES Commander, MD;  Location: [facility];  Service: Endoscopy;  Laterality: N/A;   KNEE ARTHROSCOPY Right    Right Knee, [place] [facility]   LAPAROSCOPIC GASTRECTOMY N/A 07/18/2015   Procedure: SUB TOTAL GASTRECTOMY;  Surgeon: Jina Nephew, MD;  [facility]: [facility];  Service: General;  Laterality: N/A;   LAPAROSCOPIC GASTROSTOMY N/A 07/18/2015    Procedure: FEEDING TUBE PLACEMENT;  Surgeon: Jina Nephew, MD;  Location: [facility];  Service: General;  Laterality: N/A;   LAPAROSCOPY N/A 07/18/2015   Procedure: LAPAROSCOPY DIAGNOSTIC;  Surgeon: Jina Nephew, MD;  Location: [facility];  Service: General;  Laterality: N/A;   MELANOMA EXCISION  02-01-16   abdominal wall   MOHS SURGERY  05/2020   nose   PROSTATE BIOPSY  09/06/14   ROTATOR CUFF REPAIR Right    R shoulder, [place]   SAVORY DILATION N/A 02/06/2016   Procedure: SAVORY DILATION;  Surgeon: Lupita FORBES Commander, MD;  Location: [facility];  Service: Endoscopy;  Laterality: N/A;   TOTAL SHOULDER ARTHROPLASTY Left 05/07/2017   Procedure: LEFT REVERSE TOTAL SHOULDER ARTHROPLASTY;  Surgeon: Melita Drivers, MD;  Location: [facility];  Service: Orthopedics;  Laterality: Left;   TRIGGER FINGER RELEASE Left 1998   UPPER GASTROINTESTINAL ENDOSCOPY     x14   VASECTOMY     Family History  Problem Relation Age of Onset   Diabetes Father    Heart attack Father 65   Liver disease Father        Related to Alcohol Use    Alcohol abuse Mother    Cirrhosis Mother        died in her 33s   Multiple myeloma Brother    Diabetes Sister    Colon cancer Neg Hx    Stomach cancer Neg Hx    Stroke Neg Hx    Esophageal cancer Neg Hx    Rectal cancer Neg Hx    Social History   Socioeconomic History   Marital status: Married    Spouse name: Donny   Number of children: 2   Years of education: Not on file   Highest education level: Bachelor's degree (e.g., BA, AB, BS)  Occupational History   Occupation: retired  Tobacco Use   Smoking status: Former    Current packs/day: 0.00    Average packs/day: 0.5 packs/day for 5.0 years (2.5 ttl pk-yrs)    Types: Cigarettes    Start date: 10/06/1962    Quit date: 10/07/1967    Years since quitting: 56.5   Smokeless tobacco: Never   Tobacco comments:    smoked 1963- 1969 , up to < 1 ppd  Vaping Use   Vaping status: Never Used  Substance and Sexual Activity  Alcohol  use: Yes    Alcohol/week: 1.0 standard drink of alcohol    Types: 1 Cans of beer per week    Comment: maybe once a week beer or wine   Drug use: No   Sexual activity: Yes  Other Topics Concern   Not on file  Social History Narrative   Married and retired - 1 son and 1 daughter    Lives at [place] and active golfer, Armed forces operational officer.  Second home at [facility] [employer] condominium   Very occasional alcohol, former smoker   Social Drivers of Corporate investment banker Strain: Low Risk  (04/11/2024)   Overall Financial Resource Strain (CARDIA)    Difficulty of Paying Living Expenses: Not hard at all  Food Insecurity: No Food Insecurity (04/11/2024)   Hunger Vital Sign    Worried About Running Out of Food in the Last Year: Never true    Ran Out of Food in the Last Year: Never true  Transportation Needs: No Transportation Needs (04/11/2024)   PRAPARE - Administrator, Civil Service (Medical): No    Lack of Transportation (Non-Medical): No  Physical Activity: Sufficiently Active (04/11/2024)   Exercise Vital Sign    Days of Exercise per Week: 4 days    Minutes of Exercise per Session: 60 min  Stress: No Stress Concern Present (04/11/2024)   [employer] of Occupational Health - Occupational Stress Questionnaire    Feeling of Stress: Not at all  Social Connections: Socially Integrated (04/11/2024)   Social Connection and Isolation Panel    Frequency of Communication with Friends and Family: More than three times a week    Frequency of Social Gatherings with Friends and Family: More than three times a week    Attends Religious Services: More than 4 times per year    Active Member of [employer] or Organizations: Yes    Attends Engineer, structural: More than 4 times per year    Marital Status: Married    Tobacco Counseling Counseling given: No Tobacco comments: smoked 1963- 1969 , up to < 1 ppd    Clinical Intake:  Pre-visit preparation completed:  Yes  Pain : No/denies pain     BMI - recorded: 24.37 Nutritional Status: BMI of 19-24  Normal Nutritional Risks: None Diabetes: No  Lab Results  Component Value Date   HGBA1C 6.4 08/12/2023   HGBA1C 6.2 05/06/2023   HGBA1C 6.3 08/05/2022     How often do you need to have someone help you when you read instructions, pamphlets, or other written materials from your doctor or pharmacy?: 1 - Never  Interpreter Needed?: No  Information entered by :: Verdie Saba, CMA   Activities of Daily Living     04/11/2024   10:56 AM  In your present state of health, do you have any difficulty performing the following activities:  Hearing? 0  Vision? 0  Difficulty concentrating or making decisions? 0  Walking or climbing stairs? 0  Dressing or bathing? 0  Doing errands, shopping? 0  Preparing Food and eating ? N  Using the Toilet? N  In the past six months, have you accidently leaked urine? Y  Do you have problems with loss of bowel control? N  Managing your Medications? N  Managing your Finances? N  Housekeeping or managing your Housekeeping? N    Patient Care Team: Geofm Glade PARAS, MD as PCP - General (Internal Medicine) Cloretta Arley NOVAK, MD as Consulting Physician (Oncology)  Avram Lupita BRAVO, MD as Consulting Physician (Gastroenterology)  I have updated your Care Teams any recent Medical Services you may have received from other providers in the past year.     Assessment:   This is a routine wellness examination for James [name].  Hearing/Vision screen Hearing Screening - Comments:: Denies hearing difficulties  - wears hearing aids Vision Screening - Comments:: Wears rx glasses - appt w/new Ophthalmologist    Goals Addressed               This Visit's Progress     Patient Stated (pt-stated)        Patient stated he plans to continue staying active (Tennis/golf) - maintain energy       Depression Screen     04/11/2024   10:58 AM 05/06/2023    8:04 AM 04/07/2023   11:38  AM 10/17/2022    7:54 AM 01/07/2022    8:58 AM 07/13/2019    9:15 AM 06/29/2018    8:34 AM  PHQ 2/9 Scores  PHQ - 2 Score 0 0 0 0 0 0 0  PHQ- 9 Score 0  0        Fall Risk     04/11/2024   10:57 AM 05/06/2023    8:04 AM 04/07/2023   11:47 AM 10/17/2022    7:54 AM 01/07/2022    8:52 AM  Fall Risk   Falls in the past year? 0 1 1 0 0  Number falls in past yr: 0 0 0 0 0  Injury with Fall? 0 0 0 0 0  Risk for fall due to : No Fall Risks History of fall(s) No Fall Risks No Fall Risks No Fall Risks  Risk for fall due to: Comment   fell playing tennis; no injury or ER visit    Follow up Falls evaluation completed;Falls prevention discussed Falls evaluation completed Falls prevention discussed Falls evaluation completed  Falls evaluation completed      Data saved with a previous flowsheet row definition    MEDICARE RISK AT HOME:  Medicare Risk at Home Any stairs in or around the home?: No If so, are there any without handrails?: No Home free of loose throw rugs in walkways, pet beds, electrical cords, etc?: Yes Adequate lighting in your home to reduce risk of falls?: Yes Life alert?: No Use of a cane, walker or w/c?: No Grab bars in the bathroom?: No Shower chair or bench in shower?: No Elevated toilet seat or a handicapped toilet?: No  TIMED UP AND GO:  Was the test performed?  No  Cognitive Function: 6CIT completed        04/11/2024   11:00 AM 04/07/2023   11:48 AM 01/07/2022    9:01 AM  6CIT Screen  What Year? 0 points 0 points 0 points  What month? 0 points 0 points 0 points  What time? 0 points 0 points 0 points  Count back from 20 0 points 0 points 0 points  Months in reverse 0 points 0 points 0 points  Repeat phrase 2 points 0 points 0 points  Total Score 2 points 0 points 0 points    Immunizations Immunization History  Administered Date(s) Administered   Fluad Quad(high Dose 65+) 06/16/2019, 09/03/2020, 06/12/2021, 07/10/2022   Fluad Trivalent(High Dose 65+) 07/09/2023    Influenza Whole 08/07/2009   Influenza, High Dose Seasonal PF 07/05/2015, 10/07/2015, 06/30/2016, 06/06/2017, 07/01/2017, 06/29/2018   Influenza,inj,Quad PF,6+ Mos 09/20/2013   Influenza-Unspecified 07/05/2016, 07/01/2017, 06/07/2019, 10/06/2020  PFIZER Comirnaty(Gray Top)Covid-19 Tri-Sucrose Vaccine 03/12/2021   PFIZER(Purple Top)SARS-COV-2 Vaccination 10/28/2019, 11/18/2019, 08/04/2020   Pneumococcal Conjugate-13 05/04/2014, 07/05/2016   Pneumococcal Polysaccharide-23 12/27/2010, 06/30/2016   Td 12/21/2009   Tdap 08/26/2016, 04/18/2020   Zoster Recombinant(Shingrix) 11/24/2017   Zoster, Live 04/15/2010    Screening Tests Health Maintenance  Topic Date Due   Zoster Vaccines- Shingrix (2 of 2) 01/19/2018   COVID-19 Vaccine (5 - 2024-25 season) 06/07/2023   INFLUENZA VACCINE  05/06/2024   Medicare Annual Wellness (AWV)  04/11/2025   DTaP/Tdap/Td (4 - Td or Tdap) 04/18/2030   Pneumococcal Vaccine: 50+ Years  Completed   Hepatitis C Screening  Completed   Hepatitis B Vaccines  Aged Out   HPV VACCINES  Aged Out   Meningococcal B Vaccine  Aged Out   Colonoscopy  Discontinued    Health Maintenance  Health Maintenance Due  Topic Date Due   Zoster Vaccines- Shingrix (2 of 2) 01/19/2018   COVID-19 Vaccine (5 - 2024-25 season) 06/07/2023   Health Maintenance Items Addressed:  Advised pt on getting the 2nd Shingles vaccine at office or local pharmacy.  Pt agreed to do so.   Additional Screening:  Vision Screening: Recommended annual ophthalmology exams for early detection of glaucoma and other disorders of the eye. Would you like a referral to an eye doctor? No  Patient stated he has scheduled an appt w/a new Ophthalmologist (unable to recall name) for 2025.  Dental Screening: Recommended annual dental exams for proper oral hygiene  Community Resource Referral / Chronic Care Management: CRR required this visit?  No   CCM required this visit?  No   Plan:    I have  personally reviewed and noted the following in the patient's chart:   Medical and social history Use of alcohol, tobacco or illicit drugs  Current medications and supplements including opioid prescriptions. Patient is not currently taking opioid prescriptions. Functional ability and status Nutritional status Physical activity Advanced directives List of other physicians Hospitalizations, surgeries, and ER visits in previous 12 months Vitals Screenings to include cognitive, depression, and falls Referrals and appointments  In addition, I have reviewed and discussed with patient certain preventive protocols, quality metrics, and best practice recommendations. A written personalized care plan for preventive services as well as general preventive health recommendations were provided to patient.   Verdie CHRISTELLA Saba, CMA   04/11/2024   After Visit Summary: (MyChart) Due to this being a telephonic visit, the after visit summary with patients personalized plan was offered to patient via MyChart   Notes: Nothing significant to report at this time.

## 2024-04-20 LAB — LAB REPORT - SCANNED: A1c: 6.2

## 2024-05-04 ENCOUNTER — Ambulatory Visit (INDEPENDENT_AMBULATORY_CARE_PROVIDER_SITE_OTHER): Admitting: Gastroenterology

## 2024-05-04 ENCOUNTER — Encounter: Payer: Self-pay | Admitting: Gastroenterology

## 2024-05-04 VITALS — BP 110/70 | HR 67 | Ht 66.0 in | Wt 143.6 lb

## 2024-05-04 DIAGNOSIS — K3189 Other diseases of stomach and duodenum: Secondary | ICD-10-CM

## 2024-05-04 DIAGNOSIS — R634 Abnormal weight loss: Secondary | ICD-10-CM | POA: Insufficient documentation

## 2024-05-04 DIAGNOSIS — Z87891 Personal history of nicotine dependence: Secondary | ICD-10-CM | POA: Diagnosis not present

## 2024-05-04 DIAGNOSIS — Z85028 Personal history of other malignant neoplasm of stomach: Secondary | ICD-10-CM | POA: Diagnosis not present

## 2024-05-04 DIAGNOSIS — R1310 Dysphagia, unspecified: Secondary | ICD-10-CM | POA: Diagnosis not present

## 2024-05-04 DIAGNOSIS — R1319 Other dysphagia: Secondary | ICD-10-CM

## 2024-05-04 NOTE — Patient Instructions (Signed)
 You have been scheduled for an endoscopy. Please follow written instructions given to you at your visit today.  If you use inhalers (even only as needed), please bring them with you on the day of your procedure.  If you take any of the following medications, they will need to be adjusted prior to your procedure:   DO NOT TAKE 7 DAYS PRIOR TO TEST- Trulicity (dulaglutide) Ozempic, Wegovy (semaglutide) Mounjaro (tirzepatide) Bydureon Bcise (exanatide extended release)  DO NOT TAKE 1 DAY PRIOR TO YOUR TEST Rybelsus (semaglutide) Adlyxin (lixisenatide) Victoza (liraglutide) Byetta (exanatide) ___________________________________________________________________________   _______________________________________________________  If your blood pressure at your visit was 140/90 or greater, please contact your primary care physician to follow up on this.  _______________________________________________________  If you are age 21 or older, your body mass index should be between 23-30. Your Body mass index is 23.18 kg/m. If this is out of the aforementioned range listed, please consider follow up with your Primary Care Provider.  If you are age 77 or younger, your body mass index should be between 19-25. Your Body mass index is 23.18 kg/m. If this is out of the aformentioned range listed, please consider follow up with your Primary Care Provider.   ________________________________________________________  The [facility] providers would like to encourage you to use MYCHART to communicate with providers for non-urgent requests or questions.  Due to long hold times on the telephone, sending your provider a message by [facility] may be a faster and more efficient way to get a response.  Please allow 48 business hours for a response.  Please remember that this is for non-urgent requests.  _______________________________________________________  [facility] is using a team-based approach  to care.  Your team is made up of your doctor and two to three APPS. Our APPS (Nurse Practitioners and Physician Assistants) work with your physician to ensure care continuity for you. They are fully qualified to address your health concerns and develop a treatment plan. They communicate directly with your gastroenterologist to care for you. Seeing the Advanced Practice Practitioners on your physician's team can help you by facilitating care more promptly, often allowing for earlier appointments, access to diagnostic testing, procedures, and other specialty referrals.   Thank you for choosing me and [facility].   Jessica Zehr, PA-C

## 2024-05-04 NOTE — Progress Notes (Signed)
 05/04/2024 James [name] [id number] Jul 29, 1946   HISTORY OF PRESENT ILLNESS:  This is a 79 year old male who is a patient of Dr. Darilyn.  Here today with complaints of recurrent dysphagia and weight loss.  Down 8 pounds recently due to symptoms.  Still golfing and playing tennis.  Reports having issues with food digestion.  Likely due to previous surgery/subtotal gastrectomy.  Having symptoms again for the past 2 months.  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 Dilated. 20 mm - A proximal hemi- gastrectomy were found. - Normal examined duodenum. - No specimens collected.   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   Gastrostomy tube in place [facility]) 2016   intermittent feeding   Hx of basal cell carcinoma    Hyperlipemia    no per pt 04-11-16   Melanoma (HCC)    Prostate cancer (HCC) 09/06/2014   last radiation 4'16    S/P radiation therapy 11/29/2014 through 01/23/2015                                                     Prostate 7800 cGy in 40 sessions, seminal vesicles  5600 cGy in 40 sessions                        Skin cancer, basal cell    Transfusion history    '72 -s/p surgery for duodenal ulcer, 12/17   Past Surgical History:  Procedure Laterality Date   BALLOON DILATION N/A 09/12/2015   Procedure: BALLOON DILATION;  Surgeon: Lupita FORBES Commander, MD;  Location: [facility];  Service: Endoscopy;  Laterality: N/A;   BALLOON DILATION N/A 09/21/2015   Procedure: BALLOON DILATION;  Surgeon: Lupita FORBES Commander, MD;  Location: [facility];  Service: Endoscopy;  Laterality: N/A;   BALLOON DILATION N/A 10/04/2015   Procedure: BALLOON DILATION;  Surgeon: Toribio SHAUNNA Cedar, MD;  Location: [facility];  Service: Endoscopy;  Laterality: N/A;   BALLOON DILATION N/A 12/10/2015   Procedure: BALLOON DILATION;  Surgeon: Lupita FORBES Commander, MD;  Location: [facility];  Service: Endoscopy;  Laterality: N/A;   BALLOON DILATION N/A 10/10/2016   Procedure: BALLOON DILATION;  Surgeon: Lupita FORBES Commander, MD;  Location: [facility];  Service: Endoscopy;  Laterality: N/A;   CATARACT EXTRACTION, BILATERAL Bilateral  COLONOSCOPY     colonoscopy with polypectomy  02/2012   Dr Jakie   ESOPHAGOGASTRODUODENOSCOPY (EGD) WITH PROPOFOL  N/A 08/17/2015   Procedure: ESOPHAGOGASTRODUODENOSCOPY (EGD) WITH PROPOFOL ;  Surgeon: Lupita FORBES Commander, MD;  Location: [facility] ENDOSCOPY;  Service: Endoscopy;  Laterality: N/A;   ESOPHAGOGASTRODUODENOSCOPY (EGD) WITH PROPOFOL  N/A 09/12/2015   Procedure: ESOPHAGOGASTRODUODENOSCOPY (EGD) WITH PROPOFOL ;  Surgeon: Lupita FORBES Commander, MD;  Location: [facility];  Service: Endoscopy;  Laterality: N/A;   ESOPHAGOGASTRODUODENOSCOPY (EGD) WITH PROPOFOL  N/A 09/21/2015   Procedure: ESOPHAGOGASTRODUODENOSCOPY (EGD) WITH PROPOFOL ;  Surgeon: Lupita FORBES Commander, MD;  Location: [facility];  Service: Endoscopy;  Laterality: N/A;   ESOPHAGOGASTRODUODENOSCOPY (EGD) WITH PROPOFOL  N/A 10/04/2015   Procedure: ESOPHAGOGASTRODUODENOSCOPY (EGD) WITH PROPOFOL ;  Surgeon: Toribio SHAUNNA Cedar, MD;  Location:  [facility];  Service: Endoscopy;  Laterality: N/A;  possible stricture   ESOPHAGOGASTRODUODENOSCOPY (EGD) WITH PROPOFOL  N/A 12/10/2015   Procedure: ESOPHAGOGASTRODUODENOSCOPY (EGD) WITH PROPOFOL ;  Surgeon: Lupita FORBES Commander, MD;  Location: [facility];  Service: Endoscopy;  Laterality: N/A;   ESOPHAGOGASTRODUODENOSCOPY (EGD) WITH PROPOFOL  N/A 02/06/2016   Procedure: ESOPHAGOGASTRODUODENOSCOPY (EGD) WITH PROPOFOL ;  Surgeon: Lupita FORBES Commander, MD;  Location: [facility];  Service: Endoscopy;  Laterality: N/A;   ESOPHAGOGASTRODUODENOSCOPY (EGD) WITH PROPOFOL  N/A 10/10/2016   Procedure: ESOPHAGOGASTRODUODENOSCOPY (EGD) WITH PROPOFOL ;  Surgeon: Lupita FORBES Commander, MD;  Location: [facility];  Service: Endoscopy;  Laterality: N/A;  needs APC    EUS N/A 06/14/2015   Procedure: ESOPHAGEAL ENDOSCOPIC ULTRASOUND (EUS) RADIAL;  Surgeon: Toribio SHAUNNA Cedar, MD;  Location: [facility];  Service: Endoscopy;  Laterality: N/A;   HOT HEMOSTASIS N/A 10/10/2016   Procedure: HOT HEMOSTASIS (ARGON PLASMA COAGULATION/BICAP);  Surgeon: Lupita FORBES Commander, MD;  Location: [facility];  Service: Endoscopy;  Laterality: N/A;   KNEE ARTHROSCOPY Right    Right Knee, [place] [facility]   LAPAROSCOPIC GASTRECTOMY N/A 07/18/2015   Procedure: SUB TOTAL GASTRECTOMY;  Surgeon: Jina Nephew, MD;  Location: [facility];  Service: General;  Laterality: N/A;   LAPAROSCOPIC GASTROSTOMY N/A 07/18/2015   Procedure: FEEDING TUBE PLACEMENT;  Surgeon: Jina Nephew, MD;  Location: [facility];  Service: General;  Laterality: N/A;   LAPAROSCOPY N/A 07/18/2015   Procedure: LAPAROSCOPY DIAGNOSTIC;  Surgeon: Jina Nephew, MD;  Location: [facility];  Service: General;  Laterality: N/A;   MELANOMA EXCISION  02-01-16   abdominal wall   MOHS SURGERY  05/2020   nose   PROSTATE BIOPSY  09/06/14   ROTATOR CUFF REPAIR Right    R shoulder, [place]   SAVORY DILATION N/A 02/06/2016   Procedure: SAVORY DILATION;  Surgeon: Lupita FORBES Commander, MD;  Location: [facility];  Service: Endoscopy;   Laterality: N/A;   TOTAL SHOULDER ARTHROPLASTY Left 05/07/2017   Procedure: LEFT REVERSE TOTAL SHOULDER ARTHROPLASTY;  Surgeon: Melita Drivers, MD;  Location: [facility];  Service: Orthopedics;  Laterality: Left;   TRIGGER FINGER RELEASE Left 1998   UPPER GASTROINTESTINAL ENDOSCOPY     x14   VASECTOMY      reports that he quit smoking about 56 years ago. His smoking use included cigarettes. He started smoking about 61 years ago. He has a 2.5 pack-year smoking history. He has never used smokeless tobacco. He reports current alcohol use of about 1.0 standard drink of alcohol per week. He reports that he does not use drugs. family history includes Alcohol abuse in his mother; Cirrhosis in his mother; Diabetes in his father and sister; Heart attack (age of onset: 46) in his father; Liver disease in his father; Multiple myeloma  in his brother. Allergies  Allergen Reactions   Codeine Nausea And Vomiting      Outpatient Encounter Medications as of 05/04/2024  Medication Sig   acetaminophen  (TYLENOL ) 500 MG tablet Take 1,000 mg by mouth every 6 (six) hours as needed for mild pain.    Multiple Vitamin (MULTIVITAMIN WITH MINERALS) TABS tablet Take 1 tablet by mouth every other day.    omeprazole  (PRILOSEC) 40 MG capsule TAKE 1 CAPSULE DAILY 30 MINS BEFORE BREAKFAST OR SUPPER, OKAY TO OPEN AND SPRINKLE ON APPLESAUCE   pravastatin  (PRAVACHOL ) 40 MG tablet Take 1 tablet (40 mg total) by mouth daily. TAKE 1 TABLET BY MOUTH EVERYDAY AT BEDTIME Strength: 40 mg   [DISCONTINUED] amoxicillin -clavulanate (AUGMENTIN ) 875-125 MG tablet Take 1 tablet by mouth 2 (two) times daily. (Patient not taking: Reported on 04/11/2024)   No facility-administered encounter medications on file as of 05/04/2024.    REVIEW OF SYSTEMS  : All other systems reviewed and negative except where noted in the History of Present Illness.   PHYSICAL EXAM: BP 110/70   Pulse 67   Ht 5' 6 (1.676 m)   Wt 143 lb 9.6 oz (65.1 kg)   BMI 23.18  kg/m  General: Well developed white male in no acute distress Head: Normocephalic and atraumatic Eyes:  Sclerae anicteric, conjunctiva pink. Ears: Normal auditory acuity Lungs: Clear throughout to auscultation; no W/R/R. Heart: Regular rate and rhythm; no M/R/G. Musculoskeletal: Symmetrical with no gross deformities  Skin: No lesions on visible extremities Extremities: No edema  Neurological: Alert oriented x 4, grossly non-focal Psychological:  Alert and cooperative. Normal mood and affect  ASSESSMENT AND PLAN: *Dysphagia, weight loss:  History of recurrent GE anastomotic stricture requiring numerous dilations. *History of adenocarcinoma of the stomach in 2016 with subtotal gastrectomy.  -Will plan for EGD with dilation with Dr. Avram.  The risks, benefits, and alternatives to EGD with dilation were discussed with the patient and he consents to proceed.    CC:  Geofm Glade PARAS, MD

## 2024-05-09 ENCOUNTER — Other Ambulatory Visit (INDEPENDENT_AMBULATORY_CARE_PROVIDER_SITE_OTHER)

## 2024-05-09 ENCOUNTER — Ambulatory Visit (INDEPENDENT_AMBULATORY_CARE_PROVIDER_SITE_OTHER)

## 2024-05-09 ENCOUNTER — Encounter: Payer: Self-pay | Admitting: Internal Medicine

## 2024-05-09 ENCOUNTER — Ambulatory Visit: Admitting: Internal Medicine

## 2024-05-09 VITALS — BP 95/59 | HR 56 | Temp 97.6°F | Resp 12 | Ht 66.0 in | Wt 143.0 lb

## 2024-05-09 DIAGNOSIS — K2950 Unspecified chronic gastritis without bleeding: Secondary | ICD-10-CM | POA: Diagnosis not present

## 2024-05-09 DIAGNOSIS — K222 Esophageal obstruction: Secondary | ICD-10-CM | POA: Diagnosis not present

## 2024-05-09 DIAGNOSIS — D649 Anemia, unspecified: Secondary | ICD-10-CM | POA: Diagnosis not present

## 2024-05-09 DIAGNOSIS — K31A15 Gastric intestinal metaplasia without dysplasia, involving multiple sites: Secondary | ICD-10-CM | POA: Diagnosis not present

## 2024-05-09 DIAGNOSIS — K3189 Other diseases of stomach and duodenum: Secondary | ICD-10-CM | POA: Diagnosis not present

## 2024-05-09 DIAGNOSIS — E538 Deficiency of other specified B group vitamins: Secondary | ICD-10-CM | POA: Diagnosis not present

## 2024-05-09 DIAGNOSIS — K9189 Other postprocedural complications and disorders of digestive system: Secondary | ICD-10-CM | POA: Diagnosis not present

## 2024-05-09 DIAGNOSIS — R1310 Dysphagia, unspecified: Secondary | ICD-10-CM | POA: Diagnosis not present

## 2024-05-09 DIAGNOSIS — Z903 Acquired absence of stomach [part of]: Secondary | ICD-10-CM

## 2024-05-09 DIAGNOSIS — K2270 Barrett's esophagus without dysplasia: Secondary | ICD-10-CM

## 2024-05-09 DIAGNOSIS — K929 Disease of digestive system, unspecified: Secondary | ICD-10-CM | POA: Diagnosis not present

## 2024-05-09 LAB — IBC + FERRITIN
Ferritin: 10.3 ng/mL — ABNORMAL LOW (ref 22.0–322.0)
Iron: 56 ug/dL (ref 42–165)
Saturation Ratios: 14.6 % — ABNORMAL LOW (ref 20.0–50.0)
TIBC: 383.6 ug/dL (ref 250.0–450.0)
Transferrin: 274 mg/dL (ref 212.0–360.0)

## 2024-05-09 LAB — CBC
HCT: 39.7 % (ref 39.0–52.0)
Hemoglobin: 12.9 g/dL — ABNORMAL LOW (ref 13.0–17.0)
MCHC: 32.5 g/dL (ref 30.0–36.0)
MCV: 91.3 fl (ref 78.0–100.0)
Platelets: 215 K/uL (ref 150.0–400.0)
RBC: 4.35 Mil/uL (ref 4.22–5.81)
RDW: 16.6 % — ABNORMAL HIGH (ref 11.5–15.5)
WBC: 3.4 K/uL — ABNORMAL LOW (ref 4.0–10.5)

## 2024-05-09 MED ORDER — SODIUM CHLORIDE 0.9 % IV SOLN: 500.0000 mL | Freq: Once | INTRAVENOUS | Status: DC

## 2024-05-09 MED ORDER — CYANOCOBALAMIN 1000 MCG/ML IJ SOLN
1000.0000 ug | Freq: Once | INTRAMUSCULAR | Status: AC
  Administered 2024-05-09: 1000 ug via INTRAMUSCULAR

## 2024-05-09 NOTE — Progress Notes (Signed)
 After obtaining consent, and per orders of Dr. Lawerance Bach, injection of B12 given by Ferdie Ping. Patient instructed to report any adverse reaction to me immediately.

## 2024-05-09 NOTE — Progress Notes (Signed)
 History and Physical Interval Note:  05/09/2024 1:25 PM  James [name]  has presented today for endoscopic procedure(s), with the diagnosis of  Encounter Diagnosis  Name Primary?   Dysphagia, unspecified type Yes  .  The various methods of evaluation and treatment have been discussed with the patient and/or family. After consideration of risks, benefits and other options for treatment, the patient has consented to  the endoscopic procedure(s).   The patient's history has been reviewed, patient examined, no change in status, stable for endoscopic procedure(s).  I have reviewed the patient's chart and labs.  Questions were answered to the patient's satisfaction.     [name], MD, [facility]

## 2024-05-09 NOTE — Progress Notes (Signed)
 Sedate, gd SR, tolerated procedure well, VSS, report to RN

## 2024-05-09 NOTE — Op Note (Addendum)
 [facility] Patient Name: James [name] Procedure Date: 05/09/2024 1:06 PM MRN: [id number] Endoscopist: Lupita FORBES Commander , MD, [id number] Age: 78 Referring MD:  Date of Birth: [birth year] Gender: Male Account #: [id number] Procedure:                Upper GI endoscopy Indications:              Dysphagia, Weight loss hx proximal gastric cancer                            resected 2016 Medicines:                Monitored Anesthesia Care Procedure:                Pre-Anesthesia Assessment:                           - Prior to the procedure, a History and Physical                            was performed, and patient medications and                            allergies were reviewed. The patient's tolerance of                            previous anesthesia was also reviewed. The risks                            and benefits of the procedure and the sedation                            options and risks were discussed with the patient.                            All questions were answered, and informed consent                            was obtained. Prior Anticoagulants: The patient has                            taken no anticoagulant or antiplatelet agents. ASA                            Grade Assessment: III - A patient with severe                            systemic disease. After reviewing the risks and                            benefits, the patient was deemed in satisfactory                            condition to undergo the procedure.  After obtaining informed consent, the endoscope was                            passed under direct vision. Throughout the                            procedure, the patient's blood pressure, pulse, and                            oxygen saturations were monitored continuously. The                            Olympus Scope [id number] was introduced through the                            mouth, and advanced to the second part  of duodenum.                            The upper GI endoscopy was accomplished without                            difficulty. The patient tolerated the procedure                            well. Scope In: Scope Out: Findings:                 Evidence of a proximal hemigastrectomy were found                            in the cardia, in the gastric fundus and in the                            gastric body.                           A benign-appearing, intrinsic mild stenosis was                            found at the gastroesophageal junction -                            anastomosis. This was traversed. A TTS dilator was                            passed through the scope. Dilation with an 18-19-20                            mm balloon dilator was performed to 20 mm. The                            dilation site was examined and showed no change.  Estimated blood loss: none.                           Localized mucosal changes characterized by polypoid                            mucosa were found at the gastroesophageal junction                            - anastomosis. Biopsies were taken with a cold                            forceps for histology. Verification of patient                            identification for the specimen was done. Estimated                            blood loss was minimal.                           Diffuse moderate mucosal changes characterized by                            congestion, erythema, friability (with contact                            bleeding), granularity and inflammation were found                            in the entire examined stomach. Biopsies were taken                            with a cold forceps for histology. Verification of                            patient identification for the specimen was done.                            Estimated blood loss was minimal.                           The examined duodenum  was normal.                           The gastric remnant was otherwise normal on                            retroflexion. Complications:            No immediate complications. Estimated blood loss:                            Minimal. Estimated Blood Loss:     Estimated blood loss was minimal. Impression:               -  Evidence of previous gastric surgery was found.                           - Stenosis was found at the gastroesophageal                            junction - anastomosis. Dilated. 20 mm. No mucosal                            disruption.                           - Polypoid mucosa mucosa in the gastroesophageal                            junction - anastomosis. Biopsied. This has been                            present in past and is likely post-op deformity.                           - Congested, erythematous, friable (with contact                            bleeding), granular and inflamed mucosa in the                            stomach. Biopsied.                           - Normal examined duodenum. Recommendation:           - Patient has a contact number available for                            emergencies. The signs and symptoms of potential                            delayed complications were discussed with the                            patient. Return to normal activities tomorrow.                            Written discharge instructions were provided to the                            patient.                           - Clear liquids x 1 hour then soft foods rest of                            day. Start prior diet tomorrow.                           -  Continue present medications.                           - Await pathology results.                           Addendum - he has hgb 13 and MCV 88 on [facility], PLT                            and WBC NL - B12 is > 800 and Folate is actually                            high - will do CBC, iron/tibc and ferritin  today Lupita FORBES Commander, MD 05/09/2024 2:00:23 PM This report has been signed electronically.

## 2024-05-09 NOTE — Patient Instructions (Addendum)
 I dilated the esophagus stomach junction like I usually do.  I took some biopsies of the stomach lining.  It looks inflamed and there was a bulge at the top of the stomach where the esophagus joins.  I do not think that is a significant issue but I biopsied that also.  It could be a polyp.  I will let you know the results.  I appreciate the opportunity to care for you. [name], MD, [facility]   Clear liquids x 1 hour, then soft foods rest of the day Start prior diet tomorrow Await pathology results Continue present medications

## 2024-05-10 ENCOUNTER — Telehealth: Payer: Self-pay

## 2024-05-10 ENCOUNTER — Other Ambulatory Visit: Payer: Self-pay

## 2024-05-10 ENCOUNTER — Ambulatory Visit: Payer: Self-pay | Admitting: Internal Medicine

## 2024-05-10 MED ORDER — FERROUS SULFATE 325 (65 FE) MG PO TBEC: 325.0000 mg | DELAYED_RELEASE_TABLET | Freq: Every day | ORAL | 3 refills | Status: DC

## 2024-05-10 NOTE — Telephone Encounter (Signed)
 Attempted f/u call. No answer, left VM.

## 2024-05-11 ENCOUNTER — Encounter: Payer: Self-pay | Admitting: Internal Medicine

## 2024-05-11 NOTE — Telephone Encounter (Signed)
 Patient returning phone call. Please advise, thank you.

## 2024-05-11 NOTE — Progress Notes (Signed)
 Left message on machine to call back

## 2024-05-11 NOTE — Telephone Encounter (Signed)
 Patient is returning a call back to [place]. Patient is requesting a call back. Please advise.

## 2024-05-13 LAB — SURGICAL PATHOLOGY

## 2024-06-08 ENCOUNTER — Encounter

## 2024-06-09 ENCOUNTER — Ambulatory Visit (INDEPENDENT_AMBULATORY_CARE_PROVIDER_SITE_OTHER)

## 2024-06-09 DIAGNOSIS — E538 Deficiency of other specified B group vitamins: Secondary | ICD-10-CM

## 2024-06-09 MED ORDER — CYANOCOBALAMIN 1000 MCG/ML IJ SOLN
1000.0000 ug | Freq: Once | INTRAMUSCULAR | Status: AC
  Administered 2024-06-09: 1000 ug via INTRAMUSCULAR

## 2024-06-09 NOTE — Progress Notes (Signed)
 Pt here for monthly B12 injection per   B12 1000mcg given IM and pt tolerated injection well.  Patient responded well to b12 injection

## 2024-06-10 ENCOUNTER — Ambulatory Visit (AMBULATORY_SURGERY_CENTER)

## 2024-06-10 VITALS — Ht 66.0 in | Wt 143.6 lb

## 2024-06-10 DIAGNOSIS — Z860100 Personal history of colon polyps, unspecified: Secondary | ICD-10-CM

## 2024-06-10 DIAGNOSIS — D509 Iron deficiency anemia, unspecified: Secondary | ICD-10-CM

## 2024-06-10 MED ORDER — NA SULFATE-K SULFATE-MG SULF 17.5-3.13-1.6 GM/177ML PO SOLN: 1.0000 | Freq: Once | ORAL | 0 refills | Status: AC

## 2024-06-10 NOTE — Progress Notes (Signed)
 No egg or soy allergy known to patient  No issues known to pt with past sedation with any surgeries or procedures Patient denies ever being told they had issues or difficulty with intubation  No FH of Malignant Hyperthermia Pt is not on diet pills Pt is not on  home 02  Pt is not on blood thinners  Pt denies issues with constipation  No A fib or A flutter Have any cardiac testing pending--No Pt can ambulate  Pt denies use of chewing tobacco Discussed diabetic I weight loss medication holds Discussed NSAID holds Checked BMI Pt instructed to use Single[employer] or GoodRx for a price reduction on prep  Patient's chart reviewed by Norleen Schillings CNRA prior to previsit and patient appropriate for the [facility].  Pre visit completed and red dot placed by patient's name on their procedure day (on provider's schedule).

## 2024-06-22 DIAGNOSIS — D1801 Hemangioma of skin and subcutaneous tissue: Secondary | ICD-10-CM | POA: Diagnosis not present

## 2024-06-22 DIAGNOSIS — Z85828 Personal history of other malignant neoplasm of skin: Secondary | ICD-10-CM | POA: Diagnosis not present

## 2024-06-22 DIAGNOSIS — D2262 Melanocytic nevi of left upper limb, including shoulder: Secondary | ICD-10-CM | POA: Diagnosis not present

## 2024-06-22 DIAGNOSIS — D225 Melanocytic nevi of trunk: Secondary | ICD-10-CM | POA: Diagnosis not present

## 2024-06-22 DIAGNOSIS — C44311 Basal cell carcinoma of skin of nose: Secondary | ICD-10-CM | POA: Diagnosis not present

## 2024-06-22 DIAGNOSIS — L570 Actinic keratosis: Secondary | ICD-10-CM | POA: Diagnosis not present

## 2024-06-22 DIAGNOSIS — D485 Neoplasm of uncertain behavior of skin: Secondary | ICD-10-CM | POA: Diagnosis not present

## 2024-06-22 DIAGNOSIS — L821 Other seborrheic keratosis: Secondary | ICD-10-CM | POA: Diagnosis not present

## 2024-06-22 DIAGNOSIS — Z85820 Personal history of malignant melanoma of skin: Secondary | ICD-10-CM | POA: Diagnosis not present

## 2024-06-22 DIAGNOSIS — L814 Other melanin hyperpigmentation: Secondary | ICD-10-CM | POA: Diagnosis not present

## 2024-06-22 DIAGNOSIS — D692 Other nonthrombocytopenic purpura: Secondary | ICD-10-CM | POA: Diagnosis not present

## 2024-06-22 NOTE — Progress Notes (Unsigned)
 [facility] History and Physical   Primary Care Physician:  Geofm Glade PARAS, MD   Reason for Procedure:    Encounter Diagnosis  Name Primary?   Iron deficiency anemia, unspecified iron deficiency anemia type Yes     Plan:    Colonoscopy     HPI: James [name] is a 78 y.o. 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  Recent EGD with dilation of stricture again no abnormalities to explain iron deficiency.  The patient had foregone surveillance colonoscopy, he did have 2 small adenomas removed in 2013 by Dr. Jakie.  Left-sided diverticulosis also seen.   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   Gastrostomy tube in place [facility]) 2016   intermittent feeding   Hx of basal cell carcinoma    Hyperlipemia    no per pt 04-11-16   Melanoma (HCC)    Prostate cancer (HCC) 09/06/2014   last radiation 4'16    S/P radiation therapy 11/29/2014 through 01/23/2015                                                     Prostate 7800 cGy in 40 sessions, seminal vesicles 5600 cGy in 40 sessions                        Skin cancer, basal cell    Transfusion history    '72 -s/p surgery for duodenal ulcer, 12/17    Past Surgical History:  Procedure Laterality Date    BALLOON DILATION N/A 09/12/2015   Procedure: BALLOON DILATION;  Surgeon: Lupita FORBES Commander, MD;  Location: [facility];  Service: Endoscopy;  Laterality: N/A;   BALLOON DILATION N/A 09/21/2015   Procedure: BALLOON DILATION;  Surgeon: Lupita FORBES Commander, MD;  Location: [facility];  Service: Endoscopy;  Laterality: N/A;   BALLOON DILATION N/A 10/04/2015   Procedure: BALLOON DILATION;  Surgeon: Toribio SHAUNNA Cedar, MD;  Location: [facility];  Service: Endoscopy;  Laterality: N/A;   BALLOON DILATION N/A 12/10/2015   Procedure: BALLOON DILATION;  Surgeon: Lupita FORBES Commander, MD;  Location: [facility];  Service: Endoscopy;  Laterality: N/A;   BALLOON DILATION N/A 10/10/2016   Procedure: BALLOON DILATION;  Surgeon: Lupita FORBES Commander, MD;  Location: [facility];  Service: Endoscopy;  Laterality: N/A;   CATARACT EXTRACTION, BILATERAL Bilateral    COLONOSCOPY  2018   colonoscopy with polypectomy  02/2012   Dr Jakie   ESOPHAGOGASTRODUODENOSCOPY (EGD) WITH PROPOFOL  N/A  08/17/2015   Procedure: ESOPHAGOGASTRODUODENOSCOPY (EGD) WITH PROPOFOL ;  Surgeon: Lupita FORBES Commander, MD;  Location: [facility];  Service: Endoscopy;  Laterality: N/A;   ESOPHAGOGASTRODUODENOSCOPY (EGD) WITH PROPOFOL  N/A 09/12/2015   Procedure: ESOPHAGOGASTRODUODENOSCOPY (EGD) WITH PROPOFOL ;  Surgeon: Lupita FORBES Commander, MD;  [facility]: [facility];  Service: Endoscopy;  Laterality: N/A;   ESOPHAGOGASTRODUODENOSCOPY (EGD) WITH PROPOFOL  N/A 09/21/2015   Procedure: ESOPHAGOGASTRODUODENOSCOPY (EGD) WITH PROPOFOL ;  Surgeon: Lupita FORBES Commander, MD;  [facility]: [facility];  Service: Endoscopy;  Laterality: N/A;   ESOPHAGOGASTRODUODENOSCOPY (EGD) WITH PROPOFOL  N/A 10/04/2015   Procedure: ESOPHAGOGASTRODUODENOSCOPY (EGD) WITH PROPOFOL ;  Surgeon: Toribio SHAUNNA Cedar, MD;  [facility]: [facility];  Service: Endoscopy;  Laterality: N/A;  possible stricture   ESOPHAGOGASTRODUODENOSCOPY (EGD) WITH PROPOFOL  N/A 12/10/2015   Procedure: ESOPHAGOGASTRODUODENOSCOPY (EGD) WITH  PROPOFOL ;  Surgeon: Lupita FORBES Commander, MD;  [facility]: [facility];  Service: Endoscopy;  Laterality: N/A;   ESOPHAGOGASTRODUODENOSCOPY (EGD) WITH PROPOFOL  N/A 02/06/2016   Procedure: ESOPHAGOGASTRODUODENOSCOPY (EGD) WITH PROPOFOL ;  Surgeon: Lupita FORBES Commander, MD;  [facility]: [facility];  Service: Endoscopy;  Laterality: N/A;   ESOPHAGOGASTRODUODENOSCOPY (EGD) WITH PROPOFOL  N/A 10/10/2016   Procedure: ESOPHAGOGASTRODUODENOSCOPY (EGD) WITH PROPOFOL ;  Surgeon: Lupita FORBES Commander, MD;  [facility]: [facility];  Service: Endoscopy;  Laterality: N/A;  needs APC    EUS N/A 06/14/2015   Procedure: ESOPHAGEAL ENDOSCOPIC ULTRASOUND (EUS) RADIAL;  Surgeon: Toribio SHAUNNA Cedar, MD;  Location: [facility];  Service: Endoscopy;  Laterality: N/A;   GASTRECTOMY     Subtotal in 2016   HOT HEMOSTASIS N/A 10/10/2016   Procedure: HOT HEMOSTASIS (ARGON PLASMA COAGULATION/BICAP);  Surgeon: Lupita FORBES Commander, MD;  Location: [facility];  Service: Endoscopy;  Laterality: N/A;   KNEE ARTHROSCOPY Right    Right Knee, [place] [facility]   LAPAROSCOPIC GASTRECTOMY N/A 07/18/2015   Procedure: SUB TOTAL GASTRECTOMY;  Surgeon: Jina Nephew, MD;  Location: [facility];  Service: General;  Laterality: N/A;   LAPAROSCOPIC GASTROSTOMY N/A 07/18/2015   Procedure: FEEDING TUBE PLACEMENT;  Surgeon: Jina Nephew, MD;  Location: [facility];  Service: General;  Laterality: N/A;   LAPAROSCOPY N/A 07/18/2015   Procedure: LAPAROSCOPY DIAGNOSTIC;  Surgeon: Jina Nephew, MD;  Location: [facility];  Service: General;  Laterality: N/A;   MELANOMA EXCISION  02/01/2016   abdominal wall   MOHS SURGERY  05/2020   nose   PROSTATE BIOPSY  09/06/2014   ROTATOR CUFF REPAIR Right    R shoulder, [place]   SAVORY DILATION N/A 02/06/2016   Procedure: SAVORY DILATION;  Surgeon: Lupita FORBES Commander, MD;  Location: [facility];  Service: Endoscopy;  Laterality: N/A;   TOTAL SHOULDER ARTHROPLASTY Left 05/07/2017   Procedure: LEFT REVERSE TOTAL SHOULDER ARTHROPLASTY;  Surgeon: Melita Drivers, MD;  Location: [facility];  Service: Orthopedics;  Laterality: Left;   TRIGGER FINGER RELEASE Left 1998   UPPER GASTROINTESTINAL ENDOSCOPY     x14   VASECTOMY       Current Outpatient Medications  Medication Sig Dispense Refill   pravastatin  (PRAVACHOL ) 40 MG tablet Take 1 tablet (40 mg total) by mouth daily. TAKE 1 TABLET BY MOUTH EVERYDAY AT BEDTIME Strength: 40 mg 90 tablet 1   acetaminophen  (TYLENOL ) 500 MG tablet Take 1,000 mg by mouth every 6 (six) hours as needed for mild pain.      Cholecalciferol (VITAMIN D3) 25 MCG (1000 UT) CAPS Take 1 capsule by mouth daily.     Cyanocobalamin  (VITAMIN B 12 PO) Take 1 capsule by mouth daily.     ferrous sulfate  325 (  65 FE) MG EC tablet Take 1 tablet (325 mg total) by mouth daily with breakfast. 30 tablet 3   Multiple Vitamin (MULTIVITAMIN WITH MINERALS) TABS tablet Take 1 tablet by mouth every other day.      omeprazole  (PRILOSEC) 40 MG capsule TAKE 1 CAPSULE DAILY 30 MINS BEFORE BREAKFAST OR SUPPER, OKAY TO OPEN AND SPRINKLE ON APPLESAUCE 90 capsule 1   Current Facility-Administered Medications  Medication Dose Route Frequency Provider Last Rate Last Admin   0.9 %  sodium chloride  infusion  500 mL Intravenous Once Avram Lupita BRAVO, MD        Allergies as of 06/23/2024 - Review Complete 06/23/2024  Allergen Reaction Noted   Codeine Nausea And Vomiting     Family History  Problem Relation Age of Onset   Diabetes Father    Heart attack Father 58   Liver disease Father        Related to Alcohol Use    Alcohol abuse Mother    Cirrhosis Mother        died in her 66s   Multiple myeloma Brother    Diabetes Sister    Colon cancer Neg Hx    Stomach cancer Neg Hx    Stroke Neg Hx    Esophageal cancer Neg Hx    Rectal cancer Neg Hx     Social History   Socioeconomic History   Marital status: Married    Spouse name: Donny   Number of children: 2   Years of education: Not on file   Highest education level: Bachelor's degree (e.g.,  BA, AB, BS)  Occupational History   Occupation: retired  Tobacco Use   Smoking status: Former    Current packs/day: 0.00    Average packs/day: 0.5 packs/day for 5.0 years (2.5 ttl pk-yrs)    Types: Cigarettes    Start date: 10/06/1962    Quit date: 10/07/1967    Years since quitting: 56.7   Smokeless tobacco: Never   Tobacco comments:    smoked 1963- 1969 , up to < 1 ppd  Vaping Use   Vaping status: Never Used  Substance and Sexual Activity   Alcohol use: Yes    Alcohol/week: 1.0 standard drink of alcohol    Types: 1 Cans of beer per week    Comment: maybe once a week beer or wine   Drug use: No   Sexual activity: Yes  Other Topics Concern   Not on file  Social History Narrative   Married and retired - 1 son and 1 daughter    Lives at [place] and active golfer, Armed forces operational officer.  Second [facility], [employer] condominium   Very occasional alcohol, former smoker   Social Drivers of Corporate investment banker Strain: Low Risk  (04/11/2024)   Overall Financial Resource Strain (CARDIA)    Difficulty of Paying Living Expenses: Not hard at all  Food Insecurity: No Food Insecurity (04/11/2024)   Hunger Vital Sign    Worried About Running Out of Food in the Last Year: Never true    Ran Out of Food in the Last Year: Never true  Transportation Needs: No Transportation Needs (04/11/2024)   PRAPARE - Administrator, Civil Service (Medical): No    Lack of Transportation (Non-Medical): No  Physical Activity: Sufficiently Active (04/11/2024)   Exercise Vital Sign    Days of Exercise per Week: 4 days    Minutes of Exercise per Session: 60 min  Stress:  No Stress Concern Present (04/11/2024)   [employer] of Occupational Health - Occupational Stress Questionnaire    Feeling of Stress: Not at all  Social Connections: Socially Integrated (04/11/2024)   Social Connection and Isolation Panel    Frequency of Communication with Friends and Family: More than three times  a week    Frequency of Social Gatherings with Friends and Family: More than three times a week    Attends Religious Services: More than 4 times per year    Active Member of [employer] or Organizations: Yes    Attends Engineer, structural: More than 4 times per year    Marital Status: Married  Catering manager Violence: Not At Risk (04/11/2024)   Humiliation, Afraid, Rape, and Kick questionnaire    Fear of Current or Ex-Partner: No    Emotionally Abused: No    Physically Abused: No    Sexually Abused: No    Review of Systems:  All other review of systems negative except as mentioned in the HPI.  Physical Exam: Vital signs BP 124/74   Pulse (!) 53   Temp 97.9 F (36.6 C) (Temporal)   Resp 16   Ht 5' 6 (1.676 m)   Wt 143 lb 9.6 oz (65.1 kg)   SpO2 97%   BMI 23.18 kg/m   General:   Alert,  Well-developed, well-nourished, pleasant and cooperative in NAD Lungs:  Clear throughout to auscultation.   Heart:  Regular rate and rhythm; no murmurs, clicks, rubs,  or gallops. Abdomen:  Soft, nontender and nondistended. Normal bowel sounds.   Neuro/Psych:  Alert and cooperative. Normal mood and affect. A and O x 3   @Lloyde Ludlam  CHARLENA Commander, MD, [facility] [phone] (pager) 06/23/2024 11:17 AM@

## 2024-06-23 ENCOUNTER — Ambulatory Visit: Admitting: Internal Medicine

## 2024-06-23 ENCOUNTER — Ambulatory Visit: Payer: Self-pay | Admitting: Internal Medicine

## 2024-06-23 ENCOUNTER — Other Ambulatory Visit (INDEPENDENT_AMBULATORY_CARE_PROVIDER_SITE_OTHER)

## 2024-06-23 ENCOUNTER — Encounter: Payer: Self-pay | Admitting: Internal Medicine

## 2024-06-23 VITALS — BP 120/74 | HR 59 | Temp 97.9°F | Resp 14 | Ht 66.0 in | Wt 143.6 lb

## 2024-06-23 DIAGNOSIS — D128 Benign neoplasm of rectum: Secondary | ICD-10-CM | POA: Diagnosis not present

## 2024-06-23 DIAGNOSIS — E785 Hyperlipidemia, unspecified: Secondary | ICD-10-CM | POA: Diagnosis not present

## 2024-06-23 DIAGNOSIS — D509 Iron deficiency anemia, unspecified: Secondary | ICD-10-CM

## 2024-06-23 DIAGNOSIS — K648 Other hemorrhoids: Secondary | ICD-10-CM | POA: Diagnosis not present

## 2024-06-23 LAB — CBC
HCT: 39.5 % (ref 39.0–52.0)
Hemoglobin: 13.2 g/dL (ref 13.0–17.0)
MCHC: 33.3 g/dL (ref 30.0–36.0)
MCV: 93 fl (ref 78.0–100.0)
Platelets: 193 K/uL (ref 150.0–400.0)
RBC: 4.25 Mil/uL (ref 4.22–5.81)
RDW: 17.1 % — ABNORMAL HIGH (ref 11.5–15.5)
WBC: 4.1 K/uL (ref 4.0–10.5)

## 2024-06-23 MED ORDER — SODIUM CHLORIDE 0.9 % IV SOLN: 500.0000 mL | Freq: Once | INTRAVENOUS | Status: DC

## 2024-06-23 NOTE — Progress Notes (Signed)
 Called to room to assist during endoscopic procedure.  Patient ID and intended procedure confirmed with present staff. Received instructions for my participation in the procedure from the performing physician.

## 2024-06-23 NOTE — Op Note (Signed)
 [facility] Patient Name: James [name] Procedure Date: 06/23/2024 11:06 AM MRN: [id number] Endoscopist: Lupita FORBES Commander , MD, [id number] Age: 78 Referring MD:  Date of Birth: Apr 01, 1946 Gender: Male Account #: [id number] Procedure:                Colonoscopy Indications:              Iron deficiency anemia Medicines:                Monitored Anesthesia Care Procedure:                Pre-Anesthesia Assessment:                           - Prior to the procedure, a History and Physical                            was performed, and patient medications and                            allergies were reviewed. The patient's tolerance of                            previous anesthesia was also reviewed. The risks                            and benefits of the procedure and the sedation                            options and risks were discussed with the patient.                            All questions were answered, and informed consent                            was obtained. Prior Anticoagulants: The patient has                            taken no anticoagulant or antiplatelet agents. ASA                            Grade Assessment: II - A patient with mild systemic                            disease. After reviewing the risks and benefits,                            the patient was deemed in satisfactory condition to                            undergo the procedure.                           After obtaining informed consent, the colonoscope  was passed under direct vision. Throughout the                            procedure, the patient's blood pressure, pulse, and                            oxygen saturations were monitored continuously. The                            Olympus CF-HQ190L ([id number]) Colonoscope was                            introduced through the anus and advanced to the the                            terminal ileum, with identification of  the                            appendiceal orifice and IC valve. The colonoscopy                            was performed without difficulty. The patient                            tolerated the procedure well. The quality of the                            bowel preparation was good. The terminal ileum,                            ileocecal valve, appendiceal orifice, and rectum                            were photographed. The bowel preparation used was                            SUPREP via split dose instruction. Scope In: 11:19:07 AM Scope Out: 11:39:12 AM Scope Withdrawal Time: 0 hours 16 minutes 45 seconds  Total Procedure Duration: 0 hours 20 minutes 5 seconds  Findings:                 The digital rectal exam was normal.                           A 10 mm polyp was found in the distal rectum. The                            polyp was sessile. The polyp was removed with a                            cold snare. Resection and retrieval were complete.                            Verification of patient identification  for the                            specimen was done. Estimated blood loss was                            minimal. Tip cautery applied to stope persistent                            ooze of blood.                           Internal hemorrhoids were found.                           The terminal ileum appeared normal.                           The exam was otherwise without abnormality on                            direct and retroflexion views. Complications:            No immediate complications. Estimated Blood Loss:     Estimated blood loss was minimal. Impression:               - One 10 mm polyp in the distal rectum, removed                            with a cold snare. Resected and retrieved.                           - Internal hemorrhoids.                           - The examined portion of the ileum was normal.                           - The examination was otherwise  normal on direct                            and retroflexion views. Recommendation:           - Patient has a contact number available for                            emergencies. The signs and symptoms of potential                            delayed complications were discussed with the                            patient. Return to normal activities tomorrow.                            Written discharge instructions were provided to the  patient.                           - Resume previous diet.                           - Continue present medications.                           - Await pathology results.                           - No repeat colonoscopy due to age.                           - Continue iron supplement                           - Recheck CBC today Lupita FORBES Commander, MD 06/23/2024 11:50:20 AM This report has been signed electronically.

## 2024-06-23 NOTE — Progress Notes (Signed)
 Report to PACU, RN, vss, BBS= Clear.

## 2024-06-23 NOTE — Patient Instructions (Addendum)
 I found and removed one rectal polyp that looks benign.  I will have it analyzed. No need for routine repeat colonoscopy.  Continue iron supplements and follow-up with Drs. Sherrill and [employer].  I appreciate the opportunity to care for you. [name], MD, FACG   YOU HAD AN ENDOSCOPIC PROCEDURE TODAY AT THE  ENDOSCOPY CENTER:   Refer to the procedure report that was given to you for any specific questions about what was found during the examination.  If the procedure report does not answer your questions, please call your gastroenterologist to clarify.  If you requested that your care partner not be given the details of your procedure findings, then the procedure report has been included in a sealed envelope for you to review at your convenience later.  YOU SHOULD EXPECT: Some feelings of bloating in the abdomen. Passage of more gas than usual.  Walking can help get rid of the air that was put into your GI tract during the procedure and reduce the bloating. If you had a lower endoscopy (such as a colonoscopy or flexible sigmoidoscopy) you may notice spotting of blood in your stool or on the toilet paper. If you underwent a bowel prep for your procedure, you may not have a normal bowel movement for a few days.  Please Note:  You might notice some irritation and congestion in your nose or some drainage.  This is from the oxygen used during your procedure.  There is no need for concern and it should clear up in a day or so.  SYMPTOMS TO REPORT IMMEDIATELY:  Following lower endoscopy (colonoscopy or flexible sigmoidoscopy):  Excessive amounts of blood in the stool  Significant tenderness or worsening of abdominal pains  Swelling of the abdomen that is new, acute  Fever of 100F or higher  For urgent or emergent issues, a gastroenterologist can be reached at any hour by calling [phone]. Do not use MyChart messaging for urgent concerns.    DIET:  We do recommend a small meal  at first, but then you may proceed to your regular diet.  Drink plenty of fluids but you should avoid alcoholic beverages for 24 hours.  ACTIVITY:  You should plan to take it easy for the rest of today and you should NOT DRIVE or use heavy machinery until tomorrow (because of the sedation medicines used during the test).    FOLLOW UP: Our staff will call the number listed on your records the next business day following your procedure.  We will call around 7:15- 8:00 am to check on you and address any questions or concerns that you may have regarding the information given to you following your procedure. If we do not reach you, we will leave a message.     If any biopsies were taken you will be contacted by phone or by letter within the next 1-3 weeks.  Please call us  at [phone] if you have not heard about the biopsies in 3 weeks.    SIGNATURES/CONFIDENTIALITY: You and/or your care partner have signed paperwork which will be entered into your electronic medical record.  These signatures attest to the fact that that the information above on your After Visit Summary has been reviewed and is understood.  Full responsibility of the confidentiality of this discharge information lies with you and/or your care-partner.

## 2024-06-23 NOTE — Progress Notes (Signed)
 Pt's states no medical or surgical changes since previsit or office visit.

## 2024-06-24 ENCOUNTER — Telehealth: Payer: Self-pay

## 2024-06-24 NOTE — Telephone Encounter (Signed)
  Follow up Call-     06/23/2024   10:35 AM 05/09/2024   12:50 PM 07/16/2023    9:51 AM 07/24/2022    9:43 AM  Call back number  Post procedure Call Back phone  # [phone]  Permission to leave phone message Yes Yes Yes Yes     Patient questions:  Do you have a fever, pain , or abdominal swelling? No. Pain Score  0 *  Have you tolerated food without any problems? Yes.    Have you been able to return to your normal activities? Yes.    Do you have any questions about your discharge instructions: Diet   No. Medications  No. Follow up visit  No.  Do you have questions or concerns about your Care? No.  Actions: * If pain score is 4 or above: No action needed, pain <4.

## 2024-06-27 LAB — SURGICAL PATHOLOGY

## 2024-07-07 ENCOUNTER — Inpatient Hospital Stay: Attending: Oncology

## 2024-07-07 ENCOUNTER — Inpatient Hospital Stay

## 2024-07-07 ENCOUNTER — Inpatient Hospital Stay: Admitting: Oncology

## 2024-07-07 VITALS — BP 122/78 | HR 69 | Temp 97.8°F | Resp 18 | Ht 66.0 in | Wt 143.6 lb

## 2024-07-07 DIAGNOSIS — R6881 Early satiety: Secondary | ICD-10-CM | POA: Diagnosis not present

## 2024-07-07 DIAGNOSIS — D6959 Other secondary thrombocytopenia: Secondary | ICD-10-CM | POA: Insufficient documentation

## 2024-07-07 DIAGNOSIS — Z923 Personal history of irradiation: Secondary | ICD-10-CM | POA: Diagnosis not present

## 2024-07-07 DIAGNOSIS — C161 Malignant neoplasm of fundus of stomach: Secondary | ICD-10-CM | POA: Insufficient documentation

## 2024-07-07 DIAGNOSIS — Z9221 Personal history of antineoplastic chemotherapy: Secondary | ICD-10-CM | POA: Diagnosis not present

## 2024-07-07 DIAGNOSIS — E538 Deficiency of other specified B group vitamins: Secondary | ICD-10-CM

## 2024-07-07 DIAGNOSIS — D509 Iron deficiency anemia, unspecified: Secondary | ICD-10-CM | POA: Diagnosis not present

## 2024-07-07 DIAGNOSIS — D709 Neutropenia, unspecified: Secondary | ICD-10-CM | POA: Diagnosis not present

## 2024-07-07 DIAGNOSIS — Z85820 Personal history of malignant melanoma of skin: Secondary | ICD-10-CM | POA: Insufficient documentation

## 2024-07-07 DIAGNOSIS — Z8546 Personal history of malignant neoplasm of prostate: Secondary | ICD-10-CM | POA: Insufficient documentation

## 2024-07-07 DIAGNOSIS — Z23 Encounter for immunization: Secondary | ICD-10-CM | POA: Insufficient documentation

## 2024-07-07 LAB — CBC WITH DIFFERENTIAL (CANCER CENTER ONLY)
Abs Immature Granulocytes: 0.01 K/uL (ref 0.00–0.07)
Basophils Absolute: 0 K/uL (ref 0.0–0.1)
Basophils Relative: 1 %
Eosinophils Absolute: 0.3 K/uL (ref 0.0–0.5)
Eosinophils Relative: 4 %
HCT: 41.1 % (ref 39.0–52.0)
Hemoglobin: 13.7 g/dL (ref 13.0–17.0)
Immature Granulocytes: 0 %
Lymphocytes Relative: 36 %
Lymphs Abs: 2.1 K/uL (ref 0.7–4.0)
MCH: 31.1 pg (ref 26.0–34.0)
MCHC: 33.3 g/dL (ref 30.0–36.0)
MCV: 93.2 fL (ref 80.0–100.0)
Monocytes Absolute: 0.7 K/uL (ref 0.1–1.0)
Monocytes Relative: 12 %
Neutro Abs: 2.7 K/uL (ref 1.7–7.7)
Neutrophils Relative %: 47 %
Platelet Count: 188 K/uL (ref 150–400)
RBC: 4.41 MIL/uL (ref 4.22–5.81)
RDW: 16 % — ABNORMAL HIGH (ref 11.5–15.5)
WBC Count: 5.7 K/uL (ref 4.0–10.5)
nRBC: 0 % (ref 0.0–0.2)

## 2024-07-07 MED ORDER — INFLUENZA VAC SPLIT HIGH-DOSE 0.5 ML IM SUSY
0.5000 mL | PREFILLED_SYRINGE | Freq: Once | INTRAMUSCULAR | Status: AC
  Administered 2024-07-07: 0.5 mL via INTRAMUSCULAR
  Filled 2024-07-07: qty 0.5

## 2024-07-07 NOTE — Progress Notes (Signed)
 [facility] OFFICE PROGRESS NOTE   Diagnosis: Gastric cancer  INTERVAL HISTORY:   [name] returns as scheduled.  He continues to have early satiety and intermittent dysphagia.  He underwent a anoscopy 06/23/2024.  A polyp was removed from the rectum.  He underwent an upper endoscopy 05/09/2024 for dilation of a benign-appearing stricture at the gastroesophageal anastomosis.  A biopsy at the GE junction revealed Barrett's esophagus. [name] wife is with him today.  She is concerned he has been unable to gain weight.  She reports he has intermittent confusion.  No focal neurologic symptoms. Objective:  Vital signs in last 24 hours:  Blood pressure 122/78, pulse 69, temperature 97.8 F (36.6 C), temperature source Temporal, resp. rate 18, height 5' 6 (1.676 m), weight 143 lb 9.6 oz (65.1 kg), SpO2 99%.   Lymphatics: No cervical, supraclavicular, axillary, or inguinal nodes Resp: Scattered coarse end inspiratory rhonchi at the posterior chest bilaterally, no respiratory distress Cardio: Regular rate and rhythm GI: No hepatosplenomegaly, no mass, tender in the subxiphoid region Vascular: No leg edema   Lab Results:  Lab Results  Component Value Date   WBC 5.7 07/07/2024   HGB 13.7 07/07/2024   HCT 41.1 07/07/2024   MCV 93.2 07/07/2024   PLT 188 07/07/2024   NEUTROABS 2.7 07/07/2024    CMP  Lab Results  Component Value Date   NA 139 08/12/2023   K 4.2 08/12/2023   CL 103 08/12/2023   CO2 28 08/12/2023   GLUCOSE 149 (H) 08/12/2023   BUN 17 08/12/2023   CREATININE 0.98 08/12/2023   CALCIUM  9.3 08/12/2023   PROT 7.0 08/12/2023   ALBUMIN 4.1 08/12/2023   AST 19 08/12/2023   ALT 9 08/12/2023   ALKPHOS 61 08/12/2023   BILITOT 0.5 08/12/2023   GFRNONAA 83 04/18/2020   GFRAA 96 04/18/2020    No results found for: CEA1, CEA, CAN199, CA125  Lab Results  Component Value Date   INR 1.21 07/19/2015   LABPROT 15.5 (H) 07/19/2015     Imaging:  No results found.  Medications: I have reviewed the patient's current medications.   Assessment/Plan: Gastric cancer-adenocarcinoma of the gastric fundus/cardia Staging CT scans 06/07/2015 with no evidence of distant metastatic disease, tiny nonspecific pulmonary nodules and liver lesions-likely benign Subtotal gastrectomy and placement of a jejunostomy feeding tube 07/18/2015 for a T2,N0 tumor with negative surgical margins Initiation of adjuvant 5-FU/leucovorin  08/28/2015, status post 3 weekly treatments completed 09/13/2015 Initiation of radiation and concurrent Xeloda  10/29/2015 11/26/2015 Xeloda  dose reduced to 1000 mg twice daily due to progressive thrombocytopenia Radiation completed 12/06/2015 5-FU/leucovorin  resumed 12/21/2015       History of microcytic anemia-likely iron deficiency anemia secondary to #1 Progressive anemia 09/16/2016-secondary to 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 Upper endoscopy 10/02/2016 with findings of multiple small bleeding angioectasias status post argon plasma coagulation on 10/10/2016   3.    Vitamin B-12 deficiency-Low-level confirmed 01/04/2016 and again 07/09/2018, maintained on vitamin B12 injection therapy  4.   T1c prostate cancer-status post external beam radiation completed April 2016   5.    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 Avram 11/01/2015 and 12/10/2015; EGD 10/02/2016 with finding of esophageal stenosis at the gastroesophageal junction status post dilation 10/10/2016, 01/21/2018, 06/20/2019 Benign stenosis at the esophageal anastomosis dilated 02/26/2021 05/09/2024 endoscopy: Benign-appearing stenosis at the gastroesophageal  anastomosis dilated, biopsy-Barrett's esophagus, erythematous friable inflamed gastric  mucosa-chronic gastritis with intestinal metaplasia   7.    Mild neutropenia/thrombocytopenia secondary to chemotherapy. Progressive thrombocytopenia 11/26/2015. Xeloda  dose reduced to 1000 mg twice daily. Counts stable to improved 01/04/2016.    8.    Erythema/tenderness at the jejunostomy feeding tube site 09/20/2015, improved   9.    Feeding tube removed 01/04/2016   10.  Melanoma left lower abdominal wall status post  wide excision.  11.  Iron deficiency 05/09/2024       Disposition: Mr James [name] is in clinical remission from gastric cancer.  He has chronic early satiety and dysphagia secondary to the partial gastrectomy and gastroesophageal anastomotic stenosis.  He will continue follow-up with Dr. Avram.  He was found to have iron deficiency in August.  The iron deficiency is likely related to gastritis.  He has undergone an upper and lower endoscopic evaluation over the past few months.  He will continue iron therapy.  He remains on vitamin B12 replacement.  [name] has a stable weight compared to when I saw him last year.  I encouraged him to continue frequent meals and try nutrition supplements.  He received an influenza vaccine today.  James Hof, MD  07/07/2024  10:11 AM

## 2024-07-08 ENCOUNTER — Other Ambulatory Visit: Payer: Self-pay

## 2024-07-08 ENCOUNTER — Ambulatory Visit: Payer: Medicare Other | Admitting: Oncology

## 2024-07-08 ENCOUNTER — Other Ambulatory Visit: Payer: Medicare Other

## 2024-07-11 ENCOUNTER — Ambulatory Visit

## 2024-07-11 DIAGNOSIS — E538 Deficiency of other specified B group vitamins: Secondary | ICD-10-CM | POA: Diagnosis not present

## 2024-07-11 MED ORDER — CYANOCOBALAMIN 1000 MCG/ML IJ SOLN
1000.0000 ug | Freq: Once | INTRAMUSCULAR | Status: AC
  Administered 2024-07-11: 1000 ug via INTRAMUSCULAR

## 2024-07-11 NOTE — Progress Notes (Signed)
 After obtaining consent, and per orders of Dr. Lawerance Bach, injection of B12 given by Ferdie Ping. Patient instructed to report any adverse reaction to me immediately.

## 2024-07-22 ENCOUNTER — Other Ambulatory Visit: Payer: Self-pay

## 2024-07-22 ENCOUNTER — Ambulatory Visit
Admission: EM | Admit: 2024-07-22 | Discharge: 2024-07-22 | Disposition: A | Discharge status: Home / Self Care | Attending: Emergency Medicine | Admitting: Emergency Medicine

## 2024-07-22 DIAGNOSIS — R0981 Nasal congestion: Secondary | ICD-10-CM | POA: Diagnosis not present

## 2024-07-22 DIAGNOSIS — R519 Headache, unspecified: Secondary | ICD-10-CM | POA: Diagnosis not present

## 2024-07-22 MED ORDER — FLUTICASONE PROPIONATE 50 MCG/ACT NA SUSP: 2.0000 | Freq: Two times a day (BID) | NASAL | 0 refills | Status: AC

## 2024-07-22 MED ORDER — PREDNISONE 10 MG PO TABS: 30.0000 mg | ORAL_TABLET | Freq: Every day | ORAL | 0 refills | Status: AC

## 2024-07-22 NOTE — ED Triage Notes (Signed)
 Pt presents with complaints of headaches, sinus pressure, and fatigue x 2-3 days. Currently rates head pain a 7/10. OTC sinus & cold medication taken with minimal relief/improvement. Denies fevers/sick contacts.

## 2024-07-22 NOTE — Discharge Instructions (Signed)
 Take the steroids daily with breakfast to help with your sinus pain and pressure.  Also use the Flonase  nasal spray.  Ensure you are drinking at least 64 ounces water  daily to help loosen secretions, sleep with a humidifier at night may help as well.  This appears to be viral, typically viral illnesses last for 5 to 7 days.  If you develop any new concerning symptoms or prolonged symptoms please follow-up with your primary care provider or return to clinic for reevaluation.

## 2024-07-22 NOTE — ED Provider Notes (Signed)
 [facility]    CSN: [id number] Arrival date & time: 07/22/24  0955      History   Chief Complaint Chief Complaint  Patient presents with   Headache   Sinus Pressure     HPI ZEBULIN SIEGEL is a 78 y.o. male.   Patient presents to clinic over concern of sinus pressure, nasal congestion, rhinorrhea, fatigue and sinus headache for the past 2 or 3 days.  Nasal drainage has been clear.  Denies sore throat, body aches, chills, fever or cough.  Has tried over-the-counter sinus medications without much relief.  Denies recent sick contacts.  Denies ear pain.  The history is provided by the patient and medical records.  Headache   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   Gastrostomy tube in place [facility]) 2016   intermittent feeding   Hx of basal cell carcinoma    Hyperlipemia    no per pt 04-11-16   Melanoma (HCC)    Prostate cancer (HCC) 09/06/2014   last radiation 4'16    S/P radiation therapy 11/29/2014 through 01/23/2015                                                     Prostate 7800 cGy in 40 sessions, seminal vesicles 5600 cGy in 40 sessions                        Skin cancer, basal cell    Transfusion history    '72 -s/p surgery for duodenal ulcer, 12/17    Patient Active Problem List   Diagnosis Date Noted   Loss of weight 05/04/2024   Lower urinary tract symptoms 05/06/2023    Prediabetes 05/06/2023   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   Bilateral carotid bruits 04/18/2020   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   Fatigue 04/18/2020   GERD (gastroesophageal reflux disease) 02/02/2020   Acute sinus infection 08/20/2018   Left breast lump 07/15/2018   S/p reverse total shoulder arthroplasty 05/07/2017   Gastric AVM    Thrombocytopenia 05/24/2016   Malignant melanoma (HCC) 01/08/2016   Esophageal dysphagia    Anastomotic stricture of stomach    Esophageal stricture    Carcinoma of lesser curvature of stomach (HCC) 07/18/2015   Gastric cancer (HCC) 06/08/2015   B12 deficiency 06/05/2015   Prostate cancer (HCC) 10/24/2014   Vitamin D  deficiency 06/03/2014   Malignant basal cell neoplasm of skin 09/17/2012   HEARING DEFICIT 12/21/2009   NONSPECIFIC ABNORMAL ELECTROCARDIOGRAM 12/21/2009   History of colonic polyps  12/04/2008   HYPERLIPIDEMIA 08/23/2007    Past Surgical History:  Procedure Laterality Date   BALLOON DILATION N/A 09/12/2015   Procedure: BALLOON DILATION;  Surgeon: Lupita FORBES Commander, MD;  Location: [facility];  Service: Endoscopy;  Laterality: N/A;   BALLOON DILATION N/A 09/21/2015   Procedure: BALLOON DILATION;  Surgeon: Lupita FORBES Commander, MD;  Location: [facility];  Service: Endoscopy;  Laterality: N/A;   BALLOON DILATION N/A 10/04/2015   Procedure: BALLOON DILATION;  Surgeon: Toribio SHAUNNA Cedar, MD;  Location: [facility];  Service: Endoscopy;  Laterality: N/A;   BALLOON DILATION N/A 12/10/2015   Procedure: BALLOON DILATION;  Surgeon: Lupita FORBES Commander, MD;  Location: [facility];  Service: Endoscopy;  Laterality: N/A;   BALLOON DILATION N/A 10/10/2016   Procedure: BALLOON DILATION;  Surgeon: Lupita FORBES Commander, MD;  Location: [facility];  Service: Endoscopy;  Laterality: N/A;   CATARACT EXTRACTION, BILATERAL Bilateral    COLONOSCOPY  2018   colonoscopy with polypectomy  02/2012   Dr Jakie    ESOPHAGOGASTRODUODENOSCOPY (EGD) WITH PROPOFOL  N/A 08/17/2015   Procedure: ESOPHAGOGASTRODUODENOSCOPY (EGD) WITH PROPOFOL ;  Surgeon: Lupita FORBES Commander, MD;  Location: [facility] ENDOSCOPY;  Service: Endoscopy;  Laterality: N/A;   ESOPHAGOGASTRODUODENOSCOPY (EGD) WITH PROPOFOL  N/A 09/12/2015   Procedure: ESOPHAGOGASTRODUODENOSCOPY (EGD) WITH PROPOFOL ;  Surgeon: Lupita FORBES Commander, MD;  Location: [facility];  Service: Endoscopy;  Laterality: N/A;   ESOPHAGOGASTRODUODENOSCOPY (EGD) WITH PROPOFOL  N/A 09/21/2015   Procedure: ESOPHAGOGASTRODUODENOSCOPY (EGD) WITH PROPOFOL ;  Surgeon: Lupita FORBES Commander, MD;  Location: [facility];  Service: Endoscopy;  Laterality: N/A;   ESOPHAGOGASTRODUODENOSCOPY (EGD) WITH PROPOFOL  N/A 10/04/2015   Procedure: ESOPHAGOGASTRODUODENOSCOPY (EGD) WITH PROPOFOL ;  Surgeon: Toribio SHAUNNA Cedar, MD;  Location: [facility];  Service: Endoscopy;  Laterality: N/A;  possible stricture   ESOPHAGOGASTRODUODENOSCOPY (EGD) WITH PROPOFOL  N/A 12/10/2015   Procedure: ESOPHAGOGASTRODUODENOSCOPY (EGD) WITH PROPOFOL ;  Surgeon: Lupita FORBES Commander, MD;  Location: [facility];  Service: Endoscopy;  Laterality: N/A;   ESOPHAGOGASTRODUODENOSCOPY (EGD) WITH PROPOFOL  N/A 02/06/2016   Procedure: ESOPHAGOGASTRODUODENOSCOPY (EGD) WITH PROPOFOL ;  Surgeon: Lupita FORBES Commander, MD;  Location: [facility];  Service: Endoscopy;  Laterality: N/A;   ESOPHAGOGASTRODUODENOSCOPY (EGD) WITH PROPOFOL  N/A 10/10/2016   Procedure: ESOPHAGOGASTRODUODENOSCOPY (EGD) WITH PROPOFOL ;  Surgeon: Lupita FORBES Commander, MD;  Location: [facility];  Service: Endoscopy;  Laterality: N/A;  needs APC    EUS N/A 06/14/2015   Procedure: ESOPHAGEAL ENDOSCOPIC ULTRASOUND (EUS) RADIAL;  Surgeon: Toribio SHAUNNA Cedar, MD;  Location: [facility];  Service: Endoscopy;  Laterality: N/A;   GASTRECTOMY     Subtotal in 2016   HOT HEMOSTASIS N/A 10/10/2016   Procedure: HOT HEMOSTASIS (ARGON PLASMA COAGULATION/BICAP);  Surgeon: Lupita FORBES Commander, MD;  Location: [facility];   Service: Endoscopy;  Laterality: N/A;   KNEE ARTHROSCOPY Right    Right Knee, [place] [facility]   LAPAROSCOPIC GASTRECTOMY N/A 07/18/2015   Procedure: SUB TOTAL GASTRECTOMY;  Surgeon: Jina Nephew, MD;  Location: [facility];  Service: General;  Laterality: N/A;   LAPAROSCOPIC GASTROSTOMY N/A 07/18/2015   Procedure: FEEDING TUBE PLACEMENT;  Surgeon: Jina Nephew, MD;  Location: [facility];  Service: General;  Laterality: N/A;   LAPAROSCOPY N/A 07/18/2015   Procedure: LAPAROSCOPY DIAGNOSTIC;  Surgeon: Jina Nephew, MD;  Location: [facility];  Service: General;  Laterality: N/A;   MELANOMA EXCISION  02/01/2016   abdominal wall   MOHS SURGERY  05/2020   nose   PROSTATE BIOPSY  09/06/2014   ROTATOR CUFF REPAIR Right    R shoulder, [place]   SAVORY  DILATION N/A 02/06/2016   Procedure: SAVORY DILATION;  Surgeon: Lupita FORBES Commander, MD;  Location: [facility];  Service: Endoscopy;  Laterality: N/A;   TOTAL SHOULDER ARTHROPLASTY Left 05/07/2017   Procedure: LEFT REVERSE TOTAL SHOULDER ARTHROPLASTY;  Surgeon: Melita Drivers, MD;  Location: [facility];  Service: Orthopedics;  Laterality: Left;   TRIGGER FINGER RELEASE Left 1998   UPPER GASTROINTESTINAL ENDOSCOPY     x14   VASECTOMY         Home Medications    Prior to Admission medications   Medication Sig Start Date End Date Taking? Authorizing Provider  fluticasone  (FLONASE ) 50 MCG/ACT nasal spray Place 2 sprays into both nostrils 2 (two) times daily for 5 days. 07/22/24 07/27/24 Yes Buren Havey  N, FNP  predniSONE  (DELTASONE ) 10 MG tablet Take 3 tablets (30 mg total) by mouth daily with breakfast for 5 days. 07/22/24 07/27/24 Yes Joshual Terrio  N, FNP  acetaminophen  (TYLENOL ) 500 MG tablet Take 1,000 mg by mouth every 6 (six) hours as needed for mild pain.     [provider]  Cholecalciferol (VITAMIN D3) 25 MCG (1000 UT) CAPS Take 1 capsule by mouth daily.    [provider]  Cyanocobalamin  (VITAMIN B 12 PO) Take 1 capsule by mouth daily.     [provider]  ferrous sulfate  325 (65 FE) MG EC tablet Take 1 tablet (325 mg total) by mouth daily with breakfast. 05/10/24   Commander Lupita FORBES, MD  Multiple Vitamin (MULTIVITAMIN WITH MINERALS) TABS tablet Take 1 tablet by mouth every other day.  Patient taking differently: Take 1 tablet by mouth daily.    [provider]  omeprazole  (PRILOSEC) 40 MG capsule TAKE 1 CAPSULE DAILY 30 MINS BEFORE BREAKFAST OR SUPPER, OKAY TO OPEN AND SPRINKLE ON APPLESAUCE 04/11/24   Burns, Glade PARAS, MD  pravastatin  (PRAVACHOL ) 40 MG tablet Take 1 tablet (40 mg total) by mouth daily. TAKE 1 TABLET BY MOUTH EVERYDAY AT BEDTIME Strength: 40 mg 04/07/23   Geofm Glade PARAS, MD    Family History Family History  Problem Relation Age of Onset   Diabetes Father    Heart attack Father 76   Liver disease Father        Related to Alcohol Use    Alcohol abuse Mother    Cirrhosis Mother        died in her 32s   Multiple myeloma Brother    Diabetes Sister    Colon cancer Neg Hx    Stomach cancer Neg Hx    Stroke Neg Hx    Esophageal cancer Neg Hx    Rectal cancer Neg Hx     Social History Social History   Tobacco Use   Smoking status: Former    Current packs/day: 0.00    Average packs/day: 0.5 packs/day for 5.0 years (2.5 ttl pk-yrs)    Types: Cigarettes    Start date: 10/06/1962    Quit date: 10/07/1967    Years since quitting: 56.8   Smokeless tobacco: Never   Tobacco comments:    smoked 1963- 1969 , up to < 1 ppd  Vaping Use   Vaping status: Never Used  Substance Use Topics   Alcohol use: Yes    Alcohol/week: 1.0 standard drink of alcohol    Types: 1 Cans of beer per week    Comment: maybe once a week beer or wine   Drug use: No     Allergies   Codeine   Review of Systems Review of  Systems  Per HPI  Physical Exam Triage Vital Signs ED Triage Vitals  Encounter Vitals Group     BP 07/22/24 1012 121/72     Girls Systolic BP Percentile --      Girls Diastolic BP Percentile  --      Boys Systolic BP Percentile --      Boys Diastolic BP Percentile --      Pulse Rate 07/22/24 1012 60     Resp 07/22/24 1012 17     Temp 07/22/24 1012 97.7 F (36.5 C)     Temp Source 07/22/24 1012 Oral     SpO2 07/22/24 1012 95 %     Weight 07/22/24 1011 144 lb (65.3 kg)     Height 07/22/24 1011 5' 6 (1.676 m)     Head Circumference --      Peak Flow --      Pain Score 07/22/24 1011 7     Pain Loc --      Pain Education --      Exclude from Growth Chart --    No data found.  Updated Vital Signs BP 121/72 (BP Location: Right Arm)   Pulse 60   Temp 97.7 F (36.5 C) (Oral)   Resp 17   Ht 5' 6 (1.676 m)   Wt 144 lb (65.3 kg)   SpO2 95%   BMI 23.24 kg/m   Visual Acuity Right Eye Distance:   Left Eye Distance:   Bilateral Distance:    Right Eye Near:   Left Eye Near:    Bilateral Near:     Physical Exam Vitals and nursing note reviewed.  Constitutional:      Appearance: Normal appearance. He is well-developed.  HENT:     Head: Normocephalic and atraumatic.     Right Ear: External ear normal.     Left Ear: External ear normal.     Nose: Congestion present.     Mouth/Throat:     Mouth: Mucous membranes are moist.     Pharynx: Posterior oropharyngeal erythema present.  Eyes:     Conjunctiva/sclera: Conjunctivae normal.  Cardiovascular:     Rate and Rhythm: Normal rate and regular rhythm.     Heart sounds: Normal heart sounds. No murmur heard. Pulmonary:     Effort: Pulmonary effort is normal. No respiratory distress.     Breath sounds: Normal breath sounds.  Skin:    General: Skin is warm and dry.  Neurological:     General: No focal deficit present.     Mental Status: He is alert and oriented to person, place, and time.     GCS: GCS eye subscore is 4. GCS verbal subscore is 5. GCS motor subscore is 6.  Psychiatric:        Mood and Affect: Mood normal.        Behavior: Behavior normal.      UC Treatments / Results  Labs (all labs ordered  are listed, but only abnormal results are displayed) Labs Reviewed - No data to display  EKG   Radiology No results found.  Procedures Procedures (including critical care time)  Medications Ordered in UC Medications - No data to display  Initial Impression / Assessment and Plan / UC Course  I have reviewed the triage vital signs and the nursing notes.  Pertinent labs & imaging results that were available during my care of the patient were reviewed by me and considered in my medical decision making (see chart for details).  Vitals and triage reviewed, patient is hemodynamically stable.  Lungs vesicular, heart with regular rate and rhythm.  Is having congestion, postnasal drip and posterior pharynx erythema.  Sinuses without tenderness to palpation.  Reports some pressure behind the eyes and sinus headache.  Symptoms consistent with viral illness, symptomatic management discussed.  Will trial short course of steroids to help relieve pressure.  Plan of care, follow-up care return precautions given, no questions at this time.     Final Clinical Impressions(s) / UC Diagnoses   Final diagnoses:  Sinus headache  Nasal congestion     Discharge Instructions      Take the steroids daily with breakfast to help with your sinus pain and pressure.  Also use the Flonase  nasal spray.  Ensure you are drinking at least 64 ounces water  daily to help loosen secretions, sleep with a humidifier at night may help as well.  This appears to be viral, typically viral illnesses last for 5 to 7 days.  If you develop any new concerning symptoms or prolonged symptoms please follow-up with your primary care provider or return to clinic for reevaluation.    ED Prescriptions     Medication Sig Dispense Auth. Provider   predniSONE  (DELTASONE ) 10 MG tablet Take 3 tablets (30 mg total) by mouth daily with breakfast for 5 days. 15 tablet Dreama, [place] Souter  N, FNP   fluticasone  (FLONASE ) 50 MCG/ACT nasal  spray Place 2 sprays into both nostrils 2 (two) times daily for 5 days. 9.9 mL Dreama, [place]  N, FNP      PDMP not reviewed this encounter.   Dreama, Jamicheal Heard  N, FNP 07/22/24 1044

## 2024-07-26 ENCOUNTER — Other Ambulatory Visit: Payer: Self-pay | Admitting: Internal Medicine

## 2024-07-26 DIAGNOSIS — E782 Mixed hyperlipidemia: Secondary | ICD-10-CM

## 2024-08-09 DIAGNOSIS — Z85820 Personal history of malignant melanoma of skin: Secondary | ICD-10-CM | POA: Diagnosis not present

## 2024-08-09 DIAGNOSIS — Z85828 Personal history of other malignant neoplasm of skin: Secondary | ICD-10-CM | POA: Diagnosis not present

## 2024-08-09 DIAGNOSIS — C44311 Basal cell carcinoma of skin of nose: Secondary | ICD-10-CM | POA: Diagnosis not present

## 2024-08-10 DIAGNOSIS — Z8546 Personal history of malignant neoplasm of prostate: Secondary | ICD-10-CM | POA: Diagnosis not present

## 2024-08-11 ENCOUNTER — Ambulatory Visit (INDEPENDENT_AMBULATORY_CARE_PROVIDER_SITE_OTHER)

## 2024-08-11 DIAGNOSIS — E538 Deficiency of other specified B group vitamins: Secondary | ICD-10-CM | POA: Diagnosis not present

## 2024-08-11 MED ORDER — CYANOCOBALAMIN 1000 MCG/ML IJ SOLN
1000.0000 ug | Freq: Once | INTRAMUSCULAR | Status: AC
  Administered 2024-08-11: 1000 ug via INTRAMUSCULAR

## 2024-08-11 NOTE — Progress Notes (Signed)
 After obtaining consent, and per orders of Dr. Geofm, injection of B12 given by Edsel CHRISTELLA Kerns. Patient tolerated well.

## 2024-08-17 DIAGNOSIS — R3912 Poor urinary stream: Secondary | ICD-10-CM | POA: Diagnosis not present

## 2024-08-17 DIAGNOSIS — N400 Enlarged prostate without lower urinary tract symptoms: Secondary | ICD-10-CM | POA: Diagnosis not present

## 2024-08-17 DIAGNOSIS — N5201 Erectile dysfunction due to arterial insufficiency: Secondary | ICD-10-CM | POA: Diagnosis not present

## 2024-08-17 DIAGNOSIS — C61 Malignant neoplasm of prostate: Secondary | ICD-10-CM | POA: Diagnosis not present

## 2024-08-17 DIAGNOSIS — R351 Nocturia: Secondary | ICD-10-CM | POA: Diagnosis not present

## 2024-08-17 DIAGNOSIS — R350 Frequency of micturition: Secondary | ICD-10-CM | POA: Diagnosis not present

## 2024-08-17 DIAGNOSIS — N401 Enlarged prostate with lower urinary tract symptoms: Secondary | ICD-10-CM | POA: Diagnosis not present

## 2024-08-17 DIAGNOSIS — R3914 Feeling of incomplete bladder emptying: Secondary | ICD-10-CM | POA: Diagnosis not present

## 2024-08-30 ENCOUNTER — Other Ambulatory Visit: Payer: Self-pay | Admitting: Internal Medicine

## 2024-09-12 ENCOUNTER — Ambulatory Visit (INDEPENDENT_AMBULATORY_CARE_PROVIDER_SITE_OTHER)

## 2024-09-12 DIAGNOSIS — E538 Deficiency of other specified B group vitamins: Secondary | ICD-10-CM

## 2024-09-12 MED ORDER — CYANOCOBALAMIN 1000 MCG/ML IJ SOLN
1000.0000 ug | Freq: Once | INTRAMUSCULAR | Status: AC
  Administered 2024-09-12: 1000 ug via INTRAMUSCULAR

## 2024-09-12 NOTE — Progress Notes (Signed)
 Pt was given B12 injec w/o any complications at this time.

## 2024-10-13 ENCOUNTER — Ambulatory Visit (INDEPENDENT_AMBULATORY_CARE_PROVIDER_SITE_OTHER)

## 2024-10-13 DIAGNOSIS — E538 Deficiency of other specified B group vitamins: Secondary | ICD-10-CM | POA: Diagnosis not present

## 2024-10-13 MED ORDER — CYANOCOBALAMIN 1000 MCG/ML IJ SOLN
1000.0000 ug | Freq: Once | INTRAMUSCULAR | Status: AC
  Administered 2024-10-13: 1000 ug via INTRAMUSCULAR

## 2024-10-13 NOTE — Progress Notes (Signed)
 Pt was given B12 injection w/o any complications at this time.

## 2024-10-17 ENCOUNTER — Ambulatory Visit: Payer: Self-pay

## 2024-10-17 NOTE — Progress Notes (Unsigned)
" ° ° °  Subjective:    Patient ID: James [name], male    DOB: [birth year], 79 y.o.   MRN: [id number]      HPI James [name] is here for No chief complaint on file.    Do EKG, ? echo    Medications and allergies reviewed with patient and updated if appropriate.  Medications Ordered Prior to Encounter[1]  Review of Systems     Objective:  There were no vitals filed for this visit. BP Readings from Last 3 Encounters:  07/22/24 121/72  07/07/24 122/78  06/23/24 120/74   Wt Readings from Last 3 Encounters:  07/22/24 144 lb (65.3 kg)  07/07/24 143 lb 9.6 oz (65.1 kg)  06/23/24 143 lb 9.6 oz (65.1 kg)   There is no height or weight on file to calculate BMI.    Physical Exam         Assessment & Plan:    See Problem List for Assessment and Plan of chronic medical problems.         [1]  Current Outpatient Medications on File Prior to Visit  Medication Sig Dispense Refill   acetaminophen  (TYLENOL ) 500 MG tablet Take 1,000 mg by mouth every 6 (six) hours as needed for mild pain.      Cholecalciferol (VITAMIN D3) 25 MCG (1000 UT) CAPS Take 1 capsule by mouth daily.     Cyanocobalamin  (VITAMIN B 12 PO) Take 1 capsule by mouth daily.     ferrous sulfate  325 (65 FE) MG EC tablet TAKE 1 TABLET BY MOUTH EVERY DAY WITH BREAKFAST 90 tablet 3   fluticasone  (FLONASE ) 50 MCG/ACT nasal spray Place 2 sprays into both nostrils 2 (two) times daily for 5 days. 9.9 mL 0   Multiple Vitamin (MULTIVITAMIN WITH MINERALS) TABS tablet Take 1 tablet by mouth every other day.  (Patient taking differently: Take 1 tablet by mouth daily.)     omeprazole  (PRILOSEC) 40 MG capsule TAKE 1 CAPSULE DAILY 30 MINS BEFORE BREAKFAST OR SUPPER, OKAY TO OPEN AND SPRINKLE ON APPLESAUCE 90 capsule 1   pravastatin  (PRAVACHOL ) 40 MG tablet TAKE 1 TABLET (40 MG TOTAL) BY MOUTH DAILY AT BEDTIME 90 tablet 1   No current facility-administered medications on file prior to visit.   "

## 2024-10-17 NOTE — Telephone Encounter (Signed)
 Copied from CRM #[id number]. Topic: Clinical - Red Word Triage >> Oct 17, 2024  3:25 PM Roselie BROCKS wrote: Red Word that prompted transfer to Nurse Triage: Patient states he is feeling extremely fatigued , does not feel good over all. Reason for Disposition  [1] MODERATE dizziness (e.g., interferes with normal activities) AND [2] has NOT been evaluated by doctor (or NP/PA) for this  (Exception: Dizziness caused by heat exposure, sudden standing, or poor fluid intake.)  Answer Assessment - Initial Assessment Questions Patient states that he is feeling very tired and fatigued. He also reports intermittent lightheadedness and diarrhea for the last week. He denies any other symptoms including signs of dehydration. Office visit advised.   1. DESCRIPTION: Describe your dizziness.     Lightheaded  2. LIGHTHEADED: Do you feel lightheaded? (e.g., somewhat faint, woozy, weak upon standing)     Yes  3. VERTIGO: Do you feel like either you or the room is spinning or tilting? (i.e., vertigo)     No  4. SEVERITY: How bad is it?  Do you feel like you are going to faint? Can you stand and walk?     Moderate  5. ONSET:  When did the dizziness begin?     About 1 week ago  6. AGGRAVATING FACTORS: Does anything make it worse? (e.g., standing, change in head position)     Unknown  7. HEART RATE: Can you tell me your heart rate? How many beats in 15 seconds?  (Note: Not all patients can do this.)       Unknown  8. CAUSE: What do you think is causing the dizziness? (e.g., decreased fluids or food, diarrhea, emotional distress, heat exposure, new medicine, sudden standing, vomiting; unknown)     Not sure  9. RECURRENT SYMPTOM: Have you had dizziness before? If Yes, ask: When was the last time? What happened that time?     No  10. OTHER SYMPTOMS: Do you have any other symptoms? (e.g., fever, chest pain, vomiting, diarrhea, bleeding)       Diarrhea-1 week  11. PREGNANCY: Is  there any chance you are pregnant? When was your last menstrual period?       NA  Protocols used: Dizziness - Lightheadedness-A-AH

## 2024-10-17 NOTE — Assessment & Plan Note (Signed)
 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

## 2024-10-18 ENCOUNTER — Ambulatory Visit: Admitting: Internal Medicine

## 2024-10-18 VITALS — BP 114/78 | HR 65 | Temp 98.4°F | Ht 66.0 in | Wt 146.0 lb

## 2024-10-18 DIAGNOSIS — R7303 Prediabetes: Secondary | ICD-10-CM

## 2024-10-18 DIAGNOSIS — K912 Postsurgical malabsorption, not elsewhere classified: Secondary | ICD-10-CM | POA: Diagnosis not present

## 2024-10-18 DIAGNOSIS — E782 Mixed hyperlipidemia: Secondary | ICD-10-CM | POA: Diagnosis not present

## 2024-10-18 DIAGNOSIS — R42 Dizziness and giddiness: Secondary | ICD-10-CM | POA: Diagnosis not present

## 2024-10-18 DIAGNOSIS — E538 Deficiency of other specified B group vitamins: Secondary | ICD-10-CM

## 2024-10-18 DIAGNOSIS — K219 Gastro-esophageal reflux disease without esophagitis: Secondary | ICD-10-CM | POA: Diagnosis not present

## 2024-10-18 DIAGNOSIS — Z903 Acquired absence of stomach [part of]: Secondary | ICD-10-CM | POA: Diagnosis not present

## 2024-10-18 DIAGNOSIS — J309 Allergic rhinitis, unspecified: Secondary | ICD-10-CM | POA: Diagnosis not present

## 2024-10-18 DIAGNOSIS — R5383 Other fatigue: Secondary | ICD-10-CM

## 2024-10-18 DIAGNOSIS — K909 Intestinal malabsorption, unspecified: Secondary | ICD-10-CM | POA: Insufficient documentation

## 2024-10-18 LAB — IBC PANEL
Iron: 62 ug/dL (ref 42–165)
Saturation Ratios: 17.3 % — ABNORMAL LOW (ref 20.0–50.0)
TIBC: 358.4 ug/dL (ref 250.0–450.0)
Transferrin: 256 mg/dL (ref 212.0–360.0)

## 2024-10-18 LAB — CBC WITH DIFFERENTIAL/PLATELET
Basophils Absolute: 0 K/uL (ref 0.0–0.1)
Basophils Relative: 0.8 % (ref 0.0–3.0)
Eosinophils Absolute: 0.2 K/uL (ref 0.0–0.7)
Eosinophils Relative: 4.7 % (ref 0.0–5.0)
HCT: 42.3 % (ref 39.0–52.0)
Hemoglobin: 14.3 g/dL (ref 13.0–17.0)
Lymphocytes Relative: 35.6 % (ref 12.0–46.0)
Lymphs Abs: 1.7 K/uL (ref 0.7–4.0)
MCHC: 33.7 g/dL (ref 30.0–36.0)
MCV: 95.5 fl (ref 78.0–100.0)
Monocytes Absolute: 0.5 K/uL (ref 0.1–1.0)
Monocytes Relative: 9.8 % (ref 3.0–12.0)
Neutro Abs: 2.3 K/uL (ref 1.4–7.7)
Neutrophils Relative %: 49.1 % (ref 43.0–77.0)
Platelets: 207 K/uL (ref 150.0–400.0)
RBC: 4.43 Mil/uL (ref 4.22–5.81)
RDW: 15.2 % (ref 11.5–15.5)
WBC: 4.8 K/uL (ref 4.0–10.5)

## 2024-10-18 LAB — COMPREHENSIVE METABOLIC PANEL WITH GFR
ALT: 6 U/L (ref 3–53)
AST: 18 U/L (ref 5–37)
Albumin: 4.2 g/dL (ref 3.5–5.2)
Alkaline Phosphatase: 60 U/L (ref 39–117)
BUN: 12 mg/dL (ref 6–23)
CO2: 29 meq/L (ref 19–32)
Calcium: 9 mg/dL (ref 8.4–10.5)
Chloride: 105 meq/L (ref 96–112)
Creatinine, Ser: 0.86 mg/dL (ref 0.40–1.50)
GFR: 82.14 mL/min
Glucose, Bld: 91 mg/dL (ref 70–99)
Potassium: 4.3 meq/L (ref 3.5–5.1)
Sodium: 140 meq/L (ref 135–145)
Total Bilirubin: 0.3 mg/dL (ref 0.2–1.2)
Total Protein: 6.8 g/dL (ref 6.0–8.3)

## 2024-10-18 LAB — VITAMIN D 25 HYDROXY (VIT D DEFICIENCY, FRACTURES): VITD: 18.3 ng/mL — ABNORMAL LOW (ref 30.00–100.00)

## 2024-10-18 LAB — FOLATE: Folate: >23.4 ng/mL

## 2024-10-18 LAB — FERRITIN: Ferritin: 15.9 ng/mL — ABNORMAL LOW (ref 22.0–322.0)

## 2024-10-18 LAB — MAGNESIUM: Magnesium: 2 mg/dL (ref 1.5–2.5)

## 2024-10-18 LAB — VITAMIN B12: Vitamin B-12: 1144 pg/mL — ABNORMAL HIGH (ref 211–911)

## 2024-10-18 LAB — HEMOGLOBIN A1C: Hgb A1c MFr Bld: 5.9 % (ref 4.6–6.5)

## 2024-10-18 LAB — TSH: TSH: 4.08 u[IU]/mL (ref 0.35–5.50)

## 2024-10-18 NOTE — Patient Instructions (Addendum)
      Blood work was ordered.       Medications changes include :   None      Return if symptoms worsen or fail to improve.

## 2024-10-18 NOTE — Assessment & Plan Note (Signed)
Chronic GERD controlled eat smaller meals Continue omeprazole 40 mg daily

## 2024-10-18 NOTE — Assessment & Plan Note (Signed)
Chronic Regular exercise and healthy diet encouraged Continue pravastatin 40 mg daily

## 2024-10-22 ENCOUNTER — Ambulatory Visit: Payer: Self-pay | Admitting: Internal Medicine

## 2024-10-24 LAB — VITAMIN B1: Vitamin B1 (Thiamine): 17 nmol/L (ref 8–30)

## 2024-11-16 ENCOUNTER — Ambulatory Visit

## 2025-04-12 ENCOUNTER — Ambulatory Visit

## 2025-07-06 ENCOUNTER — Ambulatory Visit: Admitting: Oncology

## 2025-07-06 ENCOUNTER — Other Ambulatory Visit
